# Patient Record
Sex: Male | Born: 1945
Health system: Southern US, Community
[De-identification: ages and names within clinical notes are randomized; demographics above are authoritative.]

## PROBLEM LIST (undated history)

## (undated) DIAGNOSIS — E039 Hypothyroidism, unspecified: Secondary | ICD-10-CM

## (undated) DIAGNOSIS — R519 Headache, unspecified: Secondary | ICD-10-CM

## (undated) DIAGNOSIS — G8929 Other chronic pain: Secondary | ICD-10-CM

## (undated) DIAGNOSIS — I509 Heart failure, unspecified: Secondary | ICD-10-CM

## (undated) DIAGNOSIS — G4733 Obstructive sleep apnea (adult) (pediatric): Secondary | ICD-10-CM

## (undated) DIAGNOSIS — M1990 Unspecified osteoarthritis, unspecified site: Secondary | ICD-10-CM

## (undated) DIAGNOSIS — F32A Depression, unspecified: Secondary | ICD-10-CM

## (undated) DIAGNOSIS — F329 Major depressive disorder, single episode, unspecified: Secondary | ICD-10-CM

## (undated) DIAGNOSIS — I2510 Atherosclerotic heart disease of native coronary artery without angina pectoris: Secondary | ICD-10-CM

## (undated) DIAGNOSIS — I10 Essential (primary) hypertension: Secondary | ICD-10-CM

## (undated) DIAGNOSIS — R51 Headache: Secondary | ICD-10-CM

## (undated) DIAGNOSIS — J189 Pneumonia, unspecified organism: Secondary | ICD-10-CM

## (undated) DIAGNOSIS — M545 Low back pain, unspecified: Secondary | ICD-10-CM

## (undated) DIAGNOSIS — S32000A Wedge compression fracture of unspecified lumbar vertebra, initial encounter for closed fracture: Secondary | ICD-10-CM

## (undated) DIAGNOSIS — E119 Type 2 diabetes mellitus without complications: Secondary | ICD-10-CM

## (undated) DIAGNOSIS — E7800 Pure hypercholesterolemia, unspecified: Secondary | ICD-10-CM

## (undated) DIAGNOSIS — K219 Gastro-esophageal reflux disease without esophagitis: Secondary | ICD-10-CM

## (undated) HISTORY — PX: CATARACT EXTRACTION W/ INTRAOCULAR LENS IMPLANT: SHX1309

## (undated) HISTORY — PX: APPENDECTOMY: SHX54

## (undated) HISTORY — PX: SPHINCTEROTOMY: SHX5279

## (undated) HISTORY — PX: TONSILLECTOMY AND ADENOIDECTOMY: SUR1326

## (undated) HISTORY — PX: EYE MUSCLE SURGERY: SHX370

## (undated) HISTORY — PX: CARDIAC CATHETERIZATION: SHX172

---

## 1969-01-10 HISTORY — PX: MASTOIDECTOMY: SHX711

## 1979-01-11 HISTORY — PX: CHOLECYSTECTOMY: SHX55

## 1999-03-11 ENCOUNTER — Ambulatory Visit (HOSPITAL_COMMUNITY): Admission: RE | Admit: 1999-03-11 | Discharge: 1999-03-11 | Payer: Self-pay | Admitting: *Deleted

## 1999-12-25 ENCOUNTER — Observation Stay (HOSPITAL_COMMUNITY): Admission: EM | Admit: 1999-12-25 | Discharge: 1999-12-26 | Payer: Self-pay | Admitting: Internal Medicine

## 1999-12-26 ENCOUNTER — Encounter: Payer: Self-pay | Admitting: *Deleted

## 1999-12-26 ENCOUNTER — Encounter: Payer: Self-pay | Admitting: Internal Medicine

## 2001-05-30 ENCOUNTER — Ambulatory Visit (HOSPITAL_BASED_OUTPATIENT_CLINIC_OR_DEPARTMENT_OTHER): Admission: RE | Admit: 2001-05-30 | Discharge: 2001-05-30 | Payer: Self-pay | Admitting: Family Medicine

## 2005-10-11 ENCOUNTER — Inpatient Hospital Stay (HOSPITAL_COMMUNITY): Admission: EM | Admit: 2005-10-11 | Discharge: 2005-10-14 | Payer: Self-pay | Admitting: Emergency Medicine

## 2007-12-24 ENCOUNTER — Ambulatory Visit: Payer: Self-pay | Admitting: Family Medicine

## 2007-12-24 ENCOUNTER — Ambulatory Visit: Payer: Self-pay | Admitting: Cardiology

## 2007-12-24 ENCOUNTER — Inpatient Hospital Stay (HOSPITAL_COMMUNITY): Admission: EM | Admit: 2007-12-24 | Discharge: 2007-12-27 | Payer: Self-pay | Admitting: Emergency Medicine

## 2008-04-03 ENCOUNTER — Telehealth: Payer: Self-pay | Admitting: *Deleted

## 2008-09-11 ENCOUNTER — Encounter: Admission: RE | Admit: 2008-09-11 | Discharge: 2008-09-11 | Payer: Self-pay | Admitting: Family Medicine

## 2010-05-24 ENCOUNTER — Ambulatory Visit (HOSPITAL_COMMUNITY)
Admission: RE | Admit: 2010-05-24 | Discharge: 2010-05-24 | Payer: Self-pay | Source: Home / Self Care | Attending: Specialist | Admitting: Specialist

## 2010-05-27 LAB — CBC
HCT: 37.2 % — ABNORMAL LOW (ref 39.0–52.0)
Hemoglobin: 13.2 g/dL (ref 13.0–17.0)
MCH: 31 pg (ref 26.0–34.0)
MCHC: 35.5 g/dL (ref 30.0–36.0)
MCV: 87.3 fL (ref 78.0–100.0)
Platelets: 233 10*3/uL (ref 150–400)
RBC: 4.26 MIL/uL (ref 4.22–5.81)
RDW: 13.3 % (ref 11.5–15.5)
WBC: 6.6 10*3/uL (ref 4.0–10.5)

## 2010-05-27 LAB — BASIC METABOLIC PANEL
BUN: 12 mg/dL (ref 6–23)
CO2: 27 mEq/L (ref 19–32)
Calcium: 10 mg/dL (ref 8.4–10.5)
Chloride: 102 mEq/L (ref 96–112)
Creatinine, Ser: 1.27 mg/dL (ref 0.4–1.5)
GFR calc Af Amer: >60 mL/min (ref >60)
GFR calc non Af Amer: 57 mL/min — ABNORMAL LOW (ref >60)
Glucose, Bld: 121 mg/dL — ABNORMAL HIGH (ref 70–99)
Potassium: 4.1 mEq/L (ref 3.5–5.1)
Sodium: 138 mEq/L (ref 135–145)

## 2010-07-08 ENCOUNTER — Encounter (HOSPITAL_COMMUNITY): Payer: Worker's Compensation | Attending: Specialist

## 2010-07-08 ENCOUNTER — Other Ambulatory Visit: Payer: Self-pay | Admitting: Specialist

## 2010-07-08 DIAGNOSIS — Z01812 Encounter for preprocedural laboratory examination: Secondary | ICD-10-CM | POA: Insufficient documentation

## 2010-07-08 LAB — BASIC METABOLIC PANEL
BUN: 10 mg/dL (ref 6–23)
CO2: 28 mEq/L (ref 19–32)
Calcium: 9.7 mg/dL (ref 8.4–10.5)
Chloride: 102 mEq/L (ref 96–112)
Creatinine, Ser: 1.1 mg/dL (ref 0.4–1.5)
GFR calc Af Amer: >60 mL/min (ref >60)
GFR calc non Af Amer: >60 mL/min (ref >60)
Glucose, Bld: 233 mg/dL — ABNORMAL HIGH (ref 70–99)
Potassium: 3.8 mEq/L (ref 3.5–5.1)
Sodium: 139 mEq/L (ref 135–145)

## 2010-07-08 LAB — CBC
HCT: 44.4 % (ref 39.0–52.0)
Hemoglobin: 15.2 g/dL (ref 13.0–17.0)
MCH: 30 pg (ref 26.0–34.0)
MCHC: 34.2 g/dL (ref 30.0–36.0)
MCV: 87.7 fL (ref 78.0–100.0)
Platelets: 202 10*3/uL (ref 150–400)
RBC: 5.06 MIL/uL (ref 4.22–5.81)
RDW: 13.3 % (ref 11.5–15.5)
WBC: 5.3 10*3/uL (ref 4.0–10.5)

## 2010-07-11 ENCOUNTER — Observation Stay (HOSPITAL_COMMUNITY)
Admission: RE | Admit: 2010-07-11 | Discharge: 2010-07-12 | Disposition: A | Discharge status: Home / Self Care | Payer: Worker's Compensation | Source: Ambulatory Visit | Attending: Specialist | Admitting: Specialist

## 2010-07-11 DIAGNOSIS — M719 Bursopathy, unspecified: Principal | ICD-10-CM | POA: Insufficient documentation

## 2010-07-11 DIAGNOSIS — I2510 Atherosclerotic heart disease of native coronary artery without angina pectoris: Secondary | ICD-10-CM | POA: Insufficient documentation

## 2010-07-11 DIAGNOSIS — M25819 Other specified joint disorders, unspecified shoulder: Secondary | ICD-10-CM | POA: Insufficient documentation

## 2010-07-11 DIAGNOSIS — I10 Essential (primary) hypertension: Secondary | ICD-10-CM | POA: Insufficient documentation

## 2010-07-11 DIAGNOSIS — E669 Obesity, unspecified: Secondary | ICD-10-CM | POA: Insufficient documentation

## 2010-07-11 DIAGNOSIS — K219 Gastro-esophageal reflux disease without esophagitis: Secondary | ICD-10-CM | POA: Insufficient documentation

## 2010-07-11 DIAGNOSIS — M24119 Other articular cartilage disorders, unspecified shoulder: Secondary | ICD-10-CM | POA: Insufficient documentation

## 2010-07-11 DIAGNOSIS — M67919 Unspecified disorder of synovium and tendon, unspecified shoulder: Principal | ICD-10-CM | POA: Insufficient documentation

## 2010-07-11 DIAGNOSIS — M7500 Adhesive capsulitis of unspecified shoulder: Secondary | ICD-10-CM | POA: Insufficient documentation

## 2010-07-11 HISTORY — PX: SHOULDER OPEN ROTATOR CUFF REPAIR: SHX2407

## 2010-07-11 LAB — GLUCOSE, CAPILLARY
Glucose-Capillary: 268 mg/dL — ABNORMAL HIGH (ref 70–99)
Glucose-Capillary: 240 mg/dL — ABNORMAL HIGH (ref 70–99)
Glucose-Capillary: 209 mg/dL — ABNORMAL HIGH (ref 70–99)
Glucose-Capillary: 208 mg/dL — ABNORMAL HIGH (ref 70–99)
Glucose-Capillary: 150 mg/dL — ABNORMAL HIGH (ref 70–99)

## 2010-07-12 LAB — CBC
HCT: 41.7 % (ref 39.0–52.0)
Hemoglobin: 14.2 g/dL (ref 13.0–17.0)
MCH: 30.1 pg (ref 26.0–34.0)
MCHC: 34.1 g/dL (ref 30.0–36.0)
MCV: 88.5 fL (ref 78.0–100.0)
Platelets: 164 10*3/uL (ref 150–400)
RBC: 4.71 MIL/uL (ref 4.22–5.81)
RDW: 13.2 % (ref 11.5–15.5)
WBC: 9.8 10*3/uL (ref 4.0–10.5)

## 2010-07-12 LAB — BASIC METABOLIC PANEL
BUN: 12 mg/dL (ref 6–23)
CO2: 26 mEq/L (ref 19–32)
Calcium: 8.8 mg/dL (ref 8.4–10.5)
Chloride: 103 mEq/L (ref 96–112)
Creatinine, Ser: 1.14 mg/dL (ref 0.4–1.5)
GFR calc Af Amer: >60 mL/min (ref >60)
GFR calc non Af Amer: >60 mL/min (ref >60)
Glucose, Bld: 181 mg/dL — ABNORMAL HIGH (ref 70–99)
Potassium: 4 mEq/L (ref 3.5–5.1)
Sodium: 135 mEq/L (ref 135–145)

## 2010-07-12 LAB — GLUCOSE, CAPILLARY: Glucose-Capillary: 161 mg/dL — ABNORMAL HIGH (ref 70–99)

## 2010-07-19 NOTE — Op Note (Signed)
NAME:  Joshua Lara, Joshua Lara               ACCOUNT NO.:  [id number]  MEDICAL RECORD NO.:  [id number]           PATIENT TYPE:  O  LOCATION:  DAYL                         FACILITY:  [facility]  PHYSICIAN:  Jene Every, M.D.    DATE OF BIRTH:  May 19, 1945  DATE OF PROCEDURE:  07/11/2010 DATE OF DISCHARGE:                              OPERATIVE REPORT   PREOPERATIVE DIAGNOSIS:  Rotator cuff tear and impingement syndrome, left shoulder.  POSTOPERATIVE DIAGNOSES: 1. Rotator cuff tear and impingement syndrome, left shoulder. 2. Adhesive capsulitis.  PROCEDURES PERFORMED: 1. Exam under anesthesia followed by manipulation under anesthesia. 2. Left shoulder arthroscopy and debridement of anterior labral tear. 3. Subacromial decompression with mini open rotator cuff repair     utilizing Mitek suture anchors and push locks.  ANESTHESIA:  General.  ASSISTANT:  Roma Schanz, P.A.  BRIEF HISTORY:  A 65 year old with rotator cuff tear, acquired clearance, indicated for surgical intervention for repair.  Risk and benefits discussed including bleeding, infection, damage to vascular structures, no change in symptoms, worsening in symptoms, need for repeat debridement, DVT, PE, and anesthetic complications, etc.  TECHNIQUE:  The patient in supine position; after induction of adequate general esthesia and 2 g Kefzol and 600 clindamycin, the left shoulder and upper extremity was prepped and draped in usual sterile fashion. After exam under anesthesia revealed restricted forward flexion, we manipulated from 120 degrees to 140 degrees with appreciation of lysis of adhesions which was performed gently.  Abduction and internal- external rotation were unremarkable.  Surgical marker was utilized to delineate acromion and AC joint coracoid.  With the arm in 70/30 position with gentle traction, we advanced the arthroscopic cannula in the glenohumeral joint penetrating atraumatically.  The anterior  portal was then fashioned, localized with an 18 gauge needle anteriorly just beneath the biceps tendon in the glenohumeral joint.  Small incision was made there.  We advanced the cannula into the glenohumeral joint. Inspection of the glenohumeral joint revealed tearing of the anterior labrum; however, it was intact.  The biceps tendon was intact.  There appeared to be a full-thickness tear from the rotator cuff.  Mild degenerative changes of glenohumeral joint.  Subscap was unremarkable as well.  Labrum was probed.  It was attached.  Introduced a shaver and debrided the labrum to a stable base.  Following this, we redirected the camera in the subacromial space triangulating with an anterolateral cannula.  Exuberant hypertrophic bursa was noted.  We therefore introduced a shaver and performed a bursectomy.  CA ligament was divided utilizing the ArthroWand and debriding the anterolateral aspect of the acromion.  Full-thickness tear was identified.  We converted therefore to a mini open procedure.  Arthroscopic camera and equipment was removed.  Portals were closed with 4-0 nylon simple suture.  A small 2 cm incision was made over the anterolateral aspect of the acromion. Self-retaining retractors were placed.  Marcaine with epinephrine was infiltrated in the joint.  We developed a raphe between the anterolateral heads of the acromion.  Self-retaining retractor was then placed.  We preserved the attachment of the deltoid, subperiosteal elevation with the AO elevator.  Noted was a full-thickness tear and the rotator cuff retracted a centimeter.  We debrided the edges, mobilized the rotator cuff beneath it at the glenoid and between that and the acromion with mobilization of the arm at its side to the lateral aspect of the greater tuberosity.  A Baer rongeur was utilized to fashion a bone trough to good bleeding bone just lateral to the articular surface medial to the greater tuberosity.  Then  with the arm slightly abducted, we placed 2 Mitek suture anchors in this trough.  This was just at the lateral margin of the articular surface guided through the supraspinatus tendon which was torn and retracted.  Surgical knot utilized as we threaded it through the tendon advancing into the greater tuberosity. Following this, the leading edges of the suture were crossed and secured over the greater tuberosity with 2 push locks that were inserted after utilization of an awl threading the Ethibond suture and impacting it to the greater tuberosity.  Excellent purchase was noted and full coverage of the tendon and repair to the bony bed was achieved.  Watertight closure was noted.  We then attained the remainder of the cuff nor evidence of tear.  We digitally lysed subacromial adhesions.  Residual bursectomy was performed as well.  This was performed previously with the arthroscope, as it was released into the CA ligament.  Next, wound copiously irrigated with antibiotic irrigation.  The raphe with #1 Vicryl interrupted figure-of-8 sutures, subcutaneous with 2-0 Vicryl simple sutures.  Skin was reapproximated with 4-0 subcuticular Prolene.Wound reinforced with Steri-Strips.  Sterile dressing applied, placed an abduction pillow, extubated without difficulty, transported to recovery in satisfactory condition.  The patient tolerated procedure well.  No complications.  ESTIMATED BLOOD LOSS:  Minimal.     Jene Every, M.D.     Cordelia Pen  D:  07/11/2010  T:  07/11/2010  Job:  [id number]  Electronically Signed by Jene Every M.D. on 07/19/2010 05:50:04 AM

## 2010-09-09 ENCOUNTER — Other Ambulatory Visit: Payer: Self-pay | Admitting: Physician Assistant

## 2010-09-09 ENCOUNTER — Ambulatory Visit
Admission: RE | Admit: 2010-09-09 | Discharge: 2010-09-09 | Disposition: A | Discharge status: Home / Self Care | Payer: Worker's Compensation | Source: Ambulatory Visit | Attending: Physician Assistant | Admitting: Physician Assistant

## 2010-09-09 DIAGNOSIS — R609 Edema, unspecified: Secondary | ICD-10-CM

## 2010-09-09 DIAGNOSIS — R52 Pain, unspecified: Secondary | ICD-10-CM

## 2010-09-24 NOTE — Consult Note (Signed)
NAME:  ADEN, SEK               ACCOUNT NO.:  [id number]   MEDICAL RECORD NO.:  [id number]          PATIENT TYPE:  INP   LOCATION:  2001                         FACILITY:  [facility]   PHYSICIAN:  Jesse Sans. Wall, MD, FACCDATE OF BIRTH:  1945/12/22   DATE OF CONSULTATION:  DATE OF DISCHARGE:                                 CONSULTATION   We were asked by the [facility] Teaching Service, specifically Dr.  Burnadette Pop to consult on Joshua Lara with exertional chest discomfort.   Joshua Lara is 65 years of age, married white male with history of nonobstructive  disease by cardiac catheterization in 2001.   Over the last week, Joshua L[place] has been having exertional chest tightness going  up into Joshua [place]ara left shoulder down Joshua [place]ara left arm to [place] elbow.  It is  relieved with rest.  Josh[place] also has some sharp pain off and on that  radiates back-and-forth from the left side of Joshua Lara chest to the right  side of Joshua [place]ara chest.  Josh[place] has had no prolonged pain.  Josh[place] does get low short  of breath and sweaty.  Pain is relieved by rest.   Joshua Lara has no known drug allergies.   Joshua Lara is currently on  1. Cymbalta 120 mg a day.  2. Testosterone monthly.  3. Ramipril 10 mg a day.  4. Janumet 50/1000 p.o. b.i.d.  5. Vytorin 10/40 p.o. daily.  6. Lovaza 1000 mg daily.  7. Lyrica 200 mg daily.  8. Flomax 0.4 mg a day.  9. Hydrochlorothiazide 25 mg a day.  10.Aspirin 81 mg a day.   Joshua Lara past medical history includes  1. Type 2 diabetes.  2. Mixed hyperlipidemia.  3. Hypertension.  4. Tobacco use which Joshua Lara quit 18 years ago.  5. Glaucoma.  6. BPH.   PAST SURGICAL HISTORY:  Appendectomy, cholecystectomy, tonsillectomy,  glaucoma surgery.   SOCIAL HISTORY:  [place] lives in [place] with [place].  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  Joshua Lara has two children and several grandchildren.  Joshua [place] enjoys  riding [place] [employer].  Joshua [place]ara enjoys yard work.   FAMILY HISTORY:  Joshua [place]ara mother had heart disease.  Joshua [place] father died of an MI  at age 74.  There maybe a hidden  history in [place] brother.   [place] review of systems other than HPI, Joshua [place]ara has chronic dyspnea on  exertion.  Joshua Lara has some bright red blood per rectum which [place] thinks is  from hemorrhoids.   The rest of the review of systems were questioned and were negative.   PHYSICAL EXAMINATION:  VITAL SIGNS:  Joshua [place]ara blood pressure is currently  120/80, [place] pulse is 83 and regular, [place] temperature is 98.9, sats 90%  on 4 L.  GENERAL:  Joshua [place] is an overweight gentleman in no acute distress.  Josh[place] is  fairly jocular, has a pleasant affect.  [place] is alert and oriented x3.  [place] is in the room.  HEENT:  Extraocular movements intact.  PERRLA.  Sclerae clear.  Josh[place] has  some false teeth.  Facial symmetry is normal.  NECK:  Supple.  No JVD.  Good carotid upstrokes without bruits.  CARDIOVASCULAR:  Regular rate and rhythm without murmur, rub or gallop.  Joshua [place]ara has distant S1-S2.  LUNGS:  Clear to auscultation and percussion.  ABDOMEN:  Obese, soft, nontender, good bowel sounds and no  hepatosplenomegaly.  EXTREMITIES:  No cyanosis, clubbing or edema.  Pulses were 2+/ 4+ both  pedal and radial pulses.  NEURO:  Grossly intact.   LABORATORY DATA:  [place] hemoglobin is 15.3, D-dimer was less than 0.22,  point of care markers were negative x2.  Chest x-ray shows some mild  cardiomegaly.  A CT angio, no pulmonary embolus.  Joshua [place]ara potassium is 3.8  and creatinine 0.96.   EKG shows sinus rhythm and right axis deviation.  No ST-segment changes.  No change since 2001.   ASSESSMENT AND PLAN:  1. Exertional angina.  2. Hyperlipidemia.  3. Hypertension.  4. Type 2 diabetes.  5. Obesity.  6. Remote tobacco.   RECOMMENDATIONS:  1. Intravenous heparin.  2. Aspirin.  3. Continue statin.  4. Beta blockade.  5. Telemetry.  6. Cardiac markers.  7. Cath Monday.   Indications, risks, and potential benefits have been discussed.  [place]  agrees to proceed.      Thomas C. Daleen Squibb, MD, [facility]  Electronically Signed     TCW/MEDQ  D:   12/24/2007  T:  12/25/2007  Job:  [id number]   cc:   Lianne Bushy, M.D.

## 2010-09-24 NOTE — Discharge Summary (Signed)
NAME:  Joshua Lara, Joshua Lara               ACCOUNT NO.:  [id number]   MEDICAL RECORD NO.:  [id number]          PATIENT TYPE:  INP   LOCATION:  2001                         FACILITY:  [facility]   PHYSICIAN:  Wayne A. Sheffield Lara, M.D.    DATE OF BIRTH:  November 23, 1945   DATE OF ADMISSION:  12/24/2007  DATE OF DISCHARGE:  12/27/2007                               DISCHARGE SUMMARY   PRIMARY CARE Mirren Gest:  Lianne Bushy, MD   DISCHARGE DIAGNOSES:  1. Coronary artery disease.  2. Diabetes mellitus type 2.  3. Hyperlipidemia.  4. Hypertension.  5. Glaucoma.  6. Benign prostatic hypertrophy.  7. History of tobacco abuse.   DISCHARGE MEDICATIONS:  1. Omeprazole 20 mg 1 tab by mouth daily.  2. Aspirin 81 mg 1 tab by mouth daily.  3. Coreg 3.125 mg 1 tab by mouth twice a day with meals.  4. Cymbalta 60 mg 1 tab by mouth twice a day.  5. Vytorin 10/40 mg 1 tab by mouth daily.  6. Hydrochlorothiazide 25 mg 1 tab by mouth daily.  7. Lovaza 1g 1 tab by mouth daily.  8. Lyrica 50 mg 1 tab by mouth 4 times a day.  9. Ramipril 10 mg 1 tab by mouth once at night.  10.Janumet 50/1000 mg 1 tab by mouth twice a day (do not take for the      next 2 days).  11.Flomax 0.4 mg 1 tab by mouth once at night.   CONSULT:  Cardiology.   PROCEDURES:  1. EKG:  Right axis deviation, no ST- or T-wave changes.  2. Chest x-ray:  Mild cardiomegaly.  No edema.  3. CT angiogram:  No pulmonary emboli.  4. Cardiac cath:  40% stenosis of the LAD.   LABS:  1. D-dimer less than 0.22.  2. Hemoglobin A1c 6%.  3. Fasting lipid profile, cholesterol 105, triglycerides 167, HDL 36,      LDL 36.  4. Cardiac enzymes negative x2.   BRIEF HOSPITAL COURSE:  The patient is a 65 year old male with multiple  risk factors admitted for chest pain and a rule out MI.  1. Chest pain:  The patient has a good story for coronary artery      disease who complained of substernal chest pain radiating down to      the left arm, that is brought down with  exertion, relieved by rest,      and relieved with nitroglycerin.  EKG showed only right axis      deviation, but no ST or T-wave changes.  Cardiology was consulted      who felt that this patient was at high enough risk for coronary      artery disease and would benefit from a cardiac cath.  After being      admitted, the patient did well.  He complained of no further      episodes of chest pain.  Cardiac cath revealed 40% stenosis at the      mid LAD, but Cardiology did not feel it was amenable to      intervention.  Cardiology recommended  medical management and      primary risk reduction.  The patient was started on Coreg, and was      also given aspirin, ramipril, and Vytorin.  2. Hypertension:  This was well controlled in the hospital.  The      patient was continued on his home medications and started on Coreg.  3. Diabetes mellitus type 2:  The patient's blood sugar is well      controlled in the hospital.  The patient was given Januvia with      sliding-scale insulin.  On discharge, the patient was told to      restart Janumet, but it hold it for 48 hours after the cath.  4. Disposition:  The patient did well in the hospital and after      cardiac cath was determined that there would be no intervention and      only medical management.  The patient was ready for discharge.  On      the day of discharge, the patient was not complaining of any      further chest pain and was agreeable in a complete understanding      with discharge.   DISCHARGE INSTRUCTIONS:  The patient is to eat a low-sodium, heart-  healthy diet.  The patient is to increase activity slowly.   FOLLOWUP APPOINTMENTS:  The patient is to return to Dr. Purnell Shoemaker, phone  #[phone] on January 10, 2008, at 10:15 a.m.  The patient is also to  follow up with [facility] Cardiology and is to call and schedule an  appointment.   DISCHARGE CONDITION:  The patient was discharged to home in stable  medical condition.       Angelena Sole, MD  Electronically Signed      [place], M.D.  Electronically Signed    WS/MEDQ  D:  12/27/2007  T:  12/28/2007  Job:  [id number]

## 2010-09-24 NOTE — H&P (Signed)
NAME:  Joshua Lara, Joshua Lara               ACCOUNT NO.:  [id number]   MEDICAL RECORD NO.:  [id number]          PATIENT TYPE:  EMS   LOCATION:  MAJO                         FACILITY:  [facility]   PHYSICIAN:  Wayne A. Sheffield Slider, M.D.    DATE OF BIRTH:  09-10-1945   DATE OF ADMISSION:  12/24/2007  DATE OF DISCHARGE:                              HISTORY & PHYSICAL   PRIMARY CARE PHYSICIAN:  Lianne Bushy, M.D.   CHIEF COMPLAINT:  Chest pain, radiating arm pain.   HISTORY OF PRESENT ILLNESS:  A 65 year old male with significant risk  factors for coronary artery disease who presents to [facility] ED with  complaint of radiating left arm pain and chest discomfort that has been  worsening over the past week.  He is brought on with exertion, began as  sharp pain in the left shoulder and radiates to the elbow, and then  crossed the chest to the right arm.  It lasts for few minutes less than  30 minutes, relieved by rest.  Associated with shortness of breath and  diaphoresis.  No nausea or vomiting.  He was given nitro in the ED that  brought his pain down from a 8/10 to 1/10.   PAST MEDICAL HISTORY:  1. Diabetes, type 2.  2. Hyperlipidemia.  3. Hypertension.  4. Tobacco abuse.  He did quit 18 years ago.  5. Glaucoma.  6. BPH.   Please note that he had a cardiac cath in 2001 and had normal coronaries  and ejection fraction of 50%.   PAST SURGICAL HISTORY:  1. Appendicitis.  2. Cholecystectomy.  3. Tonsillectomy.  4. Glaucoma surgery.   ALLERGIES:  No known drug allergies.   MEDICATIONS:  1. Cymbalta 60 mg 2 tablets p.o. daily.  2. Testosterone monthly.  3. Ramipril 10 mg p.o. daily.  4. Janumet 50/1000 one tablet p.o. b.i.d.  5. Vytorin 10/40 one tablet p.o. daily.  6. Lovaza 1000 mg daily.  7. Lyrica 200 mg daily.  He takes less than 4 tablets of 50 mg.  8. Flomax 0.4 mg daily.  9. Hydrochlorothiazide 25 mg p.o. daily.  10.Aspirin 81 mg p.o. daily.   SOCIAL HISTORY:  Lives with his wife  in [place].  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  He does have a  smoking exposure, his wife is a smoker.  He rarely drinks alcohol and  has no drug abuse.   FAMILY HISTORY:  Mother has heart disease.  Father died at the age of 50  from MI and he thinks that he has a brother who also had MI in the past,  but is uncertain.   REVIEW OF SYSTEMS:  Denies any fevers, chills, edema, sputum production,  hemoptysis, nausea, vomiting, diarrhea, dysuria, hematuria, or vision  changes.  He endorses diaphoresis, chest pain, cough, dyspnea, bright  red blood per rectum at times and that is felt is occurred by  hemorrhoids and he also endorses some weakness.   PHYSICAL EXAMINATION:  VITAL SIGNS:  Temperature is 98.9, pulse 83,  blood pressure  98/48, he sats 98% on 4 liters, breathing 23 times a  minute.  GENERAL:  Obese white male in no apparent distress.  HEENT:  Extraocular movements intact.  Pupils equal, round, and reactive  to light and accommodation.  False dentures in place.  NECK:  No JVD.  No bruits.  CARDIOVASCULAR:  Regular rate and rhythm.  No murmurs, rubs, or gallops.  Distinct S1 and S2.  No abdominal bruits.  LUNGS:  Clear to auscultation bilaterally.  ABDOMEN:  Obese, soft, nontender, and nondistended.  Positive bowel  sounds.  No hepatosplenomegaly.  EXTREMITIES:  No edema, 2+ pedal/radial pulses.  NEURO:  Cranial nerves II-XII grossly intact, 5/5 strength in all  extremities and normal sensation.   LABS AND STUDIES:  CBC were within normal limits with a hemoglobin of  15.2, white blood cell count 8.5, and platelets 217.  Sodium 136,  potassium 3.8, creatinine 0.96, and glucose of 91.  He had a D-dimer  that was less than 0.22 with a CT angiogram that was negative for Pes.  Point of care enzymes were negative x2.  EKG showed normal sinus rhythm,  right axis deviation, no ST or T wave changes compared to EKG done to in  2001.  Chest x-ray  did show some mild cardiomegaly but no edema.   ASSESSMENT/PLAN:  This is a 65 year old white male with atypical chest  pain admitted for rule out acute coronary syndrome.  1. Chest pain.  Atypical in description but could very well be acute      coronary syndrome given his risk factors and worsened with exertion      improved with less and nitro.  His EKG and point of care enzymes      were negative.   PLAN:  Our plan is to cycle his cardiac enzymes, obtain EKG in the  morning, place on telemetry, obtain fasting lipid panels, continue on  home aspirin at a higher dose, continue his home hydrochlorothiazide and  ramipril, we will add a beta blocker provide O2 and nitroglycerin and  morphine as needed.  We will rule out a PE with a negative CT angio.  Chest x-ray was negative for dissection or infectious process.  We will  place him on PPI and cardiology has been consulted per [place].  1. Hypertension.  We will continue on his home medicine, add a beta      blocker for mortality benefit.  We will hold for systolic blood      pressure less than 100.  2. Hyperlipidemia.  Currently, he is on Vytorin, we will continue with      that medication and check his fasting lipid panel.  3. Diabetes, type 2.  We will continue him on Janumet, place him on a      sliding scale insulin moderate scale, and check A1c.  4. Benign prostatic hypertrophy, will continue on the Flomax, but we      will hold if his systolic blood pressures are less than 100 or if      he is symptomatic.  5. Prophylaxis.  We encouraged him to ambulate, place him on proton      pump inhibitor.  We will give him pain control with home meds and      also Tylenol.  6. Fluids, electrolytes, nutrition.  We will place him on IV fluids      and normal saline at 100 per hour.  We will place him on n.p.o. for  now until he is seen by Cardiology.  7. Disposition.  He is a full code.  Place him on op status and await      cardiology  recommendations.      Marisue Ivan, MD  Electronically Signed      Arnette Norris. Sheffield Slider, M.D.  Electronically Signed    KL/MEDQ  D:  12/24/2007  T:  12/25/2007  Job:  [id number]

## 2010-09-24 NOTE — Cardiovascular Report (Signed)
NAME:  Joshua Lara, Joshua Lara NO.:  [id number]   MEDICAL RECORD NO.:  [id number]          PATIENT TYPE:  INP   LOCATION:  2001                         FACILITY:  [facility]   PHYSICIAN:  Rollene Rotunda, MD, FACCDATE OF BIRTH:  [birth year]   DATE OF PROCEDURE:  12/27/2007  DATE OF DISCHARGE:                            CARDIAC CATHETERIZATION   PRIMARY CARE PHYSICIAN:  Lianne Bushy, MD.   PROCEDURE:  Left heart catheterization/coronary arteriography.   INDICATIONS:  The patient with chest pain and multiple cardiovascular  risk factors (411.1).   PROCEDURE NOTE:  Left heart catheterization performed via the right  femoral artery.  The aorta was cannulated using the anterior wall  puncture and a #6-French arterial sheath was inserted via the modified  Seldinger technique.  Preformed Judkins and pigtail catheter were  utilized.  The patient tolerated procedure well and left the lab in  stable condition.   RESULTS:  Hemodynamics:  LV 117/12, AO 118/75.  Coronaries, left main  was normal.  The LAD had mild diffuse luminal irregularities.  There is  mid long 40% stenosis.  There was a large vessel wrapping the apex.  Circumflex in the AV groove was normal.  There was a ramus intermediate,  which was large and normal.  First obtuse marginal was large branching  and normal.  The right coronary artery is dominant vessel.  There is  diffuse luminal irregularities.  PDA was large and normal.  Posterolateral was small and normal.  Left ventriculogram:  The left ventriculogram was obtained in the RAO  projection.  The EF was 55% with normal wall motion.   CONCLUSION:  Coronary artery:  Mild coronary artery plaque.  Low normal  left ventricular ejection fraction.   PLAN:  Based on the above, no further cardiovascular testing is  suggested.  He needs aggressive risk reduction given the 40% LAD plaque.      Rollene Rotunda, MD, [facility]  Electronically Signed     JH/MEDQ  D:   12/27/2007  T:  12/28/2007  Job:  [id number]   cc:   Lianne Bushy, M.D.
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
## 2010-09-27 NOTE — Discharge Summary (Signed)
[place]. [facility]  Patient:    RANDLE, SHATZER                        MRN: [id number] Adm. Date:  12/25/99 Disc. Date: 12/26/99 Attending:  Nathen May, M.D., [facility] Dictator:   Rozell Searing, P.A. CC:         Maricela Bo, M.D.                           Discharge Summary  PROCEDURE:  Cardiac catheterization, December 26, 1999.  Ventilation/perfusion scan December 26, 1999.  REASON FOR ADMISSION:  Mr. Amescua is a 65 year old male with no previous history of heart disease, and with cardiac risk factors notable for family history and obesity, who was referred to our office for cardiac evaluation by Dr. Placido Sou.  The patient was seen by Dr. Odessa Fleming who diagnosed the patient with typical/atypical chest discomfort and recommended proceeding with direct admission for rule out myocardial infarction and further diagnostic evaluation by way of coronary angiography.  Please refer to dictated admission note for full details.  LABORATORY DATA:  Room air ABG; pH 7.42, pCO2 38, pO2 83, HCO3 24.  Metabolic profile normal.  Elevated SGOT at 57.  Cardiac enzymes; CPK 287/1.7, 269/1.6. Troponin I; 0.05, 0.03.  CBC within normal limits.  D-dimer 0.37.  Admission chest x-ray; NAD.  HOSPITAL COURSE:  Following admission, the patient ruled out for MI with negative serial cardiac enzymes.  The patient was maintained on aspirin, long-acting nitrates, beta blocker, and low molecular weight heparin in preparation for diagnostic coronary angiogram.  Cardiac catheterization performed December 26, 1999, by Dr. Ephraim Hamburger, revealed normal coronary arteries and normal left ventricle.  There was, however, indication of slow flow down the LAD.  Dr. Juanda Chance recommended with proceeding with follow-up ventilation/perfusion scan to rule out pulmonary embolus.  This was interpreted as normal.  As previously noted, D-dimer was also within normal range.  MEDICATIONS ADJUSTMENTS:   Initiation of low dose aspirin on a daily basis. The patient is otherwise to resume previous home medication regimen.  DISCHARGE MEDICATIONS: 1. Aspirin 81 mg q.d. 2. Eye drops as previously directed. 3. BuSpar 10 mg b.i.d. 4. Prevacid 30 mg q.d. as needed.  DISCHARGE INSTRUCTIONS:  The patient is to refrain from any strenuous activity/driving x 2 days.  He is to call the office if there is any swelling/bleeding of the groin.  He is to maintain a low fat/cholesterol diet.  FOLLOW-UP:  The patient is instructed to schedule a follow-up appointment with his primary care physician, Dr. Kathrynn Humble, in the ensuing one-two weeks.  DISCHARGE DIAGNOSES: 1. Persistent chest pain - undetermined etiology.    a. Cardiac catheterization, December 26, 1999; normal coronary arteries with       decreased left anterior descending flow; normal left ventricle.    b. Normal ventilation/perfusion scan. 2. Obesity. 3. Remote tobacco. 4. Family history of coronary artery disease. DD:  12/26/99 TD:  12/27/99 Job: [id number]

## 2010-09-27 NOTE — Cardiovascular Report (Signed)
[place]. [facility]  Patient:    Joshua Lara, Joshua Lara                      MRN: [id number] Proc. Date: 12/26/99 Adm. Date:  [id number] Disc. Date: [id number] Attending:  Nathen May CC:         Maricela Bo, M.D.             Nathen May, M.D., [facility]             [facility]                        Cardiac Catheterization  CLINICAL HISTORY:  Mr. Hellstrom is 65 years old and recently developed the onset of substernal chest pain which was sometimes persistent for a few minutes and sometimes sharp.  He also had some symptoms of dyspnea on exertion. Dr. Maricela Bo referred him to Dr. Nathen May, who saw him in the office, and admitted him for further evaluation.  DESCRIPTION OF PROCEDURE:  The procedure was performed via the right femoral artery using arterial sheath and 6-French preformed coronary catheters.  A femoral arterial puncture was performed and Omnipaque contrast was used.  At the end of the procedure, the right femoral artery was closed with Perclose. The patient tolerated the procedure well and left the laboratory in satisfactory condition.  RESULTS Left main coronary artery:  The left main coronary artery was free of significant disease.  Left anterior descending artery:  The left anterior descending artery gave rise to two septal perforators and a diagonal branch.  There was somewhat slower flow down the LAD than the circumflex, but there was no significant obstruction.  Circumflex artery:  The circumflex artery gave rise to an intermediate branch, an atrial branch, a marginal branch and a posterolateral branch.  These vessels were free of significant disease.  Right coronary artery:  The right coronary artery was a moderately large vessel that gave rise to a conus branch, right ventricular branch, a posterior descending and a posterolateral branch.  These vessels were free of significant  disease.  Left ventriculogram:  The left ventriculogram performed in the RAO projection showed good wall motion with no evidence of hypokinesis.  The estimated ejection fraction was 50%.  The aortic pressure was 103/74 with a mean of 84. The left ventricular pressure was 103/21.  CONCLUSION:  Normal coronary angiography with normal left ventricular wall motion.  RECOMMENDATIONS:  Patients chest pain is somewhat atypical for ischemia.  He does have somewhat slightly decreased rate of flow down the LAD but the clinical significance of this is uncertain; I suspect it is probably not related to his symptoms.  He does have symptoms of dyspnea on exertion and I discussed his situation with Dr. Graciela Husbands, who feels we should get a V/Q scan. His O2 saturation is 98%, so the probability of pulmonary embolism is not real high, but we will plan to do this today.  If this is negative, then we will plan discharge today with followup with Dr. Purnell Shoemaker; I spoke with Dr. Purnell Shoemaker today.DD:  12/26/99 TD:  12/27/99 Job: [id number]

## 2011-02-07 LAB — CBC
MCHC: 34.6
MCV: 89
Platelets: 217
RDW: 13.1

## 2011-02-07 LAB — PROTIME-INR: INR: 1

## 2011-02-07 LAB — CK TOTAL AND CKMB (NOT AT ARMC): Relative Index: 1.2

## 2011-02-07 LAB — DIFFERENTIAL
Eosinophils Relative: 6 — ABNORMAL HIGH
Lymphocytes Relative: 22
Lymphs Abs: 1.9

## 2011-02-07 LAB — POCT I-STAT, CHEM 8
BUN: 10
Calcium, Ion: 1.29
Chloride: 102

## 2011-02-07 LAB — COMPREHENSIVE METABOLIC PANEL
AST: 45 — ABNORMAL HIGH
Albumin: 4.2
CO2: 26
Calcium: 10.2
Creatinine, Ser: 0.96
GFR calc Af Amer: >60
GFR calc non Af Amer: >60
Total Protein: 6.9

## 2011-02-07 LAB — HEMOGLOBIN A1C
Hgb A1c MFr Bld: 6
Mean Plasma Glucose: 136

## 2011-02-07 LAB — POCT CARDIAC MARKERS
CKMB, poc: 2.4
CKMB, poc: 3.3
Myoglobin, poc: 186
Myoglobin, poc: 188

## 2011-02-07 LAB — TROPONIN I: Troponin I: <0.01

## 2011-02-07 LAB — GLUCOSE, CAPILLARY: Glucose-Capillary: 113 — ABNORMAL HIGH

## 2011-02-07 LAB — D-DIMER, QUANTITATIVE: D-Dimer, Quant: <0.22

## 2011-04-05 ENCOUNTER — Encounter: Payer: Self-pay | Admitting: *Deleted

## 2011-04-05 ENCOUNTER — Inpatient Hospital Stay (HOSPITAL_COMMUNITY)
Admission: EM | Admit: 2011-04-05 | Discharge: 2011-04-08 | DRG: 194 | Disposition: A | Discharge status: Home / Self Care | Payer: Managed Care, Other (non HMO) | Attending: Internal Medicine | Admitting: Internal Medicine

## 2011-04-05 ENCOUNTER — Other Ambulatory Visit: Payer: Self-pay

## 2011-04-05 ENCOUNTER — Emergency Department (HOSPITAL_COMMUNITY): Payer: Managed Care, Other (non HMO)

## 2011-04-05 DIAGNOSIS — E119 Type 2 diabetes mellitus without complications: Secondary | ICD-10-CM | POA: Diagnosis present

## 2011-04-05 DIAGNOSIS — J189 Pneumonia, unspecified organism: Principal | ICD-10-CM | POA: Diagnosis present

## 2011-04-05 DIAGNOSIS — R0609 Other forms of dyspnea: Secondary | ICD-10-CM | POA: Diagnosis present

## 2011-04-05 DIAGNOSIS — E876 Hypokalemia: Secondary | ICD-10-CM | POA: Diagnosis present

## 2011-04-05 DIAGNOSIS — M6282 Rhabdomyolysis: Secondary | ICD-10-CM | POA: Diagnosis present

## 2011-04-05 DIAGNOSIS — E669 Obesity, unspecified: Secondary | ICD-10-CM | POA: Diagnosis present

## 2011-04-05 DIAGNOSIS — J159 Unspecified bacterial pneumonia: Secondary | ICD-10-CM

## 2011-04-05 DIAGNOSIS — D72829 Elevated white blood cell count, unspecified: Secondary | ICD-10-CM | POA: Diagnosis present

## 2011-04-05 DIAGNOSIS — E785 Hyperlipidemia, unspecified: Secondary | ICD-10-CM | POA: Diagnosis present

## 2011-04-05 DIAGNOSIS — K219 Gastro-esophageal reflux disease without esophagitis: Secondary | ICD-10-CM | POA: Diagnosis present

## 2011-04-05 DIAGNOSIS — M069 Rheumatoid arthritis, unspecified: Secondary | ICD-10-CM | POA: Diagnosis present

## 2011-04-05 DIAGNOSIS — R000 Tachycardia, unspecified: Secondary | ICD-10-CM | POA: Diagnosis present

## 2011-04-05 DIAGNOSIS — K5900 Constipation, unspecified: Secondary | ICD-10-CM | POA: Diagnosis present

## 2011-04-05 DIAGNOSIS — I2510 Atherosclerotic heart disease of native coronary artery without angina pectoris: Secondary | ICD-10-CM | POA: Diagnosis present

## 2011-04-05 DIAGNOSIS — R079 Chest pain, unspecified: Secondary | ICD-10-CM | POA: Diagnosis present

## 2011-04-05 DIAGNOSIS — I10 Essential (primary) hypertension: Secondary | ICD-10-CM | POA: Diagnosis present

## 2011-04-05 DIAGNOSIS — E871 Hypo-osmolality and hyponatremia: Secondary | ICD-10-CM | POA: Diagnosis present

## 2011-04-05 DIAGNOSIS — R509 Fever, unspecified: Secondary | ICD-10-CM | POA: Diagnosis present

## 2011-04-05 DIAGNOSIS — R0600 Dyspnea, unspecified: Secondary | ICD-10-CM | POA: Diagnosis present

## 2011-04-05 DIAGNOSIS — E039 Hypothyroidism, unspecified: Secondary | ICD-10-CM | POA: Diagnosis present

## 2011-04-05 HISTORY — DX: Unspecified osteoarthritis, unspecified site: M19.90

## 2011-04-05 HISTORY — DX: Hypothyroidism, unspecified: E03.9

## 2011-04-05 HISTORY — DX: Essential (primary) hypertension: I10

## 2011-04-05 HISTORY — DX: Atherosclerotic heart disease of native coronary artery without angina pectoris: I25.10

## 2011-04-05 HISTORY — DX: Gastro-esophageal reflux disease without esophagitis: K21.9

## 2011-04-05 LAB — URINALYSIS, ROUTINE W REFLEX MICROSCOPIC
Ketones, ur: NEGATIVE mg/dL
Leukocytes, UA: NEGATIVE
Nitrite: NEGATIVE
Specific Gravity, Urine: 1.01 (ref 1.005–1.030)
Urobilinogen, UA: 1 mg/dL (ref 0.0–1.0)
pH: 7 (ref 5.0–8.0)

## 2011-04-05 LAB — BASIC METABOLIC PANEL
Chloride: 92 mEq/L — ABNORMAL LOW (ref 96–112)
Creatinine, Ser: 0.75 mg/dL (ref 0.50–1.35)
GFR calc Af Amer: >90 mL/min (ref >90)
GFR calc non Af Amer: >90 mL/min (ref >90)
Potassium: 3.2 mEq/L — ABNORMAL LOW (ref 3.5–5.1)

## 2011-04-05 LAB — CBC
Hemoglobin: 15.5 g/dL (ref 13.0–17.0)
MCHC: 36.3 g/dL — ABNORMAL HIGH (ref 30.0–36.0)
MCHC: 36.2 g/dL — ABNORMAL HIGH (ref 30.0–36.0)
RBC: 5.07 MIL/uL (ref 4.22–5.81)
RDW: 14.1 % (ref 11.5–15.5)
WBC: 13.3 10*3/uL — ABNORMAL HIGH (ref 4.0–10.5)

## 2011-04-05 LAB — GLUCOSE, CAPILLARY
Glucose-Capillary: 205 mg/dL — ABNORMAL HIGH (ref 70–99)
Glucose-Capillary: 180 mg/dL — ABNORMAL HIGH (ref 70–99)

## 2011-04-05 LAB — MAGNESIUM: Magnesium: 1.9 mg/dL (ref 1.5–2.5)

## 2011-04-05 LAB — PHOSPHORUS: Phosphorus: 2.6 mg/dL (ref 2.3–4.6)

## 2011-04-05 LAB — DIFFERENTIAL
Basophils Absolute: 0.1 10*3/uL (ref 0.0–0.1)
Basophils Relative: 1 % (ref 0–1)
Neutro Abs: 10.8 10*3/uL — ABNORMAL HIGH (ref 1.7–7.7)
Neutrophils Relative %: 77 % (ref 43–77)

## 2011-04-05 LAB — CARDIAC PANEL(CRET KIN+CKTOT+MB+TROPI): CK, MB: 2.8 ng/mL (ref 0.3–4.0)

## 2011-04-05 MED ORDER — PREGABALIN 50 MG PO CAPS
50.0000 mg | ORAL_CAPSULE | Freq: Four times a day (QID) | ORAL | Status: DC
  Administered 2011-04-05 – 2011-04-08 (×11): 50 mg via ORAL
  Filled 2011-04-05 (×11): qty 1

## 2011-04-05 MED ORDER — ACETAMINOPHEN 325 MG PO TABS
650.0000 mg | ORAL_TABLET | Freq: Once | ORAL | Status: AC
  Administered 2011-04-05: 650 mg via ORAL
  Filled 2011-04-05: qty 2

## 2011-04-05 MED ORDER — ASPIRIN EC 81 MG PO TBEC
81.0000 mg | DELAYED_RELEASE_TABLET | Freq: Every day | ORAL | Status: DC
  Administered 2011-04-05 – 2011-04-08 (×4): 81 mg via ORAL
  Filled 2011-04-05 (×4): qty 1

## 2011-04-05 MED ORDER — HYDROCODONE-ACETAMINOPHEN 5-325 MG PO TABS
1.0000 | ORAL_TABLET | ORAL | Status: DC | PRN
  Administered 2011-04-08 (×2): 2 via ORAL
  Filled 2011-04-05 (×2): qty 2

## 2011-04-05 MED ORDER — MORPHINE SULFATE 4 MG/ML IJ SOLN
4.0000 mg | Freq: Once | INTRAMUSCULAR | Status: AC
  Administered 2011-04-05: 4 mg via INTRAVENOUS
  Filled 2011-04-05: qty 1

## 2011-04-05 MED ORDER — INSULIN ASPART 100 UNIT/ML ~~LOC~~ SOLN
0.0000 [IU] | Freq: Three times a day (TID) | SUBCUTANEOUS | Status: DC
  Administered 2011-04-06 – 2011-04-07 (×4): 1 [IU] via SUBCUTANEOUS
  Administered 2011-04-07 (×2): 2 [IU] via SUBCUTANEOUS
  Administered 2011-04-08 (×2): 1 [IU] via SUBCUTANEOUS
  Filled 2011-04-05: qty 3

## 2011-04-05 MED ORDER — DEXTROSE 5 % IV SOLN
1.0000 g | Freq: Once | INTRAVENOUS | Status: AC
  Administered 2011-04-05: 15:00:00 via INTRAVENOUS
  Filled 2011-04-05: qty 10

## 2011-04-05 MED ORDER — MAGNESIUM OXIDE 400 MG PO TABS
400.0000 mg | ORAL_TABLET | Freq: Every day | ORAL | Status: DC
  Administered 2011-04-05 – 2011-04-08 (×4): 400 mg via ORAL
  Filled 2011-04-05 (×4): qty 1

## 2011-04-05 MED ORDER — ONDANSETRON HCL 4 MG PO TABS: 4.0000 mg | ORAL_TABLET | Freq: Four times a day (QID) | ORAL | Status: DC | PRN

## 2011-04-05 MED ORDER — INSULIN GLARGINE 100 UNIT/ML ~~LOC~~ SOLN
5.0000 [IU] | Freq: Every day | SUBCUTANEOUS | Status: DC
  Administered 2011-04-05 – 2011-04-07 (×3): 5 [IU] via SUBCUTANEOUS
  Filled 2011-04-05: qty 3

## 2011-04-05 MED ORDER — DEXTROSE 5 % IV SOLN
500.0000 mg | Freq: Once | INTRAVENOUS | Status: AC
  Administered 2011-04-05: 500 mg via INTRAVENOUS
  Filled 2011-04-05: qty 500

## 2011-04-05 MED ORDER — ONDANSETRON HCL 4 MG/2ML IJ SOLN
4.0000 mg | Freq: Four times a day (QID) | INTRAMUSCULAR | Status: DC | PRN
  Administered 2011-04-05 – 2011-04-06 (×2): 4 mg via INTRAVENOUS
  Filled 2011-04-05 (×2): qty 2

## 2011-04-05 MED ORDER — THERA M PLUS PO TABS
1.0000 | ORAL_TABLET | Freq: Every day | ORAL | Status: DC
  Administered 2011-04-06 – 2011-04-08 (×3): 1 via ORAL
  Filled 2011-04-05 (×4): qty 1

## 2011-04-05 MED ORDER — ALBUTEROL SULFATE HFA 108 (90 BASE) MCG/ACT IN AERS
2.0000 | INHALATION_SPRAY | Freq: Four times a day (QID) | RESPIRATORY_TRACT | Status: DC | PRN
  Administered 2011-04-08: 2 via RESPIRATORY_TRACT
  Filled 2011-04-05: qty 6.7

## 2011-04-05 MED ORDER — FOLIC ACID 1 MG PO TABS
1.0000 mg | ORAL_TABLET | Freq: Every day | ORAL | Status: DC
  Administered 2011-04-06 – 2011-04-08 (×3): 1 mg via ORAL
  Filled 2011-04-05 (×4): qty 1

## 2011-04-05 MED ORDER — CARVEDILOL 6.25 MG PO TABS
6.2500 mg | ORAL_TABLET | Freq: Two times a day (BID) | ORAL | Status: DC
  Administered 2011-04-05 – 2011-04-08 (×6): 6.25 mg via ORAL
  Filled 2011-04-05 (×8): qty 1

## 2011-04-05 MED ORDER — ACETAMINOPHEN 325 MG PO TABS
650.0000 mg | ORAL_TABLET | Freq: Four times a day (QID) | ORAL | Status: DC | PRN
  Administered 2011-04-06 (×2): 650 mg via ORAL
  Filled 2011-04-05 (×2): qty 2

## 2011-04-05 MED ORDER — PANTOPRAZOLE SODIUM 40 MG PO TBEC
40.0000 mg | DELAYED_RELEASE_TABLET | Freq: Every day | ORAL | Status: DC
  Administered 2011-04-05 – 2011-04-08 (×4): 40 mg via ORAL
  Filled 2011-04-05 (×5): qty 1

## 2011-04-05 MED ORDER — GLIMEPIRIDE 4 MG PO TABS
4.0000 mg | ORAL_TABLET | Freq: Every day | ORAL | Status: DC
  Administered 2011-04-06 – 2011-04-08 (×3): 4 mg via ORAL
  Filled 2011-04-05 (×4): qty 1

## 2011-04-05 MED ORDER — POTASSIUM CHLORIDE CRYS ER 20 MEQ PO TBCR
40.0000 meq | EXTENDED_RELEASE_TABLET | ORAL | Status: AC
  Administered 2011-04-05 – 2011-04-06 (×2): 40 meq via ORAL
  Filled 2011-04-05 (×2): qty 2

## 2011-04-05 MED ORDER — SODIUM CHLORIDE 0.9 % IV SOLN
INTRAVENOUS | Status: DC
  Administered 2011-04-06 – 2011-04-07 (×3): via INTRAVENOUS

## 2011-04-05 MED ORDER — OMEGA-3-ACID ETHYL ESTERS 1 G PO CAPS
1.0000 g | ORAL_CAPSULE | Freq: Two times a day (BID) | ORAL | Status: DC
  Administered 2011-04-05 – 2011-04-08 (×6): 1 g via ORAL
  Filled 2011-04-05 (×7): qty 1

## 2011-04-05 MED ORDER — MORPHINE SULFATE 2 MG/ML IJ SOLN: 1.0000 mg | INTRAMUSCULAR | Status: DC | PRN

## 2011-04-05 MED ORDER — HYDRALAZINE HCL 20 MG/ML IJ SOLN
10.0000 mg | Freq: Three times a day (TID) | INTRAMUSCULAR | Status: DC | PRN
  Filled 2011-04-05: qty 0.5

## 2011-04-05 MED ORDER — LEVOTHYROXINE SODIUM 100 MCG PO TABS
100.0000 ug | ORAL_TABLET | Freq: Every day | ORAL | Status: DC
  Administered 2011-04-06 – 2011-04-08 (×3): 100 ug via ORAL
  Filled 2011-04-05 (×4): qty 1

## 2011-04-05 MED ORDER — ACETAMINOPHEN 650 MG RE SUPP: 650.0000 mg | Freq: Four times a day (QID) | RECTAL | Status: DC | PRN

## 2011-04-05 MED ORDER — ENOXAPARIN SODIUM 40 MG/0.4ML ~~LOC~~ SOLN
40.0000 mg | SUBCUTANEOUS | Status: DC
  Administered 2011-04-05 – 2011-04-07 (×2): 40 mg via SUBCUTANEOUS
  Filled 2011-04-05 (×4): qty 0.4

## 2011-04-05 MED ORDER — INSULIN ASPART 100 UNIT/ML ~~LOC~~ SOLN: 0.0000 [IU] | Freq: Every day | SUBCUTANEOUS | Status: DC

## 2011-04-05 NOTE — ED Provider Notes (Signed)
History     CSN: [id number] Arrival date & time: 04/05/2011  1:16 PM   First MD Initiated Contact with Patient 04/05/11 1335      Chief Complaint  Patient presents with  . Chest Pain  . Shortness of Breath  . URI  . Nausea    (Consider location/radiation/quality/duration/timing/severity/associated sxs/prior treatment) Patient is a 65 y.o. male presenting with chest pain, shortness of breath, and URI. The history is provided by the patient.  Chest Pain Primary symptoms include shortness of breath.    Shortness of Breath  Associated symptoms include chest pain and shortness of breath.  URI  He has been on Demerol for rheumatoid arthritis, but it was stopped because of a nonproductive cough. Cough has been present for about a week. Yesterday, he started having dyspnea and chills and pleuritic anterior chest pain. Symptoms have been getting worse. He denies fever, nausea, vomiting, and diaphoresis. Dyspnea is present at rest. Nothing makes his symptoms better, nothing makes it worse. Symptoms are described as severe.  Past Medical History  Diagnosis Date  . Hypertension   . Diabetes mellitus   . Hypothyroid     Past Surgical History  Procedure Date  . Rotator cuff repair   . Appendectomy   . Cholecystectomy     No family history on file.  History  Substance Use Topics  . Smoking status: Never Smoker   . Smokeless tobacco: Not on file  . Alcohol Use: No      Review of Systems  Respiratory: Positive for shortness of breath.   Cardiovascular: Positive for chest pain.  All other systems reviewed and are negative.    Allergies  Enbrel  Home Medications  No current outpatient prescriptions on file.  BP 144/81  Pulse 121  Temp(Src) 103.1 F (39.5 C) (Oral)  Resp 32  Ht 5\' 10"  (1.778 m)  Wt 275 lb (124.739 kg)  BMI 39.46 kg/m2  SpO2 93%  Physical Exam  Nursing note and vitals reviewed.  65 year old male who appears dyspneic and uncomfortable.  Temperature is 103.1, pulse is 121, respirations 32, oxygen saturation is 94% on room air. Head is normocephalic and atraumatic. PERRLA, EOMI. Oropharynx is clear. Neck is supple without adenopathy or JVD. Back is nontender. There's no CVA tenderness. Lungs have faint rales present at the left pain is, no rhonchi or wheezes. There is symmetric air flow. Heart has regular rate and rhythm which is tachycardic. No murmurs heard. There is no chest wall tenderness. Abdomen is soft and nontender without masses or hepatosplenomegaly. Extremities have trace edema, no cyanosis. Neurologic: Mental status is normal, cranial nerves are intact, there are no focal motor or sensory deficits.  ED Course  Procedures (including critical care time)   Labs Reviewed  CBC  DIFFERENTIAL  BASIC METABOLIC PANEL  URINALYSIS, ROUTINE W REFLEX MICROSCOPIC  CULTURE, BLOOD (ROUTINE X 2)  CULTURE, BLOOD (ROUTINE X 2)   No results found.  Results for orders placed during the hospital encounter of 04/05/11  CBC      Component Value Range   WBC 14.1 (*) 4.0 - 10.5 (K/uL)   RBC 5.19  4.22 - 5.81 (MIL/uL)   Hemoglobin 15.9  13.0 - 17.0 (g/dL)   HCT 91.4  78.2 - 95.6 (%)   MCV 84.4  78.0 - 100.0 (fL)   MCH 30.6  26.0 - 34.0 (pg)   MCHC 36.3 (*) 30.0 - 36.0 (g/dL)   RDW 21.3  08.6 - 57.8 (%)   Platelets 286  150 - 400 (K/uL)  DIFFERENTIAL      Component Value Range   Neutrophils Relative 77  43 - 77 (%)   Neutro Abs 10.8 (*) 1.7 - 7.7 (K/uL)   Lymphocytes Relative 10 (*) 12 - 46 (%)   Lymphs Abs 1.5  0.7 - 4.0 (K/uL)   Monocytes Relative 11  3 - 12 (%)   Monocytes Absolute 1.5 (*) 0.1 - 1.0 (K/uL)   Eosinophils Relative 2  0 - 5 (%)   Eosinophils Absolute 0.3  0.0 - 0.7 (K/uL)   Basophils Relative 1  0 - 1 (%)   Basophils Absolute 0.1  0.0 - 0.1 (K/uL)  BASIC METABOLIC PANEL      Component Value Range   Sodium 129 (*) 135 - 145 (mEq/L)   Potassium 3.2 (*) 3.5 - 5.1 (mEq/L)   Chloride 92 (*) 96 - 112 (mEq/L)    CO2 25  19 - 32 (mEq/L)   Glucose, Bld 143 (*) 70 - 99 (mg/dL)   BUN 9  6 - 23 (mg/dL)   Creatinine, Ser 1.61  0.50 - 1.35 (mg/dL)   Calcium 9.8  8.4 - 09.6 (mg/dL)   GFR calc non Af Amer >90  >90 (mL/min)   GFR calc Af Amer >90  >90 (mL/min)  URINALYSIS, ROUTINE W REFLEX MICROSCOPIC      Component Value Range   Color, Urine YELLOW  YELLOW    Appearance CLEAR  CLEAR    Specific Gravity, Urine 1.010  1.005 - 1.030    pH 7.0  5.0 - 8.0    Glucose, UA NEGATIVE  NEGATIVE (mg/dL)   Hgb urine dipstick NEGATIVE  NEGATIVE    Bilirubin Urine NEGATIVE  NEGATIVE    Ketones, ur NEGATIVE  NEGATIVE (mg/dL)   Protein, ur 30 (*) NEGATIVE (mg/dL)   Urobilinogen, UA 1.0  0.0 - 1.0 (mg/dL)   Nitrite NEGATIVE  NEGATIVE    Leukocytes, UA NEGATIVE  NEGATIVE   URINE MICROSCOPIC-ADD ON      Component Value Range   Urine-Other       Value: NO FORMED ELEMENTS SEEN ON URINE MICROSCOPIC EXAMINATION   Dg Chest Portable 1 View  04/05/2011  *RADIOLOGY REPORT*  Clinical Data: Chest pain.  Cough and fever.  Short of breath and weakness.  PORTABLE CHEST - 1 VIEW  Comparison: 05/24/2010.  Findings: Cardiopericardial silhouette remains enlarged, similar to prior.  The exam is under penetrated.  There may be airspace disease and / or atelectasis in the retrocardiac region.  No effusion is identified.  Monitoring leads are projected over the chest.  Mediastinal contours are unchanged allowing for rotation and apical lordotic projection.  IMPRESSION:  1.  Under penetrated exam.  Possible retrocardiac/left lower lobe airspace disease.  Consider follow-up PA and lateral radiograph. 2.  Unchanged cardiomegaly.  Original Report Authenticated By: Andreas Newport, M.D.   He has been given, for fever and morphine for pain. X-ray and physical exam are consistent with left lower lobe pneumonia. He is started on antibiotics for community acquired pneumonia-Rocephin and Zithromax. At this point he is resting more comfortably, although  he is still tachycardic. Consult has been obtained with [facility] hospitalists who agreed to admit the patient.  No diagnosis found.   Date: 04/05/2011  Rate: 121  Rhythm: sinus tachycardia  QRS Axis: right  Intervals: normal  ST/T Wave abnormalities: normal  Conduction Disutrbances:none  Narrative Interpretation: Right axis deviation, poor R wave progression-possible left posterior fascicular block. ECG is unchanged from  ECG of 07/11/2010  Old EKG Reviewed: unchanged  CRITICAL CARE Performed by: ZOXWR,UEAVW   Total critical care time: 55 minutes  Critical care time was exclusive of separately billable procedures and treating other patients.  Critical care was necessary to treat or prevent imminent or life-threatening deterioration.  Critical care was time spent personally by me on the following activities: development of treatment plan with patient and/or surrogate as well as nursing, discussions with consultants, evaluation of patient's response to treatment, examination of patient, obtaining history from patient or surrogate, ordering and performing treatments and interventions, ordering and review of laboratory studies, ordering and review of radiographic studies, pulse oximetry and re-evaluation of patient's condition.   MDM  Fever, chills, chest pain-most likely community-acquired pneumonia. He is somewhat immunocompromised do to her recent use of Enbrel.        Dione Booze, MD 04/05/11 [phone]

## 2011-04-05 NOTE — H&P (Signed)
PCP:   Dr. Lonie Peak   Chief Complaint:  Chest pain, shortness of breath, cough, general malaise.  HPI: 65 year old, caucasian and obese male with past medical history significant for hypertension, dyslipidemia, diabetes, rheumatoid arthritis, hypothyroidism, GERD, and non-obstructive coronary artery disease (status post catheterization in 2009 demonstrating 40% occlusion of LAD); who came into the hospital complaining of shortness of breath, pleuritic chest pain and cough for the last 2-3 days. Patient reports that about week ago he started noticing some persistent non-productive cough,  Which at that moment was attributed to the use of Enbrel; reumatologist discontinue Enbrel thinking that his cough was a side effect of the medication; but despite discontinuation of the medication patient continued to experienced cough and 2-3 days ago started noticing associated chills, pleuritic chest discomfort and shortness of breath. At that moment patient saw primary care physician who prescribed Nasonex and also albuterol without significant improvement of his symptoms. Patient came to emergency department for further evaluation. In ED and x-ray demonstrated acute infiltrates of his left long [place] cardiac aspect, he was febrile with a temperature up to 103.1, elevated [place] at 14,000 and he was tachypneic and tachycardic. [facility] hospitalist has been called to admit the patient for further evaluation and treatment.  Allergies:   Allergies  Allergen Reactions  . Enbrel Cough      Past Medical History  Diagnosis Date  . Hypertension   . Diabetes mellitus   . Hypothyroid   . Coronary artery disease   . GERD (gastroesophageal reflux disease)   . Arthritis     Past Surgical History  Procedure Date  . Rotator cuff repair   . Appendectomy   . Cholecystectomy     Prior to Admission medications   Not on File    Social History:  reports that he has never smoked. He does not have any  smokeless tobacco history on file. He reports that he does not drink alcohol or use illicit drugs.  History reviewed. No pertinent family history.  Review of Systems:  Constitutional: Denies fever, chills, diaphoresis, appetite change and fatigue.  HEENT: Denies photophobia, eye pain, redness, hearing loss, ear pain, congestion, sore throat, rhinorrhea, sneezing, mouth sores, trouble swallowing, neck pain, neck stiffness and tinnitus.   Respiratory: Denies SOB, DOE, cough, chest tightness,  and wheezing.   Cardiovascular: Denies chest pain, palpitations and leg swelling.  Gastrointestinal: Denies nausea, vomiting, abdominal pain, diarrhea, constipation, blood in stool and abdominal distention.  Genitourinary: Denies dysuria, urgency, frequency, hematuria, flank pain and difficulty urinating.  Musculoskeletal: Denies myalgias, back pain, joint swelling, arthralgias and gait problem.  Skin: Denies pallor, rash and wound.  Neurological: Denies dizziness, seizures, syncope, weakness, light-headedness, numbness and headaches.  Hematological: Denies adenopathy. Easy bruising, personal or family bleeding history  Psychiatric/Behavioral: Denies suicidal ideation, mood changes, confusion, nervousness, sleep disturbance and agitation   Physical Exam: Blood pressure 134/86, pulse 117, temperature 100.3 F (37.9 C), temperature source Oral, resp. rate 24, height 5\' 10"  (1.778 m), weight 124.739 kg (275 lb), SpO2 93.00%. GEN: Dyspneic, uncomfortable, warm to touch; cooperative to examination. HEENT: Normocephalic, atraumatic; eyes PERRLA, EOMI, no ictericia, no nystagmus; mild dry mucous membranes, no exudates or erythema inside his mouth. Neck: Supple, no bruits, no thyromegaly. Resp: No wheezing, no crackles, scattered rhonchi. Heart: Tachycardic, no murmurs, no gallops or rubs. Abdomen: Obese, soft, nontender, positive bowel sounds. Extremities: Trace of edema bilaterally up to his calves, no  cyanosis and good pedal pulses. Skin: No rash, no petechiae, no  open wounds. Neuro: No focal deficit, cranial nerve 2-12 grossly intact, normal finger to nose, muscle strength 4 out of 5 bilaterally/symmetrically.  Labs on Admission:  Results for orders placed during the hospital encounter of 04/05/11 (from the past 48 hour(s))  URINALYSIS, ROUTINE W REFLEX MICROSCOPIC     Status: Abnormal   Collection Time   04/05/11  2:00 PM      Component Value Range Comment   Color, Urine YELLOW  YELLOW     Appearance CLEAR  CLEAR     Specific Gravity, Urine 1.010  1.005 - 1.030     pH 7.0  5.0 - 8.0     Glucose, UA NEGATIVE  NEGATIVE (mg/dL)    Hgb urine dipstick NEGATIVE  NEGATIVE     Bilirubin Urine NEGATIVE  NEGATIVE     Ketones, ur NEGATIVE  NEGATIVE (mg/dL)    Protein, ur 30 (*) NEGATIVE (mg/dL)    Urobilinogen, UA 1.0  0.0 - 1.0 (mg/dL)    Nitrite NEGATIVE  NEGATIVE     Leukocytes, UA NEGATIVE  NEGATIVE    URINE MICROSCOPIC-ADD ON     Status: Normal   Collection Time   04/05/11  2:00 PM      Component Value Range Comment   Urine-Other        Value: NO FORMED ELEMENTS SEEN ON URINE MICROSCOPIC EXAMINATION  CBC     Status: Abnormal   Collection Time   04/05/11  2:20 PM      Component Value Range Comment   WBC 14.1 (*) 4.0 - 10.5 (K/uL)    RBC 5.19  4.22 - 5.81 (MIL/uL)    Hemoglobin 15.9  13.0 - 17.0 (g/dL)    HCT 29.5  62.1 - 30.8 (%)    MCV 84.4  78.0 - 100.0 (fL)    MCH 30.6  26.0 - 34.0 (pg)    MCHC 36.3 (*) 30.0 - 36.0 (g/dL)    RDW 65.7  84.6 - 96.2 (%)    Platelets 286  150 - 400 (K/uL)   DIFFERENTIAL     Status: Abnormal   Collection Time   04/05/11  2:20 PM      Component Value Range Comment   Neutrophils Relative 77  43 - 77 (%)    Neutro Abs 10.8 (*) 1.7 - 7.7 (K/uL)    Lymphocytes Relative 10 (*) 12 - 46 (%)    Lymphs Abs 1.5  0.7 - 4.0 (K/uL)    Monocytes Relative 11  3 - 12 (%)    Monocytes Absolute 1.5 (*) 0.1 - 1.0 (K/uL)    Eosinophils Relative 2  0 - 5 (%)     Eosinophils Absolute 0.3  0.0 - 0.7 (K/uL)    Basophils Relative 1  0 - 1 (%)    Basophils Absolute 0.1  0.0 - 0.1 (K/uL)   BASIC METABOLIC PANEL     Status: Abnormal   Collection Time   04/05/11  2:20 PM      Component Value Range Comment   Sodium 129 (*) 135 - 145 (mEq/L)    Potassium 3.2 (*) 3.5 - 5.1 (mEq/L)    Chloride 92 (*) 96 - 112 (mEq/L)    CO2 25  19 - 32 (mEq/L)    Glucose, Bld 143 (*) 70 - 99 (mg/dL)    BUN 9  6 - 23 (mg/dL)    Creatinine, Ser 9.52  0.50 - 1.35 (mg/dL)    Calcium 9.8  8.4 - 10.5 (mg/dL)  GFR calc non Af Amer >90  >90 (mL/min)    GFR calc Af Amer >90  >90 (mL/min)     Radiological Exams on Admission: Dg Chest Portable 1 View  04/05/2011  *RADIOLOGY REPORT*  Clinical Data: Chest pain.  Cough and fever.  Short of breath and weakness.  PORTABLE CHEST - 1 VIEW  Comparison: 05/24/2010.  Findings: Cardiopericardial silhouette remains enlarged, similar to prior.  The exam is under penetrated.  There may be airspace disease and / or atelectasis in the retrocardiac region.  No effusion is identified.  Monitoring leads are projected over the chest.  Mediastinal contours are unchanged allowing for rotation and apical lordotic projection.  IMPRESSION:  1.  Under penetrated exam.  Possible retrocardiac/left lower lobe airspace disease.  Consider follow-up PA and lateral radiograph. 2.  Unchanged cardiomegaly.  Original Report Authenticated By: Andreas Newport, M.D.    Assessment/Plan 1-SOB (shortness of breath): 2/2 PNA especially in a patient complaining of fever, and cough, and with elevated white blood cells. Patient will be admitted to telemetry bed, will provide fluid resuscitation. Control his temperature with antipyretics(acetaminophen) and will start antibiotics (Zithromax and Rocephin) for community-acquired pneumonia. Will repeat x-ray 2 views in the morning.  Due to significant dyspnea will check an ABG to get better assessment of patient  oxygenation.  2-DM (diabetes mellitus): Will check A1c, start the patient on sliding scale insulin. Will hold metformin. 3-HTN (hypertension): Apply to continue patient's home medications once his medications are reconcile, at this point we'll continue just Coreg twice a day 6.25 mg.  4-Tachycardia: Secondary to mild dehydration, fever and acute infection. We'll provide gentle fluid resuscitation and will control his temperature.  5-Chest pain: Pleuritic in nature most likely secondary to ongoing pneumonia. With the history of non-obstructive coronary artery disease and diabetes will cycle his cardiac enzymes and continue his B-blockers and aspirin on daily basis. Patient has been admitted to telemetry and there is no new changes suggesting ischemia on his achy EKG.  6-Fever: Secondary to infection, patient has been started on antibiotics and will continue his acetaminophen to control his fever. Blood cultures taken before starting antibiotics, will follow results.  7-Leukocytosis: Secondary to ongoing infection, patient has been started on antibiotics; will follow WBC's trend.  8-Hypothyroidism: We'll check TSH, meanwhile continue Synthroid same dose that he was using at home.  9-GERD (gastroesophageal reflux disease): Continue PPI.  10-RA (rheumatoid arthritis): No significant joint pain at this moment, will use pain medication as needed. Patient will continue following by his rheumatologist as an outpatient and continue methotrexate weekly as an outpatient.  11-CAD in native artery: Continue beta blockers, continue aspirin, patient has been admitted to telemetry and we are going to cycle his cardiac enzymes. No changes of ischemia are seen on his EKG.  12-Hypokalemia: Most likely due to poor intake and concomitant use of diuretics. will replete his potassium and check a magnesium level. Follow BMET in am.  13-DVT: Will use Lovenox.   Time Spent on Admission: 55  minutes.  Joshua Lara 04/05/2011, 5:13 PM

## 2011-04-05 NOTE — ED Notes (Signed)
O2 2L [place] applied

## 2011-04-05 NOTE — ED Notes (Signed)
Patient with hx of emberal rx,  He stopped med due to onset of cough.  He continues to have cough,  He has increased shortness of breath and chest pain.  Patient is short of breath at rest.

## 2011-04-05 NOTE — ED Notes (Signed)
Portable chest X-ray done.

## 2011-04-05 NOTE — ED Notes (Signed)
Meal tray ordered 

## 2011-04-05 NOTE — ED Notes (Addendum)
Received pt. From triage via w/c with c/o SOB, pt. Alert and oriented, NAD noted,pt. Transferred to stretcher without diff.,Dr./ Preston Fleeting to the bedside,

## 2011-04-06 ENCOUNTER — Other Ambulatory Visit: Payer: Self-pay

## 2011-04-06 ENCOUNTER — Inpatient Hospital Stay (HOSPITAL_COMMUNITY): Payer: Managed Care, Other (non HMO)

## 2011-04-06 LAB — BASIC METABOLIC PANEL
CO2: 26 mEq/L (ref 19–32)
Calcium: 9.5 mg/dL (ref 8.4–10.5)
Creatinine, Ser: 0.81 mg/dL (ref 0.50–1.35)

## 2011-04-06 LAB — CARDIAC PANEL(CRET KIN+CKTOT+MB+TROPI)
CK, MB: 3 ng/mL (ref 0.3–4.0)
Relative Index: 1 (ref 0.0–2.5)
Total CK: 252 U/L — ABNORMAL HIGH (ref 7–232)
Troponin I: <0.3 ng/mL (ref <0.30)

## 2011-04-06 LAB — HEMOGLOBIN A1C: Mean Plasma Glucose: 163 mg/dL — ABNORMAL HIGH (ref <117)

## 2011-04-06 LAB — CBC
MCH: 30.3 pg (ref 26.0–34.0)
MCV: 86.4 fL (ref 78.0–100.0)
Platelets: 272 10*3/uL (ref 150–400)
RDW: 14.3 % (ref 11.5–15.5)
WBC: 14.2 10*3/uL — ABNORMAL HIGH (ref 4.0–10.5)

## 2011-04-06 LAB — GLUCOSE, CAPILLARY
Glucose-Capillary: 157 mg/dL — ABNORMAL HIGH (ref 70–99)
Glucose-Capillary: 141 mg/dL — ABNORMAL HIGH (ref 70–99)
Glucose-Capillary: 135 mg/dL — ABNORMAL HIGH (ref 70–99)
Glucose-Capillary: 142 mg/dL — ABNORMAL HIGH (ref 70–99)

## 2011-04-06 LAB — EXPECTORATED SPUTUM ASSESSMENT W GRAM STAIN, RFLX TO RESP C

## 2011-04-06 LAB — LIPID PANEL
Cholesterol: 132 mg/dL (ref 0–200)
Total CHOL/HDL Ratio: 3.8 RATIO

## 2011-04-06 MED ORDER — GUAIFENESIN ER 600 MG PO TB12
600.0000 mg | ORAL_TABLET | Freq: Two times a day (BID) | ORAL | Status: DC
  Administered 2011-04-06 – 2011-04-08 (×5): 600 mg via ORAL
  Filled 2011-04-06 (×7): qty 1

## 2011-04-06 MED ORDER — POLYETHYLENE GLYCOL 3350 17 G PO PACK
17.0000 g | PACK | Freq: Every day | ORAL | Status: DC
  Administered 2011-04-06 – 2011-04-08 (×3): 17 g via ORAL
  Filled 2011-04-06 (×3): qty 1

## 2011-04-06 MED ORDER — LEVOFLOXACIN IN D5W 750 MG/150ML IV SOLN
750.0000 mg | INTRAVENOUS | Status: DC
  Administered 2011-04-06 – 2011-04-07 (×2): 750 mg via INTRAVENOUS
  Filled 2011-04-06 (×3): qty 150

## 2011-04-06 NOTE — Progress Notes (Signed)
Subjective: Patient is still complaining of significant cough (now productive), currently not febrile, denies chest pain and is having good oxygen saturation on room air. No nausea/vomiting, no abdominal pain. Patient reports some constipation.  Objective: Vital signs in last 24 hours: Temp:  [97.7 F (36.5 C)-103.1 F (39.5 C)] 97.7 F (36.5 C) (11/25 0510) Pulse Rate:  [108-128] 108  (11/25 0757) Resp:  [20-32] 20  (11/25 0510) BP: (105-154)/(68-131) 129/89 mmHg (11/25 0757) SpO2:  [92 %-96 %] 94 % (11/25 0510) Weight:  [119.8 kg (264 lb 1.8 oz)-124.739 kg (275 lb)] 264 lb 1.8 oz (119.8 kg) (11/24 1820) Weight change:  Last BM Date: 04/05/11  Intake/Output from previous day: 11/24 0701 - 11/25 0700 In: 1526.3 [P.O.:700; I.V.:826.3] Out: 950 [Urine:950] Total I/O In: 600 [P.O.:600] Out: -    Physical Exam: General: Alert, awake, oriented x3, in mild distress due to constant cough. HEENT: No bruits, no goiter. Heart: sligthly tachycardic, without murmurs, rubs, gallops. Lungs: Clear to auscultation bilaterally. Abdomen: Soft, nontender, nondistended, positive bowel sounds. Extremities: No clubbing cyanosis or edema with positive pedal pulses. Neuro: Grossly intact, nonfocal.    Lab Results: Basic Metabolic Panel:  Basename 04/06/11 0630 04/05/11 1826 04/05/11 1420  NA 137 -- 129*  K 3.9 -- 3.2*  CL 101 -- 92*  CO2 26 -- 25  GLUCOSE 146* -- 143*  BUN 10 -- 9  CREATININE 0.81 -- 0.75  CALCIUM 9.5 -- 9.8  MG -- 1.9 --  PHOS -- 2.6 --   CBC:  Basename 04/06/11 0630 04/05/11 1826 04/05/11 1420  WBC 14.2* 13.3* --  NEUTROABS -- -- 10.8*  HGB 15.4 15.5 --  HCT 43.9 42.8 --  MCV 86.4 84.4 --  PLT 272 268 --   Cardiac Enzymes:  Basename 04/06/11 0630 04/05/11 2343 04/05/11 1826  CKTOTAL 252* 303* 295*  CKMB 3.1 3.0 2.8  CKMBINDEX -- -- --  TROPONINI <0.30 <0.30 <0.30   BNP: No results found for this basename: POCBNP:3 in the last 72 hours D-Dimer: No  results found for this basename: DDIMER:2 in the last 72 hours CBG:  Basename 04/06/11 0808 04/06/11 0537 04/05/11 2120 04/05/11 1845  GLUCAP 141* 157* 180* 205*   Hemoglobin A1C: No results found for this basename: HGBA1C in the last 72 hours Fasting Lipid Panel:  Basename 04/06/11 0630  CHOL 132  HDL 35*  LDLCALC 68  TRIG 161  CHOLHDL 3.8  LDLDIRECT --    Studies/Results: Dg Chest 2 View  04/06/2011  *RADIOLOGY REPORT*  Clinical Data: Shortness of breath and cough; follow up infiltrates suggested on prior portable exam.  PA and lateral films recommended for further evaluation.  CHEST - 2 VIEW  Comparison: Chest radiograph performed 04/05/2011  Findings: The lungs are well-aerated.  Patchy airspace opacification is noted at the left lung base, and lesser extent at the right lung base.  Mild opacity is also seen at the left midlung zone.  Findings are compatible with multifocal pneumonia.  There is no evidence of pleural effusion or pneumothorax.  The heart is borderline enlarged; mild vascular congestion is noted.  No acute osseous abnormalities are seen.  IMPRESSION:  1.  Patchy bilateral airspace opacification, most prominent at the left lung base, concerning for multifocal pneumonia. 2.  Borderline cardiomegaly and mild vascular congestion noted.  Original Report Authenticated By: Tonia Ghent, M.D.   Dg Chest Portable 1 View  04/05/2011  *RADIOLOGY REPORT*  Clinical Data: Chest pain.  Cough and fever.  Short of breath  and weakness.  PORTABLE CHEST - 1 VIEW  Comparison: 05/24/2010.  Findings: Cardiopericardial silhouette remains enlarged, similar to prior.  The exam is under penetrated.  There may be airspace disease and / or atelectasis in the retrocardiac region.  No effusion is identified.  Monitoring leads are projected over the chest.  Mediastinal contours are unchanged allowing for rotation and apical lordotic projection.  IMPRESSION:  1.  Under penetrated exam.  Possible  retrocardiac/left lower lobe airspace disease.  Consider follow-up PA and lateral radiograph. 2.  Unchanged cardiomegaly.  Original Report Authenticated By: Andreas Newport, M.D.    Medications: Scheduled Meds:   . acetaminophen  650 mg Oral Once  . aspirin EC  81 mg Oral Daily  . azithromycin  500 mg Intravenous Once  . carvedilol  6.25 mg Oral BID WC  . cefTRIAXone (ROCEPHIN) IV  1 g Intravenous Once  . enoxaparin  40 mg Subcutaneous Q24H  . folic acid  1 mg Oral Daily  . glimepiride  4 mg Oral QAC breakfast  . guaiFENesin  600 mg Oral BID  . insulin aspart  0-5 Units Subcutaneous QHS  . insulin aspart  0-9 Units Subcutaneous TID WC  . insulin glargine  5 Units Subcutaneous QHS  . levothyroxine  100 mcg Oral QAC breakfast  . magnesium oxide  400 mg Oral Daily  .  morphine injection  4 mg Intravenous Once  . multivitamins ther. w/minerals  1 tablet Oral Daily  . omega-3 acid ethyl esters  1 g Oral BID  . pantoprazole  40 mg Oral Q1200  . potassium chloride  40 mEq Oral Q4H  . pregabalin  50 mg Oral QID   Continuous Infusions:   . sodium chloride 75 mL/hr at 04/06/11 0023   PRN Meds:.acetaminophen, acetaminophen, albuterol, hydrALAZINE, HYDROcodone-acetaminophen, morphine, ondansetron (ZOFRAN) IV, ondansetron  Assessment/Plan: 1-PNA (pneumonia): 2 views chest x-ray done this morning confirmed diagnosis of pneumonia. Patient is currently not febrile and responded to antibiotic therapy. We'll continue Zithromax and Rocephin for his community-acquired pneumonia. Blood cultures, sputum culture, a strep antigen immunity and are pending at this point.  2-SOB (shortness of breath): Secondary to problem #1. Slightly improved. Continue treatment as described above. 3-DM (diabetes mellitus): A1c pending, continue sliding scale insulin and Lantus.  4-HTN (hypertension): Stable. Continue current medications.  5-Tachycardia: Sinus tachycardia per EKG performed early this morning and also  by EKG done in the emergency department on admission. Most likely secondary to Fever and dehydration. Significantly improve after fluid resuscitation and temperature control with Tylenol. Will continue monitoring.  6-Chest pain: Resolved. Most likely associated with pneumonia and constant cough.  7-Fever: Due to infection, continue antibiotic therapy. Currently day 2/10.  8-Hypothyroidism: TSH results pending, continue Synthroid.  9-GERD (gastroesophageal reflux disease): Continue PPI.  10-RA (rheumatoid arthritis): Stable. Will hold methotrexate while in the hospital and will use pain medication as needed.  11-CAD in native artery: Cardiac enzymes negative x3, no acute ischemic changes on EKG. Telemetry Just showing mild sinus tachycardia. We'll continue treatment for his pneumonia at this point and will continue his beta blocker and aspirin.  12-Hypokalemia: Resolved.  13-Hyponatremia: Resolved. Most likely 2/2 dehydration.  14-constipation: Will start him on MiraLAX 17 g by mouth daily.  15-DVT prophylaxis: Continue Lovenox.  16-mild rhabdomyolysis: Significantly improve with fluid resuscitation. At this point patient has been advised to keep himself hydrated.  17-dyslipidemia: Continue lovaza.    LOS: 1 day   Joshua Lara 04/06/2011, 10:03 AM

## 2011-04-07 LAB — GLUCOSE, CAPILLARY
Glucose-Capillary: 151 mg/dL — ABNORMAL HIGH (ref 70–99)
Glucose-Capillary: 136 mg/dL — ABNORMAL HIGH (ref 70–99)
Glucose-Capillary: 161 mg/dL — ABNORMAL HIGH (ref 70–99)
Glucose-Capillary: 164 mg/dL — ABNORMAL HIGH (ref 70–99)

## 2011-04-07 LAB — BASIC METABOLIC PANEL
BUN: 11 mg/dL (ref 6–23)
CO2: 27 mEq/L (ref 19–32)
Chloride: 100 mEq/L (ref 96–112)
GFR calc Af Amer: >90 mL/min (ref >90)
Potassium: 4.3 mEq/L (ref 3.5–5.1)

## 2011-04-07 LAB — CBC
HCT: 43.2 % (ref 39.0–52.0)
MCV: 86.7 fL (ref 78.0–100.0)
Platelets: 284 10*3/uL (ref 150–400)
RBC: 4.98 MIL/uL (ref 4.22–5.81)
RDW: 14.4 % (ref 11.5–15.5)
WBC: 12.5 10*3/uL — ABNORMAL HIGH (ref 4.0–10.5)

## 2011-04-07 MED ORDER — BENZONATATE 100 MG PO CAPS
100.0000 mg | ORAL_CAPSULE | Freq: Three times a day (TID) | ORAL | Status: DC | PRN
  Administered 2011-04-07 – 2011-04-08 (×3): 100 mg via ORAL
  Filled 2011-04-07 (×3): qty 1

## 2011-04-07 NOTE — Progress Notes (Signed)
Subjective: Is still complaining of cough, and also some streaks of blood with sputum. No fever, no chest pain, no nausea/vomiting; no other acute complaints and no overnight events.  Objective: Vital signs in last 24 hours: Temp:  [97.6 F (36.4 C)-99.2 F (37.3 C)] 99.2 F (37.3 C) (11/26 0456) Pulse Rate:  [96-111] 96  (11/26 0814) Resp:  [20] 20  (11/26 0456) BP: (115-124)/(62-79) 120/76 mmHg (11/26 0814) SpO2:  [90 %-94 %] 90 % (11/26 0456) Weight change:  Last BM Date: 04/05/11  Intake/Output from previous day: 11/25 0701 - 11/26 0700 In: 2991.1 [P.O.:1940; I.V.:897.1; IV Piggyback:154] Out: 900 [Urine:900] Total I/O In: 240 [P.O.:240] Out: -    Physical Exam: General: Alert, awake, oriented x3, NAD HEENT: No bruits, no goiter. Heart: RRR, without murmurs, rubs, gallops. Lungs: Clear to auscultation bilaterally. Abdomen: Soft, nontender, nondistended, positive bowel sounds. Extremities: No clubbing cyanosis or edema with positive pedal pulses. Neuro: Grossly intact, nonfocal.  Lab Results: Basic Metabolic Panel:  Basename 04/07/11 0835 04/06/11 0630 04/05/11 1826  NA 136 137 --  K 4.3 3.9 --  CL 100 101 --  CO2 27 26 --  GLUCOSE 183* 146* --  BUN 11 10 --  CREATININE 0.77 0.81 --  CALCIUM 9.4 9.5 --  MG -- -- 1.9  PHOS -- -- 2.6   CBC:  Basename 04/07/11 0835 04/06/11 0630 04/05/11 1420  WBC 12.5* 14.2* --  NEUTROABS -- -- 10.8*  HGB 15.1 15.4 --  HCT 43.2 43.9 --  MCV 86.7 86.4 --  PLT 284 272 --   Cardiac Enzymes:  Basename 04/06/11 0630 04/05/11 2343 04/05/11 1826  CKTOTAL 252* 303* 295*  CKMB 3.1 3.0 2.8  CKMBINDEX -- -- --  TROPONINI <0.30 <0.30 <0.30   BNP: No results found for this basename: POCBNP:3 in the last 72 hours D-Dimer: No results found for this basename: DDIMER:2 in the last 72 hours CBG:  Basename 04/07/11 1129 04/07/11 0722 04/06/11 2124 04/06/11 1716 04/06/11 1212 04/06/11 0808  GLUCAP 136* 151* 172* 142* 135* 141*     Hemoglobin A1C:  Basename 04/05/11 1826  HGBA1C 7.3*   Fasting Lipid Panel:  Basename 04/06/11 0630  CHOL 132  HDL 35*  LDLCALC 68  TRIG 960  CHOLHDL 3.8  LDLDIRECT --    Studies/Results: Dg Chest 2 View  04/06/2011  *RADIOLOGY REPORT*  Clinical Data: Shortness of breath and cough; follow up infiltrates suggested on prior portable exam.  PA and lateral films recommended for further evaluation.  CHEST - 2 VIEW  Comparison: Chest radiograph performed 04/05/2011  Findings: The lungs are well-aerated.  Patchy airspace opacification is noted at the left lung base, and lesser extent at the right lung base.  Mild opacity is also seen at the left midlung zone.  Findings are compatible with multifocal pneumonia.  There is no evidence of pleural effusion or pneumothorax.  The heart is borderline enlarged; mild vascular congestion is noted.  No acute osseous abnormalities are seen.  IMPRESSION:  1.  Patchy bilateral airspace opacification, most prominent at the left lung base, concerning for multifocal pneumonia. 2.  Borderline cardiomegaly and mild vascular congestion noted.  Original Report Authenticated By: Tonia Ghent, M.D.   Dg Chest Portable 1 View  04/05/2011  *RADIOLOGY REPORT*  Clinical Data: Chest pain.  Cough and fever.  Short of breath and weakness.  PORTABLE CHEST - 1 VIEW  Comparison: 05/24/2010.  Findings: Cardiopericardial silhouette remains enlarged, similar to prior.  The exam is under penetrated.  There may be airspace disease and / or atelectasis in the retrocardiac region.  No effusion is identified.  Monitoring leads are projected over the chest.  Mediastinal contours are unchanged allowing for rotation and apical lordotic projection.  IMPRESSION:  1.  Under penetrated exam.  Possible retrocardiac/left lower lobe airspace disease.  Consider follow-up PA and lateral radiograph. 2.  Unchanged cardiomegaly.  Original Report Authenticated By: Andreas Newport, M.D.     Medications: Scheduled Meds:    . aspirin EC  81 mg Oral Daily  . carvedilol  6.25 mg Oral BID WC  . enoxaparin  40 mg Subcutaneous Q24H  . folic acid  1 mg Oral Daily  . glimepiride  4 mg Oral QAC breakfast  . guaiFENesin  600 mg Oral BID  . insulin aspart  0-5 Units Subcutaneous QHS  . insulin aspart  0-9 Units Subcutaneous TID WC  . insulin glargine  5 Units Subcutaneous QHS  . levofloxacin (LEVAQUIN) IV  750 mg Intravenous Q24H  . levothyroxine  100 mcg Oral QAC breakfast  . magnesium oxide  400 mg Oral Daily  . multivitamins ther. w/minerals  1 tablet Oral Daily  . omega-3 acid ethyl esters  1 g Oral BID  . pantoprazole  40 mg Oral Q1200  . polyethylene glycol  17 g Oral Daily  . pregabalin  50 mg Oral QID   Continuous Infusions:    . sodium chloride 20 mL/hr at 04/06/11 1335   PRN Meds:.acetaminophen, acetaminophen, albuterol, benzonatate, hydrALAZINE, HYDROcodone-acetaminophen, morphine, ondansetron (ZOFRAN) IV, ondansetron  Assessment/Plan: 1-PNA (pneumonia): Improving, patient without further fevers, wobbles cells almost back to normal. We'll continue IV antibiotics for an extra day, start patient on Tessalon pearls and will repeat chest grade tomorrow morning. If his symptoms continued to improve and went on to DC home to finish antibiotic therapy and by mouth.  2-SOB (shortness of breath): Secondary to problem #1. Improved. Continue treatment as described above. 3-DM (diabetes mellitus): A1c 7.3, continue sliding scale insulin and Lantus.  4-HTN (hypertension): Stable. Continue current medications.  5-Tachycardia: Resolved.  6-Fever: Currently day 3/10 of antibiotics and today 36 hours without fevers.   7-Hypothyroidism: TSH within normal limits, continue Synthroid at current dose, no further adjustments needed.  8-GERD (gastroesophageal reflux disease): Continue PPI.  9-RA (rheumatoid arthritis): Stable. Will hold methotrexate while in the hospital and  will use pain medication as needed.  10-CAD in native artery: Continue beta blockers, continue aspirin; she will also follow a heart healthy diet. No signs of acute coronary syndrome during this admission.  11-constipation: Continue when necessary Miralax.  12--dyslipidemia: Continue lovaza.  13-DVT prophylaxis: Continue Lovenox.    LOS: 2 days   Joshua Lara 04/07/2011, 12:39 PM

## 2011-04-07 NOTE — Progress Notes (Signed)
Utilization Review Completed.  Joshua Lara  04/07/2011 

## 2011-04-08 ENCOUNTER — Inpatient Hospital Stay (HOSPITAL_COMMUNITY): Payer: Managed Care, Other (non HMO)

## 2011-04-08 LAB — BASIC METABOLIC PANEL
Chloride: 102 mEq/L (ref 96–112)
GFR calc Af Amer: >90 mL/min (ref >90)
GFR calc non Af Amer: >90 mL/min (ref >90)
Potassium: 4 mEq/L (ref 3.5–5.1)
Sodium: 136 mEq/L (ref 135–145)

## 2011-04-08 LAB — CBC
MCHC: 34.6 g/dL (ref 30.0–36.0)
RDW: 14.3 % (ref 11.5–15.5)
WBC: 9.2 10*3/uL (ref 4.0–10.5)

## 2011-04-08 LAB — CULTURE, RESPIRATORY W GRAM STAIN

## 2011-04-08 MED ORDER — HYDROCODONE-ACETAMINOPHEN 10-325 MG PO TABS: 1.0000 | ORAL_TABLET | Freq: Four times a day (QID) | ORAL | Status: DC | PRN

## 2011-04-08 MED ORDER — LEVOFLOXACIN 750 MG PO TABS: 750.0000 mg | ORAL_TABLET | Freq: Every day | ORAL | Status: AC

## 2011-04-08 MED ORDER — ACETAMINOPHEN 325 MG PO TABS: 650.0000 mg | ORAL_TABLET | Freq: Four times a day (QID) | ORAL | Status: AC | PRN

## 2011-04-08 MED ORDER — BENZONATATE 100 MG PO CAPS: 100.0000 mg | ORAL_CAPSULE | Freq: Three times a day (TID) | ORAL | Status: AC | PRN

## 2011-04-08 NOTE — Discharge Summary (Signed)
Physician Discharge Summary  Patient ID: Joshua Lara MRN: [id number] DOB/AGE: 1945-06-27 65 y.o.  Admit date: 04/05/2011 Discharge date: 04/08/2011  Primary Care Physician:  Ailene Ravel, MD [facility].   Discharge Diagnoses:   1-shortness of breath secondary to community acquired pneumonia. 2-diabetes mellitus (A1c 7.3) 3-hypertension 4-tachycardia and fever secondary to problem #1. 5-Hypothyroidism. 6-GERD. 7-rheumatoid arthritis. 8-history of coronary artery disease. 9-chronic constipation. 10-dyslipidemia.   Present on Admission:  .SOB (shortness of breath) .PNA (pneumonia) .DM (diabetes mellitus) .HTN (hypertension) .Tachycardia .Chest pain .Fever .Leukocytosis .Hypothyroidism .GERD (gastroesophageal reflux disease) .RA (rheumatoid arthritis) .Obesity .CAD in native artery .Hypokalemia  Current Discharge Medication List    START taking these medications   Details  acetaminophen (TYLENOL) 325 MG tablet Take 2 tablets (650 mg total) by mouth every 6 (six) hours as needed (or Fever >/= 101). Qty: 30 tablet    benzonatate (TESSALON) 100 MG capsule Take 1 capsule (100 mg total) by mouth 3 (three) times daily as needed for cough. Qty: 30 capsule, Refills: 0    levofloxacin (LEVAQUIN) 750 MG tablet Take 1 tablet (750 mg total) by mouth daily. Qty: 6 tablet, Refills: 0      CONTINUE these medications which have CHANGED   Details  HYDROcodone-acetaminophen (NORCO) 10-325 MG per tablet Take 1 tablet by mouth every 6 (six) hours as needed for pain. For pain Qty: 45 tablet, Refills: 0      CONTINUE these medications which have NOT CHANGED   Details  albuterol (PROVENTIL HFA;VENTOLIN HFA) 108 (90 BASE) MCG/ACT inhaler Inhale 2 puffs into the lungs every 6 (six) hours as needed. For shortness of breath     aspirin EC 81 MG tablet Take 81 mg by mouth daily.      carvedilol (COREG) 6.25 MG tablet Take 6.25 mg by mouth 2 (two) times daily with a meal.       folic acid (FOLVITE) 1 MG tablet Take 1 mg by mouth daily.      glimepiride (AMARYL) 4 MG tablet Take 4 mg by mouth daily before breakfast.      hydrochlorothiazide (HYDRODIURIL) 25 MG tablet Take 25 mg by mouth daily.      levothyroxine (SYNTHROID, LEVOTHROID) 100 MCG tablet Take 100 mcg by mouth daily.      magnesium oxide (MAG-OX) 400 MG tablet Take 400 mg by mouth daily.      meclizine (ANTIVERT) 25 MG tablet Take 25 mg by mouth 3 (three) times daily as needed. For dizziness     methotrexate (RHEUMATREX) 2.5 MG tablet Take 15 mg by mouth once a week. Caution:Chemotherapy. Protect from light.     mometasone (NASONEX) 50 MCG/ACT nasal spray Place 2 sprays into the nose daily.      Multiple Vitamins-Minerals (MULTIVITAMINS THER. W/MINERALS) TABS Take 1 tablet by mouth daily.      NITROGLYCERIN RE Place 1 application rectally 3 (three) times daily.      omega-3 acid ethyl esters (LOVAZA) 1 G capsule Take by mouth 2 (two) times daily.      pregabalin (LYRICA) 50 MG capsule Take 50 mg by mouth 4 (four) times daily.      ranitidine (ZANTAC) 150 MG capsule Take 150 mg by mouth every evening.      sitaGLIPtan-metformin (JANUMET) 50-1000 MG per tablet Take 1 tablet by mouth 2 (two) times daily with a meal.        STOP taking these medications     oxyCODONE-acetaminophen (PERCOCET) 5-325 MG per tablet  Disposition and Follow-up:  Patient has been discharged in stable and improved condition, currently not complaining of any shortness of breath, fever, chest pain or significant cough. Patient vital signs stable and the patient is in no acute distress. He has been instructed to follow with primary care physician over the next 10 days in order to evaluate complete resolution of his symptoms and to continue treatment of his chronic problems. He will see his rheumatologist on 04/10/11 for further treatment of his rheumatoid arthritis. Patient instructed to follow a low  carbohydrate/low sodium/low fat diet. He will continue taking his antibiotics as prescribed for 6 more days. Patient also instructed to continue using incentive spirometry in order to avoid atelectasis and to continue exercising his lungs.   Consults:   None.  Significant Diagnostic Studies:  Dg Chest 2 View  04/06/2011  *RADIOLOGY REPORT*  Clinical Data: Shortness of breath and cough; follow up infiltrates suggested on prior portable exam.  PA and lateral films recommended for further evaluation.  CHEST - 2 VIEW  Comparison: Chest radiograph performed 04/05/2011  Findings: The lungs are well-aerated.  Patchy airspace opacification is noted at the left lung base, and lesser extent at the right lung base.  Mild opacity is also seen at the left midlung zone.  Findings are compatible with multifocal pneumonia.  There is no evidence of pleural effusion or pneumothorax.  The heart is borderline enlarged; mild vascular congestion is noted.  No acute osseous abnormalities are seen.  IMPRESSION:  1.  Patchy bilateral airspace opacification, most prominent at the left lung base, concerning for multifocal pneumonia. 2.  Borderline cardiomegaly and mild vascular congestion noted.  Original Report Authenticated By: Tonia Ghent, M.D.   Dg Chest Portable 1 View  04/05/2011  *RADIOLOGY REPORT*  Clinical Data: Chest pain.  Cough and fever.  Short of breath and weakness.  PORTABLE CHEST - 1 VIEW  Comparison: 05/24/2010.  Findings: Cardiopericardial silhouette remains enlarged, similar to prior.  The exam is under penetrated.  There may be airspace disease and / or atelectasis in the retrocardiac region.  No effusion is identified.  Monitoring leads are projected over the chest.  Mediastinal contours are unchanged allowing for rotation and apical lordotic projection.  IMPRESSION:  1.  Under penetrated exam.  Possible retrocardiac/left lower lobe airspace disease.  Consider follow-up PA and lateral radiograph. 2.   Unchanged cardiomegaly.  Original Report Authenticated By: Andreas Newport, M.D.    Brief H and P: 65 year old, caucasian and obese male with past medical history significant for hypertension, dyslipidemia, diabetes, rheumatoid arthritis, hypothyroidism, GERD, and non-obstructive coronary artery disease (status post catheterization in 2009 demonstrating 40% occlusion of LAD); who came into the hospital complaining of shortness of breath, pleuritic chest pain and cough for the last 2-3 days. Patient reports that about week ago he started noticing some persistent non-productive cough, Which at that moment was attributed to the use of Enbrel; reumatologist discontinue Enbrel thinking that his cough was a side effect of the medication; but despite discontinuation of the medication patient continued to experienced cough and 2-3 days ago started noticing associated chills, pleuritic chest discomfort and shortness of breath. At that moment patient saw primary care physician who prescribed Nasonex and also albuterol without significant improvement of his symptoms. Patient came to emergency department for further evaluation. In ED and x-ray demonstrated acute infiltrates of his left long [place] cardiac aspect, he was febrile with a temperature up to 103.1, elevated WBC's at 14,000 and he was tachypneic and  tachycardic. [facility] hospitalist has been called to admit the patient for further evaluation and treatment.   Hospital Course:  1-shortness of breath secondary to community acquired pneumonia: Good oxygen saturation on room air, no significant cough, no chest pain. Patient afebrile for the last 48 hours and with WBCs within normal limits. He will take his antibiotic as prescribed for 6 more days to complete therapeutic regimen and will follow with PCP over the next 10 days.  2-diabetes mellitus (A1c 7.3): Stable continue current hypoglycemic agents and follow with PCP for further adjustment of his  medications.  3-hypertension: Well control will continue current antihypertensive drugs. Patient advised to follow a low-sodium heart healthy diet.  4-tachycardia and fever secondary to problem #1.: Secondary to dehydration and infection. After fluid resuscitation his antibiotics given patient no longer tachycardic or having fevers.  5-Hypothyroidism: TSH within normal limits; will continue lvothyroxine at the same dose.  6-GERD: Continue Zantac.  7-rheumatoid arthritis: Patient will follow with his rheumatologist for further treatment of his rheumatoid arthritis. At this point he is going to resume methotrexate as prescribed prior to admission.  8-history of coronary artery disease: Cardiac enzymes negative x3, no abnormalities on and off sinus tachycardia on his EKG, no telemetry abnormalities. Patient will continue using aspirin and coreg.  9-chronic constipation: Secondary to his hypothyroidism, continue when necessary laxatives.   10-dyslipidemia: Patient instructed to follow a low-fat diet and to continue using lovaza.   Time spent on Discharge: 50 minutes.  Signed: Keiasia Christianson 04/08/2011, 2:23 PM

## 2011-04-08 NOTE — Progress Notes (Signed)
Joshua Lara to be D/C'd Home per MD order.  Discussed D/C instructions with the patient and all questions fully answered.   [name], [name]  Home Medication Instructions ZOX:[id number]   Printed on:04/08/11 1556  Medication Information                    meclizine (ANTIVERT) 25 MG tablet Take 25 mg by mouth 3 (three) times daily as needed. For dizziness            NITROGLYCERIN RE Place 1 application rectally 3 (three) times daily.             albuterol (PROVENTIL HFA;VENTOLIN HFA) 108 (90 BASE) MCG/ACT inhaler Inhale 2 puffs into the lungs every 6 (six) hours as needed. For shortness of breath            folic acid (FOLVITE) 1 MG tablet Take 1 mg by mouth daily.             Multiple Vitamins-Minerals (MULTIVITAMINS THER. W/MINERALS) TABS Take 1 tablet by mouth daily.             carvedilol (COREG) 6.25 MG tablet Take 6.25 mg by mouth 2 (two) times daily with a meal.             aspirin EC 81 MG tablet Take 81 mg by mouth daily.             levothyroxine (SYNTHROID, LEVOTHROID) 100 MCG tablet Take 100 mcg by mouth daily.             hydrochlorothiazide (HYDRODIURIL) 25 MG tablet Take 25 mg by mouth daily.             mometasone (NASONEX) 50 MCG/ACT nasal spray Place 2 sprays into the nose daily.             omega-3 acid ethyl esters (LOVAZA) 1 G capsule Take by mouth 2 (two) times daily.             sitaGLIPtan-metformin (JANUMET) 50-1000 MG per tablet Take 1 tablet by mouth 2 (two) times daily with a meal.             glimepiride (AMARYL) 4 MG tablet Take 4 mg by mouth daily before breakfast.             ranitidine (ZANTAC) 150 MG capsule Take 150 mg by mouth every evening.             magnesium oxide (MAG-OX) 400 MG tablet Take 400 mg by mouth daily.             pregabalin (LYRICA) 50 MG capsule Take 50 mg by mouth 4 (four) times daily.             methotrexate (RHEUMATREX) 2.5 MG tablet Take 15 mg by mouth once a week. Caution:Chemotherapy. Protect from  light.            acetaminophen (TYLENOL) 325 MG tablet Take 2 tablets (650 mg total) by mouth every 6 (six) hours as needed (or Fever >/= 101).           benzonatate (TESSALON) 100 MG capsule Take 1 capsule (100 mg total) by mouth 3 (three) times daily as needed for cough.           HYDROcodone-acetaminophen (NORCO) 10-325 MG per tablet Take 1 tablet by mouth every 6 (six) hours as needed for pain. For pain           levofloxacin (LEVAQUIN)  750 MG tablet Take 1 tablet (750 mg total) by mouth daily.             VVS, Skin clean, dry and intact without evidence of skin break down, no evidence of skin tears noted. IV catheter discontinued intact. Site without signs and symptoms of complications. Dressing and pressure applied.  An After Visit Summary was printed and given to the patient.  Patient given medication prescriptions, and was given an incentive spirometer to take home.   Patient escorted via WC, and D/C home via private auto.  [name] 04/08/2011 3:56 PM

## 2011-04-11 LAB — CULTURE, BLOOD (ROUTINE X 2)
Culture  Setup Time: 201211242009
Culture: NO GROWTH

## 2011-09-19 DIAGNOSIS — M069 Rheumatoid arthritis, unspecified: Secondary | ICD-10-CM | POA: Diagnosis not present

## 2011-10-08 DIAGNOSIS — E291 Testicular hypofunction: Secondary | ICD-10-CM | POA: Diagnosis not present

## 2011-10-10 DIAGNOSIS — H4011X0 Primary open-angle glaucoma, stage unspecified: Secondary | ICD-10-CM | POA: Diagnosis not present

## 2011-10-10 DIAGNOSIS — M069 Rheumatoid arthritis, unspecified: Secondary | ICD-10-CM | POA: Diagnosis not present

## 2011-10-10 DIAGNOSIS — H2510 Age-related nuclear cataract, unspecified eye: Secondary | ICD-10-CM | POA: Diagnosis not present

## 2011-10-10 DIAGNOSIS — R0609 Other forms of dyspnea: Secondary | ICD-10-CM | POA: Diagnosis not present

## 2011-10-10 DIAGNOSIS — R0989 Other specified symptoms and signs involving the circulatory and respiratory systems: Secondary | ICD-10-CM | POA: Diagnosis not present

## 2011-10-17 DIAGNOSIS — M069 Rheumatoid arthritis, unspecified: Secondary | ICD-10-CM | POA: Diagnosis not present

## 2011-10-20 DIAGNOSIS — E11359 Type 2 diabetes mellitus with proliferative diabetic retinopathy without macular edema: Secondary | ICD-10-CM | POA: Diagnosis not present

## 2011-10-20 DIAGNOSIS — H4011X0 Primary open-angle glaucoma, stage unspecified: Secondary | ICD-10-CM | POA: Diagnosis not present

## 2011-10-20 DIAGNOSIS — H2510 Age-related nuclear cataract, unspecified eye: Secondary | ICD-10-CM | POA: Diagnosis not present

## 2011-10-24 DIAGNOSIS — E291 Testicular hypofunction: Secondary | ICD-10-CM | POA: Diagnosis not present

## 2011-11-03 DIAGNOSIS — M545 Low back pain, unspecified: Secondary | ICD-10-CM | POA: Diagnosis not present

## 2011-11-06 DIAGNOSIS — E291 Testicular hypofunction: Secondary | ICD-10-CM | POA: Diagnosis not present

## 2011-11-10 DIAGNOSIS — M5430 Sciatica, unspecified side: Secondary | ICD-10-CM | POA: Diagnosis not present

## 2011-11-10 DIAGNOSIS — M999 Biomechanical lesion, unspecified: Secondary | ICD-10-CM | POA: Diagnosis not present

## 2011-11-18 DIAGNOSIS — M069 Rheumatoid arthritis, unspecified: Secondary | ICD-10-CM | POA: Diagnosis not present

## 2011-11-25 DIAGNOSIS — E291 Testicular hypofunction: Secondary | ICD-10-CM | POA: Diagnosis not present

## 2011-11-27 DIAGNOSIS — E119 Type 2 diabetes mellitus without complications: Secondary | ICD-10-CM | POA: Diagnosis not present

## 2011-11-27 DIAGNOSIS — Z0000 Encounter for general adult medical examination without abnormal findings: Secondary | ICD-10-CM | POA: Diagnosis not present

## 2011-11-27 DIAGNOSIS — Z125 Encounter for screening for malignant neoplasm of prostate: Secondary | ICD-10-CM | POA: Diagnosis not present

## 2011-11-27 DIAGNOSIS — Z6837 Body mass index (BMI) 37.0-37.9, adult: Secondary | ICD-10-CM | POA: Diagnosis not present

## 2011-11-27 DIAGNOSIS — E291 Testicular hypofunction: Secondary | ICD-10-CM | POA: Diagnosis not present

## 2011-12-03 DIAGNOSIS — M5430 Sciatica, unspecified side: Secondary | ICD-10-CM | POA: Diagnosis not present

## 2011-12-03 DIAGNOSIS — L02419 Cutaneous abscess of limb, unspecified: Secondary | ICD-10-CM | POA: Diagnosis not present

## 2011-12-03 DIAGNOSIS — L03119 Cellulitis of unspecified part of limb: Secondary | ICD-10-CM | POA: Diagnosis not present

## 2011-12-03 DIAGNOSIS — M999 Biomechanical lesion, unspecified: Secondary | ICD-10-CM | POA: Diagnosis not present

## 2011-12-04 DIAGNOSIS — I2510 Atherosclerotic heart disease of native coronary artery without angina pectoris: Secondary | ICD-10-CM | POA: Diagnosis not present

## 2011-12-04 DIAGNOSIS — I43 Cardiomyopathy in diseases classified elsewhere: Secondary | ICD-10-CM | POA: Diagnosis not present

## 2011-12-04 DIAGNOSIS — E7800 Pure hypercholesterolemia, unspecified: Secondary | ICD-10-CM | POA: Diagnosis not present

## 2011-12-04 DIAGNOSIS — I10 Essential (primary) hypertension: Secondary | ICD-10-CM | POA: Diagnosis not present

## 2011-12-04 DIAGNOSIS — G4733 Obstructive sleep apnea (adult) (pediatric): Secondary | ICD-10-CM | POA: Diagnosis not present

## 2011-12-04 DIAGNOSIS — R0609 Other forms of dyspnea: Secondary | ICD-10-CM | POA: Diagnosis not present

## 2011-12-04 DIAGNOSIS — R0989 Other specified symptoms and signs involving the circulatory and respiratory systems: Secondary | ICD-10-CM | POA: Diagnosis not present

## 2011-12-05 DIAGNOSIS — L02419 Cutaneous abscess of limb, unspecified: Secondary | ICD-10-CM | POA: Diagnosis not present

## 2011-12-05 DIAGNOSIS — L03119 Cellulitis of unspecified part of limb: Secondary | ICD-10-CM | POA: Diagnosis not present

## 2011-12-15 DIAGNOSIS — E291 Testicular hypofunction: Secondary | ICD-10-CM | POA: Diagnosis not present

## 2011-12-16 DIAGNOSIS — L4050 Arthropathic psoriasis, unspecified: Secondary | ICD-10-CM | POA: Diagnosis not present

## 2011-12-16 DIAGNOSIS — M999 Biomechanical lesion, unspecified: Secondary | ICD-10-CM | POA: Diagnosis not present

## 2011-12-16 DIAGNOSIS — M5430 Sciatica, unspecified side: Secondary | ICD-10-CM | POA: Diagnosis not present

## 2011-12-16 DIAGNOSIS — M069 Rheumatoid arthritis, unspecified: Secondary | ICD-10-CM | POA: Diagnosis not present

## 2011-12-22 DIAGNOSIS — L03119 Cellulitis of unspecified part of limb: Secondary | ICD-10-CM | POA: Diagnosis not present

## 2011-12-22 DIAGNOSIS — L02419 Cutaneous abscess of limb, unspecified: Secondary | ICD-10-CM | POA: Diagnosis not present

## 2011-12-24 DIAGNOSIS — M999 Biomechanical lesion, unspecified: Secondary | ICD-10-CM | POA: Diagnosis not present

## 2011-12-24 DIAGNOSIS — IMO0002 Reserved for concepts with insufficient information to code with codable children: Secondary | ICD-10-CM | POA: Diagnosis not present

## 2011-12-24 DIAGNOSIS — M79609 Pain in unspecified limb: Secondary | ICD-10-CM | POA: Diagnosis not present

## 2011-12-24 DIAGNOSIS — R0989 Other specified symptoms and signs involving the circulatory and respiratory systems: Secondary | ICD-10-CM | POA: Diagnosis not present

## 2011-12-24 DIAGNOSIS — M069 Rheumatoid arthritis, unspecified: Secondary | ICD-10-CM | POA: Diagnosis not present

## 2011-12-24 DIAGNOSIS — R0609 Other forms of dyspnea: Secondary | ICD-10-CM | POA: Diagnosis not present

## 2011-12-24 DIAGNOSIS — M5430 Sciatica, unspecified side: Secondary | ICD-10-CM | POA: Diagnosis not present

## 2011-12-29 DIAGNOSIS — M999 Biomechanical lesion, unspecified: Secondary | ICD-10-CM | POA: Diagnosis not present

## 2011-12-29 DIAGNOSIS — E291 Testicular hypofunction: Secondary | ICD-10-CM | POA: Diagnosis not present

## 2011-12-29 DIAGNOSIS — M5430 Sciatica, unspecified side: Secondary | ICD-10-CM | POA: Diagnosis not present

## 2012-01-06 DIAGNOSIS — G4710 Hypersomnia, unspecified: Secondary | ICD-10-CM | POA: Diagnosis not present

## 2012-01-06 DIAGNOSIS — G4730 Sleep apnea, unspecified: Secondary | ICD-10-CM | POA: Diagnosis not present

## 2012-01-07 DIAGNOSIS — M999 Biomechanical lesion, unspecified: Secondary | ICD-10-CM | POA: Diagnosis not present

## 2012-01-07 DIAGNOSIS — M5430 Sciatica, unspecified side: Secondary | ICD-10-CM | POA: Diagnosis not present

## 2012-01-13 DIAGNOSIS — M069 Rheumatoid arthritis, unspecified: Secondary | ICD-10-CM | POA: Diagnosis not present

## 2012-01-14 DIAGNOSIS — E291 Testicular hypofunction: Secondary | ICD-10-CM | POA: Diagnosis not present

## 2012-01-21 DIAGNOSIS — M5430 Sciatica, unspecified side: Secondary | ICD-10-CM | POA: Diagnosis not present

## 2012-01-21 DIAGNOSIS — M999 Biomechanical lesion, unspecified: Secondary | ICD-10-CM | POA: Diagnosis not present

## 2012-01-28 DIAGNOSIS — L02419 Cutaneous abscess of limb, unspecified: Secondary | ICD-10-CM | POA: Diagnosis not present

## 2012-01-28 DIAGNOSIS — Z23 Encounter for immunization: Secondary | ICD-10-CM | POA: Diagnosis not present

## 2012-02-04 DIAGNOSIS — M5430 Sciatica, unspecified side: Secondary | ICD-10-CM | POA: Diagnosis not present

## 2012-02-04 DIAGNOSIS — M999 Biomechanical lesion, unspecified: Secondary | ICD-10-CM | POA: Diagnosis not present

## 2012-02-10 DIAGNOSIS — R7989 Other specified abnormal findings of blood chemistry: Secondary | ICD-10-CM | POA: Diagnosis not present

## 2012-02-10 DIAGNOSIS — M069 Rheumatoid arthritis, unspecified: Secondary | ICD-10-CM | POA: Diagnosis not present

## 2012-02-12 DIAGNOSIS — E291 Testicular hypofunction: Secondary | ICD-10-CM | POA: Diagnosis not present

## 2012-02-18 DIAGNOSIS — M5430 Sciatica, unspecified side: Secondary | ICD-10-CM | POA: Diagnosis not present

## 2012-02-18 DIAGNOSIS — M999 Biomechanical lesion, unspecified: Secondary | ICD-10-CM | POA: Diagnosis not present

## 2012-02-19 DIAGNOSIS — IMO0002 Reserved for concepts with insufficient information to code with codable children: Secondary | ICD-10-CM | POA: Diagnosis not present

## 2012-02-19 DIAGNOSIS — M069 Rheumatoid arthritis, unspecified: Secondary | ICD-10-CM | POA: Diagnosis not present

## 2012-02-19 DIAGNOSIS — R7989 Other specified abnormal findings of blood chemistry: Secondary | ICD-10-CM | POA: Diagnosis not present

## 2012-02-19 DIAGNOSIS — R0609 Other forms of dyspnea: Secondary | ICD-10-CM | POA: Diagnosis not present

## 2012-02-19 DIAGNOSIS — R0989 Other specified symptoms and signs involving the circulatory and respiratory systems: Secondary | ICD-10-CM | POA: Diagnosis not present

## 2012-03-02 DIAGNOSIS — Z79899 Other long term (current) drug therapy: Secondary | ICD-10-CM | POA: Diagnosis not present

## 2012-03-02 DIAGNOSIS — E782 Mixed hyperlipidemia: Secondary | ICD-10-CM | POA: Diagnosis not present

## 2012-03-02 DIAGNOSIS — I2510 Atherosclerotic heart disease of native coronary artery without angina pectoris: Secondary | ICD-10-CM | POA: Diagnosis not present

## 2012-03-02 DIAGNOSIS — E291 Testicular hypofunction: Secondary | ICD-10-CM | POA: Diagnosis not present

## 2012-03-02 DIAGNOSIS — H6120 Impacted cerumen, unspecified ear: Secondary | ICD-10-CM | POA: Diagnosis not present

## 2012-03-02 DIAGNOSIS — IMO0001 Reserved for inherently not codable concepts without codable children: Secondary | ICD-10-CM | POA: Diagnosis not present

## 2012-03-03 DIAGNOSIS — M5430 Sciatica, unspecified side: Secondary | ICD-10-CM | POA: Diagnosis not present

## 2012-03-03 DIAGNOSIS — M999 Biomechanical lesion, unspecified: Secondary | ICD-10-CM | POA: Diagnosis not present

## 2012-03-09 DIAGNOSIS — M069 Rheumatoid arthritis, unspecified: Secondary | ICD-10-CM | POA: Diagnosis not present

## 2012-03-16 DIAGNOSIS — E291 Testicular hypofunction: Secondary | ICD-10-CM | POA: Diagnosis not present

## 2012-03-18 DIAGNOSIS — E11359 Type 2 diabetes mellitus with proliferative diabetic retinopathy without macular edema: Secondary | ICD-10-CM | POA: Diagnosis not present

## 2012-03-18 DIAGNOSIS — H2510 Age-related nuclear cataract, unspecified eye: Secondary | ICD-10-CM | POA: Diagnosis not present

## 2012-03-18 DIAGNOSIS — H4011X0 Primary open-angle glaucoma, stage unspecified: Secondary | ICD-10-CM | POA: Diagnosis not present

## 2012-04-01 DIAGNOSIS — E291 Testicular hypofunction: Secondary | ICD-10-CM | POA: Diagnosis not present

## 2012-04-06 DIAGNOSIS — M069 Rheumatoid arthritis, unspecified: Secondary | ICD-10-CM | POA: Diagnosis not present

## 2012-04-15 DIAGNOSIS — E291 Testicular hypofunction: Secondary | ICD-10-CM | POA: Diagnosis not present

## 2012-04-20 DIAGNOSIS — R0609 Other forms of dyspnea: Secondary | ICD-10-CM | POA: Diagnosis not present

## 2012-04-20 DIAGNOSIS — R0989 Other specified symptoms and signs involving the circulatory and respiratory systems: Secondary | ICD-10-CM | POA: Diagnosis not present

## 2012-04-20 DIAGNOSIS — M069 Rheumatoid arthritis, unspecified: Secondary | ICD-10-CM | POA: Diagnosis not present

## 2012-04-20 DIAGNOSIS — R7989 Other specified abnormal findings of blood chemistry: Secondary | ICD-10-CM | POA: Diagnosis not present

## 2012-04-20 DIAGNOSIS — IMO0002 Reserved for concepts with insufficient information to code with codable children: Secondary | ICD-10-CM | POA: Diagnosis not present

## 2012-04-22 DIAGNOSIS — H4011X0 Primary open-angle glaucoma, stage unspecified: Secondary | ICD-10-CM | POA: Diagnosis not present

## 2012-04-23 DIAGNOSIS — IMO0001 Reserved for inherently not codable concepts without codable children: Secondary | ICD-10-CM | POA: Diagnosis not present

## 2012-04-26 DIAGNOSIS — M5430 Sciatica, unspecified side: Secondary | ICD-10-CM | POA: Diagnosis not present

## 2012-04-26 DIAGNOSIS — M999 Biomechanical lesion, unspecified: Secondary | ICD-10-CM | POA: Diagnosis not present

## 2012-04-29 DIAGNOSIS — E291 Testicular hypofunction: Secondary | ICD-10-CM | POA: Diagnosis not present

## 2012-05-06 DIAGNOSIS — M069 Rheumatoid arthritis, unspecified: Secondary | ICD-10-CM | POA: Diagnosis not present

## 2012-05-13 DIAGNOSIS — E291 Testicular hypofunction: Secondary | ICD-10-CM | POA: Diagnosis not present

## 2012-05-27 DIAGNOSIS — E291 Testicular hypofunction: Secondary | ICD-10-CM | POA: Diagnosis not present

## 2012-06-03 DIAGNOSIS — I2510 Atherosclerotic heart disease of native coronary artery without angina pectoris: Secondary | ICD-10-CM | POA: Diagnosis not present

## 2012-06-03 DIAGNOSIS — Z23 Encounter for immunization: Secondary | ICD-10-CM | POA: Diagnosis not present

## 2012-06-03 DIAGNOSIS — M545 Low back pain, unspecified: Secondary | ICD-10-CM | POA: Diagnosis not present

## 2012-06-03 DIAGNOSIS — E782 Mixed hyperlipidemia: Secondary | ICD-10-CM | POA: Diagnosis not present

## 2012-06-03 DIAGNOSIS — E119 Type 2 diabetes mellitus without complications: Secondary | ICD-10-CM | POA: Diagnosis not present

## 2012-06-03 DIAGNOSIS — E291 Testicular hypofunction: Secondary | ICD-10-CM | POA: Diagnosis not present

## 2012-06-08 DIAGNOSIS — M069 Rheumatoid arthritis, unspecified: Secondary | ICD-10-CM | POA: Diagnosis not present

## 2012-06-10 DIAGNOSIS — H4011X0 Primary open-angle glaucoma, stage unspecified: Secondary | ICD-10-CM | POA: Diagnosis not present

## 2012-06-10 DIAGNOSIS — E119 Type 2 diabetes mellitus without complications: Secondary | ICD-10-CM | POA: Diagnosis not present

## 2012-06-10 DIAGNOSIS — E291 Testicular hypofunction: Secondary | ICD-10-CM | POA: Diagnosis not present

## 2012-06-10 DIAGNOSIS — H2510 Age-related nuclear cataract, unspecified eye: Secondary | ICD-10-CM | POA: Diagnosis not present

## 2012-06-16 DIAGNOSIS — H2510 Age-related nuclear cataract, unspecified eye: Secondary | ICD-10-CM | POA: Diagnosis not present

## 2012-06-16 DIAGNOSIS — H4011X0 Primary open-angle glaucoma, stage unspecified: Secondary | ICD-10-CM | POA: Diagnosis not present

## 2012-06-22 DIAGNOSIS — M999 Biomechanical lesion, unspecified: Secondary | ICD-10-CM | POA: Diagnosis not present

## 2012-06-22 DIAGNOSIS — M5430 Sciatica, unspecified side: Secondary | ICD-10-CM | POA: Diagnosis not present

## 2012-06-29 DIAGNOSIS — E291 Testicular hypofunction: Secondary | ICD-10-CM | POA: Diagnosis not present

## 2012-07-08 DIAGNOSIS — E291 Testicular hypofunction: Secondary | ICD-10-CM | POA: Diagnosis not present

## 2012-07-08 DIAGNOSIS — M069 Rheumatoid arthritis, unspecified: Secondary | ICD-10-CM | POA: Diagnosis not present

## 2012-07-20 DIAGNOSIS — R0609 Other forms of dyspnea: Secondary | ICD-10-CM | POA: Diagnosis not present

## 2012-07-20 DIAGNOSIS — IMO0002 Reserved for concepts with insufficient information to code with codable children: Secondary | ICD-10-CM | POA: Diagnosis not present

## 2012-07-20 DIAGNOSIS — R7989 Other specified abnormal findings of blood chemistry: Secondary | ICD-10-CM | POA: Diagnosis not present

## 2012-07-20 DIAGNOSIS — M069 Rheumatoid arthritis, unspecified: Secondary | ICD-10-CM | POA: Diagnosis not present

## 2012-07-22 DIAGNOSIS — E291 Testicular hypofunction: Secondary | ICD-10-CM | POA: Diagnosis not present

## 2012-08-02 DIAGNOSIS — J189 Pneumonia, unspecified organism: Secondary | ICD-10-CM | POA: Diagnosis not present

## 2012-08-05 DIAGNOSIS — E291 Testicular hypofunction: Secondary | ICD-10-CM | POA: Diagnosis not present

## 2012-08-19 DIAGNOSIS — M069 Rheumatoid arthritis, unspecified: Secondary | ICD-10-CM | POA: Diagnosis not present

## 2012-09-02 DIAGNOSIS — E291 Testicular hypofunction: Secondary | ICD-10-CM | POA: Diagnosis not present

## 2012-09-07 DIAGNOSIS — I2510 Atherosclerotic heart disease of native coronary artery without angina pectoris: Secondary | ICD-10-CM | POA: Diagnosis not present

## 2012-09-07 DIAGNOSIS — Z1331 Encounter for screening for depression: Secondary | ICD-10-CM | POA: Diagnosis not present

## 2012-09-07 DIAGNOSIS — Z79899 Other long term (current) drug therapy: Secondary | ICD-10-CM | POA: Diagnosis not present

## 2012-09-07 DIAGNOSIS — E782 Mixed hyperlipidemia: Secondary | ICD-10-CM | POA: Diagnosis not present

## 2012-09-07 DIAGNOSIS — IMO0001 Reserved for inherently not codable concepts without codable children: Secondary | ICD-10-CM | POA: Diagnosis not present

## 2012-09-16 DIAGNOSIS — E291 Testicular hypofunction: Secondary | ICD-10-CM | POA: Diagnosis not present

## 2012-09-16 DIAGNOSIS — H4011X0 Primary open-angle glaucoma, stage unspecified: Secondary | ICD-10-CM | POA: Diagnosis not present

## 2012-09-16 DIAGNOSIS — H18419 Arcus senilis, unspecified eye: Secondary | ICD-10-CM | POA: Diagnosis not present

## 2012-09-16 DIAGNOSIS — M069 Rheumatoid arthritis, unspecified: Secondary | ICD-10-CM | POA: Diagnosis not present

## 2012-09-16 DIAGNOSIS — E119 Type 2 diabetes mellitus without complications: Secondary | ICD-10-CM | POA: Diagnosis not present

## 2012-09-21 DIAGNOSIS — E119 Type 2 diabetes mellitus without complications: Secondary | ICD-10-CM | POA: Diagnosis not present

## 2012-09-21 DIAGNOSIS — H4011X0 Primary open-angle glaucoma, stage unspecified: Secondary | ICD-10-CM | POA: Diagnosis not present

## 2012-09-22 DIAGNOSIS — M5430 Sciatica, unspecified side: Secondary | ICD-10-CM | POA: Diagnosis not present

## 2012-09-22 DIAGNOSIS — M999 Biomechanical lesion, unspecified: Secondary | ICD-10-CM | POA: Diagnosis not present

## 2012-09-30 DIAGNOSIS — E291 Testicular hypofunction: Secondary | ICD-10-CM | POA: Diagnosis not present

## 2012-10-14 DIAGNOSIS — E291 Testicular hypofunction: Secondary | ICD-10-CM | POA: Diagnosis not present

## 2012-10-14 DIAGNOSIS — M069 Rheumatoid arthritis, unspecified: Secondary | ICD-10-CM | POA: Diagnosis not present

## 2012-10-21 DIAGNOSIS — M5430 Sciatica, unspecified side: Secondary | ICD-10-CM | POA: Diagnosis not present

## 2012-10-21 DIAGNOSIS — M999 Biomechanical lesion, unspecified: Secondary | ICD-10-CM | POA: Diagnosis not present

## 2012-10-26 DIAGNOSIS — G4710 Hypersomnia, unspecified: Secondary | ICD-10-CM | POA: Diagnosis not present

## 2012-10-26 DIAGNOSIS — G4730 Sleep apnea, unspecified: Secondary | ICD-10-CM | POA: Diagnosis not present

## 2012-11-11 DIAGNOSIS — M069 Rheumatoid arthritis, unspecified: Secondary | ICD-10-CM | POA: Diagnosis not present

## 2012-11-22 DIAGNOSIS — M5430 Sciatica, unspecified side: Secondary | ICD-10-CM | POA: Diagnosis not present

## 2012-11-22 DIAGNOSIS — M999 Biomechanical lesion, unspecified: Secondary | ICD-10-CM | POA: Diagnosis not present

## 2012-11-23 DIAGNOSIS — IMO0002 Reserved for concepts with insufficient information to code with codable children: Secondary | ICD-10-CM | POA: Diagnosis not present

## 2012-11-23 DIAGNOSIS — M79609 Pain in unspecified limb: Secondary | ICD-10-CM | POA: Diagnosis not present

## 2012-11-23 DIAGNOSIS — H4011X0 Primary open-angle glaucoma, stage unspecified: Secondary | ICD-10-CM | POA: Diagnosis not present

## 2012-11-23 DIAGNOSIS — M069 Rheumatoid arthritis, unspecified: Secondary | ICD-10-CM | POA: Diagnosis not present

## 2012-11-23 DIAGNOSIS — R7989 Other specified abnormal findings of blood chemistry: Secondary | ICD-10-CM | POA: Diagnosis not present

## 2012-11-23 DIAGNOSIS — H11159 Pinguecula, unspecified eye: Secondary | ICD-10-CM | POA: Diagnosis not present

## 2012-12-02 DIAGNOSIS — R0602 Shortness of breath: Secondary | ICD-10-CM | POA: Diagnosis not present

## 2012-12-02 DIAGNOSIS — I43 Cardiomyopathy in diseases classified elsewhere: Secondary | ICD-10-CM | POA: Diagnosis not present

## 2012-12-02 DIAGNOSIS — I509 Heart failure, unspecified: Secondary | ICD-10-CM | POA: Diagnosis not present

## 2012-12-07 DIAGNOSIS — I2510 Atherosclerotic heart disease of native coronary artery without angina pectoris: Secondary | ICD-10-CM | POA: Diagnosis not present

## 2012-12-07 DIAGNOSIS — IMO0001 Reserved for inherently not codable concepts without codable children: Secondary | ICD-10-CM | POA: Diagnosis not present

## 2012-12-07 DIAGNOSIS — Z125 Encounter for screening for malignant neoplasm of prostate: Secondary | ICD-10-CM | POA: Diagnosis not present

## 2012-12-07 DIAGNOSIS — Z79899 Other long term (current) drug therapy: Secondary | ICD-10-CM | POA: Diagnosis not present

## 2012-12-07 DIAGNOSIS — E039 Hypothyroidism, unspecified: Secondary | ICD-10-CM | POA: Diagnosis not present

## 2012-12-07 DIAGNOSIS — E782 Mixed hyperlipidemia: Secondary | ICD-10-CM | POA: Diagnosis not present

## 2012-12-09 DIAGNOSIS — M069 Rheumatoid arthritis, unspecified: Secondary | ICD-10-CM | POA: Diagnosis not present

## 2012-12-09 DIAGNOSIS — E662 Morbid (severe) obesity with alveolar hypoventilation: Secondary | ICD-10-CM | POA: Diagnosis not present

## 2012-12-09 DIAGNOSIS — G4733 Obstructive sleep apnea (adult) (pediatric): Secondary | ICD-10-CM | POA: Diagnosis not present

## 2012-12-09 DIAGNOSIS — I43 Cardiomyopathy in diseases classified elsewhere: Secondary | ICD-10-CM | POA: Diagnosis not present

## 2012-12-23 DIAGNOSIS — M999 Biomechanical lesion, unspecified: Secondary | ICD-10-CM | POA: Diagnosis not present

## 2012-12-23 DIAGNOSIS — M5430 Sciatica, unspecified side: Secondary | ICD-10-CM | POA: Diagnosis not present

## 2013-01-11 DIAGNOSIS — M069 Rheumatoid arthritis, unspecified: Secondary | ICD-10-CM | POA: Diagnosis not present

## 2013-01-19 DIAGNOSIS — M999 Biomechanical lesion, unspecified: Secondary | ICD-10-CM | POA: Diagnosis not present

## 2013-01-19 DIAGNOSIS — M5430 Sciatica, unspecified side: Secondary | ICD-10-CM | POA: Diagnosis not present

## 2013-01-20 DIAGNOSIS — K602 Anal fissure, unspecified: Secondary | ICD-10-CM | POA: Diagnosis not present

## 2013-01-20 DIAGNOSIS — K645 Perianal venous thrombosis: Secondary | ICD-10-CM | POA: Diagnosis not present

## 2013-01-20 DIAGNOSIS — K6289 Other specified diseases of anus and rectum: Secondary | ICD-10-CM | POA: Diagnosis not present

## 2013-01-20 DIAGNOSIS — K648 Other hemorrhoids: Secondary | ICD-10-CM | POA: Diagnosis not present

## 2013-01-21 DIAGNOSIS — I10 Essential (primary) hypertension: Secondary | ICD-10-CM | POA: Diagnosis not present

## 2013-01-21 DIAGNOSIS — I2510 Atherosclerotic heart disease of native coronary artery without angina pectoris: Secondary | ICD-10-CM | POA: Diagnosis not present

## 2013-01-21 DIAGNOSIS — E119 Type 2 diabetes mellitus without complications: Secondary | ICD-10-CM | POA: Diagnosis not present

## 2013-01-21 DIAGNOSIS — E7800 Pure hypercholesterolemia, unspecified: Secondary | ICD-10-CM | POA: Diagnosis not present

## 2013-01-21 DIAGNOSIS — Z87891 Personal history of nicotine dependence: Secondary | ICD-10-CM | POA: Diagnosis not present

## 2013-01-21 DIAGNOSIS — K648 Other hemorrhoids: Secondary | ICD-10-CM | POA: Diagnosis not present

## 2013-01-21 DIAGNOSIS — Z7982 Long term (current) use of aspirin: Secondary | ICD-10-CM | POA: Diagnosis not present

## 2013-01-21 DIAGNOSIS — K645 Perianal venous thrombosis: Secondary | ICD-10-CM | POA: Diagnosis not present

## 2013-01-21 DIAGNOSIS — K602 Anal fissure, unspecified: Secondary | ICD-10-CM | POA: Diagnosis not present

## 2013-01-21 DIAGNOSIS — K6289 Other specified diseases of anus and rectum: Secondary | ICD-10-CM | POA: Diagnosis not present

## 2013-01-25 DIAGNOSIS — E1149 Type 2 diabetes mellitus with other diabetic neurological complication: Secondary | ICD-10-CM | POA: Diagnosis not present

## 2013-01-25 DIAGNOSIS — J449 Chronic obstructive pulmonary disease, unspecified: Secondary | ICD-10-CM | POA: Diagnosis not present

## 2013-01-25 DIAGNOSIS — M5137 Other intervertebral disc degeneration, lumbosacral region: Secondary | ICD-10-CM | POA: Diagnosis not present

## 2013-01-25 DIAGNOSIS — M545 Low back pain, unspecified: Secondary | ICD-10-CM | POA: Diagnosis not present

## 2013-01-25 DIAGNOSIS — M47817 Spondylosis without myelopathy or radiculopathy, lumbosacral region: Secondary | ICD-10-CM | POA: Diagnosis not present

## 2013-01-25 DIAGNOSIS — R071 Chest pain on breathing: Secondary | ICD-10-CM | POA: Diagnosis not present

## 2013-01-25 DIAGNOSIS — R0601 Orthopnea: Secondary | ICD-10-CM | POA: Diagnosis not present

## 2013-01-26 DIAGNOSIS — M999 Biomechanical lesion, unspecified: Secondary | ICD-10-CM | POA: Diagnosis not present

## 2013-01-26 DIAGNOSIS — M5430 Sciatica, unspecified side: Secondary | ICD-10-CM | POA: Diagnosis not present

## 2013-02-02 DIAGNOSIS — M999 Biomechanical lesion, unspecified: Secondary | ICD-10-CM | POA: Diagnosis not present

## 2013-02-02 DIAGNOSIS — M5430 Sciatica, unspecified side: Secondary | ICD-10-CM | POA: Diagnosis not present

## 2013-02-03 DIAGNOSIS — K602 Anal fissure, unspecified: Secondary | ICD-10-CM | POA: Diagnosis not present

## 2013-02-07 DIAGNOSIS — M545 Low back pain, unspecified: Secondary | ICD-10-CM | POA: Diagnosis not present

## 2013-02-07 DIAGNOSIS — Z23 Encounter for immunization: Secondary | ICD-10-CM | POA: Diagnosis not present

## 2013-02-07 DIAGNOSIS — I10 Essential (primary) hypertension: Secondary | ICD-10-CM | POA: Diagnosis not present

## 2013-02-07 DIAGNOSIS — IMO0001 Reserved for inherently not codable concepts without codable children: Secondary | ICD-10-CM | POA: Diagnosis not present

## 2013-02-10 DIAGNOSIS — M069 Rheumatoid arthritis, unspecified: Secondary | ICD-10-CM | POA: Diagnosis not present

## 2013-02-16 DIAGNOSIS — M5430 Sciatica, unspecified side: Secondary | ICD-10-CM | POA: Diagnosis not present

## 2013-02-16 DIAGNOSIS — M999 Biomechanical lesion, unspecified: Secondary | ICD-10-CM | POA: Diagnosis not present

## 2013-03-02 DIAGNOSIS — M999 Biomechanical lesion, unspecified: Secondary | ICD-10-CM | POA: Diagnosis not present

## 2013-03-02 DIAGNOSIS — M5430 Sciatica, unspecified side: Secondary | ICD-10-CM | POA: Diagnosis not present

## 2013-03-08 DIAGNOSIS — K602 Anal fissure, unspecified: Secondary | ICD-10-CM | POA: Diagnosis not present

## 2013-03-09 DIAGNOSIS — E119 Type 2 diabetes mellitus without complications: Secondary | ICD-10-CM | POA: Diagnosis not present

## 2013-03-10 DIAGNOSIS — M069 Rheumatoid arthritis, unspecified: Secondary | ICD-10-CM | POA: Diagnosis not present

## 2013-03-14 DIAGNOSIS — M999 Biomechanical lesion, unspecified: Secondary | ICD-10-CM | POA: Diagnosis not present

## 2013-03-14 DIAGNOSIS — M5430 Sciatica, unspecified side: Secondary | ICD-10-CM | POA: Diagnosis not present

## 2013-03-22 DIAGNOSIS — Z86010 Personal history of colonic polyps: Secondary | ICD-10-CM | POA: Diagnosis not present

## 2013-03-22 DIAGNOSIS — Z09 Encounter for follow-up examination after completed treatment for conditions other than malignant neoplasm: Secondary | ICD-10-CM | POA: Diagnosis not present

## 2013-03-22 DIAGNOSIS — K5730 Diverticulosis of large intestine without perforation or abscess without bleeding: Secondary | ICD-10-CM | POA: Diagnosis not present

## 2013-03-28 DIAGNOSIS — H409 Unspecified glaucoma: Secondary | ICD-10-CM | POA: Diagnosis not present

## 2013-03-28 DIAGNOSIS — K602 Anal fissure, unspecified: Secondary | ICD-10-CM | POA: Diagnosis not present

## 2013-03-28 DIAGNOSIS — E039 Hypothyroidism, unspecified: Secondary | ICD-10-CM | POA: Diagnosis not present

## 2013-03-28 DIAGNOSIS — M129 Arthropathy, unspecified: Secondary | ICD-10-CM | POA: Diagnosis not present

## 2013-03-28 DIAGNOSIS — E119 Type 2 diabetes mellitus without complications: Secondary | ICD-10-CM | POA: Diagnosis not present

## 2013-03-28 DIAGNOSIS — K625 Hemorrhage of anus and rectum: Secondary | ICD-10-CM | POA: Diagnosis not present

## 2013-03-28 DIAGNOSIS — K219 Gastro-esophageal reflux disease without esophagitis: Secondary | ICD-10-CM | POA: Diagnosis not present

## 2013-03-28 DIAGNOSIS — K6289 Other specified diseases of anus and rectum: Secondary | ICD-10-CM | POA: Diagnosis not present

## 2013-03-28 DIAGNOSIS — I10 Essential (primary) hypertension: Secondary | ICD-10-CM | POA: Diagnosis not present

## 2013-03-28 DIAGNOSIS — I2510 Atherosclerotic heart disease of native coronary artery without angina pectoris: Secondary | ICD-10-CM | POA: Diagnosis not present

## 2013-03-29 DIAGNOSIS — R7989 Other specified abnormal findings of blood chemistry: Secondary | ICD-10-CM | POA: Diagnosis not present

## 2013-03-29 DIAGNOSIS — M25569 Pain in unspecified knee: Secondary | ICD-10-CM | POA: Diagnosis not present

## 2013-03-29 DIAGNOSIS — M069 Rheumatoid arthritis, unspecified: Secondary | ICD-10-CM | POA: Diagnosis not present

## 2013-03-29 DIAGNOSIS — IMO0002 Reserved for concepts with insufficient information to code with codable children: Secondary | ICD-10-CM | POA: Diagnosis not present

## 2013-04-05 DIAGNOSIS — M069 Rheumatoid arthritis, unspecified: Secondary | ICD-10-CM | POA: Diagnosis not present

## 2013-04-06 DIAGNOSIS — H2510 Age-related nuclear cataract, unspecified eye: Secondary | ICD-10-CM | POA: Diagnosis not present

## 2013-04-06 DIAGNOSIS — H4011X0 Primary open-angle glaucoma, stage unspecified: Secondary | ICD-10-CM | POA: Diagnosis not present

## 2013-04-06 DIAGNOSIS — H409 Unspecified glaucoma: Secondary | ICD-10-CM | POA: Diagnosis not present

## 2013-04-11 DIAGNOSIS — M999 Biomechanical lesion, unspecified: Secondary | ICD-10-CM | POA: Diagnosis not present

## 2013-04-11 DIAGNOSIS — M5430 Sciatica, unspecified side: Secondary | ICD-10-CM | POA: Diagnosis not present

## 2013-04-15 DIAGNOSIS — H6120 Impacted cerumen, unspecified ear: Secondary | ICD-10-CM | POA: Diagnosis not present

## 2013-04-15 DIAGNOSIS — J342 Deviated nasal septum: Secondary | ICD-10-CM | POA: Diagnosis not present

## 2013-04-15 DIAGNOSIS — G4733 Obstructive sleep apnea (adult) (pediatric): Secondary | ICD-10-CM | POA: Diagnosis not present

## 2013-05-03 DIAGNOSIS — M069 Rheumatoid arthritis, unspecified: Secondary | ICD-10-CM | POA: Diagnosis not present

## 2013-05-18 DIAGNOSIS — Z9889 Other specified postprocedural states: Secondary | ICD-10-CM | POA: Diagnosis not present

## 2013-05-18 DIAGNOSIS — H905 Unspecified sensorineural hearing loss: Secondary | ICD-10-CM | POA: Diagnosis not present

## 2013-05-18 DIAGNOSIS — H902 Conductive hearing loss, unspecified: Secondary | ICD-10-CM | POA: Diagnosis not present

## 2013-05-18 DIAGNOSIS — H903 Sensorineural hearing loss, bilateral: Secondary | ICD-10-CM | POA: Diagnosis not present

## 2013-05-30 DIAGNOSIS — M999 Biomechanical lesion, unspecified: Secondary | ICD-10-CM | POA: Diagnosis not present

## 2013-05-30 DIAGNOSIS — M5430 Sciatica, unspecified side: Secondary | ICD-10-CM | POA: Diagnosis not present

## 2013-06-02 DIAGNOSIS — M069 Rheumatoid arthritis, unspecified: Secondary | ICD-10-CM | POA: Diagnosis not present

## 2013-06-06 DIAGNOSIS — H4011X0 Primary open-angle glaucoma, stage unspecified: Secondary | ICD-10-CM | POA: Diagnosis not present

## 2013-06-06 DIAGNOSIS — H409 Unspecified glaucoma: Secondary | ICD-10-CM | POA: Diagnosis not present

## 2013-06-06 DIAGNOSIS — H2510 Age-related nuclear cataract, unspecified eye: Secondary | ICD-10-CM | POA: Diagnosis not present

## 2013-06-06 DIAGNOSIS — H5010 Unspecified exotropia: Secondary | ICD-10-CM | POA: Diagnosis not present

## 2013-06-08 DIAGNOSIS — H409 Unspecified glaucoma: Secondary | ICD-10-CM | POA: Diagnosis not present

## 2013-06-08 DIAGNOSIS — H4011X0 Primary open-angle glaucoma, stage unspecified: Secondary | ICD-10-CM | POA: Diagnosis not present

## 2013-06-08 DIAGNOSIS — H2510 Age-related nuclear cataract, unspecified eye: Secondary | ICD-10-CM | POA: Diagnosis not present

## 2013-06-11 ENCOUNTER — Emergency Department: Payer: Self-pay | Admitting: Emergency Medicine

## 2013-06-11 DIAGNOSIS — Z79899 Other long term (current) drug therapy: Secondary | ICD-10-CM | POA: Diagnosis not present

## 2013-06-11 DIAGNOSIS — I10 Essential (primary) hypertension: Secondary | ICD-10-CM | POA: Diagnosis not present

## 2013-06-11 DIAGNOSIS — I2510 Atherosclerotic heart disease of native coronary artery without angina pectoris: Secondary | ICD-10-CM | POA: Diagnosis not present

## 2013-06-11 DIAGNOSIS — R0602 Shortness of breath: Secondary | ICD-10-CM | POA: Diagnosis not present

## 2013-06-11 DIAGNOSIS — R079 Chest pain, unspecified: Secondary | ICD-10-CM | POA: Diagnosis not present

## 2013-06-11 DIAGNOSIS — E119 Type 2 diabetes mellitus without complications: Secondary | ICD-10-CM | POA: Diagnosis not present

## 2013-06-11 LAB — BASIC METABOLIC PANEL
ANION GAP: 7 (ref 7–16)
BUN: 19 mg/dL — AB (ref 7–18)
CREATININE: 0.97 mg/dL (ref 0.60–1.30)
Calcium, Total: 9.8 mg/dL (ref 8.5–10.1)
Chloride: 104 mmol/L (ref 98–107)
Co2: 26 mmol/L (ref 21–32)
EGFR (African American): >60
EGFR (Non-African Amer.): >60
Glucose: 172 mg/dL — ABNORMAL HIGH (ref 65–99)
Osmolality: 280 (ref 275–301)
Potassium: 3.6 mmol/L (ref 3.5–5.1)
Sodium: 137 mmol/L (ref 136–145)

## 2013-06-11 LAB — CBC
HCT: 37 % — AB (ref 40.0–52.0)
HGB: 12.9 g/dL — ABNORMAL LOW (ref 13.0–18.0)
MCH: 31.9 pg (ref 26.0–34.0)
MCHC: 34.8 g/dL (ref 32.0–36.0)
MCV: 92 fL (ref 80–100)
Platelet: 217 10*3/uL (ref 150–440)
RBC: 4.03 10*6/uL — ABNORMAL LOW (ref 4.40–5.90)
RDW: 14.9 % — ABNORMAL HIGH (ref 11.5–14.5)
WBC: 5.4 10*3/uL (ref 3.8–10.6)

## 2013-06-11 LAB — LIPASE, BLOOD: LIPASE: 414 U/L — AB (ref 73–393)

## 2013-06-11 LAB — TROPONIN I
Troponin-I: <0.02 ng/mL
Troponin-I: <0.02 ng/mL

## 2013-06-14 DIAGNOSIS — Z23 Encounter for immunization: Secondary | ICD-10-CM | POA: Diagnosis not present

## 2013-06-14 DIAGNOSIS — E039 Hypothyroidism, unspecified: Secondary | ICD-10-CM | POA: Diagnosis not present

## 2013-06-14 DIAGNOSIS — E119 Type 2 diabetes mellitus without complications: Secondary | ICD-10-CM | POA: Diagnosis not present

## 2013-06-14 DIAGNOSIS — I2510 Atherosclerotic heart disease of native coronary artery without angina pectoris: Secondary | ICD-10-CM | POA: Diagnosis not present

## 2013-06-14 DIAGNOSIS — G4733 Obstructive sleep apnea (adult) (pediatric): Secondary | ICD-10-CM | POA: Diagnosis not present

## 2013-06-14 DIAGNOSIS — E782 Mixed hyperlipidemia: Secondary | ICD-10-CM | POA: Diagnosis not present

## 2013-06-14 DIAGNOSIS — Z79899 Other long term (current) drug therapy: Secondary | ICD-10-CM | POA: Diagnosis not present

## 2013-06-14 DIAGNOSIS — Z9181 History of falling: Secondary | ICD-10-CM | POA: Diagnosis not present

## 2013-06-23 DIAGNOSIS — M5430 Sciatica, unspecified side: Secondary | ICD-10-CM | POA: Diagnosis not present

## 2013-06-23 DIAGNOSIS — M999 Biomechanical lesion, unspecified: Secondary | ICD-10-CM | POA: Diagnosis not present

## 2013-06-30 DIAGNOSIS — M069 Rheumatoid arthritis, unspecified: Secondary | ICD-10-CM | POA: Diagnosis not present

## 2013-07-12 DIAGNOSIS — H2510 Age-related nuclear cataract, unspecified eye: Secondary | ICD-10-CM | POA: Diagnosis not present

## 2013-07-21 DIAGNOSIS — E119 Type 2 diabetes mellitus without complications: Secondary | ICD-10-CM | POA: Diagnosis not present

## 2013-07-21 DIAGNOSIS — G4733 Obstructive sleep apnea (adult) (pediatric): Secondary | ICD-10-CM | POA: Diagnosis not present

## 2013-07-21 DIAGNOSIS — I10 Essential (primary) hypertension: Secondary | ICD-10-CM | POA: Diagnosis not present

## 2013-07-21 DIAGNOSIS — Z87891 Personal history of nicotine dependence: Secondary | ICD-10-CM | POA: Diagnosis not present

## 2013-07-21 DIAGNOSIS — E039 Hypothyroidism, unspecified: Secondary | ICD-10-CM | POA: Diagnosis not present

## 2013-07-21 DIAGNOSIS — I2510 Atherosclerotic heart disease of native coronary artery without angina pectoris: Secondary | ICD-10-CM | POA: Diagnosis not present

## 2013-07-21 DIAGNOSIS — E785 Hyperlipidemia, unspecified: Secondary | ICD-10-CM | POA: Diagnosis not present

## 2013-07-21 DIAGNOSIS — H2510 Age-related nuclear cataract, unspecified eye: Secondary | ICD-10-CM | POA: Diagnosis not present

## 2013-07-21 DIAGNOSIS — Z7982 Long term (current) use of aspirin: Secondary | ICD-10-CM | POA: Diagnosis not present

## 2013-07-28 DIAGNOSIS — R7989 Other specified abnormal findings of blood chemistry: Secondary | ICD-10-CM | POA: Diagnosis not present

## 2013-07-28 DIAGNOSIS — IMO0002 Reserved for concepts with insufficient information to code with codable children: Secondary | ICD-10-CM | POA: Diagnosis not present

## 2013-07-28 DIAGNOSIS — M069 Rheumatoid arthritis, unspecified: Secondary | ICD-10-CM | POA: Diagnosis not present

## 2013-07-28 DIAGNOSIS — M79609 Pain in unspecified limb: Secondary | ICD-10-CM | POA: Diagnosis not present

## 2013-08-25 DIAGNOSIS — M069 Rheumatoid arthritis, unspecified: Secondary | ICD-10-CM | POA: Diagnosis not present

## 2013-08-29 DIAGNOSIS — H409 Unspecified glaucoma: Secondary | ICD-10-CM | POA: Diagnosis not present

## 2013-08-29 DIAGNOSIS — H4011X0 Primary open-angle glaucoma, stage unspecified: Secondary | ICD-10-CM | POA: Diagnosis not present

## 2013-09-15 DIAGNOSIS — E119 Type 2 diabetes mellitus without complications: Secondary | ICD-10-CM | POA: Diagnosis not present

## 2013-09-15 DIAGNOSIS — L03319 Cellulitis of trunk, unspecified: Secondary | ICD-10-CM | POA: Diagnosis not present

## 2013-09-15 DIAGNOSIS — L02219 Cutaneous abscess of trunk, unspecified: Secondary | ICD-10-CM | POA: Diagnosis not present

## 2013-09-15 DIAGNOSIS — R609 Edema, unspecified: Secondary | ICD-10-CM | POA: Diagnosis not present

## 2013-09-15 DIAGNOSIS — Z1331 Encounter for screening for depression: Secondary | ICD-10-CM | POA: Diagnosis not present

## 2013-10-20 DIAGNOSIS — E119 Type 2 diabetes mellitus without complications: Secondary | ICD-10-CM | POA: Diagnosis not present

## 2013-10-20 DIAGNOSIS — I2510 Atherosclerotic heart disease of native coronary artery without angina pectoris: Secondary | ICD-10-CM | POA: Diagnosis not present

## 2013-10-20 DIAGNOSIS — E782 Mixed hyperlipidemia: Secondary | ICD-10-CM | POA: Diagnosis not present

## 2013-10-20 DIAGNOSIS — E039 Hypothyroidism, unspecified: Secondary | ICD-10-CM | POA: Diagnosis not present

## 2013-10-25 DIAGNOSIS — H409 Unspecified glaucoma: Secondary | ICD-10-CM | POA: Diagnosis not present

## 2013-10-25 DIAGNOSIS — H4011X0 Primary open-angle glaucoma, stage unspecified: Secondary | ICD-10-CM | POA: Diagnosis not present

## 2013-10-25 DIAGNOSIS — H5010 Unspecified exotropia: Secondary | ICD-10-CM | POA: Diagnosis not present

## 2013-10-25 DIAGNOSIS — Z961 Presence of intraocular lens: Secondary | ICD-10-CM | POA: Diagnosis not present

## 2013-10-25 DIAGNOSIS — H2510 Age-related nuclear cataract, unspecified eye: Secondary | ICD-10-CM | POA: Diagnosis not present

## 2013-10-31 DIAGNOSIS — Z23 Encounter for immunization: Secondary | ICD-10-CM | POA: Diagnosis not present

## 2013-10-31 DIAGNOSIS — S61209A Unspecified open wound of unspecified finger without damage to nail, initial encounter: Secondary | ICD-10-CM | POA: Diagnosis not present

## 2013-11-24 DIAGNOSIS — H60399 Other infective otitis externa, unspecified ear: Secondary | ICD-10-CM | POA: Diagnosis not present

## 2013-11-24 DIAGNOSIS — K1370 Unspecified lesions of oral mucosa: Secondary | ICD-10-CM | POA: Diagnosis not present

## 2013-11-30 DIAGNOSIS — H4011X0 Primary open-angle glaucoma, stage unspecified: Secondary | ICD-10-CM | POA: Diagnosis not present

## 2013-11-30 DIAGNOSIS — H2510 Age-related nuclear cataract, unspecified eye: Secondary | ICD-10-CM | POA: Diagnosis not present

## 2013-11-30 DIAGNOSIS — H409 Unspecified glaucoma: Secondary | ICD-10-CM | POA: Diagnosis not present

## 2013-12-08 DIAGNOSIS — J342 Deviated nasal septum: Secondary | ICD-10-CM | POA: Diagnosis not present

## 2013-12-08 DIAGNOSIS — H9190 Unspecified hearing loss, unspecified ear: Secondary | ICD-10-CM | POA: Diagnosis not present

## 2013-12-08 DIAGNOSIS — K1370 Unspecified lesions of oral mucosa: Secondary | ICD-10-CM | POA: Diagnosis not present

## 2013-12-08 DIAGNOSIS — H6690 Otitis media, unspecified, unspecified ear: Secondary | ICD-10-CM | POA: Diagnosis not present

## 2013-12-19 DIAGNOSIS — M069 Rheumatoid arthritis, unspecified: Secondary | ICD-10-CM | POA: Diagnosis not present

## 2013-12-19 DIAGNOSIS — IMO0002 Reserved for concepts with insufficient information to code with codable children: Secondary | ICD-10-CM | POA: Diagnosis not present

## 2013-12-19 DIAGNOSIS — R7989 Other specified abnormal findings of blood chemistry: Secondary | ICD-10-CM | POA: Diagnosis not present

## 2013-12-19 DIAGNOSIS — K1370 Unspecified lesions of oral mucosa: Secondary | ICD-10-CM | POA: Diagnosis not present

## 2013-12-22 DIAGNOSIS — K1370 Unspecified lesions of oral mucosa: Secondary | ICD-10-CM | POA: Diagnosis not present

## 2013-12-22 DIAGNOSIS — H6690 Otitis media, unspecified, unspecified ear: Secondary | ICD-10-CM | POA: Diagnosis not present

## 2014-01-02 DIAGNOSIS — M129 Arthropathy, unspecified: Secondary | ICD-10-CM | POA: Diagnosis not present

## 2014-01-02 DIAGNOSIS — E039 Hypothyroidism, unspecified: Secondary | ICD-10-CM | POA: Diagnosis not present

## 2014-01-02 DIAGNOSIS — H5010 Unspecified exotropia: Secondary | ICD-10-CM | POA: Diagnosis not present

## 2014-01-02 DIAGNOSIS — I2510 Atherosclerotic heart disease of native coronary artery without angina pectoris: Secondary | ICD-10-CM | POA: Diagnosis not present

## 2014-01-02 DIAGNOSIS — I10 Essential (primary) hypertension: Secondary | ICD-10-CM | POA: Diagnosis not present

## 2014-01-02 DIAGNOSIS — E119 Type 2 diabetes mellitus without complications: Secondary | ICD-10-CM | POA: Diagnosis not present

## 2014-01-02 DIAGNOSIS — M069 Rheumatoid arthritis, unspecified: Secondary | ICD-10-CM | POA: Diagnosis not present

## 2014-01-02 DIAGNOSIS — G4733 Obstructive sleep apnea (adult) (pediatric): Secondary | ICD-10-CM | POA: Diagnosis not present

## 2014-01-02 DIAGNOSIS — E785 Hyperlipidemia, unspecified: Secondary | ICD-10-CM | POA: Diagnosis not present

## 2014-01-02 DIAGNOSIS — M109 Gout, unspecified: Secondary | ICD-10-CM | POA: Diagnosis not present

## 2014-01-20 DIAGNOSIS — I2510 Atherosclerotic heart disease of native coronary artery without angina pectoris: Secondary | ICD-10-CM | POA: Diagnosis not present

## 2014-01-20 DIAGNOSIS — M109 Gout, unspecified: Secondary | ICD-10-CM | POA: Diagnosis not present

## 2014-01-20 DIAGNOSIS — Z125 Encounter for screening for malignant neoplasm of prostate: Secondary | ICD-10-CM | POA: Diagnosis not present

## 2014-01-20 DIAGNOSIS — K219 Gastro-esophageal reflux disease without esophagitis: Secondary | ICD-10-CM | POA: Diagnosis not present

## 2014-01-20 DIAGNOSIS — G4733 Obstructive sleep apnea (adult) (pediatric): Secondary | ICD-10-CM | POA: Diagnosis not present

## 2014-01-20 DIAGNOSIS — Z79899 Other long term (current) drug therapy: Secondary | ICD-10-CM | POA: Diagnosis not present

## 2014-01-20 DIAGNOSIS — E119 Type 2 diabetes mellitus without complications: Secondary | ICD-10-CM | POA: Diagnosis not present

## 2014-01-20 DIAGNOSIS — E039 Hypothyroidism, unspecified: Secondary | ICD-10-CM | POA: Diagnosis not present

## 2014-02-02 DIAGNOSIS — S81809A Unspecified open wound, unspecified lower leg, initial encounter: Secondary | ICD-10-CM | POA: Diagnosis not present

## 2014-02-02 DIAGNOSIS — E538 Deficiency of other specified B group vitamins: Secondary | ICD-10-CM | POA: Diagnosis not present

## 2014-02-02 DIAGNOSIS — Z23 Encounter for immunization: Secondary | ICD-10-CM | POA: Diagnosis not present

## 2014-02-02 DIAGNOSIS — M546 Pain in thoracic spine: Secondary | ICD-10-CM | POA: Diagnosis not present

## 2014-02-02 DIAGNOSIS — S81009A Unspecified open wound, unspecified knee, initial encounter: Secondary | ICD-10-CM | POA: Diagnosis not present

## 2014-03-01 DIAGNOSIS — I8310 Varicose veins of unspecified lower extremity with inflammation: Secondary | ICD-10-CM | POA: Diagnosis not present

## 2014-03-15 DIAGNOSIS — R32 Unspecified urinary incontinence: Secondary | ICD-10-CM | POA: Diagnosis not present

## 2014-03-15 DIAGNOSIS — I8310 Varicose veins of unspecified lower extremity with inflammation: Secondary | ICD-10-CM | POA: Diagnosis not present

## 2014-03-21 DIAGNOSIS — R682 Dry mouth, unspecified: Secondary | ICD-10-CM | POA: Diagnosis not present

## 2014-03-21 DIAGNOSIS — I8311 Varicose veins of right lower extremity with inflammation: Secondary | ICD-10-CM | POA: Diagnosis not present

## 2014-03-21 DIAGNOSIS — M0609 Rheumatoid arthritis without rheumatoid factor, multiple sites: Secondary | ICD-10-CM | POA: Diagnosis not present

## 2014-03-24 DIAGNOSIS — D225 Melanocytic nevi of trunk: Secondary | ICD-10-CM | POA: Diagnosis not present

## 2014-03-24 DIAGNOSIS — I8310 Varicose veins of unspecified lower extremity with inflammation: Secondary | ICD-10-CM | POA: Diagnosis not present

## 2014-04-24 DIAGNOSIS — H6690 Otitis media, unspecified, unspecified ear: Secondary | ICD-10-CM | POA: Diagnosis not present

## 2014-04-24 DIAGNOSIS — H6090 Unspecified otitis externa, unspecified ear: Secondary | ICD-10-CM | POA: Diagnosis not present

## 2014-04-27 DIAGNOSIS — E039 Hypothyroidism, unspecified: Secondary | ICD-10-CM | POA: Diagnosis not present

## 2014-04-27 DIAGNOSIS — E782 Mixed hyperlipidemia: Secondary | ICD-10-CM | POA: Diagnosis not present

## 2014-04-27 DIAGNOSIS — I10 Essential (primary) hypertension: Secondary | ICD-10-CM | POA: Diagnosis not present

## 2014-04-27 DIAGNOSIS — Z1389 Encounter for screening for other disorder: Secondary | ICD-10-CM | POA: Diagnosis not present

## 2014-04-27 DIAGNOSIS — I2510 Atherosclerotic heart disease of native coronary artery without angina pectoris: Secondary | ICD-10-CM | POA: Diagnosis not present

## 2014-04-27 DIAGNOSIS — K219 Gastro-esophageal reflux disease without esophagitis: Secondary | ICD-10-CM | POA: Diagnosis not present

## 2014-04-27 DIAGNOSIS — E119 Type 2 diabetes mellitus without complications: Secondary | ICD-10-CM | POA: Diagnosis not present

## 2014-04-27 DIAGNOSIS — M109 Gout, unspecified: Secondary | ICD-10-CM | POA: Diagnosis not present

## 2014-05-02 DIAGNOSIS — I8310 Varicose veins of unspecified lower extremity with inflammation: Secondary | ICD-10-CM | POA: Diagnosis not present

## 2014-05-10 ENCOUNTER — Encounter (HOSPITAL_COMMUNITY): Payer: Self-pay

## 2014-05-10 ENCOUNTER — Emergency Department (HOSPITAL_COMMUNITY): Payer: Medicare Other

## 2014-05-10 ENCOUNTER — Inpatient Hospital Stay (HOSPITAL_COMMUNITY)
Admission: EM | Admit: 2014-05-10 | Discharge: 2014-05-15 | DRG: 291 | Disposition: A | Discharge status: Home / Self Care | Payer: Medicare Other | Attending: Internal Medicine | Admitting: Internal Medicine

## 2014-05-10 DIAGNOSIS — Z9049 Acquired absence of other specified parts of digestive tract: Secondary | ICD-10-CM | POA: Diagnosis present

## 2014-05-10 DIAGNOSIS — J9601 Acute respiratory failure with hypoxia: Secondary | ICD-10-CM | POA: Diagnosis present

## 2014-05-10 DIAGNOSIS — E669 Obesity, unspecified: Secondary | ICD-10-CM | POA: Diagnosis present

## 2014-05-10 DIAGNOSIS — M1990 Unspecified osteoarthritis, unspecified site: Secondary | ICD-10-CM | POA: Diagnosis present

## 2014-05-10 DIAGNOSIS — E118 Type 2 diabetes mellitus with unspecified complications: Secondary | ICD-10-CM | POA: Diagnosis not present

## 2014-05-10 DIAGNOSIS — R079 Chest pain, unspecified: Secondary | ICD-10-CM

## 2014-05-10 DIAGNOSIS — K219 Gastro-esophageal reflux disease without esophagitis: Secondary | ICD-10-CM | POA: Diagnosis present

## 2014-05-10 DIAGNOSIS — I517 Cardiomegaly: Secondary | ICD-10-CM | POA: Diagnosis not present

## 2014-05-10 DIAGNOSIS — I5033 Acute on chronic diastolic (congestive) heart failure: Secondary | ICD-10-CM | POA: Diagnosis not present

## 2014-05-10 DIAGNOSIS — I2510 Atherosclerotic heart disease of native coronary artery without angina pectoris: Secondary | ICD-10-CM | POA: Diagnosis present

## 2014-05-10 DIAGNOSIS — E119 Type 2 diabetes mellitus without complications: Secondary | ICD-10-CM

## 2014-05-10 DIAGNOSIS — Z6841 Body Mass Index (BMI) 40.0 and over, adult: Secondary | ICD-10-CM | POA: Diagnosis not present

## 2014-05-10 DIAGNOSIS — R0600 Dyspnea, unspecified: Secondary | ICD-10-CM | POA: Diagnosis present

## 2014-05-10 DIAGNOSIS — Z7982 Long term (current) use of aspirin: Secondary | ICD-10-CM

## 2014-05-10 DIAGNOSIS — R069 Unspecified abnormalities of breathing: Secondary | ICD-10-CM | POA: Diagnosis not present

## 2014-05-10 DIAGNOSIS — Z888 Allergy status to other drugs, medicaments and biological substances status: Secondary | ICD-10-CM

## 2014-05-10 DIAGNOSIS — E1140 Type 2 diabetes mellitus with diabetic neuropathy, unspecified: Secondary | ICD-10-CM | POA: Diagnosis present

## 2014-05-10 DIAGNOSIS — R9389 Abnormal findings on diagnostic imaging of other specified body structures: Secondary | ICD-10-CM | POA: Diagnosis present

## 2014-05-10 DIAGNOSIS — Z794 Long term (current) use of insulin: Secondary | ICD-10-CM | POA: Diagnosis not present

## 2014-05-10 DIAGNOSIS — R0602 Shortness of breath: Secondary | ICD-10-CM | POA: Diagnosis not present

## 2014-05-10 DIAGNOSIS — R938 Abnormal findings on diagnostic imaging of other specified body structures: Secondary | ICD-10-CM

## 2014-05-10 DIAGNOSIS — I10 Essential (primary) hypertension: Secondary | ICD-10-CM | POA: Diagnosis not present

## 2014-05-10 DIAGNOSIS — Z9989 Dependence on other enabling machines and devices: Secondary | ICD-10-CM

## 2014-05-10 DIAGNOSIS — F22 Delusional disorders: Secondary | ICD-10-CM

## 2014-05-10 DIAGNOSIS — M069 Rheumatoid arthritis, unspecified: Secondary | ICD-10-CM | POA: Diagnosis present

## 2014-05-10 DIAGNOSIS — J9600 Acute respiratory failure, unspecified whether with hypoxia or hypercapnia: Secondary | ICD-10-CM | POA: Diagnosis present

## 2014-05-10 DIAGNOSIS — G4733 Obstructive sleep apnea (adult) (pediatric): Secondary | ICD-10-CM | POA: Diagnosis present

## 2014-05-10 DIAGNOSIS — Z87891 Personal history of nicotine dependence: Secondary | ICD-10-CM | POA: Diagnosis not present

## 2014-05-10 DIAGNOSIS — R599 Enlarged lymph nodes, unspecified: Secondary | ICD-10-CM | POA: Diagnosis present

## 2014-05-10 DIAGNOSIS — E039 Hypothyroidism, unspecified: Secondary | ICD-10-CM | POA: Diagnosis present

## 2014-05-10 DIAGNOSIS — I519 Heart disease, unspecified: Secondary | ICD-10-CM | POA: Diagnosis not present

## 2014-05-10 DIAGNOSIS — R0789 Other chest pain: Secondary | ICD-10-CM | POA: Diagnosis not present

## 2014-05-10 HISTORY — DX: Headache, unspecified: R51.9

## 2014-05-10 HISTORY — DX: Headache: R51

## 2014-05-10 HISTORY — DX: Other chronic pain: G89.29

## 2014-05-10 HISTORY — DX: Low back pain: M54.5

## 2014-05-10 HISTORY — DX: Obstructive sleep apnea (adult) (pediatric): G47.33

## 2014-05-10 HISTORY — DX: Depression, unspecified: F32.A

## 2014-05-10 HISTORY — DX: Low back pain, unspecified: M54.50

## 2014-05-10 HISTORY — DX: Type 2 diabetes mellitus without complications: E11.9

## 2014-05-10 HISTORY — DX: Pure hypercholesterolemia, unspecified: E78.00

## 2014-05-10 HISTORY — DX: Major depressive disorder, single episode, unspecified: F32.9

## 2014-05-10 HISTORY — DX: Pneumonia, unspecified organism: J18.9

## 2014-05-10 HISTORY — DX: Wedge compression fracture of unspecified lumbar vertebra, initial encounter for closed fracture: S32.000A

## 2014-05-10 HISTORY — DX: Heart failure, unspecified: I50.9

## 2014-05-10 LAB — CBC
HCT: 33.4 % — ABNORMAL LOW (ref 39.0–52.0)
Hemoglobin: 11.6 g/dL — ABNORMAL LOW (ref 13.0–17.0)
MCH: 30.6 pg (ref 26.0–34.0)
MCHC: 34.7 g/dL (ref 30.0–36.0)
MCV: 88.1 fL (ref 78.0–100.0)
PLATELETS: 220 10*3/uL (ref 150–400)
RBC: 3.79 MIL/uL — AB (ref 4.22–5.81)
RDW: 13.6 % (ref 11.5–15.5)
WBC: 6.5 10*3/uL (ref 4.0–10.5)

## 2014-05-10 LAB — I-STAT ARTERIAL BLOOD GAS, ED
Acid-base deficit: 2 mmol/L (ref 0.0–2.0)
BICARBONATE: 21.9 meq/L (ref 20.0–24.0)
O2 Saturation: 91 %
PCO2 ART: 33.5 mmHg — AB (ref 35.0–45.0)
PH ART: 7.424 (ref 7.350–7.450)
TCO2: 23 mmol/L (ref 0–100)
pO2, Arterial: 60 mmHg — ABNORMAL LOW (ref 80.0–100.0)

## 2014-05-10 LAB — I-STAT TROPONIN, ED: Troponin i, poc: 0 ng/mL (ref 0.00–0.08)

## 2014-05-10 LAB — BASIC METABOLIC PANEL
ANION GAP: 11 (ref 5–15)
BUN: <5 mg/dL — ABNORMAL LOW (ref 6–23)
CALCIUM: 9.5 mg/dL (ref 8.4–10.5)
CO2: 22 mmol/L (ref 19–32)
CREATININE: 0.71 mg/dL (ref 0.50–1.35)
Chloride: 108 mEq/L (ref 96–112)
GFR calc Af Amer: >90 mL/min (ref >90)
Glucose, Bld: 118 mg/dL — ABNORMAL HIGH (ref 70–99)
Potassium: 3.2 mmol/L — ABNORMAL LOW (ref 3.5–5.1)
SODIUM: 141 mmol/L (ref 135–145)

## 2014-05-10 LAB — D-DIMER, QUANTITATIVE (NOT AT ARMC): D DIMER QUANT: 0.71 ug{FEU}/mL — AB (ref 0.00–0.48)

## 2014-05-10 LAB — BRAIN NATRIURETIC PEPTIDE: B Natriuretic Peptide: 15.2 pg/mL (ref 0.0–100.0)

## 2014-05-10 MED ORDER — IPRATROPIUM-ALBUTEROL 0.5-2.5 (3) MG/3ML IN SOLN
3.0000 mL | Freq: Four times a day (QID) | RESPIRATORY_TRACT | Status: DC
  Administered 2014-05-11 (×2): 3 mL via RESPIRATORY_TRACT
  Filled 2014-05-10 (×2): qty 3

## 2014-05-10 MED ORDER — ASPIRIN EC 81 MG PO TBEC: 81.0000 mg | DELAYED_RELEASE_TABLET | Freq: Every day | ORAL | Status: DC

## 2014-05-10 MED ORDER — INSULIN ASPART 100 UNIT/ML ~~LOC~~ SOLN
0.0000 [IU] | Freq: Every day | SUBCUTANEOUS | Status: DC
  Administered 2014-05-11: 2 [IU] via SUBCUTANEOUS

## 2014-05-10 MED ORDER — NITROGLYCERIN 0.4 MG SL SUBL
0.4000 mg | SUBLINGUAL_TABLET | SUBLINGUAL | Status: DC | PRN
  Administered 2014-05-10: 0.4 mg via SUBLINGUAL
  Filled 2014-05-10: qty 1

## 2014-05-10 MED ORDER — NITROGLYCERIN 0.4 MG SL SUBL: 0.4000 mg | SUBLINGUAL_TABLET | SUBLINGUAL | Status: DC | PRN

## 2014-05-10 MED ORDER — IPRATROPIUM-ALBUTEROL 0.5-2.5 (3) MG/3ML IN SOLN
3.0000 mL | Freq: Once | RESPIRATORY_TRACT | Status: AC
  Administered 2014-05-10: 3 mL via RESPIRATORY_TRACT
  Filled 2014-05-10: qty 3

## 2014-05-10 MED ORDER — ENOXAPARIN SODIUM 40 MG/0.4ML ~~LOC~~ SOLN
40.0000 mg | Freq: Every day | SUBCUTANEOUS | Status: DC
  Filled 2014-05-10: qty 0.4

## 2014-05-10 MED ORDER — ONDANSETRON HCL 4 MG PO TABS: 4.0000 mg | ORAL_TABLET | Freq: Four times a day (QID) | ORAL | Status: DC | PRN

## 2014-05-10 MED ORDER — ACETAMINOPHEN 650 MG RE SUPP: 650.0000 mg | Freq: Four times a day (QID) | RECTAL | Status: DC | PRN

## 2014-05-10 MED ORDER — IPRATROPIUM-ALBUTEROL 20-100 MCG/ACT IN AERS: 1.0000 | INHALATION_SPRAY | Freq: Four times a day (QID) | RESPIRATORY_TRACT | Status: DC

## 2014-05-10 MED ORDER — LOSARTAN POTASSIUM 25 MG PO TABS
25.0000 mg | ORAL_TABLET | Freq: Every day | ORAL | Status: DC
  Administered 2014-05-11 – 2014-05-15 (×5): 25 mg via ORAL
  Filled 2014-05-10 (×5): qty 1

## 2014-05-10 MED ORDER — SODIUM CHLORIDE 0.9 % IJ SOLN
3.0000 mL | Freq: Two times a day (BID) | INTRAMUSCULAR | Status: DC
  Administered 2014-05-11 – 2014-05-14 (×9): 3 mL via INTRAVENOUS

## 2014-05-10 MED ORDER — PANTOPRAZOLE SODIUM 40 MG PO TBEC
40.0000 mg | DELAYED_RELEASE_TABLET | Freq: Two times a day (BID) | ORAL | Status: DC
  Administered 2014-05-11 – 2014-05-14 (×9): 40 mg via ORAL
  Filled 2014-05-10 (×11): qty 1

## 2014-05-10 MED ORDER — FOLIC ACID 1 MG PO TABS
1.0000 mg | ORAL_TABLET | Freq: Three times a day (TID) | ORAL | Status: DC
  Administered 2014-05-11 – 2014-05-15 (×14): 1 mg via ORAL
  Filled 2014-05-10 (×16): qty 1

## 2014-05-10 MED ORDER — INSULIN ASPART 100 UNIT/ML ~~LOC~~ SOLN
0.0000 [IU] | Freq: Three times a day (TID) | SUBCUTANEOUS | Status: DC
  Administered 2014-05-11: 3 [IU] via SUBCUTANEOUS
  Administered 2014-05-11: 5 [IU] via SUBCUTANEOUS
  Administered 2014-05-12 (×2): 3 [IU] via SUBCUTANEOUS
  Administered 2014-05-13 – 2014-05-14 (×5): 2 [IU] via SUBCUTANEOUS
  Administered 2014-05-14 – 2014-05-15 (×2): 3 [IU] via SUBCUTANEOUS

## 2014-05-10 MED ORDER — ACETAMINOPHEN 325 MG PO TABS
650.0000 mg | ORAL_TABLET | Freq: Four times a day (QID) | ORAL | Status: DC | PRN
  Administered 2014-05-11 – 2014-05-14 (×7): 650 mg via ORAL
  Filled 2014-05-10 (×7): qty 2

## 2014-05-10 MED ORDER — ASPIRIN EC 81 MG PO TBEC
81.0000 mg | DELAYED_RELEASE_TABLET | Freq: Every day | ORAL | Status: DC
  Administered 2014-05-11 – 2014-05-15 (×5): 81 mg via ORAL
  Filled 2014-05-10 (×5): qty 1

## 2014-05-10 MED ORDER — HYDROCODONE-ACETAMINOPHEN 10-325 MG PO TABS: 1.0000 | ORAL_TABLET | Freq: Four times a day (QID) | ORAL | Status: DC | PRN

## 2014-05-10 MED ORDER — IOHEXOL 350 MG/ML SOLN
80.0000 mL | Freq: Once | INTRAVENOUS | Status: AC | PRN
  Administered 2014-05-10: 100 mL via INTRAVENOUS

## 2014-05-10 MED ORDER — FLUTICASONE PROPIONATE 50 MCG/ACT NA SUSP
2.0000 | Freq: Every day | NASAL | Status: DC
  Administered 2014-05-11 – 2014-05-15 (×6): 2 via NASAL
  Filled 2014-05-10: qty 16

## 2014-05-10 MED ORDER — ONDANSETRON HCL 4 MG/2ML IJ SOLN: 4.0000 mg | Freq: Four times a day (QID) | INTRAMUSCULAR | Status: DC | PRN

## 2014-05-10 MED ORDER — LEVOTHYROXINE SODIUM 100 MCG PO TABS
100.0000 ug | ORAL_TABLET | Freq: Every day | ORAL | Status: DC
  Administered 2014-05-11 – 2014-05-15 (×5): 100 ug via ORAL
  Filled 2014-05-10 (×6): qty 1

## 2014-05-10 NOTE — ED Provider Notes (Signed)
CSN: [id number]     Arrival date & time 05/10/14  1648 History   First MD Initiated Contact with Patient 05/10/14 1706     Chief Complaint  Patient presents with  . Chest Pain  . Shortness of Breath     Patient is a 68 y.o. male presenting with chest pain and shortness of breath. The history is provided by the patient. No language interpreter was used.  Chest Pain Associated symptoms: shortness of breath   Shortness of Breath Associated symptoms: chest pain    Joshua Lara presents for shortness of breath and chest pain. He reports about a week and a half of progressive shortness of breath and dyspnea on exertion. He has not productive cough. He denies any fevers. He is able to do minimal activities before he becomes significantly short of breath. On the way over here in the ambulance he developed central chest pressure that resolved with aspirin and nitroglycerin by EMS. He has a history of coronary artery disease but no stent placement, his last heart cath a few years ago showed a 70% blockage per patient. Symptoms are moderate, constant, worsening. He also endorses some lower extremities edema.   Past Medical History  Diagnosis Date  . Hypertension   . Diabetes mellitus   . Hypothyroid   . Coronary artery disease   . GERD (gastroesophageal reflux disease)   . Arthritis    Past Surgical History  Procedure Laterality Date  . Rotator cuff repair    . Appendectomy    . Cholecystectomy     History reviewed. No pertinent family history. History  Substance Use Topics  . Smoking status: Former Research scientist (life sciences)  . Smokeless tobacco: Not on file  . Alcohol Use: Yes     Comment: Occasional glass of whine    Review of Systems  Respiratory: Positive for shortness of breath.   Cardiovascular: Positive for chest pain.  All other systems reviewed and are negative.     Allergies  Etanercept  Home Medications   Prior to Admission medications   Medication Sig Start Date End Date Taking?  Authorizing Provider  albuterol (PROVENTIL HFA;VENTOLIN HFA) 108 (90 BASE) MCG/ACT inhaler Inhale 2 puffs into the lungs every 6 (six) hours as needed. For shortness of breath     Historical Provider, MD  aspirin EC 81 MG tablet Take 81 mg by mouth daily.      Historical Provider, MD  carvedilol (COREG) 6.25 MG tablet Take 6.25 mg by mouth 2 (two) times daily with a meal.      Historical Provider, MD  folic acid (FOLVITE) 1 MG tablet Take 1 mg by mouth daily.      Historical Provider, MD  glimepiride (AMARYL) 4 MG tablet Take 4 mg by mouth daily before breakfast.      Historical Provider, MD  hydrochlorothiazide (HYDRODIURIL) 25 MG tablet Take 25 mg by mouth daily.      Historical Provider, MD  HYDROcodone-acetaminophen (NORCO) 10-325 MG per tablet Take 1 tablet by mouth every 6 (six) hours as needed for pain. For pain 04/08/11   Barton Dubois, MD  levothyroxine (SYNTHROID, LEVOTHROID) 100 MCG tablet Take 100 mcg by mouth daily.      Historical Provider, MD  magnesium oxide (MAG-OX) 400 MG tablet Take 400 mg by mouth daily.      Historical Provider, MD  meclizine (ANTIVERT) 25 MG tablet Take 25 mg by mouth 3 (three) times daily as needed. For dizziness     Historical Provider, MD  methotrexate (RHEUMATREX) 2.5 MG tablet Take 15 mg by mouth once a week. Caution:Chemotherapy. Protect from light.     Historical Provider, MD  mometasone (NASONEX) 50 MCG/ACT nasal spray Place 2 sprays into the nose daily.      Historical Provider, MD  Multiple Vitamins-Minerals (MULTIVITAMINS THER. W/MINERALS) TABS Take 1 tablet by mouth daily.      Historical Provider, MD  NITROGLYCERIN RE Place 1 application rectally 3 (three) times daily.      Historical Provider, MD  omega-3 acid ethyl esters (LOVAZA) 1 G capsule Take by mouth 2 (two) times daily.      Historical Provider, MD  pregabalin (LYRICA) 50 MG capsule Take 50 mg by mouth 4 (four) times daily.      Historical Provider, MD  ranitidine (ZANTAC) 150 MG capsule  Take 150 mg by mouth every evening.      Historical Provider, MD  sitaGLIPtan-metformin (JANUMET) 50-1000 MG per tablet Take 1 tablet by mouth 2 (two) times daily with a meal.      Historical Provider, MD   BP 138/83 mmHg  Pulse 98  Temp(Src) 100 F (37.8 C) (Oral)  Resp 22  Ht 5\' 10"  (1.778 m)  Wt 282 lb (127.914 kg)  BMI 40.46 kg/m2  SpO2 99% Physical Exam  Constitutional: He is oriented to person, place, and time. He appears well-developed and well-nourished.  HENT:  Head: Normocephalic and atraumatic.  Cardiovascular: Normal rate and regular rhythm.   No murmur heard. Pulmonary/Chest: Effort normal. No respiratory distress.  Occasional rhonchi in bilateral bases, tachypneic  Abdominal: Soft. There is no tenderness. There is no rebound and no guarding.  Musculoskeletal: He exhibits no tenderness.  Trace pitting edema bilateral lower extremities  Neurological: He is alert and oriented to person, place, and time.  Skin: Skin is warm and dry.  Psychiatric: He has a normal mood and affect. His behavior is normal.  Nursing note and vitals reviewed.   ED Course  Procedures (including critical care time) Labs Review Labs Reviewed  CBC - Abnormal; Notable for the following:    RBC 3.79 (*)    Hemoglobin 11.6 (*)    HCT 33.4 (*)    All other components within normal limits  BASIC METABOLIC PANEL - Abnormal; Notable for the following:    Potassium 3.2 (*)    Glucose, Bld 118 (*)    BUN <5 (*)    All other components within normal limits  BRAIN NATRIURETIC PEPTIDE  I-STAT TROPOININ, ED    Imaging Review Dg Chest 2 View  05/10/2014   CLINICAL DATA:  Shortness of breath and mid chest pain. History of hypertension, CHF and diabetes.  EXAM: CHEST - 2 VIEW  COMPARISON:  06/11/2013  FINDINGS: The heart is mildly enlarged. Mild interstitial edema is suspected superimposed on chronic lung disease. There is no evidence of airspace consolidation, pneumothorax, nodule or pleural fluid.  The visualized skeletal structures are unremarkable.  IMPRESSION: Suspect mild interstitial edema.  The heart is mildly enlarged.   Electronically Signed   By: Aletta Edouard M.D.   On: 05/10/2014 17:29     EKG Interpretation   Date/Time:  Wednesday May 10 2014 16:55:35 EST Ventricular Rate:  96 PR Interval:  200 QRS Duration: 92 QT Interval:  417 QTC Calculation: 527 R Axis:   103 Text Interpretation:  Sinus rhythm Right axis deviation Low voltage,  precordial leads Borderline T abnormalities, anterior leads Prolonged QT  interval Confirmed by Hazle Coca [phone]) on 05/10/2014 5:16:22  PM      MDM   Final diagnoses:  Chest pain, unspecified chest pain type  Shortness of breath    Patient here for evaluation of progressive shortness of breath, developed chest pain today. In terms of shortness of breath, attempted albuterol nebulizer to assess for relief despite patient's lack of wheezes since patient does have a history of prolonged smoking, there is no significant relief with albuterol. Patient does develop hypoxia with ambulation. D-dimer was elevated, a CT scan without evidence of acute PE. Discussed with medicine regarding admission for chest pain rule out as well as shortness of breath with hypoxia. Patient caution level dropped to 89% with ambulation.    Quintella Reichert, MD 05/11/14 0005

## 2014-05-10 NOTE — ED Notes (Signed)
CT notified that the pt is ready for his scan.  

## 2014-05-10 NOTE — ED Notes (Signed)
Transporting patient to new room assignment. 

## 2014-05-10 NOTE — ED Notes (Addendum)
Ambulated pt from room to nurses station checking his SPO2. While in room, pt O2 was at 97% while walking the pt O2 dropped down to 93% Once we reached his room it had dropped down to 89%. Pt had sat on the side of the bed, his O2 picked back up to 93%. Put pt back on to nasal cannula at 4L at 97% Nurse was notified.

## 2014-05-10 NOTE — ED Notes (Addendum)
Per Posey Pronto, MD pt can eat now, but must be NPO after midnight. Pt given a [place] sandwich and a cup of water.

## 2014-05-10 NOTE — ED Notes (Signed)
Pt transported to xray 

## 2014-05-10 NOTE — ED Notes (Signed)
Pt transported from [facility] by [facility]. Pt was seen at [facility] prior to arrival for SOB. Pt was complaining of left arm pain and ems was called to transport pt from [facility] here. Pt began having chest pressure in route with ems and was given 324mg  ASA and 1 nitro and chest pressure resolved. EKG showed sinus tachycardian with an occasional pvc. Pt states he has been having sob on exertion for 2 weeks. Pt states that he has a 7% occlusion. Pt was struggling to breathe on scene and was was placed on 4L [place]. Pt on 96% RA after arrival.

## 2014-05-10 NOTE — H&P (Signed)
[facility] History and Physical  Patient: Joshua Lara  OIZ:[id number]  DOB: [birth year]  DOS: the patient was seen and examined on 05/10/2014 PCP: Leonides Sake, MD  Chief Complaint: Shortness of breath  HPI: TYCEN DOCKTER is a 68 y.o. male with Past medical history of hypertension, diabetes mellitus, hypothyroidism, coronary artery disease, GERD, rheumatoid arthritis on immunosuppression, sleep apnea on BiPAP. The patient presented with complaints of shortness of breath that has been ongoing for last 3-4 weeks and progressively worsening. Initially he was short of breath at more than usual exertion but now he is short of breath even at rest. Since last 3-4 days it has progressively worsened. He also has a cough without any expectoration. He denies any fever or chills denies any recent upper respiratory infection. He has grandchildren who were sick. He denies any nausea vomiting or choking episode. He has acid reflux and occasionally takes medications for that. He denies any diarrhea or constipation denies any burning urination denies any decrease in urination. He has on and off leg swelling. He has lost 10 pounds as he is trying to lose weight. He mentions he is compliant with BiPAP.  The patient is coming from home. And at his baseline independent for most of his ADL.  Review of Systems: as mentioned in the history of present illness.  A Comprehensive review of the other systems is negative.  Past Medical History  Diagnosis Date  . Hypertension   . Diabetes mellitus   . Hypothyroid   . Coronary artery disease   . GERD (gastroesophageal reflux disease)   . Arthritis    Past Surgical History  Procedure Laterality Date  . Rotator cuff repair    . Appendectomy    . Cholecystectomy     Social History:  reports that he has quit smoking. He does not have any smokeless tobacco history on file. He reports that he drinks alcohol. He reports that he does not use illicit  drugs.  Allergies  Allergen Reactions  . Etanercept Cough    History reviewed. No pertinent family history.  Prior to Admission medications   Medication Sig Start Date End Date Taking? Authorizing Provider  albuterol (PROVENTIL HFA;VENTOLIN HFA) 108 (90 BASE) MCG/ACT inhaler Inhale 2 puffs into the lungs every 6 (six) hours as needed. For shortness of breath    Yes Historical Provider, MD  aspirin EC 81 MG tablet Take 81 mg by mouth daily.     Yes Historical Provider, MD  folic acid (FOLVITE) 1 MG tablet Take 1 mg by mouth 3 (three) times daily. Take 3 every day per patient   Yes Historical Provider, MD  HYDROcodone-acetaminophen (NORCO) 10-325 MG per tablet Take 1 tablet by mouth every 6 (six) hours as needed for pain. For pain 04/08/11  Yes Barton Dubois, MD  levothyroxine (SYNTHROID, LEVOTHROID) 100 MCG tablet Take 100 mcg by mouth daily.     Yes Historical Provider, MD  losartan (COZAAR) 25 MG tablet Take 25 mg by mouth daily.   Yes Historical Provider, MD  magnesium oxide (MAG-OX) 400 MG tablet Take 400 mg by mouth daily.     Yes Historical Provider, MD  meclizine (ANTIVERT) 25 MG tablet Take 25 mg by mouth 3 (three) times daily as needed. For dizziness    Yes Historical Provider, MD  methotrexate (RHEUMATREX) 2.5 MG tablet Take 15 mg by mouth once a week. Caution:Chemotherapy. Protect from light. Take on mondays   Yes Historical Provider, MD  mometasone (NASONEX) 50 MCG/ACT  nasal spray Place 2 sprays into the nose daily.     Yes Historical Provider, MD  Multiple Vitamins-Minerals (MULTIVITAMINS THER. W/MINERALS) TABS Take 1 tablet by mouth daily.     Yes Historical Provider, MD  nitroGLYCERIN (NITROSTAT) 0.4 MG SL tablet Place 0.4 mg under the tongue every 5 (five) minutes as needed for chest pain.   Yes Historical Provider, MD  omega-3 acid ethyl esters (LOVAZA) 1 G capsule Take by mouth 2 (two) times daily.     Yes Historical Provider, MD  Probiotic Product (PROBIOTIC PO) Take 1  tablet by mouth daily.   Yes Historical Provider, MD  ranitidine (ZANTAC) 150 MG capsule Take 150 mg by mouth daily as needed for heartburn.    Yes Historical Provider, MD  sitaGLIPtan-metformin (JANUMET) 50-1000 MG per tablet Take 1 tablet by mouth 2 (two) times daily with a meal.     Yes Historical Provider, MD  carvedilol (COREG) 6.25 MG tablet Take 6.25 mg by mouth 2 (two) times daily with a meal.      Historical Provider, MD  glimepiride (AMARYL) 4 MG tablet Take 4 mg by mouth daily before breakfast.      Historical Provider, MD  hydrochlorothiazide (HYDRODIURIL) 25 MG tablet Take 25 mg by mouth daily.      Historical Provider, MD  NITROGLYCERIN RE Place 1 application rectally 3 (three) times daily.      Historical Provider, MD  pregabalin (LYRICA) 50 MG capsule Take 50 mg by mouth 4 (four) times daily.      Historical Provider, MD    Physical Exam: Filed Vitals:   05/10/14 2200 05/10/14 2215 05/10/14 2218 05/10/14 2225  BP: 105/64 116/66 113/64 115/56  Pulse: 103 101 101 106  Temp:      TempSrc:      Resp: 19 19 19 19   Height:      Weight:      SpO2: 95% 96% 96% 93%    General: Alert, Awake and Oriented to Time, Place and Person. Appear in mild distress Eyes: PERRL ENT: Oral Mucosa clear moist. Neck: Difficult to assess JVD Cardiovascular: S1 and S2 Present, no Murmur, Peripheral Pulses Present Respiratory: Bilateral Air entry equal and Decreased, bilateral basal Crackles, no wheezes Abdomen: Bowel Sound present, Soft and non tender Skin: no Rash Extremities: Bilateral Pedal edema, no calf tenderness Neurologic: Grossly no focal neuro deficit.  Labs on Admission:  CBC:  Recent Labs Lab 05/10/14 1705  WBC 6.5  HGB 11.6*  HCT 33.4*  MCV 88.1  PLT 220    CMP     Component Value Date/Time   NA 141 05/10/2014 1705   K 3.2* 05/10/2014 1705   CL 108 05/10/2014 1705   CO2 22 05/10/2014 1705   GLUCOSE 118* 05/10/2014 1705   BUN <5* 05/10/2014 1705   CREATININE  0.71 05/10/2014 1705   CALCIUM 9.5 05/10/2014 1705   PROT 6.9 12/24/2007 1410   ALBUMIN 4.2 12/24/2007 1410   AST 45* 12/24/2007 1410   ALT 61* 12/24/2007 1410   ALKPHOS 67 12/24/2007 1410   BILITOT 1.2 12/24/2007 1410   GFRNONAA >90 05/10/2014 1705   GFRAA >90 05/10/2014 1705    No results for input(s): LIPASE, AMYLASE in the last 168 hours. No results for input(s): AMMONIA in the last 168 hours.  No results for input(s): CKTOTAL, CKMB, CKMBINDEX, TROPONINI in the last 168 hours. BNP (last 3 results) No results for input(s): PROBNP in the last 8760 hours.  Radiological Exams on Admission:  Dg Chest 2 View  05/10/2014   CLINICAL DATA:  Shortness of breath and mid chest pain. History of hypertension, CHF and diabetes.  EXAM: CHEST - 2 VIEW  COMPARISON:  06/11/2013  FINDINGS: The heart is mildly enlarged. Mild interstitial edema is suspected superimposed on chronic lung disease. There is no evidence of airspace consolidation, pneumothorax, nodule or pleural fluid. The visualized skeletal structures are unremarkable.  IMPRESSION: Suspect mild interstitial edema.  The heart is mildly enlarged.   Electronically Signed   By: Aletta Edouard M.D.   On: 05/10/2014 17:29   Ct Angio Chest Pe W/cm &/or Wo Cm  05/10/2014   CLINICAL DATA:  Chest pain, shortness of breath  EXAM: CT ANGIOGRAPHY CHEST WITH CONTRAST  TECHNIQUE: Multidetector CT imaging of the chest was performed using the standard protocol during bolus administration of intravenous contrast. Multiplanar CT image reconstructions and MIPs were obtained to evaluate the vascular anatomy.  CONTRAST:  151mL OMNIPAQUE IOHEXOL 350 MG/ML SOLN  COMPARISON:  None.  FINDINGS: Knee peripheral subsegmental pulmonary arterial branches are suboptimally visualized secondary to respiratory motion. There is adequate opacification of the remainder of the pulmonary arteries. There is no pulmonary embolus. The main pulmonary artery, right main pulmonary artery  and left main pulmonary arteries are normal in size. The heart size is normal. There is no pericardial effusion. There is coronary artery atherosclerosis involving the LAD.  There is bibasilar atelectasis. There is no focal consolidation, pleural effusion or pneumothorax.  Mildly enlarged subcarinal lymph node measuring 16 mm in short axis. Mildly enlarged right lower paratracheal lymph node measuring 13 mm in short axis.  There is no lytic or blastic osseous lesion. There are old left rib fractures again noted. There are anterior bridging osteophytes of the thoracic spine as can be seen with diffuse idiopathic skeletal hyperostosis.  The visualized portions of the upper abdomen are unremarkable.  Review of the MIP images confirms the above findings.  IMPRESSION: 1. No evidence of pulmonary embolus. Peripheral subsegmental pulmonary arterial branches are suboptimally evaluated secondary to respiratory motion.   Electronically Signed   By: Kathreen Devoid   On: 05/10/2014 20:35    EKG: Independently reviewed. normal sinus rhythm, nonspecific ST and T waves changes.  Assessment/Plan Principal Problem:   Acute respiratory failure with hypoxemia Active Problems:   DM (diabetes mellitus)   HTN (hypertension)   Hypothyroidism   GERD (gastroesophageal reflux disease)   RA (rheumatoid arthritis)   Obesity   CAD in native artery   Dyspnea   OSA on CPAP   Abnormal CT scan, chest   1. Acute respiratory failure with hypoxemia  Possible acute on chronic diastolic heart failure Possible interstitial lung disease/interstitial pneumonitis  The patient is presenting with complaints of shortness of breath. He was found to have mildly hypoxia. His d-dimer was mildly elevated. His BNP and troponins are negative EKG does not show any evidence of ischemia. ABG shows a significant hypoxia. Currently the patient will be admitted in the hospital. I will give him 20 units of IV Lasix with possible suspicion for  acute on chronic diastolic heart failure. Possibility of pulmonary hypertension cannot be ruled out as well. He is a CT of the chest shows significant groundglass opacities with peripheral reticular pattern suggesting possibly interstitial lung disease/ interstitial pneumonitis Nebulizers as needed. Echocardiogram in the morning. Serial troponin. Check influenza PCR, sputum culture, urine antigens.  2. GERD. Placing the patient on scheduled PPI.  3. Diabetes mellitus. Placing the patient sliding scale.  4. Essential hypertension. Continue home medications. Negative BNP cannot be reliable in patient with obesity  5.  sleep apnea on BiPAP. Continuing BiPAP.  6. Rheumatoid arthritis. Possibility of interstitial pneumonitis/interstitial lung disease cannot be ruled out. Currently holding methotrexate/   Advance goals of care discussion: Full code   DVT Prophylaxis: subcutaneous Heparin Nutrition: Nothing by mouth after midnight  Family Communication: Family was present at bedside, opportunity was given to ask question and all questions were answered satisfactorily at the time of interview. Disposition: Admitted to inpatient in telemetry unit.  Author: Berle Mull, MD [facility] Pager: [phone] 05/10/2014, 11:03 PM    If 7PM-7AM, please contact night-coverage [email or web address] Password [email or web address]

## 2014-05-10 NOTE — Progress Notes (Signed)
Patient arrived to [place] at 2330 by stretcher from the emergency room on [facility].  Patient stated he had no pain but did appear to be short of breath during weighing as well as moving from stretcher to hospital bed.  Patient states he wants to sleep.  Flu pannel was swabbed and urine was obtained and sent to lab.  Will administer due medications and continue to monitor.

## 2014-05-10 NOTE — ED Notes (Signed)
Respiratory, Patel MD at bedside.

## 2014-05-11 ENCOUNTER — Encounter (HOSPITAL_COMMUNITY): Payer: Self-pay | Admitting: General Practice

## 2014-05-11 LAB — CBC WITH DIFFERENTIAL/PLATELET
BASOS PCT: 1 % (ref 0–1)
Basophils Absolute: 0.1 10*3/uL (ref 0.0–0.1)
Eosinophils Absolute: 0.7 10*3/uL (ref 0.0–0.7)
Eosinophils Relative: 10 % — ABNORMAL HIGH (ref 0–5)
HEMATOCRIT: 37.8 % — AB (ref 39.0–52.0)
Hemoglobin: 13.4 g/dL (ref 13.0–17.0)
LYMPHS ABS: 1.1 10*3/uL (ref 0.7–4.0)
LYMPHS PCT: 17 % (ref 12–46)
MCH: 31.6 pg (ref 26.0–34.0)
MCHC: 35.4 g/dL (ref 30.0–36.0)
MCV: 89.2 fL (ref 78.0–100.0)
MONO ABS: 0.7 10*3/uL (ref 0.1–1.0)
MONOS PCT: 11 % (ref 3–12)
NEUTROS ABS: 4.1 10*3/uL (ref 1.7–7.7)
Neutrophils Relative %: 61 % (ref 43–77)
Platelets: 273 10*3/uL (ref 150–400)
RBC: 4.24 MIL/uL (ref 4.22–5.81)
RDW: 13.9 % (ref 11.5–15.5)
WBC: 6.6 10*3/uL (ref 4.0–10.5)

## 2014-05-11 LAB — GLUCOSE, CAPILLARY
GLUCOSE-CAPILLARY: 154 mg/dL — AB (ref 70–99)
GLUCOSE-CAPILLARY: 225 mg/dL — AB (ref 70–99)
GLUCOSE-CAPILLARY: 229 mg/dL — AB (ref 70–99)
Glucose-Capillary: 170 mg/dL — ABNORMAL HIGH (ref 70–99)
Glucose-Capillary: 163 mg/dL — ABNORMAL HIGH (ref 70–99)

## 2014-05-11 LAB — COMPREHENSIVE METABOLIC PANEL
ALBUMIN: 3.8 g/dL (ref 3.5–5.2)
ALT: 36 U/L (ref 0–53)
AST: 37 U/L (ref 0–37)
Alkaline Phosphatase: 99 U/L (ref 39–117)
Anion gap: 10 (ref 5–15)
BILIRUBIN TOTAL: 1 mg/dL (ref 0.3–1.2)
BUN: <5 mg/dL — ABNORMAL LOW (ref 6–23)
CO2: 24 mmol/L (ref 19–32)
Calcium: 9.3 mg/dL (ref 8.4–10.5)
Chloride: 105 mEq/L (ref 96–112)
Creatinine, Ser: 0.79 mg/dL (ref 0.50–1.35)
Glucose, Bld: 153 mg/dL — ABNORMAL HIGH (ref 70–99)
POTASSIUM: 3.1 mmol/L — AB (ref 3.5–5.1)
Sodium: 139 mmol/L (ref 135–145)
Total Protein: 7.1 g/dL (ref 6.0–8.3)

## 2014-05-11 LAB — INFLUENZA PANEL BY PCR (TYPE A & B)
H1N1 flu by pcr: NOT DETECTED
INFLAPCR: NEGATIVE
Influenza B By PCR: NEGATIVE

## 2014-05-11 LAB — TSH: TSH: 1.309 u[IU]/mL (ref 0.350–4.500)

## 2014-05-11 LAB — PROTIME-INR
INR: 1.1 (ref 0.00–1.49)
INR: 1.02 (ref 0.00–1.49)
Prothrombin Time: 14.3 seconds (ref 11.6–15.2)
Prothrombin Time: 13.5 seconds (ref 11.6–15.2)

## 2014-05-11 LAB — TROPONIN I
Troponin I: <0.03 ng/mL (ref <0.031)
Troponin I: <0.03 ng/mL (ref <0.031)

## 2014-05-11 LAB — LEGIONELLA ANTIGEN, URINE

## 2014-05-11 LAB — BRAIN NATRIURETIC PEPTIDE: B Natriuretic Peptide: 11 pg/mL (ref 0.0–100.0)

## 2014-05-11 LAB — STREP PNEUMONIAE URINARY ANTIGEN: Strep Pneumo Urinary Antigen: NEGATIVE

## 2014-05-11 MED ORDER — IPRATROPIUM-ALBUTEROL 0.5-2.5 (3) MG/3ML IN SOLN
3.0000 mL | Freq: Four times a day (QID) | RESPIRATORY_TRACT | Status: DC | PRN
  Administered 2014-05-13: 3 mL via RESPIRATORY_TRACT
  Filled 2014-05-11: qty 3

## 2014-05-11 MED ORDER — LINAGLIPTIN 5 MG PO TABS
5.0000 mg | ORAL_TABLET | Freq: Two times a day (BID) | ORAL | Status: DC
  Administered 2014-05-11 – 2014-05-15 (×8): 5 mg via ORAL
  Filled 2014-05-11 (×11): qty 1

## 2014-05-11 MED ORDER — METFORMIN HCL 500 MG PO TABS
1000.0000 mg | ORAL_TABLET | Freq: Two times a day (BID) | ORAL | Status: DC
  Administered 2014-05-11 – 2014-05-15 (×8): 1000 mg via ORAL
  Filled 2014-05-11 (×10): qty 2

## 2014-05-11 MED ORDER — FUROSEMIDE 10 MG/ML IJ SOLN
40.0000 mg | Freq: Every day | INTRAMUSCULAR | Status: DC
  Administered 2014-05-11 – 2014-05-12 (×2): 40 mg via INTRAVENOUS
  Filled 2014-05-11 (×3): qty 4

## 2014-05-11 MED ORDER — ENOXAPARIN SODIUM 80 MG/0.8ML ~~LOC~~ SOLN
0.5000 mg/kg | Freq: Every day | SUBCUTANEOUS | Status: DC
  Administered 2014-05-11 – 2014-05-12 (×2): 65 mg via SUBCUTANEOUS
  Filled 2014-05-11 (×3): qty 0.8

## 2014-05-11 MED ORDER — SITAGLIPTIN PHOS-METFORMIN HCL 50-1000 MG PO TABS: 1.0000 | ORAL_TABLET | Freq: Two times a day (BID) | ORAL | Status: DC

## 2014-05-11 MED ORDER — PREGABALIN 25 MG PO CAPS: 50.0000 mg | ORAL_CAPSULE | Freq: Four times a day (QID) | ORAL | Status: DC

## 2014-05-11 MED ORDER — POTASSIUM CHLORIDE CRYS ER 20 MEQ PO TBCR: 40.0000 meq | EXTENDED_RELEASE_TABLET | ORAL | Status: DC | PRN

## 2014-05-11 MED ORDER — GLIMEPIRIDE 2 MG PO TABS
2.0000 mg | ORAL_TABLET | Freq: Every day | ORAL | Status: DC
  Filled 2014-05-11 (×2): qty 1

## 2014-05-11 MED ORDER — FUROSEMIDE 10 MG/ML IJ SOLN
20.0000 mg | Freq: Once | INTRAMUSCULAR | Status: AC
  Administered 2014-05-11: 20 mg via INTRAVENOUS
  Filled 2014-05-11: qty 2

## 2014-05-11 NOTE — Progress Notes (Signed)
SATURATION QUALIFICATIONS: (This note is used to comply with regulatory documentation for home oxygen)  Patient Saturations on Room Air at Rest = 94%  Patient Saturations on Room Air while Ambulating = 95%  Patient Saturations on 2 Liters of oxygen while Ambulating = 94%  Patient Saturations on 2 Liters of oxygen while at rest = 98%  Please briefly explain why patient needs home oxygen:  Pt does not qualify for Home oxygen.

## 2014-05-11 NOTE — Progress Notes (Addendum)
UR completed  Khalen Styer K. Venezia Sargeant, RN, BSN, [place], CCM  05/11/2014 12:54 PM

## 2014-05-11 NOTE — Care Management Note (Addendum)
  Page 1 of 1   05/15/2014     3:13:17 PM CARE MANAGEMENT NOTE 05/15/2014  Patient:  Joshua Lara, Joshua Lara   Account Number:  [id number]  Date Initiated:  05/11/2014  Documentation initiated by:  Mariann Laster  Subjective/Objective Assessment:   dyspnea     Action/Plan:   CM to follow for disposition needs   Anticipated DC Date:  05/14/2014   Anticipated DC Plan:  HOME/SELF CARE         Choice offered to / List presented to:             Status of service:  Completed, signed off Medicare Important Message given?  YES (If response is "NO", the following Medicare IM given date fields will be blank) Date Medicare IM given:  05/15/2014 Medicare IM given by:  Brandilee Pies Date Additional Medicare IM given:   Additional Medicare IM given by:    Discharge Disposition:  HOME/SELF CARE  Per UR Regulation:  Reviewed for med. necessity/level of care/duration of stay  If discussed at [place] of Stay Meetings, dates discussed:    Comments:  Pierson Vantol RN, BSN, MSHL, CCM  Nurse - Case Manager,  (Unit [place]) [phone]  05/15/2014 PT RECS:  None HOme / Self care.  Lora Glomski RN, BSN, MSHL, CCM  Nurse - Case Manager,  (Unit [phone]   05/11/2014 Social:  From home with wife Home DME:  CPap at hs PT RECS:  Pending

## 2014-05-11 NOTE — Progress Notes (Signed)
Pt states that he no longer takes glimiperide nor Lyrica, therefore, attending physician has discontinued both.

## 2014-05-11 NOTE — Progress Notes (Signed)
Influenza test results are negative, so pt has been removed from droplet precautions.

## 2014-05-11 NOTE — Progress Notes (Addendum)
Patient Demographics  Joshua Lara, is a 68 y.o. male, DOB - Apr 25, 1946, BLT:[id number]  Admit date - 05/10/2014   Admitting Physician Berle Mull, MD  Outpatient Primary MD for the patient is Leonides Sake, MD  LOS - 1   Chief Complaint  Patient presents with  . Chest Pain  . Shortness of Breath        Subjective:   Joshua Lara today has, No headache, No chest pain, No abdominal pain - No Nausea, No new weakness tingling or numbness, No Cough -  Improved SOB.   Assessment & Plan    1.  Acute respiratory failure with hypoxemia - due to acute on chronic nonspecific CHF. No previous echo in chart. Continue Lasix for diuresis, salt and fluid restriction, check echogram, monitor intake and output and daily weights. Supportive care as needed.   Filed Weights   05/10/14 1657 05/10/14 2339 05/11/14 0612  Weight: 127.914 kg (282 lb) 127.688 kg (281 lb 8 oz) 127.6 kg (281 lb 4.9 oz)       2. DM type II. Check A1c, he has stopped his Amaryl, continue Janumet along with sliding scale and monitor CBGs.  Lab Results  Component Value Date   HGBA1C 7.3* 04/05/2011    CBG (last 3)   Recent Labs  05/11/14 0029 05/11/14 0616  GLUCAP 225* 154*      3. Diabetic neuropathy. He is now off pregabalin.    4.HTN - On ARB stable.    5. Hypothyroidism. Check TSH. Continue Synthroid.    6. GERD. On PPI    7. OSA. On CPAP daily at bedtime.     8. Lymphadenopathy on chest CT. Follow with PCP and pulmonary outpatient.      Code Status Full  Family Communication: None  Disposition Plan: Home   Procedures   CTA Lungs   TTE   Consults     Medications  Scheduled Meds: . aspirin EC  81 mg Oral Daily  . enoxaparin (LOVENOX) injection  0.5 mg/kg Subcutaneous  Daily  . fluticasone  2 spray Each Nare Daily  . folic acid  1 mg Oral TID  . glimepiride  2 mg Oral QAC breakfast  . insulin aspart  0-15 Units Subcutaneous TID WC  . insulin aspart  0-5 Units Subcutaneous QHS  . ipratropium-albuterol  3 mL Nebulization Q6H  . levothyroxine  100 mcg Oral QAC breakfast  . losartan  25 mg Oral Daily  . pantoprazole  40 mg Oral BID AC  . pregabalin  50 mg Oral QID  . sitaGLIPtin-metformin  1 tablet Oral BID WC  . sodium chloride  3 mL Intravenous Q12H   Continuous Infusions:  PRN Meds:.acetaminophen **OR** [DISCONTINUED] acetaminophen, HYDROcodone-acetaminophen, nitroGLYCERIN, [DISCONTINUED] ondansetron **OR** ondansetron (ZOFRAN) IV  DVT Prophylaxis  Lovenox    Lab Results  Component Value Date   PLT 273 05/11/2014    Antibiotics   Anti-infectives    None          Objective:   Filed Vitals:   05/10/14 2339 05/11/14 0150 05/11/14 0202 05/11/14 0612  BP: 141/87   118/55  Pulse: 102  107 88  Temp: 98.5 F (36.9 C)   97.8 F (36.6 C)  TempSrc: Oral  Oral  Resp: 22  20 21   Height: 5\' 10"  (1.778 m)     Weight: 127.688 kg (281 lb 8 oz)   127.6 kg (281 lb 4.9 oz)  SpO2: 96% 99% 99% 100%    Wt Readings from Last 3 Encounters:  05/11/14 127.6 kg (281 lb 4.9 oz)  04/05/11 119.8 kg (264 lb 1.8 oz)     Intake/Output Summary (Last 24 hours) at 05/11/14 0855 Last data filed at 05/11/14 0528  Gross per 24 hour  Intake    140 ml  Output    625 ml  Net   -485 ml     Physical Exam  Awake Alert, Oriented X 3, No new F.N deficits, Normal affect Dean.AT,PERRAL Supple Neck,No JVD, No cervical lymphadenopathy appriciated.  Symmetrical Chest wall movement, Good air movement bilaterally, +ve rales RRR,No Gallops,Rubs or new Murmurs, No Parasternal Heave +ve B.Sounds, Abd Soft, No tenderness, No organomegaly appriciated, No rebound - guarding or rigidity. No Cyanosis, Clubbing , trace  edema, No new Rash or bruise      Data Review    Micro Results No results found for this or any previous visit (from the past 240 hour(s)).  Radiology Reports Dg Chest 2 View  05/10/2014   CLINICAL DATA:  Shortness of breath and mid chest pain. History of hypertension, CHF and diabetes.  EXAM: CHEST - 2 VIEW  COMPARISON:  06/11/2013  FINDINGS: The heart is mildly enlarged. Mild interstitial edema is suspected superimposed on chronic lung disease. There is no evidence of airspace consolidation, pneumothorax, nodule or pleural fluid. The visualized skeletal structures are unremarkable.  IMPRESSION: Suspect mild interstitial edema.  The heart is mildly enlarged.   Electronically Signed   By: Aletta Edouard M.D.   On: 05/10/2014 17:29   Ct Angio Chest Pe W/cm &/or Wo Cm  05/10/2014   CLINICAL DATA:  Chest pain, shortness of breath  EXAM: CT ANGIOGRAPHY CHEST WITH CONTRAST  TECHNIQUE: Multidetector CT imaging of the chest was performed using the standard protocol during bolus administration of intravenous contrast. Multiplanar CT image reconstructions and MIPs were obtained to evaluate the vascular anatomy.  CONTRAST:  139mL OMNIPAQUE IOHEXOL 350 MG/ML SOLN  COMPARISON:  None.  FINDINGS: Knee peripheral subsegmental pulmonary arterial branches are suboptimally visualized secondary to respiratory motion. There is adequate opacification of the remainder of the pulmonary arteries. There is no pulmonary embolus. The main pulmonary artery, right main pulmonary artery and left main pulmonary arteries are normal in size. The heart size is normal. There is no pericardial effusion. There is coronary artery atherosclerosis involving the LAD.  There is bibasilar atelectasis. There is no focal consolidation, pleural effusion or pneumothorax.  Mildly enlarged subcarinal lymph node measuring 16 mm in short axis. Mildly enlarged right lower paratracheal lymph node measuring 13 mm in short axis.  There is no lytic or blastic osseous lesion. There are old left rib  fractures again noted. There are anterior bridging osteophytes of the thoracic spine as can be seen with diffuse idiopathic skeletal hyperostosis.  The visualized portions of the upper abdomen are unremarkable.  Review of the MIP images confirms the above findings.  IMPRESSION: 1. No evidence of pulmonary embolus. Peripheral subsegmental pulmonary arterial branches are suboptimally evaluated secondary to respiratory motion.   Electronically Signed   By: Kathreen Devoid   On: 05/10/2014 20:35     CBC  Recent Labs Lab 05/10/14 1705 05/11/14 0634  WBC 6.5 6.6  HGB 11.6* 13.4  HCT 33.4* 37.8*  PLT 220 273  MCV 88.1 89.2  MCH 30.6 31.6  MCHC 34.7 35.4  RDW 13.6 13.9  LYMPHSABS  --  1.1  MONOABS  --  0.7  EOSABS  --  0.7  BASOSABS  --  0.1    Chemistries   Recent Labs Lab 05/10/14 1705 05/11/14 0634  NA 141 139  K 3.2* 3.1*  CL 108 105  CO2 22 24  GLUCOSE 118* 153*  BUN <5* PENDING  CREATININE 0.71 0.79  CALCIUM 9.5 9.3  AST  --  37  ALT  --  36  ALKPHOS  --  99  BILITOT  --  1.0   ------------------------------------------------------------------------------------------------------------------ estimated creatinine clearance is 120.1 mL/min (by C-G formula based on Cr of 0.79). ------------------------------------------------------------------------------------------------------------------ No results for input(s): HGBA1C in the last 72 hours. ------------------------------------------------------------------------------------------------------------------ No results for input(s): CHOL, HDL, LDLCALC, TRIG, CHOLHDL, LDLDIRECT in the last 72 hours. ------------------------------------------------------------------------------------------------------------------ No results for input(s): TSH, T4TOTAL, T3FREE, THYROIDAB in the last 72 hours.  Invalid input(s):  FREET3 ------------------------------------------------------------------------------------------------------------------ No results for input(s): VITAMINB12, FOLATE, FERRITIN, TIBC, IRON, RETICCTPCT in the last 72 hours.  Coagulation profile  Recent Labs Lab 05/11/14 0634  INR 1.10     Recent Labs  05/10/14 1800  DDIMER 0.71*    Cardiac Enzymes  Recent Labs Lab 05/10/14 2359 05/11/14 0634  TROPONINI <0.03 <0.03   ------------------------------------------------------------------------------------------------------------------ Invalid input(s): POCBNP     Time Spent in minutes   35   SINGH,PRASHANT K M.D on 05/11/2014 at 8:55 AM  Between 7am to 7pm - Pager - [phone]  After 7pm go to [email or web address] - [facility] Office  [phone]

## 2014-05-12 LAB — BASIC METABOLIC PANEL
Anion gap: 14 (ref 5–15)
BUN: 8 mg/dL (ref 6–23)
CALCIUM: 9.5 mg/dL (ref 8.4–10.5)
CHLORIDE: 102 meq/L (ref 96–112)
CO2: 22 mmol/L (ref 19–32)
Creatinine, Ser: 0.91 mg/dL (ref 0.50–1.35)
GFR calc non Af Amer: 86 mL/min — ABNORMAL LOW (ref >90)
Glucose, Bld: 187 mg/dL — ABNORMAL HIGH (ref 70–99)
Potassium: 3.4 mmol/L — ABNORMAL LOW (ref 3.5–5.1)
SODIUM: 138 mmol/L (ref 135–145)

## 2014-05-12 LAB — GLUCOSE, CAPILLARY
GLUCOSE-CAPILLARY: 167 mg/dL — AB (ref 70–99)
GLUCOSE-CAPILLARY: 125 mg/dL — AB (ref 70–99)
GLUCOSE-CAPILLARY: 125 mg/dL — AB (ref 70–99)
Glucose-Capillary: 184 mg/dL — ABNORMAL HIGH (ref 70–99)

## 2014-05-12 LAB — MAGNESIUM: Magnesium: 1.9 mg/dL (ref 1.5–2.5)

## 2014-05-12 MED ORDER — METOPROLOL TARTRATE 50 MG PO TABS
50.0000 mg | ORAL_TABLET | Freq: Two times a day (BID) | ORAL | Status: DC
  Administered 2014-05-12 – 2014-05-15 (×5): 50 mg via ORAL
  Filled 2014-05-12 (×8): qty 1

## 2014-05-12 MED ORDER — ENOXAPARIN SODIUM 60 MG/0.6ML ~~LOC~~ SOLN
60.0000 mg | Freq: Every day | SUBCUTANEOUS | Status: DC
  Administered 2014-05-13 – 2014-05-15 (×3): 60 mg via SUBCUTANEOUS
  Filled 2014-05-12 (×3): qty 0.6

## 2014-05-12 MED ORDER — POTASSIUM CHLORIDE CRYS ER 20 MEQ PO TBCR
40.0000 meq | EXTENDED_RELEASE_TABLET | Freq: Once | ORAL | Status: AC
  Administered 2014-05-12: 40 meq via ORAL
  Filled 2014-05-12: qty 2

## 2014-05-12 NOTE — Evaluation (Signed)
Physical Therapy Evaluation Patient Details Name: Joshua Lara MRN: [id number] DOB: March 11, 1946 Today's Date: 05/12/2014   History of Present Illness  pt presents with Respiratory Failure with CHF.    Clinical Impression  Pt mobility limited by DOE, though O2 sats remained in 90's throughout session.  Encouraged pt to continue ambulating on unit with Nsg S.  Feel pt will not need therapy at D/C, but will check on pt is remains on acute.      Follow Up Recommendations No PT follow up;Supervision - Intermittent    Equipment Recommendations  None recommended by PT    Recommendations for Other Services       Precautions / Restrictions Precautions Precautions: Fall Restrictions Weight Bearing Restrictions: No      Mobility  Bed Mobility               General bed mobility comments: pt sitting in recliner.    Transfers Overall transfer level: Modified independent Equipment used: None             General transfer comment: pt needs increased time, but able to complete without A.    Ambulation/Gait Ambulation/Gait assistance: Supervision Ambulation Distance (Feet): 160 Feet Assistive device: None Gait Pattern/deviations: Step-through pattern;Decreased stride length;Wide base of support     General Gait Details: pt maintains somewhat wide BOS and has a lateral sway with ambulation.  pt becomes SOB easily, however O2 sats remained in 90's throughout mobility.    Stairs            Wheelchair Mobility    Modified Rankin (Stroke Patients Only)       Balance Overall balance assessment: No apparent balance deficits (not formally assessed)                                           Pertinent Vitals/Pain Pain Assessment: No/denies pain    Home Living Family/patient expects to be discharged to:: Private residence Living Arrangements: Spouse/significant other;Children Available Help at Discharge: Family;Available 24 hours/day Type of  Home: House Home Access: Stairs to enter;Ramped entrance (Can use ramp if needed) Entrance Stairs-Rails: Left Entrance Stairs-Number of Steps: 6 Home Layout: One level Home Equipment: None      Prior Function Level of Independence: Needs assistance      ADL's / Homemaking Assistance Needed: Son and daughter-in-law A with homemaking and cooking.          Hand Dominance        Extremity/Trunk Assessment   Upper Extremity Assessment: Overall WFL for tasks assessed           Lower Extremity Assessment: Overall WFL for tasks assessed      Cervical / Trunk Assessment: Normal  Communication   Communication: No difficulties  Cognition Arousal/Alertness: Awake/alert Behavior During Therapy: WFL for tasks assessed/performed Overall Cognitive Status: Within Functional Limits for tasks assessed                      General Comments      Exercises        Assessment/Plan    PT Assessment Patient needs continued PT services  PT Diagnosis Difficulty walking   PT Problem List Decreased activity tolerance;Decreased mobility;Cardiopulmonary status limiting activity  PT Treatment Interventions Gait training;Stair training;Functional mobility training;Therapeutic activities;Therapeutic exercise;Balance training;Patient/family education   PT Goals (Current goals can be found in the Care Plan  section) Acute Rehab PT Goals Patient Stated Goal: Breathe normally.   PT Goal Formulation: With patient Time For Goal Achievement: 05/19/14 Potential to Achieve Goals: Good    Frequency Min 3X/week   Barriers to discharge        Co-evaluation               End of Session   Activity Tolerance: Patient limited by fatigue Patient left: in chair;with call bell/phone within reach Nurse Communication: Mobility status         Time: 1052-1110 PT Time Calculation (min) (ACUTE ONLY): 18 min   Charges:   PT Evaluation $Initial PT Evaluation Tier I: 1  Procedure PT Treatments $Gait Training: 8-22 mins   PT G CodesCatarina Hartshorn, [place] 05/12/2014, 1:28 PM

## 2014-05-12 NOTE — Progress Notes (Signed)
Pt c/o a lump on neck, the size of a quarter.  Pt says it is tender.  Lump is hard and not movable.  MD notified.  Will continue to monitor.

## 2014-05-12 NOTE — Progress Notes (Signed)
Patient Demographics  Joshua Lara, is a 69 y.o. male, DOB - September 04, 1945, LFY:[id number]  Admit date - 05/10/2014   Admitting Physician Berle Mull, MD  Outpatient Primary MD for the patient is Leonides Sake, MD  LOS - 2   Chief Complaint  Patient presents with  . Chest Pain  . Shortness of Breath        Subjective:   Joshua Lara today has, No headache, No chest pain, No abdominal pain - No Nausea, No new weakness tingling or numbness, No Cough -  Improved SOB.   Assessment & Plan    1.  Acute respiratory failure with hypoxemia - due to acute on chronic nonspecific CHF. No previous echo in chart. Continue Lasix for diuresis, salt and fluid restriction, pending echogram, monitor intake and output and daily weights. Supportive care as needed.   Filed Weights   05/10/14 2339 05/11/14 0612 05/12/14 0631  Weight: 127.688 kg (281 lb 8 oz) 127.6 kg (281 lb 4.9 oz) 126.9 kg (279 lb 12.2 oz)       2. DM type II. Check A1c, he has stopped his Amaryl, continue Janumet along with sliding scale and monitor CBGs.  Lab Results  Component Value Date   HGBA1C 7.3* 04/05/2011    CBG (last 3)   Recent Labs  05/11/14 1638 05/11/14 2119 05/12/14 0612  GLUCAP 170* 163* 184*      3. Diabetic neuropathy. He is now off pregabalin.    4.HTN - On ARB added Lopressor for better control.    5. Hypothyroidism. Stable TSH. Continue Synthroid.    6. GERD. On PPI    7. OSA. On CPAP daily at bedtime.     8. Lymphadenopathy on chest CT. Follow with PCP and pulmonary outpatient.      Code Status Full  Family Communication: None  Disposition Plan: Home   Procedures   CTA Lungs   TTE   Consults     Medications  Scheduled Meds: . aspirin EC  81 mg Oral Daily  .  enoxaparin (LOVENOX) injection  0.5 mg/kg Subcutaneous Daily  . fluticasone  2 spray Each Nare Daily  . folic acid  1 mg Oral TID  . furosemide  40 mg Intravenous Daily  . insulin aspart  0-15 Units Subcutaneous TID WC  . insulin aspart  0-5 Units Subcutaneous QHS  . levothyroxine  100 mcg Oral QAC breakfast  . linagliptin  5 mg Oral BID WC   And  . metFORMIN  1,000 mg Oral BID WC  . losartan  25 mg Oral Daily  . metoprolol tartrate  50 mg Oral BID  . pantoprazole  40 mg Oral BID AC  . potassium chloride  40 mEq Oral Once  . sodium chloride  3 mL Intravenous Q12H   Continuous Infusions:  PRN Meds:.acetaminophen **OR** [DISCONTINUED] acetaminophen, HYDROcodone-acetaminophen, ipratropium-albuterol, nitroGLYCERIN, [DISCONTINUED] ondansetron **OR** ondansetron (ZOFRAN) IV  DVT Prophylaxis  Lovenox    Lab Results  Component Value Date   PLT 273 05/11/2014    Antibiotics   Anti-infectives    None          Objective:   Filed Vitals:   05/11/14 1935 05/12/14 0207 05/12/14 0631 05/12/14 0900  BP: 121/71 100/56 108/69 113/78  Pulse: 107 99 104 112  Temp: 98.2 F (36.8 C) 97.9 F (36.6 C) 98 F (36.7 C) 97.3 F (36.3 C)  TempSrc: Oral Oral Oral Oral  Resp: 20 20 20 20   Height:      Weight:   126.9 kg (279 lb 12.2 oz)   SpO2: 97% 94% 97% 99%    Wt Readings from Last 3 Encounters:  05/12/14 126.9 kg (279 lb 12.2 oz)  04/05/11 119.8 kg (264 lb 1.8 oz)     Intake/Output Summary (Last 24 hours) at 05/12/14 1022 Last data filed at 05/12/14 0900  Gross per 24 hour  Intake    942 ml  Output   1826 ml  Net   -884 ml     Physical Exam  Awake Alert, Oriented X 3, No new F.N deficits, Normal affect [place].AT,PERRAL Supple Neck,No JVD, No cervical lymphadenopathy appriciated.  Symmetrical Chest wall movement, Good air movement bilaterally, +ve rales RRR,No Gallops,Rubs or new Murmurs, No Parasternal Heave +ve B.Sounds, Abd Soft, No tenderness, No organomegaly  appriciated, No rebound - guarding or rigidity. No Cyanosis, Clubbing , trace  edema, No new Rash or bruise      Data Review   Micro Results No results found for this or any previous visit (from the past 240 hour(s)).  Radiology Reports Dg Chest 2 View  05/10/2014   CLINICAL DATA:  Shortness of breath and mid chest pain. History of hypertension, CHF and diabetes.  EXAM: CHEST - 2 VIEW  COMPARISON:  06/11/2013  FINDINGS: The heart is mildly enlarged. Mild interstitial edema is suspected superimposed on chronic lung disease. There is no evidence of airspace consolidation, pneumothorax, nodule or pleural fluid. The visualized skeletal structures are unremarkable.  IMPRESSION: Suspect mild interstitial edema.  The heart is mildly enlarged.   Electronically Signed   By: Aletta Edouard M.D.   On: 05/10/2014 17:29   Ct Angio Chest Pe W/cm &/or Wo Cm  05/10/2014   CLINICAL DATA:  Chest pain, shortness of breath  EXAM: CT ANGIOGRAPHY CHEST WITH CONTRAST  TECHNIQUE: Multidetector CT imaging of the chest was performed using the standard protocol during bolus administration of intravenous contrast. Multiplanar CT image reconstructions and MIPs were obtained to evaluate the vascular anatomy.  CONTRAST:  124mL OMNIPAQUE IOHEXOL 350 MG/ML SOLN  COMPARISON:  None.  FINDINGS: Knee peripheral subsegmental pulmonary arterial branches are suboptimally visualized secondary to respiratory motion. There is adequate opacification of the remainder of the pulmonary arteries. There is no pulmonary embolus. The main pulmonary artery, right main pulmonary artery and left main pulmonary arteries are normal in size. The heart size is normal. There is no pericardial effusion. There is coronary artery atherosclerosis involving the LAD.  There is bibasilar atelectasis. There is no focal consolidation, pleural effusion or pneumothorax.  Mildly enlarged subcarinal lymph node measuring 16 mm in short axis. Mildly enlarged right lower  paratracheal lymph node measuring 13 mm in short axis.  There is no lytic or blastic osseous lesion. There are old left rib fractures again noted. There are anterior bridging osteophytes of the thoracic spine as can be seen with diffuse idiopathic skeletal hyperostosis.  The visualized portions of the upper abdomen are unremarkable.  Review of the MIP images confirms the above findings.  IMPRESSION: 1. No evidence of pulmonary embolus. Peripheral subsegmental pulmonary arterial branches are suboptimally evaluated secondary to respiratory motion.   Electronically Signed   By: Kathreen Devoid   On: 05/10/2014 20:35     CBC  Recent Labs Lab 05/10/14 1705 05/11/14 0634  WBC 6.5 6.6  HGB 11.6* 13.4  HCT 33.4* 37.8*  PLT 220 273  MCV 88.1 89.2  MCH 30.6 31.6  MCHC 34.7 35.4  RDW 13.6 13.9  LYMPHSABS  --  1.1  MONOABS  --  0.7  EOSABS  --  0.7  BASOSABS  --  0.1    Chemistries   Recent Labs Lab 05/10/14 1705 05/11/14 0634 05/12/14 0608  NA 141 139 138  K 3.2* 3.1* 3.4*  CL 108 105 102  CO2 22 24 22   GLUCOSE 118* 153* 187*  BUN <5* <5* 8  CREATININE 0.71 0.79 0.91  CALCIUM 9.5 9.3 9.5  MG  --   --  1.9  AST  --  37  --   ALT  --  36  --   ALKPHOS  --  99  --   BILITOT  --  1.0  --    ------------------------------------------------------------------------------------------------------------------ estimated creatinine clearance is 105.4 mL/min (by C-G formula based on Cr of 0.91). ------------------------------------------------------------------------------------------------------------------ No results for input(s): HGBA1C in the last 72 hours. ------------------------------------------------------------------------------------------------------------------ No results for input(s): CHOL, HDL, LDLCALC, TRIG, CHOLHDL, LDLDIRECT in the last 72 hours. ------------------------------------------------------------------------------------------------------------------  Recent Labs   05/11/14 1140  TSH 1.309   ------------------------------------------------------------------------------------------------------------------ No results for input(s): VITAMINB12, FOLATE, FERRITIN, TIBC, IRON, RETICCTPCT in the last 72 hours.  Coagulation profile  Recent Labs Lab 05/11/14 0634 05/11/14 1140  INR 1.10 1.02     Recent Labs  05/10/14 1800  DDIMER 0.71*    Cardiac Enzymes  Recent Labs Lab 05/10/14 2359 05/11/14 0634 05/11/14 1140  TROPONINI <0.03 <0.03 <0.03   ------------------------------------------------------------------------------------------------------------------ Invalid input(s): POCBNP     Time Spent in minutes   35   Joshua Lara K M.D on 05/12/2014 at 10:22 AM  Between 7am to 7pm - Pager - [phone]  After 7pm go to [email or web address] - [facility] Office  [phone]

## 2014-05-12 NOTE — Progress Notes (Signed)
Pt is able to place on CPAP when ready. Encouraged pt to call RT if needing any assistance.

## 2014-05-13 LAB — BASIC METABOLIC PANEL
Anion gap: 12 (ref 5–15)
BUN: 11 mg/dL (ref 6–23)
CALCIUM: 9.8 mg/dL (ref 8.4–10.5)
CHLORIDE: 102 meq/L (ref 96–112)
CO2: 24 mmol/L (ref 19–32)
CREATININE: 0.9 mg/dL (ref 0.50–1.35)
GFR calc Af Amer: >90 mL/min (ref >90)
GFR calc non Af Amer: 86 mL/min — ABNORMAL LOW (ref >90)
Glucose, Bld: 149 mg/dL — ABNORMAL HIGH (ref 70–99)
Potassium: 3.4 mmol/L — ABNORMAL LOW (ref 3.5–5.1)
Sodium: 138 mmol/L (ref 135–145)

## 2014-05-13 LAB — GLUCOSE, CAPILLARY
GLUCOSE-CAPILLARY: 145 mg/dL — AB (ref 70–99)
GLUCOSE-CAPILLARY: 132 mg/dL — AB (ref 70–99)
GLUCOSE-CAPILLARY: 139 mg/dL — AB (ref 70–99)
Glucose-Capillary: 140 mg/dL — ABNORMAL HIGH (ref 70–99)

## 2014-05-13 LAB — POTASSIUM: Potassium: 3.8 mmol/L (ref 3.5–5.1)

## 2014-05-13 LAB — MAGNESIUM: Magnesium: 1.7 mg/dL (ref 1.5–2.5)

## 2014-05-13 MED ORDER — POLYETHYLENE GLYCOL 3350 17 G PO PACK
17.0000 g | PACK | Freq: Two times a day (BID) | ORAL | Status: DC
  Administered 2014-05-13 – 2014-05-15 (×3): 17 g via ORAL
  Filled 2014-05-13 (×6): qty 1

## 2014-05-13 MED ORDER — POTASSIUM CHLORIDE CRYS ER 20 MEQ PO TBCR
40.0000 meq | EXTENDED_RELEASE_TABLET | Freq: Once | ORAL | Status: DC
  Filled 2014-05-13: qty 2

## 2014-05-13 MED ORDER — DOCUSATE SODIUM 100 MG PO CAPS
200.0000 mg | ORAL_CAPSULE | Freq: Two times a day (BID) | ORAL | Status: DC
  Administered 2014-05-13 – 2014-05-15 (×3): 200 mg via ORAL
  Filled 2014-05-13 (×6): qty 2

## 2014-05-13 MED ORDER — POTASSIUM CHLORIDE CRYS ER 20 MEQ PO TBCR
40.0000 meq | EXTENDED_RELEASE_TABLET | ORAL | Status: AC
  Administered 2014-05-13 (×2): 40 meq via ORAL
  Filled 2014-05-13 (×2): qty 2

## 2014-05-13 MED ORDER — FUROSEMIDE 10 MG/ML IJ SOLN
40.0000 mg | Freq: Two times a day (BID) | INTRAMUSCULAR | Status: DC
  Administered 2014-05-13 – 2014-05-14 (×3): 40 mg via INTRAVENOUS
  Filled 2014-05-13 (×5): qty 4

## 2014-05-13 NOTE — Progress Notes (Signed)
Patient places himself on CPAP at night. NO issues and is able to turn machine on and off. Water added to humidifier and patient will call if any help needed

## 2014-05-13 NOTE — Progress Notes (Signed)
Dr. Candiss Norse notified that patient requesting PRN for constipation.  Will await further orders and continue to monitor.

## 2014-05-13 NOTE — Progress Notes (Signed)
Patient Demographics  Joshua Lara, is a 69 y.o. male, DOB - 03-Jul-1945, WJX:[id number]  Admit date - 05/10/2014   Admitting Physician Berle Mull, MD  Outpatient Primary MD for the patient is Leonides Sake, MD  LOS - 3   Chief Complaint  Patient presents with  . Chest Pain  . Shortness of Breath        Subjective:   Joshua Lara today has, No headache, No chest pain, No abdominal pain - No Nausea, No new weakness tingling or numbness, No Cough -  Improved SOB.   Assessment & Plan    1.  Acute respiratory failure with hypoxemia - due to acute on chronic nonspecific CHF. No previous echo in chart. Continue Lasix for diuresis, salt and fluid restriction, pending echogram, monitor intake and output and daily weights. Supportive care as needed. Has improved. Weight likely not reliable. His net 2 L negative.   Filed Weights   05/11/14 0612 05/12/14 0631 05/13/14 0632  Weight: 127.6 kg (281 lb 4.9 oz) 126.9 kg (279 lb 12.2 oz) 127.506 kg (281 lb 1.6 oz)       2. DM type II. Check A1c, he has stopped his Amaryl, continue Janumet along with sliding scale and monitor CBGs.  Lab Results  Component Value Date   HGBA1C 7.3* 04/05/2011    CBG (last 3)   Recent Labs  05/12/14 1633 05/12/14 2146 05/13/14 0550  GLUCAP 125* 125* 145*      3. Diabetic neuropathy. He is now off pregabalin.    4.HTN - On ARB added Lopressor for better control.    5. Hypothyroidism. Stable TSH. Continue Synthroid.    6. GERD. On PPI    7. OSA. On CPAP daily at bedtime.     8. Lymphadenopathy on chest CT. Follow with PCP and pulmonary outpatient.      Code Status Full  Family Communication: None  Disposition Plan: Home   Procedures   CTA Lungs no PE.   TTE   Consults      Medications  Scheduled Meds: . aspirin EC  81 mg Oral Daily  . docusate sodium  200 mg Oral BID  . enoxaparin (LOVENOX) injection  60 mg Subcutaneous Daily  . fluticasone  2 spray Each Nare Daily  . folic acid  1 mg Oral TID  . furosemide  40 mg Intravenous BID  . insulin aspart  0-15 Units Subcutaneous TID WC  . insulin aspart  0-5 Units Subcutaneous QHS  . levothyroxine  100 mcg Oral QAC breakfast  . linagliptin  5 mg Oral BID WC   And  . metFORMIN  1,000 mg Oral BID WC  . losartan  25 mg Oral Daily  . metoprolol tartrate  50 mg Oral BID  . pantoprazole  40 mg Oral BID AC  . polyethylene glycol  17 g Oral BID  . potassium chloride  40 mEq Oral Q4H  . [START ON 05/14/2014] potassium chloride  40 mEq Oral Once  . sodium chloride  3 mL Intravenous Q12H   Continuous Infusions:  PRN Meds:.acetaminophen **OR** [DISCONTINUED] acetaminophen, HYDROcodone-acetaminophen, ipratropium-albuterol, nitroGLYCERIN, [DISCONTINUED] ondansetron **OR** ondansetron (ZOFRAN) IV  DVT Prophylaxis  Lovenox    Lab Results  Component Value Date   PLT  273 05/11/2014    Antibiotics   Anti-infectives    None          Objective:   Filed Vitals:   05/12/14 2200 05/12/14 2302 05/13/14 0632 05/13/14 0930  BP: 122/72 119/68 106/47 108/64  Pulse: 86 85 87 79  Temp: 98.8 F (37.1 C)  98.1 F (36.7 C) 98.2 F (36.8 C)  TempSrc: Oral  Oral Oral  Resp: 20  20 20   Height:      Weight:   127.506 kg (281 lb 1.6 oz)   SpO2: 96%  94% 93%    Wt Readings from Last 3 Encounters:  05/13/14 127.506 kg (281 lb 1.6 oz)  04/05/11 119.8 kg (264 lb 1.8 oz)     Intake/Output Summary (Last 24 hours) at 05/13/14 1008 Last data filed at 05/13/14 0810  Gross per 24 hour  Intake   1440 ml  Output   2450 ml  Net  -1010 ml     Physical Exam  Awake Alert, Oriented X 3, No new F.N deficits, Normal affect [place].AT,PERRAL Supple Neck,No JVD, No cervical lymphadenopathy appriciated.  Symmetrical Chest  wall movement, Good air movement bilaterally, +ve rales RRR,No Gallops,Rubs or new Murmurs, No Parasternal Heave +ve B.Sounds, Abd Soft, No tenderness, No organomegaly appriciated, No rebound - guarding or rigidity. No Cyanosis, Clubbing , trace  edema, No new Rash or bruise      Data Review   Micro Results No results found for this or any previous visit (from the past 240 hour(s)).  Radiology Reports Dg Chest 2 View  05/10/2014   CLINICAL DATA:  Shortness of breath and mid chest pain. History of hypertension, CHF and diabetes.  EXAM: CHEST - 2 VIEW  COMPARISON:  06/11/2013  FINDINGS: The heart is mildly enlarged. Mild interstitial edema is suspected superimposed on chronic lung disease. There is no evidence of airspace consolidation, pneumothorax, nodule or pleural fluid. The visualized skeletal structures are unremarkable.  IMPRESSION: Suspect mild interstitial edema.  The heart is mildly enlarged.   Electronically Signed   By: Aletta Edouard M.D.   On: 05/10/2014 17:29   Ct Angio Chest Pe W/cm &/or Wo Cm  05/10/2014   CLINICAL DATA:  Chest pain, shortness of breath  EXAM: CT ANGIOGRAPHY CHEST WITH CONTRAST  TECHNIQUE: Multidetector CT imaging of the chest was performed using the standard protocol during bolus administration of intravenous contrast. Multiplanar CT image reconstructions and MIPs were obtained to evaluate the vascular anatomy.  CONTRAST:  161mL OMNIPAQUE IOHEXOL 350 MG/ML SOLN  COMPARISON:  None.  FINDINGS: Knee peripheral subsegmental pulmonary arterial branches are suboptimally visualized secondary to respiratory motion. There is adequate opacification of the remainder of the pulmonary arteries. There is no pulmonary embolus. The main pulmonary artery, right main pulmonary artery and left main pulmonary arteries are normal in size. The heart size is normal. There is no pericardial effusion. There is coronary artery atherosclerosis involving the LAD.  There is bibasilar  atelectasis. There is no focal consolidation, pleural effusion or pneumothorax.  Mildly enlarged subcarinal lymph node measuring 16 mm in short axis. Mildly enlarged right lower paratracheal lymph node measuring 13 mm in short axis.  There is no lytic or blastic osseous lesion. There are old left rib fractures again noted. There are anterior bridging osteophytes of the thoracic spine as can be seen with diffuse idiopathic skeletal hyperostosis.  The visualized portions of the upper abdomen are unremarkable.  Review of the MIP images confirms the above findings.  IMPRESSION:  1. No evidence of pulmonary embolus. Peripheral subsegmental pulmonary arterial branches are suboptimally evaluated secondary to respiratory motion.   Electronically Signed   By: Kathreen Devoid   On: 05/10/2014 20:35     CBC  Recent Labs Lab 05/10/14 1705 05/11/14 0634  WBC 6.5 6.6  HGB 11.6* 13.4  HCT 33.4* 37.8*  PLT 220 273  MCV 88.1 89.2  MCH 30.6 31.6  MCHC 34.7 35.4  RDW 13.6 13.9  LYMPHSABS  --  1.1  MONOABS  --  0.7  EOSABS  --  0.7  BASOSABS  --  0.1    Chemistries   Recent Labs Lab 05/10/14 1705 05/11/14 0634 05/12/14 0608 05/13/14 0253  NA 141 139 138 138  K 3.2* 3.1* 3.4* 3.4*  CL 108 105 102 102  CO2 22 24 22 24   GLUCOSE 118* 153* 187* 149*  BUN <5* <5* 8 11  CREATININE 0.71 0.79 0.91 0.90  CALCIUM 9.5 9.3 9.5 9.8  MG  --   --  1.9  --   AST  --  37  --   --   ALT  --  36  --   --   ALKPHOS  --  99  --   --   BILITOT  --  1.0  --   --    ------------------------------------------------------------------------------------------------------------------ estimated creatinine clearance is 106.8 mL/min (by C-G formula based on Cr of 0.9). ------------------------------------------------------------------------------------------------------------------ No results for input(s): HGBA1C in the last 72  hours. ------------------------------------------------------------------------------------------------------------------ No results for input(s): CHOL, HDL, LDLCALC, TRIG, CHOLHDL, LDLDIRECT in the last 72 hours. ------------------------------------------------------------------------------------------------------------------  Recent Labs  05/11/14 1140  TSH 1.309   ------------------------------------------------------------------------------------------------------------------ No results for input(s): VITAMINB12, FOLATE, FERRITIN, TIBC, IRON, RETICCTPCT in the last 72 hours.  Coagulation profile  Recent Labs Lab 05/11/14 0634 05/11/14 1140  INR 1.10 1.02     Recent Labs  05/10/14 1800  DDIMER 0.71*    Cardiac Enzymes  Recent Labs Lab 05/10/14 2359 05/11/14 0634 05/11/14 1140  TROPONINI <0.03 <0.03 <0.03   ------------------------------------------------------------------------------------------------------------------ Invalid input(s): POCBNP     Time Spent in minutes   35   Ramiya Delahunty K M.D on 05/13/2014 at 10:08 AM  Between 7am to 7pm - Pager - [phone]  After 7pm go to [email or web address] - [facility] Office  [phone]

## 2014-05-13 NOTE — Progress Notes (Signed)
Pt. Places himself on & off BIPAP & doesn't need assistance. Pt. Stated that he would place himself on BIPAP tonight before going to bed. Water was added to humidifier per pt. Request.

## 2014-05-14 DIAGNOSIS — I10 Essential (primary) hypertension: Secondary | ICD-10-CM

## 2014-05-14 LAB — GLUCOSE, CAPILLARY
GLUCOSE-CAPILLARY: 116 mg/dL — AB (ref 70–99)
Glucose-Capillary: 139 mg/dL — ABNORMAL HIGH (ref 70–99)
Glucose-Capillary: 173 mg/dL — ABNORMAL HIGH (ref 70–99)
Glucose-Capillary: 125 mg/dL — ABNORMAL HIGH (ref 70–99)

## 2014-05-14 LAB — TROPONIN I: Troponin I: <0.03 ng/mL (ref <0.031)

## 2014-05-14 LAB — HEMOGLOBIN A1C
Hgb A1c MFr Bld: 6.2 % — ABNORMAL HIGH (ref <5.7)
Mean Plasma Glucose: 131 mg/dL — ABNORMAL HIGH (ref <117)

## 2014-05-14 MED ORDER — MAGNESIUM SULFATE IN D5W 10-5 MG/ML-% IV SOLN
1.0000 g | Freq: Once | INTRAVENOUS | Status: AC
  Administered 2014-05-14: 1 g via INTRAVENOUS
  Filled 2014-05-14: qty 100

## 2014-05-14 MED ORDER — POTASSIUM CHLORIDE CRYS ER 20 MEQ PO TBCR
40.0000 meq | EXTENDED_RELEASE_TABLET | Freq: Once | ORAL | Status: AC
  Administered 2014-05-14: 40 meq via ORAL

## 2014-05-14 MED ORDER — POTASSIUM CHLORIDE CRYS ER 20 MEQ PO TBCR
40.0000 meq | EXTENDED_RELEASE_TABLET | Freq: Every day | ORAL | Status: DC
  Administered 2014-05-15: 40 meq via ORAL
  Filled 2014-05-14: qty 2

## 2014-05-14 NOTE — Progress Notes (Signed)
Pt states that he is able to place self on BIPAP. He does not need assistance. He will call RT/RN if he needs help.

## 2014-05-14 NOTE — Progress Notes (Signed)
Pt had a run of nonsustained V-tach of 5 beats, no s/s, MD notified, MD ordered K level and Mg level, will continue to monitor, thanks Arvella Nigh RN

## 2014-05-14 NOTE — Progress Notes (Addendum)
Patient Demographics  Joshua Lara, is a 69 y.o. male, DOB - 1945/11/28, RWE:[id number]  Admit date - 05/10/2014   Admitting Physician Berle Mull, MD  Outpatient Primary MD for the patient is Leonides Sake, MD  LOS - 4   Chief Complaint  Patient presents with  . Chest Pain  . Shortness of Breath        Subjective:   Rodrigus Kilker today has, No headache, No chest pain, No abdominal pain - No Nausea, No new weakness tingling or numbness, No Cough -  Improved SOB.   Assessment & Plan    1.  Acute respiratory failure with hypoxemia - due to acute on chronic nonspecific CHF. No previous echo in chart. Continue IV Lasix for diuresis, salt and fluid restriction, pending echogram, monitor intake and output and daily weights. Supportive care as needed. Has improved. Weight likely not reliable. His net 2.5 L negative. And has certainly improved in terms of shortness of breath since admission.   Filed Weights   05/12/14 0631 05/13/14 0632 05/14/14 0534  Weight: 126.9 kg (279 lb 12.2 oz) 127.506 kg (281 lb 1.6 oz) 126.2 kg (278 lb 3.5 oz)       2. DM type II. Check A1c, he has stopped his Amaryl, continue Janumet along with sliding scale and monitor CBGs.  Lab Results  Component Value Date   HGBA1C 7.3* 04/05/2011    CBG (last 3)   Recent Labs  05/13/14 1622 05/13/14 2053 05/14/14 0604  GLUCAP 140* 139* 139*      3. Diabetic neuropathy. He is now off pregabalin.    4.HTN - On ARB added Lopressor for better control.    5. Hypothyroidism. Stable TSH. Continue Synthroid.    6. GERD. On PPI    7. OSA. On CPAP daily at bedtime.     8. Lymphadenopathy on chest CT. Follow with PCP and pulmonary outpatient.    9. 5 beat run of asymptomatic V. tach early in the  morning on 05/14/2014. Magnesium borderline replaced, potassium stable, monitor BMP, on beta blocker continue, echo pending to evaluate EF. Cycle troponins.      Code Status Full  Family Communication: None  Disposition Plan: Home   Procedures    CTA Lungs no PE.   TTE pending   Consults     Medications  Scheduled Meds: . aspirin EC  81 mg Oral Daily  . docusate sodium  200 mg Oral BID  . enoxaparin (LOVENOX) injection  60 mg Subcutaneous Daily  . fluticasone  2 spray Each Nare Daily  . folic acid  1 mg Oral TID  . furosemide  40 mg Intravenous BID  . insulin aspart  0-15 Units Subcutaneous TID WC  . insulin aspart  0-5 Units Subcutaneous QHS  . levothyroxine  100 mcg Oral QAC breakfast  . linagliptin  5 mg Oral BID WC   And  . metFORMIN  1,000 mg Oral BID WC  . losartan  25 mg Oral Daily  . metoprolol tartrate  50 mg Oral BID  . pantoprazole  40 mg Oral BID AC  . polyethylene glycol  17 g Oral BID  . sodium chloride  3 mL Intravenous Q12H   Continuous Infusions:  PRN Meds:.acetaminophen **  OR** [DISCONTINUED] acetaminophen, HYDROcodone-acetaminophen, ipratropium-albuterol, nitroGLYCERIN, [DISCONTINUED] ondansetron **OR** ondansetron (ZOFRAN) IV  DVT Prophylaxis  Lovenox    Lab Results  Component Value Date   PLT 273 05/11/2014    Antibiotics   Anti-infectives    None          Objective:   Filed Vitals:   05/13/14 1959 05/13/14 2251 05/14/14 0534 05/14/14 0900  BP: 100/70 100/60 120/64 113/64  Pulse: 111 112 101 101  Temp: 97.4 F (36.3 C)  97.4 F (36.3 C) 98.2 F (36.8 C)  TempSrc: Oral  Oral Oral  Resp: 20  20 20   Height:      Weight:   126.2 kg (278 lb 3.5 oz)   SpO2: 96%  93% 98%    Wt Readings from Last 3 Encounters:  05/14/14 126.2 kg (278 lb 3.5 oz)  04/05/11 119.8 kg (264 lb 1.8 oz)     Intake/Output Summary (Last 24 hours) at 05/14/14 0954 Last data filed at 05/14/14 0900  Gross per 24 hour  Intake   1043 ml  Output    2125 ml  Net  -1082 ml     Physical Exam  Awake Alert, Oriented X 3, No new F.N deficits, Normal affect [place].AT,PERRAL Supple Neck,No JVD, No cervical lymphadenopathy appriciated.  Symmetrical Chest wall movement, Good air movement bilaterally, +ve rales RRR,No Gallops,Rubs or new Murmurs, No Parasternal Heave +ve B.Sounds, Abd Soft, No tenderness, No organomegaly appriciated, No rebound - guarding or rigidity. No Cyanosis, Clubbing , trace  edema, No new Rash or bruise      Data Review   Micro Results No results found for this or any previous visit (from the past 240 hour(s)).  Radiology Reports Dg Chest 2 View  05/10/2014   CLINICAL DATA:  Shortness of breath and mid chest pain. History of hypertension, CHF and diabetes.  EXAM: CHEST - 2 VIEW  COMPARISON:  06/11/2013  FINDINGS: The heart is mildly enlarged. Mild interstitial edema is suspected superimposed on chronic lung disease. There is no evidence of airspace consolidation, pneumothorax, nodule or pleural fluid. The visualized skeletal structures are unremarkable.  IMPRESSION: Suspect mild interstitial edema.  The heart is mildly enlarged.   Electronically Signed   By: Aletta Edouard M.D.   On: 05/10/2014 17:29   Ct Angio Chest Pe W/cm &/or Wo Cm  05/10/2014   CLINICAL DATA:  Chest pain, shortness of breath  EXAM: CT ANGIOGRAPHY CHEST WITH CONTRAST  TECHNIQUE: Multidetector CT imaging of the chest was performed using the standard protocol during bolus administration of intravenous contrast. Multiplanar CT image reconstructions and MIPs were obtained to evaluate the vascular anatomy.  CONTRAST:  142mL OMNIPAQUE IOHEXOL 350 MG/ML SOLN  COMPARISON:  None.  FINDINGS: Knee peripheral subsegmental pulmonary arterial branches are suboptimally visualized secondary to respiratory motion. There is adequate opacification of the remainder of the pulmonary arteries. There is no pulmonary embolus. The main pulmonary artery, right main pulmonary  artery and left main pulmonary arteries are normal in size. The heart size is normal. There is no pericardial effusion. There is coronary artery atherosclerosis involving the LAD.  There is bibasilar atelectasis. There is no focal consolidation, pleural effusion or pneumothorax.  Mildly enlarged subcarinal lymph node measuring 16 mm in short axis. Mildly enlarged right lower paratracheal lymph node measuring 13 mm in short axis.  There is no lytic or blastic osseous lesion. There are old left rib fractures again noted. There are anterior bridging osteophytes of the thoracic spine as can  be seen with diffuse idiopathic skeletal hyperostosis.  The visualized portions of the upper abdomen are unremarkable.  Review of the MIP images confirms the above findings.  IMPRESSION: 1. No evidence of pulmonary embolus. Peripheral subsegmental pulmonary arterial branches are suboptimally evaluated secondary to respiratory motion.   Electronically Signed   By: Kathreen Devoid   On: 05/10/2014 20:35     CBC  Recent Labs Lab 05/10/14 1705 05/11/14 0634  WBC 6.5 6.6  HGB 11.6* 13.4  HCT 33.4* 37.8*  PLT 220 273  MCV 88.1 89.2  MCH 30.6 31.6  MCHC 34.7 35.4  RDW 13.6 13.9  LYMPHSABS  --  1.1  MONOABS  --  0.7  EOSABS  --  0.7  BASOSABS  --  0.1    Chemistries   Recent Labs Lab 05/10/14 1705 05/11/14 0634 05/12/14 0608 05/13/14 0253 05/13/14 2052  NA 141 139 138 138  --   K 3.2* 3.1* 3.4* 3.4* 3.8  CL 108 105 102 102  --   CO2 22 24 22 24   --   GLUCOSE 118* 153* 187* 149*  --   BUN <5* <5* 8 11  --   CREATININE 0.71 0.79 0.91 0.90  --   CALCIUM 9.5 9.3 9.5 9.8  --   MG  --   --  1.9  --  1.7  AST  --  37  --   --   --   ALT  --  36  --   --   --   ALKPHOS  --  99  --   --   --   BILITOT  --  1.0  --   --   --    ------------------------------------------------------------------------------------------------------------------ estimated creatinine clearance is 106.2 mL/min (by C-G formula based  on Cr of 0.9). ------------------------------------------------------------------------------------------------------------------ No results for input(s): HGBA1C in the last 72 hours. ------------------------------------------------------------------------------------------------------------------ No results for input(s): CHOL, HDL, LDLCALC, TRIG, CHOLHDL, LDLDIRECT in the last 72 hours. ------------------------------------------------------------------------------------------------------------------  Recent Labs  05/11/14 1140  TSH 1.309   ------------------------------------------------------------------------------------------------------------------ No results for input(s): VITAMINB12, FOLATE, FERRITIN, TIBC, IRON, RETICCTPCT in the last 72 hours.  Coagulation profile  Recent Labs Lab 05/11/14 0634 05/11/14 1140  INR 1.10 1.02    No results for input(s): DDIMER in the last 72 hours.  Cardiac Enzymes  Recent Labs Lab 05/11/14 0634 05/11/14 1140 05/14/14 0719  TROPONINI <0.03 <0.03 <0.03   ------------------------------------------------------------------------------------------------------------------ Invalid input(s): POCBNP     Time Spent in minutes   35   Benay Pomeroy K M.D on 05/14/2014 at 9:54 AM  Between 7am to 7pm - Pager - [phone]  After 7pm go to [email or web address] - [facility] Office  [phone]

## 2014-05-14 NOTE — Progress Notes (Signed)
  Echocardiogram 2D Echocardiogram has been performed.  Joshua Lara 05/14/2014, 8:54 AM

## 2014-05-15 DIAGNOSIS — I5033 Acute on chronic diastolic (congestive) heart failure: Principal | ICD-10-CM

## 2014-05-15 LAB — BASIC METABOLIC PANEL
ANION GAP: 7 (ref 5–15)
BUN: 22 mg/dL (ref 6–23)
CHLORIDE: 104 meq/L (ref 96–112)
CO2: 27 mmol/L (ref 19–32)
Calcium: 9.9 mg/dL (ref 8.4–10.5)
Creatinine, Ser: 1.11 mg/dL (ref 0.50–1.35)
GFR calc Af Amer: 77 mL/min — ABNORMAL LOW (ref >90)
GFR calc non Af Amer: 67 mL/min — ABNORMAL LOW (ref >90)
Glucose, Bld: 142 mg/dL — ABNORMAL HIGH (ref 70–99)
Potassium: 3.9 mmol/L (ref 3.5–5.1)
Sodium: 138 mmol/L (ref 135–145)

## 2014-05-15 LAB — GLUCOSE, CAPILLARY
GLUCOSE-CAPILLARY: 198 mg/dL — AB (ref 70–99)
Glucose-Capillary: 171 mg/dL — ABNORMAL HIGH (ref 70–99)

## 2014-05-15 MED ORDER — METOPROLOL TARTRATE 25 MG PO TABS: 25.0000 mg | ORAL_TABLET | Freq: Two times a day (BID) | ORAL | Status: DC

## 2014-05-15 MED ORDER — POTASSIUM CHLORIDE ER 10 MEQ PO TBCR: 20.0000 meq | EXTENDED_RELEASE_TABLET | Freq: Every day | ORAL | Status: DC

## 2014-05-15 MED ORDER — FUROSEMIDE 40 MG PO TABS: 40.0000 mg | ORAL_TABLET | Freq: Every day | ORAL | Status: DC

## 2014-05-15 MED ORDER — FUROSEMIDE 40 MG PO TABS
40.0000 mg | ORAL_TABLET | Freq: Every day | ORAL | Status: DC
  Filled 2014-05-15: qty 1

## 2014-05-15 NOTE — Progress Notes (Signed)
Physical Therapy Treatment Patient Details Name: Joshua Lara MRN: [id number] DOB: [birth year] Today's Date: 05/15/2014    History of Present Illness pt presents with Respiratory Failure with CHF.      PT Comments    Patient very sweet and agreeable to ambulation and stair practice. Patient was educated on increasing activity slowly. He is very involved in his local [employer] classes but stated lately he wasn't going because he was feeling very tired with lack of energy. Educated on importance of these activities and incorporating them back in slowly as tolerated. 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 Patient educated on making sure he uses his cane if necessary and keeping his phone on him during his rounds for safety.   Follow Up Recommendations  No PT follow up;Supervision - Intermittent     Equipment Recommendations  None recommended by PT    Recommendations for Other Services       Precautions / Restrictions Precautions Precautions: Fall    Mobility  Bed Mobility               General bed mobility comments: pt sitting in recliner.    Transfers Overall transfer level: Modified independent                  Ambulation/Gait Ambulation/Gait assistance: Modified independent (Device/Increase time) Ambulation Distance (Feet): 300 Feet Assistive device: None       General Gait Details: pt maintains somewhat wide BOS and has a lateral sway with ambulation.  pt becomes SOB easily but able to self monitor to know when to rest and take breaks   Stairs Stairs: Yes Stairs assistance: Supervision Stair Management: Step to pattern;Forwards;One rail Right Number of Stairs: 6 General stair comments: Supervision for safety  Wheelchair Mobility    Modified Rankin (Stroke Patients Only)       Balance                                     Cognition Arousal/Alertness: Awake/alert Behavior During Therapy: WFL for tasks assessed/performed Overall Cognitive Status: Within Functional Limits for tasks assessed                      Exercises      General Comments        Pertinent Vitals/Pain Pain Assessment: No/denies pain    Home Living                      Prior Function            PT Goals (current goals can now be found in the care plan section) Progress towards PT goals: Progressing toward goals    Frequency  Min 3X/week    PT Plan Current plan remains appropriate    Co-evaluation             End of Session   Activity Tolerance: Patient tolerated treatment well Patient left: in chair;with call bell/phone within reach     Time: 1401-1421 PT Time Calculation (min) (ACUTE ONLY): 20 min  Charges:  $Gait Training: 8-22 mins                    G Codes:      Jacqualyn Posey 05/15/2014,  2:33 PM  05/15/2014 Jacqualyn Posey PTA [phone] pager [phone] office

## 2014-05-15 NOTE — Discharge Summary (Signed)
Physician Discharge Summary  Joshua Lara MVE:[id number] DOB: 09-07-1945 DOA: 05/10/2014  PCP: Leonides Sake, MD  Admit date: 05/10/2014 Discharge date: 05/15/2014  Time spent: 35 minutes  Recommendations for Outpatient Follow-up:  1. Please follow up on patient's volume status, he was treated for acute CHF, discharged on Lasix 40 mg PO q daily 2. Obtain BMP on hospital follow up  Discharge Diagnoses:  Principal Problem:   Acute respiratory failure with hypoxemia Active Problems:   Acute on chronic diastolic heart failure   DM (diabetes mellitus)   HTN (hypertension)   Hypothyroidism   GERD (gastroesophageal reflux disease)   RA (rheumatoid arthritis)   Obesity   CAD in native artery   Dyspnea   OSA on CPAP   Abnormal CT scan, chest   Discharge Condition: Stable  Diet recommendation: Heart Healthy  Filed Weights   05/13/14 2836 05/14/14 0534 05/15/14 0536  Weight: 127.506 kg (281 lb 1.6 oz) 126.2 kg (278 lb 3.5 oz) 125.3 kg (276 lb 3.8 oz)    History of present illness:  Joshua Lara is a 69 y.o. male with Past medical history of hypertension, diabetes mellitus, hypothyroidism, coronary artery disease, GERD, rheumatoid arthritis on immunosuppression, sleep apnea on BiPAP. The patient presented with complaints of shortness of breath that has been ongoing for last 3-4 weeks and progressively worsening. Initially he was short of breath at more than usual exertion but now he is short of breath even at rest. Since last 3-4 days it has progressively worsened. He also has a cough without any expectoration. He denies any fever or chills denies any recent upper respiratory infection. He has grandchildren who were sick. He denies any nausea vomiting or choking episode. He has acid reflux and occasionally takes medications for that. He denies any diarrhea or constipation denies any burning urination denies any decrease in urination. He has on and off leg swelling. He has  lost 10 pounds as he is trying to lose weight. He mentions he is compliant with BiPAP.  Hospital Course:  Patient is a pleasant 69 year old gentleman with past medical history of hypertension, type 2 diabetes mellitus, hypothyroidism,  Who was admitted to the medicine service on 05/10/2014. He  Presented with complaints of increasing shortness of breath that had been progressively worsening over the past 3-4 weeks.  Initial workup included a chest x-ray which revealed interstitial edema with evidence of cardiomegaly. There is no evidence of airspace consolidation pneumothorax or pleural fluid.  Symptoms felt to be secondary to acute on chronic diastolic congestive heart failure for which he was started on IV Lasix.  A transthoracic echocardiogram was performed on 05/14/2014 which revealed an ejection fraction of 60-65% with grade 1 diastolic dysfunction.  Patient showing market clinical improvement, having a net negative fluid balance of 6.2 L by 05/15/2014.  He diuresed a total of 4.2 L.   Given significant clinical improvement he was discharged in stable condition on 05/15/2014  on Lasix 40 mg by mouth daily. Please follow-up on a BMP on hospital follow-up visit.  Procedures:   Transthoracic echocardiogram performed on 05/14/2014 - Left ventricle: The cavity size was normal. Wall thickness was normal. Systolic function was normal. The estimated ejection fraction was in the range of 60% to 65%. Images were inadequate for LV wall motion assessment. Doppler parameters are consistent with abnormal left ventricular relaxation (grade 1 diastolic dysfunction). Indeterminate filling pressures.  Discharge Exam: Filed Vitals:   05/15/14 1122  BP: 117/61  Pulse: 96  Temp:  Resp:     General:  Nontoxic appearing, awake, alert, ambulating around his room, states feeling better, states feeling ready to go home today. Cardiovascular:  Regular rate and rhythm normal S1-S2 Respiratory:  Normal  respiratory effort, lungs are clear to auscultation bilaterally Extremities:  Trace edema to lower extremities bilaterally  Discharge Instructions   Discharge Instructions    (HEART FAILURE PATIENTS) Call MD:  Anytime you have any of the following symptoms: 1) 3 pound weight gain in 24 hours or 5 pounds in 1 week 2) shortness of breath, with or without a dry hacking cough 3) swelling in the hands, feet or stomach 4) if you have to sleep on extra pillows at night in order to breathe.    Complete by:  As directed      Call MD for:  difficulty breathing, headache or visual disturbances    Complete by:  As directed      Call MD for:  extreme fatigue    Complete by:  As directed      Call MD for:  hives    Complete by:  As directed      Call MD for:  persistant dizziness or light-headedness    Complete by:  As directed      Call MD for:  persistant nausea and vomiting    Complete by:  As directed      Call MD for:  redness, tenderness, or signs of infection (pain, swelling, redness, odor or green/yellow discharge around incision site)    Complete by:  As directed      Call MD for:  severe uncontrolled pain    Complete by:  As directed      Call MD for:  temperature >100.4    Complete by:  As directed      Diet - low sodium heart healthy    Complete by:  As directed      Increase activity slowly    Complete by:  As directed           Current Discharge Medication List    START taking these medications   Details  furosemide (LASIX) 40 MG tablet Take 1 tablet (40 mg total) by mouth daily. Qty: 30 tablet, Refills: 1    potassium chloride (K-DUR) 10 MEQ tablet Take 2 tablets (20 mEq total) by mouth daily. Qty: 60 tablet, Refills: 0      CONTINUE these medications which have NOT CHANGED   Details  albuterol (PROVENTIL HFA;VENTOLIN HFA) 108 (90 BASE) MCG/ACT inhaler Inhale 2 puffs into the lungs every 6 (six) hours as needed. For shortness of breath     aspirin EC 81 MG tablet Take  81 mg by mouth daily.      folic acid (FOLVITE) 1 MG tablet Take 1 mg by mouth 3 (three) times daily. Take 3 every day per patient    HYDROcodone-acetaminophen (NORCO) 10-325 MG per tablet Take 1 tablet by mouth every 6 (six) hours as needed for pain. For pain Qty: 45 tablet, Refills: 0    losartan (COZAAR) 25 MG tablet Take 25 mg by mouth daily.    magnesium oxide (MAG-OX) 400 MG tablet Take 400 mg by mouth daily.      meclizine (ANTIVERT) 25 MG tablet Take 25 mg by mouth 3 (three) times daily as needed. For dizziness     methotrexate (RHEUMATREX) 2.5 MG tablet Take 15 mg by mouth once a week. Caution:Chemotherapy. Protect from light. Take on mondays  mometasone (NASONEX) 50 MCG/ACT nasal spray Place 2 sprays into the nose daily.      Multiple Vitamins-Minerals (MULTIVITAMINS THER. W/MINERALS) TABS Take 1 tablet by mouth daily.      nitroGLYCERIN (NITROSTAT) 0.4 MG SL tablet Place 0.4 mg under the tongue every 5 (five) minutes as needed for chest pain.    omega-3 acid ethyl esters (LOVAZA) 1 G capsule Take by mouth 2 (two) times daily.      Probiotic Product (PROBIOTIC PO) Take 1 tablet by mouth daily.    ranitidine (ZANTAC) 150 MG capsule Take 150 mg by mouth daily as needed for heartburn.     sitaGLIPtan-metformin (JANUMET) 50-1000 MG per tablet Take 1 tablet by mouth 2 (two) times daily with a meal.      carvedilol (COREG) 6.25 MG tablet Take 6.25 mg by mouth 2 (two) times daily with a meal.      glimepiride (AMARYL) 4 MG tablet Take 4 mg by mouth daily before breakfast.      levothyroxine (SYNTHROID, LEVOTHROID) 100 MCG tablet Take 100 mcg by mouth daily.      pregabalin (LYRICA) 50 MG capsule Take 50 mg by mouth 4 (four) times daily.        STOP taking these medications     hydrochlorothiazide (HYDRODIURIL) 25 MG tablet      NITROGLYCERIN RE        Allergies  Allergen Reactions  . Etanercept Cough   Follow-up Information    Follow up with [facility], MD  In 1 week.   Specialty:  Family Medicine   Contact information:   Dr. Daiva Eves [address] [place] [address] [phone]       Follow up with [facility] In 2 weeks.   Specialty:  Cardiology   Contact information:   [address] [place] [address] [phone]       The results of significant diagnostics from this hospitalization (including imaging, microbiology, ancillary and laboratory) are listed below for reference.    Significant Diagnostic Studies: Dg Chest 2 View  05/10/2014   CLINICAL DATA:  Shortness of breath and mid chest pain. History of hypertension, CHF and diabetes.  EXAM: CHEST - 2 VIEW  COMPARISON:  06/11/2013  FINDINGS: The heart is mildly enlarged. Mild interstitial edema is suspected superimposed on chronic lung disease. There is no evidence of airspace consolidation, pneumothorax, nodule or pleural fluid. The visualized skeletal structures are unremarkable.  IMPRESSION: Suspect mild interstitial edema.  The heart is mildly enlarged.   Electronically Signed   By: Aletta Edouard M.D.   On: 05/10/2014 17:29   Ct Angio Chest Pe W/cm &/or Wo Cm  05/10/2014   CLINICAL DATA:  Chest pain, shortness of breath  EXAM: CT ANGIOGRAPHY CHEST WITH CONTRAST  TECHNIQUE: Multidetector CT imaging of the chest was performed using the standard protocol during bolus administration of intravenous contrast. Multiplanar CT image reconstructions and MIPs were obtained to evaluate the vascular anatomy.  CONTRAST:  111mL OMNIPAQUE IOHEXOL 350 MG/ML SOLN  COMPARISON:  None.  FINDINGS: Knee peripheral subsegmental pulmonary arterial branches are suboptimally visualized secondary to respiratory motion. There is adequate opacification of the remainder of the pulmonary arteries. There is no pulmonary embolus. The main pulmonary artery, right main pulmonary artery and left main pulmonary arteries are normal in size. The heart  size is normal. There is no pericardial effusion. There is coronary artery atherosclerosis involving the LAD.  There is bibasilar  atelectasis. There is no focal consolidation, pleural effusion or pneumothorax.  Mildly enlarged subcarinal lymph node measuring 16 mm in short axis. Mildly enlarged right lower paratracheal lymph node measuring 13 mm in short axis.  There is no lytic or blastic osseous lesion. There are old left rib fractures again noted. There are anterior bridging osteophytes of the thoracic spine as can be seen with diffuse idiopathic skeletal hyperostosis.  The visualized portions of the upper abdomen are unremarkable.  Review of the MIP images confirms the above findings.  IMPRESSION: 1. No evidence of pulmonary embolus. Peripheral subsegmental pulmonary arterial branches are suboptimally evaluated secondary to respiratory motion.   Electronically Signed   By: Kathreen Devoid   On: 05/10/2014 20:35    Microbiology: No results found for this or any previous visit (from the past 240 hour(s)).   Labs: Basic Metabolic Panel:  Recent Labs Lab 05/10/14 1705 05/11/14 0634 05/12/14 0608 05/13/14 0253 05/13/14 2052 05/15/14 0430  NA 141 139 138 138  --  138  K 3.2* 3.1* 3.4* 3.4* 3.8 3.9  CL 108 105 102 102  --  104  CO2 22 24 22 24   --  27  GLUCOSE 118* 153* 187* 149*  --  142*  BUN <5* <5* 8 11  --  22  CREATININE 0.71 0.79 0.91 0.90  --  1.11  CALCIUM 9.5 9.3 9.5 9.8  --  9.9  MG  --   --  1.9  --  1.7  --    Liver Function Tests:  Recent Labs Lab 05/11/14 0634  AST 37  ALT 36  ALKPHOS 99  BILITOT 1.0  PROT 7.1  ALBUMIN 3.8   No results for input(s): LIPASE, AMYLASE in the last 168 hours. No results for input(s): AMMONIA in the last 168 hours. CBC:  Recent Labs Lab 05/10/14 1705 05/11/14 0634  WBC 6.5 6.6  NEUTROABS  --  4.1  HGB 11.6* 13.4  HCT 33.4* 37.8*  MCV 88.1 89.2  PLT 220 273   Cardiac Enzymes:  Recent Labs Lab 05/10/14 2359 05/11/14 0634  05/11/14 1140 05/14/14 0719 05/14/14 1425  TROPONINI <0.03 <0.03 <0.03 <0.03 <0.03   BNP: BNP (last 3 results) No results for input(s): PROBNP in the last 8760 hours. CBG:  Recent Labs Lab 05/14/14 1215 05/14/14 1657 05/14/14 2157 05/15/14 0656 05/15/14 1153  GLUCAP 173* 125* 116* 171* 198*       Signed:  Nicci Vaughan  [facility] 05/15/2014, 1:07 PM

## 2014-05-15 NOTE — Clinical Documentation Improvement (Addendum)
MD's, NP's, PA's  Possible Clinical Conditions?  Being documented "acute on chronic nonspecific CHF". Please clarify the type of heart failure  "systolic or diastolic" along with the acuity.  Thanks   Acute Systolic & Diastolic Congestive Heart Failure  Acute on Chronic Systolic Congestive Heart Failure  Acute on Chronic Diastolic Congestive Heart Failure  Acute on Chronic Systolic & Diastolic Congestive Heart Failure  Other Condition  Cannot Clinically Determine  Thank You, Ree Kida ,RN Clinical Documentation Specialist:  [facility]

## 2014-05-15 NOTE — Plan of Care (Signed)
Problem: Phase I Progression Outcomes Goal: EF % per last Echo/documented,Core Reminder form on chart Outcome: Completed/Met Date Met:  05/15/14 60-65%(05-14-14)

## 2014-05-15 NOTE — Progress Notes (Signed)
UR completed Joshua Fleagle K. Abagayle Klutts, RN, BSN, Joshua Lara, CCM  05/15/2014 3:10 PM

## 2014-05-17 DIAGNOSIS — I959 Hypotension, unspecified: Secondary | ICD-10-CM | POA: Diagnosis not present

## 2014-05-17 DIAGNOSIS — I5030 Unspecified diastolic (congestive) heart failure: Secondary | ICD-10-CM | POA: Diagnosis not present

## 2014-05-17 DIAGNOSIS — Z79899 Other long term (current) drug therapy: Secondary | ICD-10-CM | POA: Diagnosis not present

## 2014-05-17 DIAGNOSIS — E119 Type 2 diabetes mellitus without complications: Secondary | ICD-10-CM | POA: Diagnosis not present

## 2014-05-22 ENCOUNTER — Encounter: Payer: Self-pay | Admitting: *Deleted

## 2014-05-22 DIAGNOSIS — H4011X3 Primary open-angle glaucoma, severe stage: Secondary | ICD-10-CM | POA: Diagnosis not present

## 2014-05-22 DIAGNOSIS — H2511 Age-related nuclear cataract, right eye: Secondary | ICD-10-CM | POA: Diagnosis not present

## 2014-05-22 DIAGNOSIS — H4011X4 Primary open-angle glaucoma, indeterminate stage: Secondary | ICD-10-CM | POA: Diagnosis not present
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
## 2014-05-23 ENCOUNTER — Ambulatory Visit (INDEPENDENT_AMBULATORY_CARE_PROVIDER_SITE_OTHER): Payer: Medicare Other | Admitting: Cardiovascular Disease

## 2014-05-23 ENCOUNTER — Encounter: Payer: Self-pay | Admitting: *Deleted

## 2014-05-23 ENCOUNTER — Encounter: Payer: Self-pay | Admitting: Cardiovascular Disease

## 2014-05-23 VITALS — BP 98/66 | HR 117 | Ht 70.0 in | Wt 277.8 lb

## 2014-05-23 DIAGNOSIS — R0600 Dyspnea, unspecified: Secondary | ICD-10-CM | POA: Diagnosis not present

## 2014-05-23 DIAGNOSIS — Z01818 Encounter for other preprocedural examination: Secondary | ICD-10-CM | POA: Diagnosis not present

## 2014-05-23 DIAGNOSIS — G4733 Obstructive sleep apnea (adult) (pediatric): Secondary | ICD-10-CM

## 2014-05-23 DIAGNOSIS — R0602 Shortness of breath: Secondary | ICD-10-CM | POA: Diagnosis not present

## 2014-05-23 DIAGNOSIS — I10 Essential (primary) hypertension: Secondary | ICD-10-CM

## 2014-05-23 DIAGNOSIS — I2510 Atherosclerotic heart disease of native coronary artery without angina pectoris: Secondary | ICD-10-CM

## 2014-05-23 DIAGNOSIS — Z9989 Dependence on other enabling machines and devices: Secondary | ICD-10-CM

## 2014-05-23 NOTE — Assessment & Plan Note (Signed)
Body habitus makes noninvasive imaging difficult  History of moderate LAD disease and diabetic  Favor cath to r/o progressive disease

## 2014-05-23 NOTE — Assessment & Plan Note (Signed)
Well controlled.  Continue current medications and low sodium Dash type diet.    

## 2014-05-23 NOTE — Patient Instructions (Signed)
Your physician recommends that you schedule a follow-up appointment in: AFTER  CATH WITH DR E[place] have been referred to  [place]  Your physician recommends that you return for lab work in:05-29-14    CBC  BMET  BNP      A chest x-ray takes a picture of the organs and structures inside the chest, including the heart, lungs, and blood vessels. This test can show several things, including, whether the heart is enlarges; whether fluid is building up in the lungs; and whether pacemaker / defibrillator leads are still in place.  05-29-14   Your physician has requested that you have a cardiac catheterization. Cardiac catheterization is used to diagnose and/or treat various heart conditions. Doctors may recommend this procedure for a number of different reasons. The most common reason is to evaluate chest pain. Chest pain can be a symptom of coronary artery disease (CAD), and cardiac catheterization can show whether plaque is narrowing or blocking your heart's arteries. This procedure is also used to evaluate the valves, as well as measure the blood flow and oxygen levels in different parts of your heart. For further information please visit [email or web address]. Please follow instruction sheet, as given. 05-30-14   RIGHT  AND  LEFT

## 2014-05-23 NOTE — Assessment & Plan Note (Signed)
Why disproportionate to "diastonlic dysfunction"  BNP was normal on admission.  Has responded to diuretic but still dyspnic  Spoke with Dr Jeffie Pollock arrange right and left cath on 1/19 with him  Right heart to assess filling pressures and r/o pulmonary hypertension.  Lab called scheduled and he will have CXR and labs on Monday 05/29/13  Hold dibetic meds and diuretic morning of cath  DB indicates likely right radial and right IJ approach  Risks including bleeding and CVA discussed and patient willing to proceed.  Has had contrast before with no issues

## 2014-05-23 NOTE — Assessment & Plan Note (Signed)
Wearing CPAP regularly Suspect he has significant restrictive lung disease  Will refer to pulmonary Will likely need PFT;s and DLCO post cath as I don't think his dyspnea and poor functional capacity will be explained by heart disease

## 2014-05-24 ENCOUNTER — Encounter (HOSPITAL_COMMUNITY): Payer: Self-pay | Admitting: Pharmacy Technician

## 2014-05-29 ENCOUNTER — Other Ambulatory Visit: Payer: Self-pay | Admitting: Cardiovascular Disease

## 2014-05-29 ENCOUNTER — Ambulatory Visit (INDEPENDENT_AMBULATORY_CARE_PROVIDER_SITE_OTHER)
Admission: RE | Admit: 2014-05-29 | Discharge: 2014-05-29 | Disposition: A | Discharge status: Home / Self Care | Payer: Medicare Other | Source: Ambulatory Visit | Attending: Cardiovascular Disease | Admitting: Cardiovascular Disease

## 2014-05-29 ENCOUNTER — Other Ambulatory Visit (INDEPENDENT_AMBULATORY_CARE_PROVIDER_SITE_OTHER): Payer: Medicare Other

## 2014-05-29 DIAGNOSIS — Z01818 Encounter for other preprocedural examination: Secondary | ICD-10-CM

## 2014-05-29 DIAGNOSIS — R0600 Dyspnea, unspecified: Secondary | ICD-10-CM | POA: Diagnosis not present

## 2014-05-29 LAB — BASIC METABOLIC PANEL
BUN: 9 mg/dL (ref 6–23)
CHLORIDE: 100 meq/L (ref 96–112)
CO2: 24 meq/L (ref 19–32)
CREATININE: 0.84 mg/dL (ref 0.40–1.50)
Calcium: 10.3 mg/dL (ref 8.4–10.5)
GFR: 96.68 mL/min (ref >60.00)
Glucose, Bld: 245 mg/dL — ABNORMAL HIGH (ref 70–99)
Potassium: 3.9 mEq/L (ref 3.5–5.1)
Sodium: 136 mEq/L (ref 135–145)

## 2014-05-29 LAB — CBC WITH DIFFERENTIAL/PLATELET
BASOS PCT: 0.9 % (ref 0.0–3.0)
Basophils Absolute: 0.1 10*3/uL (ref 0.0–0.1)
EOS ABS: 0.7 10*3/uL (ref 0.0–0.7)
EOS PCT: 9.6 % — AB (ref 0.0–5.0)
HCT: 41.7 % (ref 39.0–52.0)
Hemoglobin: 14.1 g/dL (ref 13.0–17.0)
LYMPHS PCT: 17.2 % (ref 12.0–46.0)
Lymphs Abs: 1.2 10*3/uL (ref 0.7–4.0)
MCHC: 33.7 g/dL (ref 30.0–36.0)
MCV: 91.6 fl (ref 78.0–100.0)
Monocytes Absolute: 0.8 10*3/uL (ref 0.1–1.0)
Monocytes Relative: 11.3 % (ref 3.0–12.0)
Neutro Abs: 4.4 10*3/uL (ref 1.4–7.7)
Neutrophils Relative %: 61 % (ref 43.0–77.0)
Platelets: 294 10*3/uL (ref 150.0–400.0)
RBC: 4.55 Mil/uL (ref 4.22–5.81)
RDW: 14.1 % (ref 11.5–15.5)
WBC: 7.2 10*3/uL (ref 4.0–10.5)

## 2014-05-29 LAB — BRAIN NATRIURETIC PEPTIDE: Pro B Natriuretic peptide (BNP): 8 pg/mL (ref 0.0–100.0)

## 2014-05-30 ENCOUNTER — Encounter (HOSPITAL_COMMUNITY): Payer: Self-pay | Admitting: Internal Medicine

## 2014-05-30 ENCOUNTER — Ambulatory Visit (HOSPITAL_COMMUNITY)
Admission: RE | Admit: 2014-05-30 | Discharge: 2014-05-30 | Disposition: A | Discharge status: Home / Self Care | Payer: Medicare Other | Source: Ambulatory Visit | Attending: Internal Medicine | Admitting: Internal Medicine

## 2014-05-30 ENCOUNTER — Encounter (HOSPITAL_COMMUNITY)
Admission: RE | Disposition: A | Discharge status: Home / Self Care | Payer: Self-pay | Source: Ambulatory Visit | Attending: Internal Medicine

## 2014-05-30 DIAGNOSIS — G4733 Obstructive sleep apnea (adult) (pediatric): Secondary | ICD-10-CM | POA: Diagnosis not present

## 2014-05-30 DIAGNOSIS — I2510 Atherosclerotic heart disease of native coronary artery without angina pectoris: Secondary | ICD-10-CM | POA: Diagnosis not present

## 2014-05-30 DIAGNOSIS — M549 Dorsalgia, unspecified: Secondary | ICD-10-CM | POA: Insufficient documentation

## 2014-05-30 DIAGNOSIS — E119 Type 2 diabetes mellitus without complications: Secondary | ICD-10-CM | POA: Insufficient documentation

## 2014-05-30 DIAGNOSIS — Z8701 Personal history of pneumonia (recurrent): Secondary | ICD-10-CM | POA: Diagnosis not present

## 2014-05-30 DIAGNOSIS — R079 Chest pain, unspecified: Secondary | ICD-10-CM | POA: Diagnosis present

## 2014-05-30 DIAGNOSIS — I10 Essential (primary) hypertension: Secondary | ICD-10-CM | POA: Insufficient documentation

## 2014-05-30 DIAGNOSIS — F329 Major depressive disorder, single episode, unspecified: Secondary | ICD-10-CM | POA: Diagnosis not present

## 2014-05-30 DIAGNOSIS — G8929 Other chronic pain: Secondary | ICD-10-CM | POA: Diagnosis not present

## 2014-05-30 DIAGNOSIS — I509 Heart failure, unspecified: Secondary | ICD-10-CM | POA: Diagnosis not present

## 2014-05-30 DIAGNOSIS — Z79899 Other long term (current) drug therapy: Secondary | ICD-10-CM | POA: Diagnosis not present

## 2014-05-30 DIAGNOSIS — E039 Hypothyroidism, unspecified: Secondary | ICD-10-CM | POA: Diagnosis not present

## 2014-05-30 DIAGNOSIS — E780 Pure hypercholesterolemia: Secondary | ICD-10-CM | POA: Insufficient documentation

## 2014-05-30 DIAGNOSIS — R0600 Dyspnea, unspecified: Secondary | ICD-10-CM | POA: Diagnosis present

## 2014-05-30 DIAGNOSIS — Z87891 Personal history of nicotine dependence: Secondary | ICD-10-CM | POA: Insufficient documentation

## 2014-05-30 DIAGNOSIS — R9431 Abnormal electrocardiogram [ECG] [EKG]: Secondary | ICD-10-CM | POA: Insufficient documentation

## 2014-05-30 DIAGNOSIS — M1389 Other specified arthritis, multiple sites: Secondary | ICD-10-CM | POA: Diagnosis not present

## 2014-05-30 DIAGNOSIS — Z7982 Long term (current) use of aspirin: Secondary | ICD-10-CM | POA: Insufficient documentation

## 2014-05-30 DIAGNOSIS — R000 Tachycardia, unspecified: Secondary | ICD-10-CM | POA: Insufficient documentation

## 2014-05-30 DIAGNOSIS — K219 Gastro-esophageal reflux disease without esophagitis: Secondary | ICD-10-CM | POA: Insufficient documentation

## 2014-05-30 HISTORY — PX: LEFT AND RIGHT HEART CATHETERIZATION WITH CORONARY ANGIOGRAM: SHX5449

## 2014-05-30 LAB — POCT I-STAT 3, VENOUS BLOOD GAS (G3P V)
ACID-BASE DEFICIT: 3 mmol/L — AB (ref 0.0–2.0)
Acid-base deficit: 3 mmol/L — ABNORMAL HIGH (ref 0.0–2.0)
Acid-base deficit: 3 mmol/L — ABNORMAL HIGH (ref 0.0–2.0)
Bicarbonate: 21.4 mEq/L (ref 20.0–24.0)
Bicarbonate: 21.8 mEq/L (ref 20.0–24.0)
Bicarbonate: 21.8 mEq/L (ref 20.0–24.0)
O2 SAT: 63 %
O2 Saturation: 57 %
O2 Saturation: 64 %
PCO2 VEN: 36.1 mmHg — AB (ref 45.0–50.0)
PCO2 VEN: 36.7 mmHg — AB (ref 45.0–50.0)
PH VEN: 7.381 — AB (ref 7.250–7.300)
PO2 VEN: 33 mmHg (ref 30.0–45.0)
TCO2: 22 mmol/L (ref 0–100)
TCO2: 23 mmol/L (ref 0–100)
TCO2: 23 mmol/L (ref 0–100)
pCO2, Ven: 35.9 mmHg — ABNORMAL LOW (ref 45.0–50.0)
pH, Ven: 7.382 — ABNORMAL HIGH (ref 7.250–7.300)
pH, Ven: 7.39 — ABNORMAL HIGH (ref 7.250–7.300)
pO2, Ven: 30 mmHg (ref 30.0–45.0)
pO2, Ven: 34 mmHg (ref 30.0–45.0)

## 2014-05-30 LAB — POCT I-STAT 3, ART BLOOD GAS (G3+)
Acid-base deficit: 3 mmol/L — ABNORMAL HIGH (ref 0.0–2.0)
Bicarbonate: 20.4 mEq/L (ref 20.0–24.0)
O2 Saturation: 96 %
PO2 ART: 77 mmHg — AB (ref 80.0–100.0)
TCO2: 21 mmol/L (ref 0–100)
pCO2 arterial: 31.2 mmHg — ABNORMAL LOW (ref 35.0–45.0)
pH, Arterial: 7.423 (ref 7.350–7.450)

## 2014-05-30 LAB — PROTIME-INR
INR: 1.06 (ref 0.00–1.49)
PROTHROMBIN TIME: 13.9 s (ref 11.6–15.2)

## 2014-05-30 LAB — GLUCOSE, CAPILLARY
GLUCOSE-CAPILLARY: 207 mg/dL — AB (ref 70–99)
Glucose-Capillary: 104 mg/dL — ABNORMAL HIGH (ref 70–99)

## 2014-05-30 SURGERY — LEFT AND RIGHT HEART CATHETERIZATION WITH CORONARY ANGIOGRAM

## 2014-05-30 MED ORDER — FENTANYL CITRATE 0.05 MG/ML IJ SOLN
INTRAMUSCULAR | Status: AC
  Filled 2014-05-30: qty 2

## 2014-05-30 MED ORDER — LIDOCAINE HCL (PF) 1 % IJ SOLN
INTRAMUSCULAR | Status: AC
  Filled 2014-05-30: qty 30

## 2014-05-30 MED ORDER — NITROGLYCERIN 0.4 MG SL SUBL
SUBLINGUAL_TABLET | SUBLINGUAL | Status: AC
  Administered 2014-05-30: 0.4 mg via SUBLINGUAL
  Filled 2014-05-30: qty 1

## 2014-05-30 MED ORDER — VERAPAMIL HCL 2.5 MG/ML IV SOLN
INTRAVENOUS | Status: AC
  Filled 2014-05-30: qty 2

## 2014-05-30 MED ORDER — MIDAZOLAM HCL 2 MG/2ML IJ SOLN
INTRAMUSCULAR | Status: AC
  Filled 2014-05-30: qty 2

## 2014-05-30 MED ORDER — ONDANSETRON HCL 4 MG/2ML IJ SOLN: 4.0000 mg | Freq: Four times a day (QID) | INTRAMUSCULAR | Status: DC | PRN

## 2014-05-30 MED ORDER — HEPARIN SODIUM (PORCINE) 1000 UNIT/ML IJ SOLN
INTRAMUSCULAR | Status: AC
  Filled 2014-05-30: qty 1

## 2014-05-30 MED ORDER — ACETAMINOPHEN 325 MG PO TABS: 650.0000 mg | ORAL_TABLET | ORAL | Status: DC | PRN

## 2014-05-30 MED ORDER — ASPIRIN 81 MG PO CHEW
81.0000 mg | CHEWABLE_TABLET | ORAL | Status: AC
Start: 2014-05-31 — End: 2014-05-30
  Administered 2014-05-30: 81 mg via ORAL

## 2014-05-30 MED ORDER — HEPARIN (PORCINE) IN NACL 2-0.9 UNIT/ML-% IJ SOLN
INTRAMUSCULAR | Status: AC
  Filled 2014-05-30: qty 1000

## 2014-05-30 MED ORDER — SODIUM CHLORIDE 0.9 % IJ SOLN: 3.0000 mL | Freq: Two times a day (BID) | INTRAMUSCULAR | Status: DC

## 2014-05-30 MED ORDER — SODIUM CHLORIDE 0.9 % IJ SOLN: 3.0000 mL | INTRAMUSCULAR | Status: DC | PRN

## 2014-05-30 MED ORDER — NITROGLYCERIN 1 MG/10 ML FOR IR/CATH LAB
INTRA_ARTERIAL | Status: AC
  Filled 2014-05-30: qty 10

## 2014-05-30 MED ORDER — SODIUM CHLORIDE 0.9 % IV SOLN: 250.0000 mL | INTRAVENOUS | Status: DC | PRN

## 2014-05-30 MED ORDER — SODIUM CHLORIDE 0.9 % IV SOLN: INTRAVENOUS | Status: AC

## 2014-05-30 MED ORDER — NITROGLYCERIN 0.4 MG SL SUBL
0.4000 mg | SUBLINGUAL_TABLET | SUBLINGUAL | Status: DC | PRN
  Administered 2014-05-30: 0.4 mg via SUBLINGUAL
  Filled 2014-05-30: qty 25

## 2014-05-30 MED ORDER — ASPIRIN 81 MG PO CHEW
CHEWABLE_TABLET | ORAL | Status: AC
  Filled 2014-05-30: qty 1

## 2014-05-30 NOTE — CV Procedure (Signed)
Cardiac Cath Procedure Note:  Indication:  Dyspnea/CP  Procedures performed:  1) Selective coronary angiography 2) Left heart catheterization 3) Left ventriculogram 4) Right heart catheterization  Description of procedure:   The risks and indication of the procedure were explained. Consent was signed and placed on the chart. An appropriate timeout was taken prior to the procedure.  The pre-existing PIV in the right antecubital fossa was removed over a wire and a 5FR sheath was placed in the brachial vein. A swan-ganz catheter was used for the RHC.   After a normal Allen's test was confirmed, the right wrist was prepped and draped in the routine sterile fashion and anesthetized with 1% local lidocaine. A 5 FR arterial sheath was then placed in the right radial artery using a modified Seldinger technique. Systemic heparin was administered. 3mg  IV verapamil was given through the sheath. Standard catheters including a JL 3.5, JR4 and straight pigtail were used. All catheter exchanges were made over a wire.  Complications:  None apparent  Findings:  RA = 7 RV = 26/4/9 PA = 28/8 (19) PCWP = 9 Fick CO/CI = 5.0/2.1 PVR = 2.0 WU SVR = 1340 Ao Pressure:104/70 (85) LV Pressure: 106/11/15 There was no signficant gradient across the aortic valve on pullback.  Left main: Normal  LAD: Long vessel wraps apex. Gives off two small diagonals. Mild plaque in proximal LAD. Myocardial bridging segment in mid LAD. After this there was 30-40% lesion in mid LAD.   LCX: Large ramus. Large branching OM-1. Small to moderate-sized AV-groove LCX. Normal  RCA: Large dominant dominant vessel. 30% plaque in mid to distal segment. Mild plaque distally  LV-gram done in the RAO projection: Ejection fraction = 55% no RWMA  Assessment: 1. Mild non-obstructive CAD without change from previous 2. Normal right heart pressures  Plan/Discussion:  Suspect dyspnea related to body habitus and deconditioning.  Recommend PFTs and ongoing weight loss efforts with diet and exercise.   Glori Bickers MD 2:13 PM
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
## 2014-05-30 NOTE — Progress Notes (Addendum)
Site area:right brachial 5 fr venous sheath  Site Prior to Removal:  Level 0  Pressure Applied For 15 MINUTES    Minutes Beginning at 1430p  Manual:   Yes.    Patient Status During Pull:  stable  Post Pull Groin Site:  Level 0  Post Pull Instructions Given:  Yes.    Post Pull Pulses Present:  Yes.    Dressing Applied:  Yes.    Comments:  VS remain stable during sheath pull.  Pt denies any discomfort at this time

## 2014-05-30 NOTE — Discharge Instructions (Signed)
Radial Site Care °Refer to this sheet in the next few weeks. These instructions provide you with information on caring for yourself after your procedure. Your caregiver may also give you more specific instructions. Your treatment has been planned according to current medical practices, but problems sometimes occur. Call your caregiver if you have any problems or questions after your procedure. °HOME CARE INSTRUCTIONS °· You may shower the day after the procedure. Remove the bandage (dressing) and gently wash the site with plain soap and water. Gently pat the site dry. °· Do not apply powder or lotion to the site. °· Do not submerge the affected site in water for 3 to 5 days. °· Inspect the site at least twice daily. °· Do not flex or bend the affected arm for 24 hours. °· No lifting over 5 pounds (2.3 kg) for 5 days after your procedure. °· Do not drive home if you are discharged the same day of the procedure. Have someone else drive you. °· You may drive 24 hours after the procedure unless otherwise instructed by your caregiver. °· Do not operate machinery or power tools for 24 hours. °· A responsible adult should be with you for the first 24 hours after you arrive home. °What to expect: °· Any bruising will usually fade within 1 to 2 weeks. °· Blood that collects in the tissue (hematoma) may be painful to the touch. It should usually decrease in size and tenderness within 1 to 2 weeks. °SEEK IMMEDIATE MEDICAL CARE IF: °· You have unusual pain at the radial site. °· You have redness, warmth, swelling, or pain at the radial site. °· You have drainage (other than a small amount of blood on the dressing). °· You have chills. °· You have a fever or persistent symptoms for more than 72 hours. °· You have a fever and your symptoms suddenly get worse. °· Your arm becomes pale, cool, tingly, or numb. °· You have heavy bleeding from the site. Hold pressure on the site. CALL 911 °Document Released: 05/31/2010 Document  Revised: 07/21/2011 Document Reviewed: 05/31/2010 °Exit[employer]® Patient Information ©2015 [employer]. This information is not intended to replace advice given to you by your health care provider. Make sure you discuss any questions you have with your health care provider. ° °

## 2014-05-30 NOTE — Interval H&P Note (Signed)
History and Physical Interval Note:  05/30/2014 1:04 PM  [name]  has presented today for surgery, with the diagnosis of hf and chest pain The various methods of treatment have been discussed with the patient and family. After consideration of risks, benefits and other options for treatment, the patient has consented to  Procedure(s): LEFT AND RIGHT HEART CATHETERIZATION WITH CORONARY ANGIOGRAM (N/A) and possible angioplasty as a surgical intervention .  The patient's history has been reviewed, patient examined, no change in status, stable for surgery.  I have reviewed the patient's chart and labs.  Questions were answered to the patient's satisfaction.    Cath Lab Visit (complete for each Cath Lab visit)  Clinical Evaluation Leading to the Procedure:   ACS: No.  Non-ACS:    Anginal Classification: CCS III  Anti-ischemic medical therapy: Minimal Therapy (1 class of medications)  Non-Invasive Test Results: No non-invasive testing performed  Prior CABG: No previous CABG         Glori Bickers

## 2014-05-31 ENCOUNTER — Telehealth: Payer: Self-pay | Admitting: Cardiovascular Disease

## 2014-05-31 NOTE — Telephone Encounter (Signed)
New Msg         Pt returning call from yesterday.    Please call back.

## 2014-05-31 NOTE — Telephone Encounter (Signed)
Called patient with labs results. Per Dr. Johnsie Cancel, labs are normal and no CHF, except for BS is high and he needs to follow up with his primary doctor. Also informed patient that chest xray was normal with no CHF. Patient verbalized understanding.

## 2014-06-02 ENCOUNTER — Institutional Professional Consult (permissible substitution): Payer: Self-pay | Admitting: Pulmonary Disease

## 2014-06-13 ENCOUNTER — Encounter: Payer: Self-pay | Admitting: Pulmonary Disease

## 2014-06-13 ENCOUNTER — Ambulatory Visit (INDEPENDENT_AMBULATORY_CARE_PROVIDER_SITE_OTHER): Payer: Medicare Other | Admitting: Pulmonary Disease

## 2014-06-13 DIAGNOSIS — R0609 Other forms of dyspnea: Secondary | ICD-10-CM

## 2014-06-13 DIAGNOSIS — R0600 Dyspnea, unspecified: Secondary | ICD-10-CM

## 2014-06-13 NOTE — Assessment & Plan Note (Signed)
The patient has severe dyspnea on exertion that I suspect is multifactorial. He is morbidly obese, and has gained over 50 pounds in the last one year. He was very active, but the last few months has been varies sedentary with obvious deconditioning. He does have a long history of smoking, and will need pulmonary function studies to assess for underlying obstructive airways disease. He has a history of rheumatoid arthritis, but there is nothing by exam or CT scan to suggest pulmonary involvement. He has been on methotrexate for years, and does not have any significant side effects that are obvious. I have encouraged him to work aggressively on weight loss and conditioning, and will do PFTs and see him back on the same day.

## 2014-06-13 NOTE — Progress Notes (Signed)
   Subjective:    Patient ID: Joshua Lara, male    DOB: 18-May-1945, 69 y.o.   MRN: [id number]  HPI The patient is a 69 year old male who I've been asked to see for dyspnea on exertion. He has a history of getting short of breath with heavy exertion in the past, but starting in December of last year this began to become more severe. He is now to the point that he will get winded just walking through his house, and has severe shortness of breath bending over. It should be noted that he has gained over 50 pounds in the last one year, and has significant centripetal obesity. He has a history of smoking 2 packs per day for 30 years, but has not smoked since 1992. He has never had pulmonary function studies, but did have a recent CT chest that did not show any pulmonary embolus, but did show some bibasilar atelectasis. He is having an echocardiogram that showed a normal ejection fraction, and there was nothing to suggest pulmonary hypertension. He then underwent left and right heart catheterization last week, with only mild coronary disease and normal right heart pressures. The patient feels that he cannot get a deep breath most of the time. He has a mild dry cough but is not congested and does not bring up mucus. He has mild intermittent lower extremity edema.   Review of Systems  Constitutional: Negative for fever and unexpected weight change.  HENT: Positive for postnasal drip, sore throat and trouble swallowing. Negative for congestion, dental problem, ear pain, nosebleeds, rhinorrhea, sinus pressure and sneezing.   Eyes: Negative for redness and itching.  Respiratory: Positive for cough and shortness of breath. Negative for chest tightness and wheezing.   Cardiovascular: Positive for leg swelling. Negative for palpitations.  Gastrointestinal: Negative for nausea and vomiting.  Genitourinary: Negative for dysuria.  Musculoskeletal: Negative for joint swelling.  Skin: Negative for rash.    Neurological: Negative for headaches.  Hematological: Does not bruise/bleed easily.  Psychiatric/Behavioral: Negative for dysphoric mood. The patient is not nervous/anxious.        Objective:   Physical Exam Constitutional:  Obese male, no acute distress  HENT:  Nares patent without discharge  Oropharynx without exudate, palate and uvula are thick and elongated  Eyes:  Perrla, eomi, no scleral icterus  Neck:  No JVD, no TMG  Cardiovascular:  Normal rate, regular rhythm, no rubs or gallops.  No murmurs        Intact distal pulses  Pulmonary :  Normal breath sounds, no stridor or respiratory distress   No rales, rhonchi, or wheezing  Abdominal:  Soft, nondistended, bowel sounds present.  No tenderness noted.   Musculoskeletal:  No lower extremity edema noted.  Lymph Nodes:  No cervical lymphadenopathy noted  Skin:  No cyanosis noted  Neurologic:  Alert, appropriate, moves all 4 extremities without obvious deficit.         Assessment & Plan:

## 2014-06-13 NOTE — Patient Instructions (Signed)
Will schedule for breathing studies, and see you back the same day to review. Work on weight loss and conditioning.

## 2014-06-22 DIAGNOSIS — E790 Hyperuricemia without signs of inflammatory arthritis and tophaceous disease: Secondary | ICD-10-CM | POA: Diagnosis not present

## 2014-06-22 DIAGNOSIS — M0609 Rheumatoid arthritis without rheumatoid factor, multiple sites: Secondary | ICD-10-CM | POA: Diagnosis not present

## 2014-06-22 DIAGNOSIS — I8311 Varicose veins of right lower extremity with inflammation: Secondary | ICD-10-CM | POA: Diagnosis not present

## 2014-06-22 DIAGNOSIS — K121 Other forms of stomatitis: Secondary | ICD-10-CM | POA: Diagnosis not present

## 2014-07-03 ENCOUNTER — Ambulatory Visit (INDEPENDENT_AMBULATORY_CARE_PROVIDER_SITE_OTHER): Payer: Medicare Other | Admitting: Pulmonary Disease

## 2014-07-03 ENCOUNTER — Ambulatory Visit (HOSPITAL_COMMUNITY)
Admission: RE | Admit: 2014-07-03 | Discharge: 2014-07-03 | Disposition: A | Discharge status: Home / Self Care | Payer: Medicare Other | Source: Ambulatory Visit | Attending: Pulmonary Disease | Admitting: Pulmonary Disease

## 2014-07-03 ENCOUNTER — Encounter: Payer: Self-pay | Admitting: Pulmonary Disease

## 2014-07-03 VITALS — BP 132/68 | HR 114 | Temp 97.1°F | Ht 70.0 in | Wt 266.0 lb

## 2014-07-03 DIAGNOSIS — R0600 Dyspnea, unspecified: Secondary | ICD-10-CM

## 2014-07-03 DIAGNOSIS — R0609 Other forms of dyspnea: Secondary | ICD-10-CM

## 2014-07-03 DIAGNOSIS — I2510 Atherosclerotic heart disease of native coronary artery without angina pectoris: Secondary | ICD-10-CM | POA: Diagnosis not present

## 2014-07-03 LAB — PULMONARY FUNCTION TEST
DL/VA % PRED: 115 %
DL/VA: 4.48 ml/min/mmHg/L
DLCO UNC: 20.93 ml/min/mmHg
DLCO unc % pred: 103 %
FEF 25-75 Post: 2.4 L/sec
FEF 25-75 Pre: 2.1 L/sec
FEF2575-%CHANGE-POST: 13 %
FEF2575-%PRED-PRE: 116 %
FEF2575-%Pred-Post: 133 %
FEV1-%Change-Post: 3 %
FEV1-%PRED-PRE: 99 %
FEV1-%Pred-Post: 102 %
FEV1-POST: 2.33 L
FEV1-Pre: 2.26 L
FEV1FVC-%CHANGE-POST: -2 %
FEV1FVC-%Pred-Pre: 106 %
FEV6-%CHANGE-POST: 5 %
FEV6-%Pred-Post: 104 %
FEV6-%Pred-Pre: 99 %
FEV6-Post: 3.01 L
FEV6-Pre: 2.86 L
FEV6FVC-%Change-Post: 0 %
FEV6FVC-%Pred-Post: 107 %
FEV6FVC-%Pred-Pre: 107 %
FVC-%Change-Post: 5 %
FVC-%Pred-Post: 97 %
FVC-%Pred-Pre: 92 %
FVC-PRE: 2.86 L
FVC-Post: 3.03 L
POST FEV1/FVC RATIO: 77 %
PRE FEV1/FVC RATIO: 79 %
Post FEV6/FVC ratio: 100 %
Pre FEV6/FVC Ratio: 100 %
RV % pred: 113 %
RV: 2.17 L
TLC % PRED: 96 %
TLC: 5.01 L

## 2014-07-03 MED ORDER — ALBUTEROL SULFATE (2.5 MG/3ML) 0.083% IN NEBU
2.5000 mg | INHALATION_SOLUTION | Freq: Once | RESPIRATORY_TRACT | Status: AC
  Administered 2014-07-03: 2.5 mg via RESPIRATORY_TRACT

## 2014-07-03 NOTE — Patient Instructions (Signed)
Your breathing studies are essentially normal.  I cannot find a pulmonary issue for your shortness of breath Would work aggressively on weight loss and a conditioning program followup with me as needed.

## 2014-07-03 NOTE — Progress Notes (Signed)
   Subjective:    Patient ID: Joshua Lara, male    DOB: 1945-10-24, 69 y.o.   MRN: [id number]  HPI The patient comes in today for follow-up of his recent pulmonary function studies, done as part of a workup for dyspnea on exertion. He was found to have no airflow obstruction, no restriction, and a normal diffusion capacity. He did have minimal air-trapping noted. I have reviewed his studies with him in detail, and answered all of his questions.   Review of Systems  Constitutional: Negative for fever and unexpected weight change.  HENT: Positive for congestion and postnasal drip. Negative for dental problem, ear pain, nosebleeds, rhinorrhea, sinus pressure, sneezing, sore throat and trouble swallowing.   Eyes: Negative for redness and itching.  Respiratory: Positive for chest tightness and shortness of breath. Negative for cough and wheezing.   Cardiovascular: Positive for leg swelling. Negative for palpitations.  Gastrointestinal: Negative for nausea and vomiting.  Genitourinary: Negative for dysuria.  Musculoskeletal: Negative for joint swelling.  Skin: Negative for rash.  Neurological: Negative for headaches.  Hematological: Does not bruise/bleed easily.  Psychiatric/Behavioral: Negative for dysphoric mood. The patient is not nervous/anxious.        Objective:   Physical Exam Morbidly obese male in no acute distress Nose without purulence or discharge noted Neck without lymphadenopathy or thyromegaly Lower extremities with mild edema, no cyanosis Alert and oriented, moves all 4 extremities.       Assessment & Plan:

## 2014-07-03 NOTE — Assessment & Plan Note (Signed)
The patient has significant dyspnea on exertion that I think is related to his morbid obesity and also deconditioning/debility. His PFTs today showed no airflow obstruction, no restriction, and a normal diffusion capacity. He has minimal air-trapping, but this certainly is not the cause of his symptoms. He has had a CT and she has with no evidence for pulmonary emboli. At this point, I have recommended that he work aggressively on weight loss, and also some type of conditioning program. He used to do water aerobics with great success, and I've asked him to get back to this.

## 2014-07-12 ENCOUNTER — Encounter: Payer: Self-pay | Admitting: *Deleted

## 2014-07-12 ENCOUNTER — Telehealth: Payer: Self-pay | Admitting: Pulmonary Disease

## 2014-07-12 NOTE — Telephone Encounter (Signed)
I never took him out of work, so how can I release him to go to work. If he needs some kind of note, can just put on a piece of paper that I have seen him for breathing issues, and feel his "breathing is stable"

## 2014-07-12 NOTE — Telephone Encounter (Signed)
Spoke with pt. States that he needs letter faxed to his employer. Letter needs to state that it is okay for him to return to work. 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  Hardesty- please advise. Thanks.

## 2014-07-12 NOTE — Telephone Encounter (Signed)
Letter has been done and faxed to the number given by the pt.  i have called and lmom to make the pt aware.  Nothing further is needed.

## 2014-07-25 ENCOUNTER — Ambulatory Visit: Payer: Self-pay | Admitting: Pulmonary Disease

## 2014-07-26 ENCOUNTER — Ambulatory Visit: Payer: Self-pay | Admitting: Pulmonary Disease

## 2014-07-27 DIAGNOSIS — I10 Essential (primary) hypertension: Secondary | ICD-10-CM | POA: Diagnosis not present

## 2014-07-27 DIAGNOSIS — E119 Type 2 diabetes mellitus without complications: Secondary | ICD-10-CM | POA: Diagnosis not present

## 2014-07-27 DIAGNOSIS — E039 Hypothyroidism, unspecified: Secondary | ICD-10-CM | POA: Diagnosis not present

## 2014-07-27 DIAGNOSIS — I2510 Atherosclerotic heart disease of native coronary artery without angina pectoris: Secondary | ICD-10-CM | POA: Diagnosis not present

## 2014-07-27 DIAGNOSIS — Z79899 Other long term (current) drug therapy: Secondary | ICD-10-CM | POA: Diagnosis not present

## 2014-07-27 DIAGNOSIS — G4733 Obstructive sleep apnea (adult) (pediatric): Secondary | ICD-10-CM | POA: Diagnosis not present

## 2014-09-21 DIAGNOSIS — M0609 Rheumatoid arthritis without rheumatoid factor, multiple sites: Secondary | ICD-10-CM | POA: Diagnosis not present

## 2014-09-21 DIAGNOSIS — E790 Hyperuricemia without signs of inflammatory arthritis and tophaceous disease: Secondary | ICD-10-CM | POA: Diagnosis not present

## 2014-09-21 DIAGNOSIS — R682 Dry mouth, unspecified: Secondary | ICD-10-CM | POA: Diagnosis not present

## 2014-09-21 DIAGNOSIS — I8311 Varicose veins of right lower extremity with inflammation: Secondary | ICD-10-CM | POA: Diagnosis not present

## 2014-09-22 DIAGNOSIS — L03031 Cellulitis of right toe: Secondary | ICD-10-CM | POA: Diagnosis not present

## 2014-09-22 DIAGNOSIS — E119 Type 2 diabetes mellitus without complications: Secondary | ICD-10-CM | POA: Diagnosis not present

## 2014-09-26 DIAGNOSIS — E1165 Type 2 diabetes mellitus with hyperglycemia: Secondary | ICD-10-CM | POA: Diagnosis not present

## 2014-09-26 DIAGNOSIS — Z1389 Encounter for screening for other disorder: Secondary | ICD-10-CM | POA: Diagnosis not present

## 2014-09-26 DIAGNOSIS — J020 Streptococcal pharyngitis: Secondary | ICD-10-CM | POA: Diagnosis not present

## 2014-10-17 DIAGNOSIS — E1140 Type 2 diabetes mellitus with diabetic neuropathy, unspecified: Secondary | ICD-10-CM | POA: Diagnosis not present

## 2014-10-17 DIAGNOSIS — L03032 Cellulitis of left toe: Secondary | ICD-10-CM | POA: Diagnosis not present

## 2014-10-26 DIAGNOSIS — E1165 Type 2 diabetes mellitus with hyperglycemia: Secondary | ICD-10-CM | POA: Diagnosis not present

## 2014-10-26 DIAGNOSIS — Z6836 Body mass index (BMI) 36.0-36.9, adult: Secondary | ICD-10-CM | POA: Diagnosis not present

## 2014-10-26 DIAGNOSIS — E782 Mixed hyperlipidemia: Secondary | ICD-10-CM | POA: Diagnosis not present

## 2014-10-26 DIAGNOSIS — Z1389 Encounter for screening for other disorder: Secondary | ICD-10-CM | POA: Diagnosis not present

## 2014-10-26 DIAGNOSIS — R5383 Other fatigue: Secondary | ICD-10-CM | POA: Diagnosis not present

## 2014-10-26 DIAGNOSIS — I10 Essential (primary) hypertension: Secondary | ICD-10-CM | POA: Diagnosis not present

## 2014-10-26 DIAGNOSIS — E039 Hypothyroidism, unspecified: Secondary | ICD-10-CM | POA: Diagnosis not present

## 2014-10-26 DIAGNOSIS — M109 Gout, unspecified: Secondary | ICD-10-CM | POA: Diagnosis not present

## 2014-10-26 DIAGNOSIS — I2510 Atherosclerotic heart disease of native coronary artery without angina pectoris: Secondary | ICD-10-CM | POA: Diagnosis not present

## 2014-10-26 DIAGNOSIS — G4733 Obstructive sleep apnea (adult) (pediatric): Secondary | ICD-10-CM | POA: Diagnosis not present

## 2014-12-26 DIAGNOSIS — H53002 Unspecified amblyopia, left eye: Secondary | ICD-10-CM | POA: Diagnosis not present

## 2014-12-26 DIAGNOSIS — H4011X3 Primary open-angle glaucoma, severe stage: Secondary | ICD-10-CM | POA: Diagnosis not present

## 2014-12-26 DIAGNOSIS — H43811 Vitreous degeneration, right eye: Secondary | ICD-10-CM | POA: Diagnosis not present

## 2014-12-26 DIAGNOSIS — E119 Type 2 diabetes mellitus without complications: Secondary | ICD-10-CM | POA: Diagnosis not present

## 2014-12-26 DIAGNOSIS — H5010 Unspecified exotropia: Secondary | ICD-10-CM | POA: Diagnosis not present

## 2014-12-26 DIAGNOSIS — Z961 Presence of intraocular lens: Secondary | ICD-10-CM | POA: Diagnosis not present

## 2014-12-26 DIAGNOSIS — H2511 Age-related nuclear cataract, right eye: Secondary | ICD-10-CM | POA: Diagnosis not present

## 2014-12-26 DIAGNOSIS — H4011X4 Primary open-angle glaucoma, indeterminate stage: Secondary | ICD-10-CM | POA: Diagnosis not present

## 2015-01-25 DIAGNOSIS — I10 Essential (primary) hypertension: Secondary | ICD-10-CM | POA: Diagnosis not present

## 2015-01-25 DIAGNOSIS — M109 Gout, unspecified: Secondary | ICD-10-CM | POA: Diagnosis not present

## 2015-01-25 DIAGNOSIS — E782 Mixed hyperlipidemia: Secondary | ICD-10-CM | POA: Diagnosis not present

## 2015-01-25 DIAGNOSIS — E1140 Type 2 diabetes mellitus with diabetic neuropathy, unspecified: Secondary | ICD-10-CM | POA: Diagnosis not present

## 2015-01-25 DIAGNOSIS — G4733 Obstructive sleep apnea (adult) (pediatric): Secondary | ICD-10-CM | POA: Diagnosis not present

## 2015-01-25 DIAGNOSIS — Z9181 History of falling: Secondary | ICD-10-CM | POA: Diagnosis not present

## 2015-01-25 DIAGNOSIS — E119 Type 2 diabetes mellitus without complications: Secondary | ICD-10-CM | POA: Diagnosis not present

## 2015-01-25 DIAGNOSIS — Z79899 Other long term (current) drug therapy: Secondary | ICD-10-CM | POA: Diagnosis not present

## 2015-01-25 DIAGNOSIS — I2510 Atherosclerotic heart disease of native coronary artery without angina pectoris: Secondary | ICD-10-CM | POA: Diagnosis not present

## 2015-01-25 DIAGNOSIS — Z1389 Encounter for screening for other disorder: Secondary | ICD-10-CM | POA: Diagnosis not present

## 2015-01-25 DIAGNOSIS — E039 Hypothyroidism, unspecified: Secondary | ICD-10-CM | POA: Diagnosis not present

## 2015-02-20 DIAGNOSIS — M25562 Pain in left knee: Secondary | ICD-10-CM | POA: Diagnosis not present

## 2015-02-20 DIAGNOSIS — M25561 Pain in right knee: Secondary | ICD-10-CM | POA: Diagnosis not present

## 2015-02-20 DIAGNOSIS — M545 Low back pain: Secondary | ICD-10-CM | POA: Diagnosis not present

## 2015-02-22 DIAGNOSIS — M79604 Pain in right leg: Secondary | ICD-10-CM | POA: Diagnosis not present

## 2015-02-22 DIAGNOSIS — M546 Pain in thoracic spine: Secondary | ICD-10-CM | POA: Diagnosis not present

## 2015-02-22 DIAGNOSIS — M545 Low back pain: Secondary | ICD-10-CM | POA: Diagnosis not present

## 2015-02-22 DIAGNOSIS — M79605 Pain in left leg: Secondary | ICD-10-CM | POA: Diagnosis not present

## 2015-02-26 DIAGNOSIS — M79605 Pain in left leg: Secondary | ICD-10-CM | POA: Diagnosis not present

## 2015-02-26 DIAGNOSIS — M545 Low back pain: Secondary | ICD-10-CM | POA: Diagnosis not present

## 2015-02-26 DIAGNOSIS — M79604 Pain in right leg: Secondary | ICD-10-CM | POA: Diagnosis not present

## 2015-02-26 DIAGNOSIS — M546 Pain in thoracic spine: Secondary | ICD-10-CM | POA: Diagnosis not present

## 2015-02-28 DIAGNOSIS — M79604 Pain in right leg: Secondary | ICD-10-CM | POA: Diagnosis not present

## 2015-02-28 DIAGNOSIS — M546 Pain in thoracic spine: Secondary | ICD-10-CM | POA: Diagnosis not present

## 2015-02-28 DIAGNOSIS — M79605 Pain in left leg: Secondary | ICD-10-CM | POA: Diagnosis not present

## 2015-02-28 DIAGNOSIS — M545 Low back pain: Secondary | ICD-10-CM | POA: Diagnosis not present

## 2015-03-01 DIAGNOSIS — M545 Low back pain: Secondary | ICD-10-CM | POA: Diagnosis not present

## 2015-03-01 DIAGNOSIS — M79605 Pain in left leg: Secondary | ICD-10-CM | POA: Diagnosis not present

## 2015-03-01 DIAGNOSIS — M79604 Pain in right leg: Secondary | ICD-10-CM | POA: Diagnosis not present

## 2015-03-01 DIAGNOSIS — M546 Pain in thoracic spine: Secondary | ICD-10-CM | POA: Diagnosis not present

## 2015-03-05 DIAGNOSIS — M79605 Pain in left leg: Secondary | ICD-10-CM | POA: Diagnosis not present

## 2015-03-05 DIAGNOSIS — M545 Low back pain: Secondary | ICD-10-CM | POA: Diagnosis not present

## 2015-03-05 DIAGNOSIS — M546 Pain in thoracic spine: Secondary | ICD-10-CM | POA: Diagnosis not present

## 2015-03-05 DIAGNOSIS — M79604 Pain in right leg: Secondary | ICD-10-CM | POA: Diagnosis not present

## 2015-03-07 DIAGNOSIS — M79604 Pain in right leg: Secondary | ICD-10-CM | POA: Diagnosis not present

## 2015-03-07 DIAGNOSIS — M79605 Pain in left leg: Secondary | ICD-10-CM | POA: Diagnosis not present

## 2015-03-07 DIAGNOSIS — M546 Pain in thoracic spine: Secondary | ICD-10-CM | POA: Diagnosis not present

## 2015-03-07 DIAGNOSIS — M545 Low back pain: Secondary | ICD-10-CM | POA: Diagnosis not present

## 2015-03-09 DIAGNOSIS — M545 Low back pain: Secondary | ICD-10-CM | POA: Diagnosis not present

## 2015-03-09 DIAGNOSIS — M79604 Pain in right leg: Secondary | ICD-10-CM | POA: Diagnosis not present

## 2015-03-09 DIAGNOSIS — M79605 Pain in left leg: Secondary | ICD-10-CM | POA: Diagnosis not present

## 2015-03-09 DIAGNOSIS — M546 Pain in thoracic spine: Secondary | ICD-10-CM | POA: Diagnosis not present

## 2015-03-12 DIAGNOSIS — M546 Pain in thoracic spine: Secondary | ICD-10-CM | POA: Diagnosis not present

## 2015-03-12 DIAGNOSIS — M545 Low back pain: Secondary | ICD-10-CM | POA: Diagnosis not present

## 2015-03-12 DIAGNOSIS — M79604 Pain in right leg: Secondary | ICD-10-CM | POA: Diagnosis not present

## 2015-03-12 DIAGNOSIS — M79605 Pain in left leg: Secondary | ICD-10-CM | POA: Diagnosis not present

## 2015-03-14 DIAGNOSIS — M79605 Pain in left leg: Secondary | ICD-10-CM | POA: Diagnosis not present

## 2015-03-14 DIAGNOSIS — M546 Pain in thoracic spine: Secondary | ICD-10-CM | POA: Diagnosis not present

## 2015-03-14 DIAGNOSIS — M79604 Pain in right leg: Secondary | ICD-10-CM | POA: Diagnosis not present

## 2015-03-14 DIAGNOSIS — M545 Low back pain: Secondary | ICD-10-CM | POA: Diagnosis not present

## 2015-03-16 DIAGNOSIS — M546 Pain in thoracic spine: Secondary | ICD-10-CM | POA: Diagnosis not present

## 2015-03-16 DIAGNOSIS — M79604 Pain in right leg: Secondary | ICD-10-CM | POA: Diagnosis not present

## 2015-03-16 DIAGNOSIS — M79605 Pain in left leg: Secondary | ICD-10-CM | POA: Diagnosis not present

## 2015-03-16 DIAGNOSIS — M545 Low back pain: Secondary | ICD-10-CM | POA: Diagnosis not present

## 2015-03-19 DIAGNOSIS — M545 Low back pain: Secondary | ICD-10-CM | POA: Diagnosis not present

## 2015-03-19 DIAGNOSIS — M79604 Pain in right leg: Secondary | ICD-10-CM | POA: Diagnosis not present

## 2015-03-19 DIAGNOSIS — M546 Pain in thoracic spine: Secondary | ICD-10-CM | POA: Diagnosis not present

## 2015-03-19 DIAGNOSIS — M79605 Pain in left leg: Secondary | ICD-10-CM | POA: Diagnosis not present

## 2015-03-21 DIAGNOSIS — M545 Low back pain: Secondary | ICD-10-CM | POA: Diagnosis not present

## 2015-03-21 DIAGNOSIS — M79604 Pain in right leg: Secondary | ICD-10-CM | POA: Diagnosis not present

## 2015-03-21 DIAGNOSIS — M546 Pain in thoracic spine: Secondary | ICD-10-CM | POA: Diagnosis not present

## 2015-03-21 DIAGNOSIS — M79605 Pain in left leg: Secondary | ICD-10-CM | POA: Diagnosis not present

## 2015-03-23 DIAGNOSIS — M79604 Pain in right leg: Secondary | ICD-10-CM | POA: Diagnosis not present

## 2015-03-23 DIAGNOSIS — M79605 Pain in left leg: Secondary | ICD-10-CM | POA: Diagnosis not present

## 2015-03-23 DIAGNOSIS — M545 Low back pain: Secondary | ICD-10-CM | POA: Diagnosis not present

## 2015-03-23 DIAGNOSIS — M546 Pain in thoracic spine: Secondary | ICD-10-CM | POA: Diagnosis not present

## 2015-03-26 DIAGNOSIS — M545 Low back pain: Secondary | ICD-10-CM | POA: Diagnosis not present

## 2015-03-26 DIAGNOSIS — M546 Pain in thoracic spine: Secondary | ICD-10-CM | POA: Diagnosis not present

## 2015-03-26 DIAGNOSIS — M79604 Pain in right leg: Secondary | ICD-10-CM | POA: Diagnosis not present

## 2015-03-26 DIAGNOSIS — M79605 Pain in left leg: Secondary | ICD-10-CM | POA: Diagnosis not present

## 2015-03-28 DIAGNOSIS — Z961 Presence of intraocular lens: Secondary | ICD-10-CM | POA: Diagnosis not present

## 2015-03-28 DIAGNOSIS — H401134 Primary open-angle glaucoma, bilateral, indeterminate stage: Secondary | ICD-10-CM | POA: Diagnosis not present

## 2015-03-28 DIAGNOSIS — M546 Pain in thoracic spine: Secondary | ICD-10-CM | POA: Diagnosis not present

## 2015-03-28 DIAGNOSIS — M545 Low back pain: Secondary | ICD-10-CM | POA: Diagnosis not present

## 2015-03-28 DIAGNOSIS — M79605 Pain in left leg: Secondary | ICD-10-CM | POA: Diagnosis not present

## 2015-03-28 DIAGNOSIS — H26492 Other secondary cataract, left eye: Secondary | ICD-10-CM | POA: Diagnosis not present

## 2015-03-28 DIAGNOSIS — M79604 Pain in right leg: Secondary | ICD-10-CM | POA: Diagnosis not present

## 2015-03-30 DIAGNOSIS — M545 Low back pain: Secondary | ICD-10-CM | POA: Diagnosis not present

## 2015-03-30 DIAGNOSIS — M79605 Pain in left leg: Secondary | ICD-10-CM | POA: Diagnosis not present

## 2015-03-30 DIAGNOSIS — M546 Pain in thoracic spine: Secondary | ICD-10-CM | POA: Diagnosis not present

## 2015-03-30 DIAGNOSIS — M79604 Pain in right leg: Secondary | ICD-10-CM | POA: Diagnosis not present

## 2015-04-02 DIAGNOSIS — M545 Low back pain: Secondary | ICD-10-CM | POA: Diagnosis not present

## 2015-04-02 DIAGNOSIS — M546 Pain in thoracic spine: Secondary | ICD-10-CM | POA: Diagnosis not present

## 2015-04-02 DIAGNOSIS — M79605 Pain in left leg: Secondary | ICD-10-CM | POA: Diagnosis not present

## 2015-04-02 DIAGNOSIS — M79604 Pain in right leg: Secondary | ICD-10-CM | POA: Diagnosis not present

## 2015-04-03 DIAGNOSIS — M545 Low back pain: Secondary | ICD-10-CM | POA: Diagnosis not present

## 2015-04-03 DIAGNOSIS — M25561 Pain in right knee: Secondary | ICD-10-CM | POA: Diagnosis not present

## 2015-04-03 DIAGNOSIS — M25562 Pain in left knee: Secondary | ICD-10-CM | POA: Diagnosis not present

## 2015-04-04 DIAGNOSIS — Z23 Encounter for immunization: Secondary | ICD-10-CM | POA: Diagnosis not present

## 2015-04-04 DIAGNOSIS — M545 Low back pain: Secondary | ICD-10-CM | POA: Diagnosis not present

## 2015-04-04 DIAGNOSIS — M546 Pain in thoracic spine: Secondary | ICD-10-CM | POA: Diagnosis not present

## 2015-04-04 DIAGNOSIS — M79604 Pain in right leg: Secondary | ICD-10-CM | POA: Diagnosis not present

## 2015-04-04 DIAGNOSIS — M79605 Pain in left leg: Secondary | ICD-10-CM | POA: Diagnosis not present

## 2015-04-09 DIAGNOSIS — M545 Low back pain: Secondary | ICD-10-CM | POA: Diagnosis not present

## 2015-04-09 DIAGNOSIS — M79604 Pain in right leg: Secondary | ICD-10-CM | POA: Diagnosis not present

## 2015-04-09 DIAGNOSIS — M546 Pain in thoracic spine: Secondary | ICD-10-CM | POA: Diagnosis not present

## 2015-04-09 DIAGNOSIS — M79605 Pain in left leg: Secondary | ICD-10-CM | POA: Diagnosis not present

## 2015-04-11 DIAGNOSIS — M79605 Pain in left leg: Secondary | ICD-10-CM | POA: Diagnosis not present

## 2015-04-11 DIAGNOSIS — M546 Pain in thoracic spine: Secondary | ICD-10-CM | POA: Diagnosis not present

## 2015-04-11 DIAGNOSIS — M545 Low back pain: Secondary | ICD-10-CM | POA: Diagnosis not present

## 2015-04-11 DIAGNOSIS — M79604 Pain in right leg: Secondary | ICD-10-CM | POA: Diagnosis not present

## 2015-04-13 DIAGNOSIS — M79605 Pain in left leg: Secondary | ICD-10-CM | POA: Diagnosis not present

## 2015-04-13 DIAGNOSIS — M546 Pain in thoracic spine: Secondary | ICD-10-CM | POA: Diagnosis not present

## 2015-04-13 DIAGNOSIS — M79604 Pain in right leg: Secondary | ICD-10-CM | POA: Diagnosis not present

## 2015-04-13 DIAGNOSIS — M545 Low back pain: Secondary | ICD-10-CM | POA: Diagnosis not present

## 2015-04-16 DIAGNOSIS — M545 Low back pain: Secondary | ICD-10-CM | POA: Diagnosis not present

## 2015-04-16 DIAGNOSIS — M79604 Pain in right leg: Secondary | ICD-10-CM | POA: Diagnosis not present

## 2015-04-16 DIAGNOSIS — M546 Pain in thoracic spine: Secondary | ICD-10-CM | POA: Diagnosis not present

## 2015-04-16 DIAGNOSIS — M79605 Pain in left leg: Secondary | ICD-10-CM | POA: Diagnosis not present

## 2015-04-17 ENCOUNTER — Other Ambulatory Visit: Payer: Self-pay | Admitting: Orthopaedic Surgery

## 2015-04-17 DIAGNOSIS — M545 Low back pain: Secondary | ICD-10-CM

## 2015-04-18 DIAGNOSIS — E119 Type 2 diabetes mellitus without complications: Secondary | ICD-10-CM | POA: Diagnosis not present

## 2015-04-18 DIAGNOSIS — L600 Ingrowing nail: Secondary | ICD-10-CM | POA: Diagnosis not present

## 2015-04-20 DIAGNOSIS — M545 Low back pain: Secondary | ICD-10-CM | POA: Diagnosis not present

## 2015-04-20 DIAGNOSIS — M546 Pain in thoracic spine: Secondary | ICD-10-CM | POA: Diagnosis not present

## 2015-04-20 DIAGNOSIS — M79604 Pain in right leg: Secondary | ICD-10-CM | POA: Diagnosis not present

## 2015-04-20 DIAGNOSIS — M79605 Pain in left leg: Secondary | ICD-10-CM | POA: Diagnosis not present

## 2015-04-23 DIAGNOSIS — M545 Low back pain: Secondary | ICD-10-CM | POA: Diagnosis not present

## 2015-04-23 DIAGNOSIS — M79605 Pain in left leg: Secondary | ICD-10-CM | POA: Diagnosis not present

## 2015-04-23 DIAGNOSIS — M546 Pain in thoracic spine: Secondary | ICD-10-CM | POA: Diagnosis not present

## 2015-04-23 DIAGNOSIS — M79604 Pain in right leg: Secondary | ICD-10-CM | POA: Diagnosis not present

## 2015-04-25 DIAGNOSIS — M546 Pain in thoracic spine: Secondary | ICD-10-CM | POA: Diagnosis not present

## 2015-04-25 DIAGNOSIS — M79605 Pain in left leg: Secondary | ICD-10-CM | POA: Diagnosis not present

## 2015-04-25 DIAGNOSIS — M545 Low back pain: Secondary | ICD-10-CM | POA: Diagnosis not present

## 2015-04-25 DIAGNOSIS — M79604 Pain in right leg: Secondary | ICD-10-CM | POA: Diagnosis not present

## 2015-04-28 ENCOUNTER — Other Ambulatory Visit: Payer: Self-pay

## 2015-04-30 DIAGNOSIS — M545 Low back pain: Secondary | ICD-10-CM | POA: Diagnosis not present

## 2015-04-30 DIAGNOSIS — M546 Pain in thoracic spine: Secondary | ICD-10-CM | POA: Diagnosis not present

## 2015-04-30 DIAGNOSIS — M79604 Pain in right leg: Secondary | ICD-10-CM | POA: Diagnosis not present

## 2015-04-30 DIAGNOSIS — M79605 Pain in left leg: Secondary | ICD-10-CM | POA: Diagnosis not present

## 2015-05-02 DIAGNOSIS — M545 Low back pain: Secondary | ICD-10-CM | POA: Diagnosis not present

## 2015-05-02 DIAGNOSIS — M79605 Pain in left leg: Secondary | ICD-10-CM | POA: Diagnosis not present

## 2015-05-02 DIAGNOSIS — M79604 Pain in right leg: Secondary | ICD-10-CM | POA: Diagnosis not present

## 2015-05-02 DIAGNOSIS — M546 Pain in thoracic spine: Secondary | ICD-10-CM | POA: Diagnosis not present

## 2015-05-04 DIAGNOSIS — M545 Low back pain: Secondary | ICD-10-CM | POA: Diagnosis not present

## 2015-05-04 DIAGNOSIS — M79605 Pain in left leg: Secondary | ICD-10-CM | POA: Diagnosis not present

## 2015-05-04 DIAGNOSIS — M546 Pain in thoracic spine: Secondary | ICD-10-CM | POA: Diagnosis not present

## 2015-05-04 DIAGNOSIS — M79604 Pain in right leg: Secondary | ICD-10-CM | POA: Diagnosis not present

## 2015-05-08 ENCOUNTER — Ambulatory Visit
Admission: RE | Admit: 2015-05-08 | Discharge: 2015-05-08 | Disposition: A | Discharge status: Home / Self Care | Payer: Medicare Other | Source: Ambulatory Visit | Attending: Orthopaedic Surgery | Admitting: Orthopaedic Surgery

## 2015-05-08 DIAGNOSIS — M4806 Spinal stenosis, lumbar region: Secondary | ICD-10-CM | POA: Diagnosis not present

## 2015-05-08 DIAGNOSIS — M545 Low back pain: Secondary | ICD-10-CM

## 2015-05-09 DIAGNOSIS — M545 Low back pain: Secondary | ICD-10-CM | POA: Diagnosis not present

## 2015-05-09 DIAGNOSIS — L600 Ingrowing nail: Secondary | ICD-10-CM | POA: Diagnosis not present

## 2015-05-09 DIAGNOSIS — M546 Pain in thoracic spine: Secondary | ICD-10-CM | POA: Diagnosis not present

## 2015-05-09 DIAGNOSIS — M79605 Pain in left leg: Secondary | ICD-10-CM | POA: Diagnosis not present

## 2015-05-09 DIAGNOSIS — M79604 Pain in right leg: Secondary | ICD-10-CM | POA: Diagnosis not present

## 2015-05-10 DIAGNOSIS — M545 Low back pain: Secondary | ICD-10-CM | POA: Diagnosis not present

## 2015-05-10 DIAGNOSIS — M25562 Pain in left knee: Secondary | ICD-10-CM | POA: Diagnosis not present

## 2015-05-10 DIAGNOSIS — M25561 Pain in right knee: Secondary | ICD-10-CM | POA: Diagnosis not present

## 2015-05-11 DIAGNOSIS — M545 Low back pain: Secondary | ICD-10-CM | POA: Diagnosis not present

## 2015-05-11 DIAGNOSIS — M79605 Pain in left leg: Secondary | ICD-10-CM | POA: Diagnosis not present

## 2015-05-11 DIAGNOSIS — M79604 Pain in right leg: Secondary | ICD-10-CM | POA: Diagnosis not present

## 2015-05-11 DIAGNOSIS — M546 Pain in thoracic spine: Secondary | ICD-10-CM | POA: Diagnosis not present

## 2015-05-16 DIAGNOSIS — M545 Low back pain: Secondary | ICD-10-CM | POA: Diagnosis not present

## 2015-05-16 DIAGNOSIS — M79605 Pain in left leg: Secondary | ICD-10-CM | POA: Diagnosis not present

## 2015-05-16 DIAGNOSIS — M546 Pain in thoracic spine: Secondary | ICD-10-CM | POA: Diagnosis not present

## 2015-05-16 DIAGNOSIS — M79604 Pain in right leg: Secondary | ICD-10-CM | POA: Diagnosis not present

## 2015-05-18 DIAGNOSIS — M79605 Pain in left leg: Secondary | ICD-10-CM | POA: Diagnosis not present

## 2015-05-18 DIAGNOSIS — M79604 Pain in right leg: Secondary | ICD-10-CM | POA: Diagnosis not present

## 2015-05-18 DIAGNOSIS — M545 Low back pain: Secondary | ICD-10-CM | POA: Diagnosis not present

## 2015-05-18 DIAGNOSIS — M546 Pain in thoracic spine: Secondary | ICD-10-CM | POA: Diagnosis not present

## 2015-05-23 DIAGNOSIS — M545 Low back pain: Secondary | ICD-10-CM | POA: Diagnosis not present

## 2015-05-23 DIAGNOSIS — M79605 Pain in left leg: Secondary | ICD-10-CM | POA: Diagnosis not present

## 2015-05-23 DIAGNOSIS — M79604 Pain in right leg: Secondary | ICD-10-CM | POA: Diagnosis not present

## 2015-05-23 DIAGNOSIS — M546 Pain in thoracic spine: Secondary | ICD-10-CM | POA: Diagnosis not present

## 2015-05-25 DIAGNOSIS — M79605 Pain in left leg: Secondary | ICD-10-CM | POA: Diagnosis not present

## 2015-05-25 DIAGNOSIS — M546 Pain in thoracic spine: Secondary | ICD-10-CM | POA: Diagnosis not present

## 2015-05-25 DIAGNOSIS — M545 Low back pain: Secondary | ICD-10-CM | POA: Diagnosis not present

## 2015-05-25 DIAGNOSIS — M79604 Pain in right leg: Secondary | ICD-10-CM | POA: Diagnosis not present

## 2015-05-28 DIAGNOSIS — M79605 Pain in left leg: Secondary | ICD-10-CM | POA: Diagnosis not present

## 2015-05-28 DIAGNOSIS — M546 Pain in thoracic spine: Secondary | ICD-10-CM | POA: Diagnosis not present

## 2015-05-28 DIAGNOSIS — M545 Low back pain: Secondary | ICD-10-CM | POA: Diagnosis not present

## 2015-05-28 DIAGNOSIS — M79604 Pain in right leg: Secondary | ICD-10-CM | POA: Diagnosis not present

## 2015-05-29 DIAGNOSIS — N410 Acute prostatitis: Secondary | ICD-10-CM | POA: Diagnosis not present

## 2015-05-29 DIAGNOSIS — Z6839 Body mass index (BMI) 39.0-39.9, adult: Secondary | ICD-10-CM | POA: Diagnosis not present

## 2015-05-29 DIAGNOSIS — E039 Hypothyroidism, unspecified: Secondary | ICD-10-CM | POA: Diagnosis not present

## 2015-05-29 DIAGNOSIS — H6983 Other specified disorders of Eustachian tube, bilateral: Secondary | ICD-10-CM | POA: Diagnosis not present

## 2015-05-29 DIAGNOSIS — G4733 Obstructive sleep apnea (adult) (pediatric): Secondary | ICD-10-CM | POA: Diagnosis not present

## 2015-05-29 DIAGNOSIS — H73009 Acute myringitis, unspecified ear: Secondary | ICD-10-CM | POA: Diagnosis not present

## 2015-05-29 DIAGNOSIS — Z125 Encounter for screening for malignant neoplasm of prostate: Secondary | ICD-10-CM | POA: Diagnosis not present

## 2015-05-29 DIAGNOSIS — H9193 Unspecified hearing loss, bilateral: Secondary | ICD-10-CM | POA: Diagnosis not present

## 2015-05-29 DIAGNOSIS — H73003 Acute myringitis, bilateral: Secondary | ICD-10-CM | POA: Diagnosis not present

## 2015-05-29 DIAGNOSIS — I2510 Atherosclerotic heart disease of native coronary artery without angina pectoris: Secondary | ICD-10-CM | POA: Diagnosis not present

## 2015-05-29 DIAGNOSIS — L309 Dermatitis, unspecified: Secondary | ICD-10-CM | POA: Diagnosis not present

## 2015-05-29 DIAGNOSIS — E119 Type 2 diabetes mellitus without complications: Secondary | ICD-10-CM | POA: Diagnosis not present

## 2015-05-29 DIAGNOSIS — H409 Unspecified glaucoma: Secondary | ICD-10-CM | POA: Diagnosis not present

## 2015-05-30 DIAGNOSIS — M545 Low back pain: Secondary | ICD-10-CM | POA: Diagnosis not present

## 2015-05-30 DIAGNOSIS — M546 Pain in thoracic spine: Secondary | ICD-10-CM | POA: Diagnosis not present

## 2015-05-30 DIAGNOSIS — M79604 Pain in right leg: Secondary | ICD-10-CM | POA: Diagnosis not present

## 2015-05-30 DIAGNOSIS — M79605 Pain in left leg: Secondary | ICD-10-CM | POA: Diagnosis not present

## 2015-06-04 DIAGNOSIS — M545 Low back pain: Secondary | ICD-10-CM | POA: Diagnosis not present

## 2015-06-04 DIAGNOSIS — M79605 Pain in left leg: Secondary | ICD-10-CM | POA: Diagnosis not present

## 2015-06-04 DIAGNOSIS — M546 Pain in thoracic spine: Secondary | ICD-10-CM | POA: Diagnosis not present

## 2015-06-04 DIAGNOSIS — M79604 Pain in right leg: Secondary | ICD-10-CM | POA: Diagnosis not present

## 2015-06-06 DIAGNOSIS — M546 Pain in thoracic spine: Secondary | ICD-10-CM | POA: Diagnosis not present

## 2015-06-06 DIAGNOSIS — M545 Low back pain: Secondary | ICD-10-CM | POA: Diagnosis not present

## 2015-06-06 DIAGNOSIS — M79605 Pain in left leg: Secondary | ICD-10-CM | POA: Diagnosis not present

## 2015-06-06 DIAGNOSIS — M79604 Pain in right leg: Secondary | ICD-10-CM | POA: Diagnosis not present

## 2015-06-11 DIAGNOSIS — M546 Pain in thoracic spine: Secondary | ICD-10-CM | POA: Diagnosis not present

## 2015-06-11 DIAGNOSIS — M545 Low back pain: Secondary | ICD-10-CM | POA: Diagnosis not present

## 2015-06-11 DIAGNOSIS — M79605 Pain in left leg: Secondary | ICD-10-CM | POA: Diagnosis not present

## 2015-06-11 DIAGNOSIS — M79604 Pain in right leg: Secondary | ICD-10-CM | POA: Diagnosis not present

## 2015-06-14 DIAGNOSIS — H9209 Otalgia, unspecified ear: Secondary | ICD-10-CM | POA: Diagnosis not present

## 2015-06-14 DIAGNOSIS — H9190 Unspecified hearing loss, unspecified ear: Secondary | ICD-10-CM | POA: Diagnosis not present

## 2015-06-25 DIAGNOSIS — I509 Heart failure, unspecified: Secondary | ICD-10-CM | POA: Diagnosis not present

## 2015-06-25 DIAGNOSIS — H43811 Vitreous degeneration, right eye: Secondary | ICD-10-CM | POA: Diagnosis not present

## 2015-06-25 DIAGNOSIS — Z961 Presence of intraocular lens: Secondary | ICD-10-CM | POA: Diagnosis not present

## 2015-06-25 DIAGNOSIS — H2511 Age-related nuclear cataract, right eye: Secondary | ICD-10-CM | POA: Diagnosis not present

## 2015-06-25 DIAGNOSIS — Z9889 Other specified postprocedural states: Secondary | ICD-10-CM | POA: Diagnosis not present

## 2015-06-25 DIAGNOSIS — I2510 Atherosclerotic heart disease of native coronary artery without angina pectoris: Secondary | ICD-10-CM | POA: Diagnosis not present

## 2015-06-25 DIAGNOSIS — I110 Hypertensive heart disease with heart failure: Secondary | ICD-10-CM | POA: Diagnosis not present

## 2015-06-25 DIAGNOSIS — E119 Type 2 diabetes mellitus without complications: Secondary | ICD-10-CM | POA: Diagnosis not present

## 2015-06-25 DIAGNOSIS — Z9842 Cataract extraction status, left eye: Secondary | ICD-10-CM | POA: Diagnosis not present

## 2015-06-25 DIAGNOSIS — E785 Hyperlipidemia, unspecified: Secondary | ICD-10-CM | POA: Diagnosis not present

## 2015-06-25 DIAGNOSIS — Z87891 Personal history of nicotine dependence: Secondary | ICD-10-CM | POA: Diagnosis not present

## 2015-06-25 DIAGNOSIS — E039 Hypothyroidism, unspecified: Secondary | ICD-10-CM | POA: Diagnosis not present

## 2015-06-25 DIAGNOSIS — Z79899 Other long term (current) drug therapy: Secondary | ICD-10-CM | POA: Diagnosis not present

## 2015-06-25 DIAGNOSIS — H26492 Other secondary cataract, left eye: Secondary | ICD-10-CM | POA: Diagnosis not present

## 2015-06-25 DIAGNOSIS — Z7982 Long term (current) use of aspirin: Secondary | ICD-10-CM | POA: Diagnosis not present

## 2015-06-25 DIAGNOSIS — Z7984 Long term (current) use of oral hypoglycemic drugs: Secondary | ICD-10-CM | POA: Diagnosis not present

## 2015-07-02 DIAGNOSIS — L299 Pruritus, unspecified: Secondary | ICD-10-CM | POA: Diagnosis not present

## 2015-07-02 DIAGNOSIS — L853 Xerosis cutis: Secondary | ICD-10-CM | POA: Diagnosis not present

## 2015-07-02 DIAGNOSIS — L300 Nummular dermatitis: Secondary | ICD-10-CM | POA: Diagnosis not present

## 2015-07-12 ENCOUNTER — Encounter: Payer: Self-pay | Admitting: Physical Medicine & Rehabilitation

## 2015-07-12 ENCOUNTER — Encounter: Payer: Medicare Other | Attending: Physical Medicine & Rehabilitation | Admitting: Physical Medicine & Rehabilitation

## 2015-07-12 VITALS — BP 119/76 | HR 80 | Resp 14

## 2015-07-12 DIAGNOSIS — M5137 Other intervertebral disc degeneration, lumbosacral region: Secondary | ICD-10-CM | POA: Diagnosis not present

## 2015-07-12 DIAGNOSIS — M545 Low back pain: Secondary | ICD-10-CM | POA: Insufficient documentation

## 2015-07-12 DIAGNOSIS — I509 Heart failure, unspecified: Secondary | ICD-10-CM | POA: Insufficient documentation

## 2015-07-12 DIAGNOSIS — G894 Chronic pain syndrome: Secondary | ICD-10-CM

## 2015-07-12 DIAGNOSIS — I110 Hypertensive heart disease with heart failure: Secondary | ICD-10-CM | POA: Diagnosis not present

## 2015-07-12 DIAGNOSIS — Z7982 Long term (current) use of aspirin: Secondary | ICD-10-CM | POA: Insufficient documentation

## 2015-07-12 DIAGNOSIS — E119 Type 2 diabetes mellitus without complications: Secondary | ICD-10-CM | POA: Diagnosis not present

## 2015-07-12 DIAGNOSIS — K219 Gastro-esophageal reflux disease without esophagitis: Secondary | ICD-10-CM | POA: Diagnosis not present

## 2015-07-12 DIAGNOSIS — M1990 Unspecified osteoarthritis, unspecified site: Secondary | ICD-10-CM | POA: Diagnosis not present

## 2015-07-12 DIAGNOSIS — I2510 Atherosclerotic heart disease of native coronary artery without angina pectoris: Secondary | ICD-10-CM | POA: Insufficient documentation

## 2015-07-12 DIAGNOSIS — Z79899 Other long term (current) drug therapy: Secondary | ICD-10-CM | POA: Diagnosis not present

## 2015-07-12 DIAGNOSIS — E039 Hypothyroidism, unspecified: Secondary | ICD-10-CM | POA: Diagnosis not present

## 2015-07-12 DIAGNOSIS — Z5181 Encounter for therapeutic drug level monitoring: Secondary | ICD-10-CM | POA: Diagnosis not present

## 2015-07-12 DIAGNOSIS — G4733 Obstructive sleep apnea (adult) (pediatric): Secondary | ICD-10-CM | POA: Diagnosis not present

## 2015-07-12 DIAGNOSIS — F329 Major depressive disorder, single episode, unspecified: Secondary | ICD-10-CM | POA: Insufficient documentation

## 2015-07-12 DIAGNOSIS — I5032 Chronic diastolic (congestive) heart failure: Secondary | ICD-10-CM

## 2015-07-12 DIAGNOSIS — M47816 Spondylosis without myelopathy or radiculopathy, lumbar region: Secondary | ICD-10-CM | POA: Insufficient documentation

## 2015-07-12 DIAGNOSIS — Z7984 Long term (current) use of oral hypoglycemic drugs: Secondary | ICD-10-CM | POA: Diagnosis not present

## 2015-07-12 DIAGNOSIS — M549 Dorsalgia, unspecified: Secondary | ICD-10-CM | POA: Diagnosis not present

## 2015-07-12 DIAGNOSIS — Z87891 Personal history of nicotine dependence: Secondary | ICD-10-CM | POA: Diagnosis not present

## 2015-07-12 DIAGNOSIS — M4856XA Collapsed vertebra, not elsewhere classified, lumbar region, initial encounter for fracture: Secondary | ICD-10-CM | POA: Insufficient documentation

## 2015-07-12 MED ORDER — LIDOCAINE 5 % EX PTCH: 1.0000 | MEDICATED_PATCH | CUTANEOUS | Status: DC

## 2015-07-12 MED ORDER — DULOXETINE HCL 30 MG PO CPEP: 30.0000 mg | ORAL_CAPSULE | Freq: Every day | ORAL | Status: DC

## 2015-07-12 MED ORDER — TRAMADOL HCL 50 MG PO TABS: 50.0000 mg | ORAL_TABLET | Freq: Four times a day (QID) | ORAL | Status: DC | PRN
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
## 2015-07-16 DIAGNOSIS — E119 Type 2 diabetes mellitus without complications: Secondary | ICD-10-CM | POA: Diagnosis not present

## 2015-07-16 DIAGNOSIS — L300 Nummular dermatitis: Secondary | ICD-10-CM | POA: Diagnosis not present

## 2015-07-16 DIAGNOSIS — R0602 Shortness of breath: Secondary | ICD-10-CM | POA: Diagnosis not present

## 2015-07-16 DIAGNOSIS — R609 Edema, unspecified: Secondary | ICD-10-CM | POA: Diagnosis not present

## 2015-07-16 DIAGNOSIS — I5030 Unspecified diastolic (congestive) heart failure: Secondary | ICD-10-CM | POA: Diagnosis not present

## 2015-07-16 DIAGNOSIS — E039 Hypothyroidism, unspecified: Secondary | ICD-10-CM | POA: Diagnosis not present

## 2015-07-16 DIAGNOSIS — Z6841 Body Mass Index (BMI) 40.0 and over, adult: Secondary | ICD-10-CM | POA: Diagnosis not present

## 2015-07-18 LAB — TOXASSURE SELECT,+ANTIDEPR,UR: PDF: 0

## 2015-07-19 ENCOUNTER — Telehealth: Payer: Self-pay | Admitting: *Deleted

## 2015-07-19 NOTE — Telephone Encounter (Signed)
Contacted patient to let him know that the prior authorization for lidocaine 5% patches has been approved. Supporting documentation has been scanned into the media tab

## 2015-07-23 NOTE — Progress Notes (Signed)
Urine drug screen for this encounter is consistent for prescribed medication 

## 2015-07-24 DIAGNOSIS — H906 Mixed conductive and sensorineural hearing loss, bilateral: Secondary | ICD-10-CM | POA: Diagnosis not present

## 2015-07-24 DIAGNOSIS — H9193 Unspecified hearing loss, bilateral: Secondary | ICD-10-CM | POA: Diagnosis not present

## 2015-07-27 DIAGNOSIS — H2511 Age-related nuclear cataract, right eye: Secondary | ICD-10-CM | POA: Diagnosis not present

## 2015-07-27 DIAGNOSIS — Z48810 Encounter for surgical aftercare following surgery on the sense organs: Secondary | ICD-10-CM | POA: Diagnosis not present

## 2015-07-27 DIAGNOSIS — Z961 Presence of intraocular lens: Secondary | ICD-10-CM | POA: Diagnosis not present

## 2015-08-06 DIAGNOSIS — L299 Pruritus, unspecified: Secondary | ICD-10-CM | POA: Diagnosis not present

## 2015-08-06 DIAGNOSIS — L300 Nummular dermatitis: Secondary | ICD-10-CM | POA: Diagnosis not present

## 2015-08-10 DIAGNOSIS — R351 Nocturia: Secondary | ICD-10-CM | POA: Diagnosis not present

## 2015-08-10 DIAGNOSIS — Z6841 Body Mass Index (BMI) 40.0 and over, adult: Secondary | ICD-10-CM | POA: Diagnosis not present

## 2015-08-10 DIAGNOSIS — E119 Type 2 diabetes mellitus without complications: Secondary | ICD-10-CM | POA: Diagnosis not present

## 2015-08-10 DIAGNOSIS — I5030 Unspecified diastolic (congestive) heart failure: Secondary | ICD-10-CM | POA: Diagnosis not present

## 2015-08-10 DIAGNOSIS — I959 Hypotension, unspecified: Secondary | ICD-10-CM | POA: Diagnosis not present

## 2015-08-23 ENCOUNTER — Encounter: Payer: Medicare Other | Attending: Physical Medicine & Rehabilitation | Admitting: Physical Medicine & Rehabilitation

## 2015-08-23 DIAGNOSIS — M549 Dorsalgia, unspecified: Secondary | ICD-10-CM | POA: Insufficient documentation

## 2015-08-23 DIAGNOSIS — Z7982 Long term (current) use of aspirin: Secondary | ICD-10-CM | POA: Insufficient documentation

## 2015-08-23 DIAGNOSIS — F329 Major depressive disorder, single episode, unspecified: Secondary | ICD-10-CM | POA: Insufficient documentation

## 2015-08-23 DIAGNOSIS — M545 Low back pain: Secondary | ICD-10-CM | POA: Insufficient documentation

## 2015-08-23 DIAGNOSIS — M1990 Unspecified osteoarthritis, unspecified site: Secondary | ICD-10-CM | POA: Insufficient documentation

## 2015-08-23 DIAGNOSIS — I2510 Atherosclerotic heart disease of native coronary artery without angina pectoris: Secondary | ICD-10-CM | POA: Insufficient documentation

## 2015-08-23 DIAGNOSIS — M4856XA Collapsed vertebra, not elsewhere classified, lumbar region, initial encounter for fracture: Secondary | ICD-10-CM | POA: Insufficient documentation

## 2015-08-23 DIAGNOSIS — G4733 Obstructive sleep apnea (adult) (pediatric): Secondary | ICD-10-CM | POA: Insufficient documentation

## 2015-08-23 DIAGNOSIS — I509 Heart failure, unspecified: Secondary | ICD-10-CM | POA: Insufficient documentation

## 2015-08-23 DIAGNOSIS — Z87891 Personal history of nicotine dependence: Secondary | ICD-10-CM | POA: Insufficient documentation

## 2015-08-23 DIAGNOSIS — M5137 Other intervertebral disc degeneration, lumbosacral region: Secondary | ICD-10-CM | POA: Insufficient documentation

## 2015-08-23 DIAGNOSIS — E039 Hypothyroidism, unspecified: Secondary | ICD-10-CM | POA: Insufficient documentation

## 2015-08-23 DIAGNOSIS — K219 Gastro-esophageal reflux disease without esophagitis: Secondary | ICD-10-CM | POA: Insufficient documentation

## 2015-08-23 DIAGNOSIS — M47816 Spondylosis without myelopathy or radiculopathy, lumbar region: Secondary | ICD-10-CM | POA: Insufficient documentation

## 2015-08-23 DIAGNOSIS — I110 Hypertensive heart disease with heart failure: Secondary | ICD-10-CM | POA: Insufficient documentation

## 2015-08-23 DIAGNOSIS — Z7984 Long term (current) use of oral hypoglycemic drugs: Secondary | ICD-10-CM | POA: Insufficient documentation

## 2015-08-23 DIAGNOSIS — E119 Type 2 diabetes mellitus without complications: Secondary | ICD-10-CM | POA: Insufficient documentation

## 2015-09-04 DIAGNOSIS — F418 Other specified anxiety disorders: Secondary | ICD-10-CM | POA: Diagnosis not present

## 2015-09-04 DIAGNOSIS — I5030 Unspecified diastolic (congestive) heart failure: Secondary | ICD-10-CM | POA: Diagnosis not present

## 2015-09-04 DIAGNOSIS — R0602 Shortness of breath: Secondary | ICD-10-CM | POA: Diagnosis not present

## 2015-09-04 DIAGNOSIS — E119 Type 2 diabetes mellitus without complications: Secondary | ICD-10-CM | POA: Diagnosis not present

## 2015-09-05 DIAGNOSIS — R0602 Shortness of breath: Secondary | ICD-10-CM | POA: Diagnosis not present

## 2015-09-05 DIAGNOSIS — R918 Other nonspecific abnormal finding of lung field: Secondary | ICD-10-CM | POA: Diagnosis not present

## 2015-09-05 DIAGNOSIS — J9811 Atelectasis: Secondary | ICD-10-CM | POA: Diagnosis not present

## 2015-09-06 ENCOUNTER — Encounter (HOSPITAL_BASED_OUTPATIENT_CLINIC_OR_DEPARTMENT_OTHER): Payer: Medicare Other | Admitting: Physical Medicine & Rehabilitation

## 2015-09-06 ENCOUNTER — Encounter: Payer: Self-pay | Admitting: Physical Medicine & Rehabilitation

## 2015-09-06 VITALS — BP 128/70 | HR 92 | Resp 15

## 2015-09-06 DIAGNOSIS — M4856XA Collapsed vertebra, not elsewhere classified, lumbar region, initial encounter for fracture: Secondary | ICD-10-CM | POA: Diagnosis not present

## 2015-09-06 DIAGNOSIS — I509 Heart failure, unspecified: Secondary | ICD-10-CM

## 2015-09-06 DIAGNOSIS — Z7984 Long term (current) use of oral hypoglycemic drugs: Secondary | ICD-10-CM | POA: Diagnosis not present

## 2015-09-06 DIAGNOSIS — G4733 Obstructive sleep apnea (adult) (pediatric): Secondary | ICD-10-CM | POA: Diagnosis not present

## 2015-09-06 DIAGNOSIS — M5441 Lumbago with sciatica, right side: Secondary | ICD-10-CM

## 2015-09-06 DIAGNOSIS — Z7982 Long term (current) use of aspirin: Secondary | ICD-10-CM | POA: Diagnosis not present

## 2015-09-06 DIAGNOSIS — F329 Major depressive disorder, single episode, unspecified: Secondary | ICD-10-CM | POA: Diagnosis not present

## 2015-09-06 DIAGNOSIS — M5137 Other intervertebral disc degeneration, lumbosacral region: Secondary | ICD-10-CM | POA: Diagnosis not present

## 2015-09-06 DIAGNOSIS — M549 Dorsalgia, unspecified: Secondary | ICD-10-CM | POA: Diagnosis not present

## 2015-09-06 DIAGNOSIS — M1990 Unspecified osteoarthritis, unspecified site: Secondary | ICD-10-CM | POA: Diagnosis not present

## 2015-09-06 DIAGNOSIS — E039 Hypothyroidism, unspecified: Secondary | ICD-10-CM | POA: Diagnosis not present

## 2015-09-06 DIAGNOSIS — Z87891 Personal history of nicotine dependence: Secondary | ICD-10-CM | POA: Diagnosis not present

## 2015-09-06 DIAGNOSIS — G894 Chronic pain syndrome: Secondary | ICD-10-CM | POA: Diagnosis not present

## 2015-09-06 DIAGNOSIS — M545 Low back pain: Secondary | ICD-10-CM | POA: Diagnosis not present

## 2015-09-06 DIAGNOSIS — I2510 Atherosclerotic heart disease of native coronary artery without angina pectoris: Secondary | ICD-10-CM | POA: Diagnosis not present

## 2015-09-06 DIAGNOSIS — M47816 Spondylosis without myelopathy or radiculopathy, lumbar region: Secondary | ICD-10-CM | POA: Diagnosis not present

## 2015-09-06 DIAGNOSIS — K219 Gastro-esophageal reflux disease without esophagitis: Secondary | ICD-10-CM | POA: Diagnosis not present

## 2015-09-06 DIAGNOSIS — E119 Type 2 diabetes mellitus without complications: Secondary | ICD-10-CM | POA: Diagnosis not present

## 2015-09-06 DIAGNOSIS — I110 Hypertensive heart disease with heart failure: Secondary | ICD-10-CM | POA: Diagnosis not present

## 2015-09-06 MED ORDER — TRAMADOL HCL 50 MG PO TABS: 50.0000 mg | ORAL_TABLET | Freq: Four times a day (QID) | ORAL | Status: DC | PRN
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
## 2015-09-06 NOTE — Addendum Note (Signed)
Addended by: Delice Lesch A on: 09/06/2015 11:59 AM   Modules accepted: Orders

## 2015-09-11 DIAGNOSIS — I5030 Unspecified diastolic (congestive) heart failure: Secondary | ICD-10-CM | POA: Diagnosis not present

## 2015-09-11 DIAGNOSIS — R0602 Shortness of breath: Secondary | ICD-10-CM | POA: Diagnosis not present

## 2015-10-16 DIAGNOSIS — R609 Edema, unspecified: Secondary | ICD-10-CM | POA: Diagnosis not present

## 2015-10-16 DIAGNOSIS — I10 Essential (primary) hypertension: Secondary | ICD-10-CM | POA: Diagnosis not present

## 2015-10-16 DIAGNOSIS — E119 Type 2 diabetes mellitus without complications: Secondary | ICD-10-CM | POA: Diagnosis not present

## 2015-10-17 DIAGNOSIS — E119 Type 2 diabetes mellitus without complications: Secondary | ICD-10-CM | POA: Diagnosis not present

## 2015-10-17 DIAGNOSIS — Z961 Presence of intraocular lens: Secondary | ICD-10-CM | POA: Diagnosis not present

## 2015-10-17 DIAGNOSIS — H401134 Primary open-angle glaucoma, bilateral, indeterminate stage: Secondary | ICD-10-CM | POA: Diagnosis not present

## 2015-12-06 ENCOUNTER — Encounter: Payer: Self-pay | Admitting: Registered Nurse

## 2015-12-06 ENCOUNTER — Encounter: Payer: Medicare Other | Attending: Registered Nurse | Admitting: Registered Nurse

## 2015-12-06 VITALS — BP 94/64 | HR 97

## 2015-12-06 DIAGNOSIS — M4306 Spondylolysis, lumbar region: Secondary | ICD-10-CM

## 2015-12-06 DIAGNOSIS — I110 Hypertensive heart disease with heart failure: Secondary | ICD-10-CM | POA: Insufficient documentation

## 2015-12-06 DIAGNOSIS — M4856XA Collapsed vertebra, not elsewhere classified, lumbar region, initial encounter for fracture: Secondary | ICD-10-CM | POA: Insufficient documentation

## 2015-12-06 DIAGNOSIS — I509 Heart failure, unspecified: Secondary | ICD-10-CM | POA: Insufficient documentation

## 2015-12-06 DIAGNOSIS — F329 Major depressive disorder, single episode, unspecified: Secondary | ICD-10-CM | POA: Insufficient documentation

## 2015-12-06 DIAGNOSIS — Z7982 Long term (current) use of aspirin: Secondary | ICD-10-CM | POA: Insufficient documentation

## 2015-12-06 DIAGNOSIS — M1990 Unspecified osteoarthritis, unspecified site: Secondary | ICD-10-CM | POA: Insufficient documentation

## 2015-12-06 DIAGNOSIS — M545 Low back pain: Secondary | ICD-10-CM | POA: Insufficient documentation

## 2015-12-06 DIAGNOSIS — Z7984 Long term (current) use of oral hypoglycemic drugs: Secondary | ICD-10-CM | POA: Insufficient documentation

## 2015-12-06 DIAGNOSIS — E118 Type 2 diabetes mellitus with unspecified complications: Secondary | ICD-10-CM | POA: Insufficient documentation

## 2015-12-06 DIAGNOSIS — G8929 Other chronic pain: Secondary | ICD-10-CM
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
## 2015-12-11 ENCOUNTER — Encounter: Payer: Medicare Other | Attending: Registered Nurse | Admitting: Registered Nurse

## 2015-12-11 ENCOUNTER — Encounter: Payer: Self-pay | Admitting: Registered Nurse

## 2015-12-11 VITALS — BP 102/69 | HR 90 | Temp 97.5°F

## 2015-12-11 DIAGNOSIS — I110 Hypertensive heart disease with heart failure: Secondary | ICD-10-CM | POA: Insufficient documentation

## 2015-12-11 DIAGNOSIS — E118 Type 2 diabetes mellitus with unspecified complications: Secondary | ICD-10-CM | POA: Insufficient documentation

## 2015-12-11 DIAGNOSIS — M4856XA Collapsed vertebra, not elsewhere classified, lumbar region, initial encounter for fracture: Secondary | ICD-10-CM | POA: Insufficient documentation

## 2015-12-11 DIAGNOSIS — M545 Low back pain: Secondary | ICD-10-CM | POA: Insufficient documentation

## 2015-12-11 DIAGNOSIS — F329 Major depressive disorder, single episode, unspecified: Secondary | ICD-10-CM | POA: Insufficient documentation

## 2015-12-11 DIAGNOSIS — Z7982 Long term (current) use of aspirin: Secondary | ICD-10-CM | POA: Insufficient documentation

## 2015-12-11 DIAGNOSIS — M4306 Spondylolysis, lumbar region: Secondary | ICD-10-CM | POA: Diagnosis not present

## 2015-12-11 DIAGNOSIS — Z7984 Long term (current) use of oral hypoglycemic drugs: Secondary | ICD-10-CM | POA: Insufficient documentation

## 2015-12-11 DIAGNOSIS — G8929 Other chronic pain: Secondary | ICD-10-CM

## 2015-12-11 DIAGNOSIS — M1990 Unspecified osteoarthritis, unspecified site: Secondary | ICD-10-CM | POA: Insufficient documentation

## 2015-12-11 DIAGNOSIS — I509 Heart failure, unspecified: Secondary | ICD-10-CM | POA: Insufficient documentation
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
## 2015-12-13 ENCOUNTER — Encounter (HOSPITAL_BASED_OUTPATIENT_CLINIC_OR_DEPARTMENT_OTHER): Payer: Medicare Other | Admitting: Registered Nurse

## 2015-12-13 ENCOUNTER — Encounter: Payer: Self-pay | Admitting: Registered Nurse

## 2015-12-13 VITALS — BP 91/61 | HR 77

## 2015-12-13 DIAGNOSIS — M4306 Spondylolysis, lumbar region: Secondary | ICD-10-CM

## 2015-12-13 DIAGNOSIS — M545 Low back pain, unspecified: Secondary | ICD-10-CM

## 2015-12-13 DIAGNOSIS — G8929 Other chronic pain: Secondary | ICD-10-CM
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
## 2015-12-14 DIAGNOSIS — Z6838 Body mass index (BMI) 38.0-38.9, adult: Secondary | ICD-10-CM | POA: Diagnosis not present

## 2015-12-14 DIAGNOSIS — J029 Acute pharyngitis, unspecified: Secondary | ICD-10-CM | POA: Diagnosis not present

## 2015-12-14 DIAGNOSIS — I889 Nonspecific lymphadenitis, unspecified: Secondary | ICD-10-CM | POA: Diagnosis not present

## 2015-12-18 ENCOUNTER — Encounter: Payer: Self-pay | Admitting: Registered Nurse

## 2015-12-18 ENCOUNTER — Encounter (HOSPITAL_BASED_OUTPATIENT_CLINIC_OR_DEPARTMENT_OTHER): Payer: Medicare Other | Admitting: Registered Nurse

## 2015-12-18 VITALS — BP 92/61 | HR 97

## 2015-12-18 DIAGNOSIS — M4306 Spondylolysis, lumbar region: Secondary | ICD-10-CM

## 2015-12-18 DIAGNOSIS — G8929 Other chronic pain: Secondary | ICD-10-CM

## 2015-12-18 DIAGNOSIS — M545 Low back pain: Secondary | ICD-10-CM | POA: Diagnosis not present
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
## 2015-12-19 DIAGNOSIS — G4733 Obstructive sleep apnea (adult) (pediatric): Secondary | ICD-10-CM | POA: Diagnosis not present

## 2015-12-19 DIAGNOSIS — Z6839 Body mass index (BMI) 39.0-39.9, adult: Secondary | ICD-10-CM | POA: Diagnosis not present

## 2015-12-19 DIAGNOSIS — J029 Acute pharyngitis, unspecified: Secondary | ICD-10-CM | POA: Diagnosis not present

## 2015-12-20 ENCOUNTER — Encounter (HOSPITAL_BASED_OUTPATIENT_CLINIC_OR_DEPARTMENT_OTHER): Payer: Medicare Other | Admitting: Registered Nurse

## 2015-12-20 ENCOUNTER — Encounter: Payer: Self-pay | Admitting: Registered Nurse

## 2015-12-20 VITALS — BP 123/74 | HR 106 | Resp 16

## 2015-12-20 DIAGNOSIS — M4306 Spondylolysis, lumbar region: Secondary | ICD-10-CM

## 2015-12-20 DIAGNOSIS — G8929 Other chronic pain: Secondary | ICD-10-CM

## 2015-12-20 DIAGNOSIS — M545 Low back pain: Secondary | ICD-10-CM | POA: Diagnosis not present
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
## 2015-12-25 ENCOUNTER — Encounter (HOSPITAL_COMMUNITY): Payer: Self-pay

## 2015-12-25 ENCOUNTER — Emergency Department (HOSPITAL_COMMUNITY): Payer: Medicare Other

## 2015-12-25 ENCOUNTER — Encounter: Payer: Self-pay | Admitting: Registered Nurse

## 2015-12-25 ENCOUNTER — Emergency Department (HOSPITAL_COMMUNITY)
Admission: EM | Admit: 2015-12-25 | Discharge: 2015-12-25 | Disposition: A | Discharge status: Home / Self Care | Payer: Medicare Other | Attending: Emergency Medicine | Admitting: Emergency Medicine

## 2015-12-25 ENCOUNTER — Encounter: Payer: Medicare Other | Admitting: Registered Nurse

## 2015-12-25 VITALS — BP 121/78 | HR 119 | Resp 16

## 2015-12-25 DIAGNOSIS — I110 Hypertensive heart disease with heart failure: Secondary | ICD-10-CM | POA: Diagnosis not present

## 2015-12-25 DIAGNOSIS — Z87891 Personal history of nicotine dependence: Secondary | ICD-10-CM | POA: Diagnosis not present

## 2015-12-25 DIAGNOSIS — M546 Pain in thoracic spine: Secondary | ICD-10-CM | POA: Diagnosis not present

## 2015-12-25 DIAGNOSIS — E039 Hypothyroidism, unspecified: Secondary | ICD-10-CM | POA: Insufficient documentation

## 2015-12-25 DIAGNOSIS — E119 Type 2 diabetes mellitus without complications: Secondary | ICD-10-CM | POA: Insufficient documentation

## 2015-12-25 DIAGNOSIS — R0602 Shortness of breath: Secondary | ICD-10-CM

## 2015-12-25 DIAGNOSIS — M4306 Spondylolysis, lumbar region: Secondary | ICD-10-CM

## 2015-12-25 DIAGNOSIS — I2510 Atherosclerotic heart disease of native coronary artery without angina pectoris: Secondary | ICD-10-CM | POA: Insufficient documentation

## 2015-12-25 DIAGNOSIS — Z79899 Other long term (current) drug therapy: Secondary | ICD-10-CM | POA: Insufficient documentation

## 2015-12-25 DIAGNOSIS — R079 Chest pain, unspecified: Secondary | ICD-10-CM | POA: Diagnosis not present

## 2015-12-25 DIAGNOSIS — M549 Dorsalgia, unspecified: Secondary | ICD-10-CM | POA: Diagnosis present

## 2015-12-25 DIAGNOSIS — R000 Tachycardia, unspecified: Secondary | ICD-10-CM

## 2015-12-25 DIAGNOSIS — I5033 Acute on chronic diastolic (congestive) heart failure: Secondary | ICD-10-CM | POA: Diagnosis not present

## 2015-12-25 DIAGNOSIS — G894 Chronic pain syndrome: Secondary | ICD-10-CM

## 2015-12-25 DIAGNOSIS — M62830 Muscle spasm of back: Secondary | ICD-10-CM

## 2015-12-25 DIAGNOSIS — Z7982 Long term (current) use of aspirin: Secondary | ICD-10-CM | POA: Insufficient documentation

## 2015-12-25 LAB — CBC WITH DIFFERENTIAL/PLATELET
Basophils Absolute: 0.1 10*3/uL (ref 0.0–0.1)
Basophils Relative: 1 %
Eosinophils Absolute: 0.3 10*3/uL (ref 0.0–0.7)
Eosinophils Relative: 4 %
HCT: 41.5 % (ref 39.0–52.0)
Hemoglobin: 14.5 g/dL (ref 13.0–17.0)
Lymphocytes Relative: 23 %
Lymphs Abs: 1.8 10*3/uL (ref 0.7–4.0)
MCH: 29.7 pg (ref 26.0–34.0)
MCHC: 34.9 g/dL (ref 30.0–36.0)
MCV: 84.9 fL (ref 78.0–100.0)
Monocytes Absolute: 0.6 10*3/uL (ref 0.1–1.0)
Monocytes Relative: 8 %
Neutro Abs: 5.1 10*3/uL (ref 1.7–7.7)
Neutrophils Relative %: 64 %
Platelets: 243 10*3/uL (ref 150–400)
RBC: 4.89 MIL/uL (ref 4.22–5.81)
RDW: 13.5 % (ref 11.5–15.5)
WBC: 8 10*3/uL (ref 4.0–10.5)

## 2015-12-25 LAB — BASIC METABOLIC PANEL
Anion gap: 14 (ref 5–15)
BUN: 13 mg/dL (ref 6–20)
CO2: 23 mmol/L (ref 22–32)
Calcium: 10.3 mg/dL (ref 8.9–10.3)
Chloride: 95 mmol/L — ABNORMAL LOW (ref 101–111)
Creatinine, Ser: 0.89 mg/dL (ref 0.61–1.24)
GFR calc Af Amer: >60 mL/min (ref >60)
GFR calc non Af Amer: >60 mL/min (ref >60)
Glucose, Bld: 397 mg/dL — ABNORMAL HIGH (ref 65–99)
Potassium: 3.7 mmol/L (ref 3.5–5.1)
Sodium: 132 mmol/L — ABNORMAL LOW (ref 135–145)

## 2015-12-25 LAB — URINALYSIS, ROUTINE W REFLEX MICROSCOPIC
Bilirubin Urine: NEGATIVE
Glucose, UA: >1000 mg/dL — AB
Hgb urine dipstick: NEGATIVE
Ketones, ur: NEGATIVE mg/dL
Leukocytes, UA: NEGATIVE
Nitrite: NEGATIVE
Protein, ur: NEGATIVE mg/dL
Specific Gravity, Urine: 1.032 — ABNORMAL HIGH (ref 1.005–1.030)
pH: 6.5 (ref 5.0–8.0)

## 2015-12-25 LAB — URINE MICROSCOPIC-ADD ON: Bacteria, UA: NONE SEEN

## 2015-12-25 LAB — I-STAT TROPONIN, ED: Troponin i, poc: 0 ng/mL (ref 0.00–0.08)

## 2015-12-25 LAB — TROPONIN I: Troponin I: <0.03 ng/mL (ref <0.03)

## 2015-12-25 MED ORDER — SODIUM CHLORIDE 0.9 % IV BOLUS (SEPSIS)
1000.0000 mL | Freq: Once | INTRAVENOUS | Status: AC
  Administered 2015-12-25: 1000 mL via INTRAVENOUS

## 2015-12-25 MED ORDER — OXYCODONE-ACETAMINOPHEN 5-325 MG PO TABS
1.0000 | ORAL_TABLET | Freq: Once | ORAL | Status: AC
  Administered 2015-12-25: 1 via ORAL
  Filled 2015-12-25: qty 1

## 2015-12-25 MED ORDER — IOPAMIDOL (ISOVUE-370) INJECTION 76%
INTRAVENOUS | Status: AC
  Filled 2015-12-25: qty 100

## 2015-12-25 MED ORDER — SODIUM CHLORIDE 0.9 % IV BOLUS (SEPSIS)
500.0000 mL | Freq: Once | INTRAVENOUS | Status: AC
  Administered 2015-12-25: 500 mL via INTRAVENOUS

## 2015-12-25 MED ORDER — MORPHINE SULFATE (PF) 4 MG/ML IV SOLN
4.0000 mg | Freq: Once | INTRAVENOUS | Status: AC
  Administered 2015-12-25: 4 mg via INTRAVENOUS
  Filled 2015-12-25: qty 1

## 2015-12-25 MED ORDER — HYDROMORPHONE HCL 1 MG/ML IJ SOLN
1.0000 mg | Freq: Once | INTRAMUSCULAR | Status: AC
  Administered 2015-12-25: 1 mg via INTRAVENOUS
  Filled 2015-12-25: qty 1

## 2015-12-25 MED ORDER — BACLOFEN 10 MG PO TABS: 5.0000 mg | ORAL_TABLET | Freq: Two times a day (BID) | ORAL | 0 refills | Status: DC | PRN

## 2015-12-25 MED ORDER — IOPAMIDOL (ISOVUE-370) INJECTION 76%
INTRAVENOUS | Status: AC
  Filled 2015-12-25: qty 50

## 2015-12-25 NOTE — ED Provider Notes (Signed)
[name] DEPT Provider Note   CSN: [id number] Arrival date & time: 12/25/15  1535     History   Chief Complaint Chief Complaint  Patient presents with  . Chest Pain    HPI Joshua Lara is a 70 y.o. male.  HPI   81 YOM Presents today with back and rib pain. Patient reports the symptoms are sharp in nature between his lower shoulder blades with radiation around the right lateral ribs. Patient reports sharp pain with deep inspiration for movement of the torso. Patient reports associated shortness of breath with these inspirations, no shortness of breath at baseline. Patient reports the symptoms have come and gone over the last several years, and has been seen by his chiropractor and primary care provider. He reports they last for several months and then resolve on the run. Patient denies any associated nausea or vomiting, or chills, abdominal pain, lower extremity neurological deficits. Patient reports that laying flat makes symptoms worse, they are not made worse with ambulation or exertion. Patient denies any lower extremity swelling or edema, history DVT or PE. Patient is a nonsmoker.    Past Medical History:  Diagnosis Date  . Arthritis    "hands, back, ankles" (05/11/2014)  . CHF (congestive heart failure) ([place])   . Chronic lower back pain   . Compression fracture of lumbar vertebra ([place])   . Coronary artery disease   . Daily headache    "recently" (05/11/2014)  . Depression    "I might be slight" (05/11/2014)  . GERD (gastroesophageal reflux disease)   . High cholesterol   . Hypertension   . Hypothyroid   . OSA treated with BiPAP   . Pneumonia    "couple times; last time was 03/2011" (05/11/2014)  . Type II diabetes mellitus [facility])     Patient Active Problem List   Diagnosis Date Noted  . Acute on chronic diastolic heart failure ([place]) 05/15/2014  . Dyspnea 05/10/2014  . Acute respiratory failure with hypoxemia ([place]) 05/10/2014  . OSA on CPAP 05/10/2014    . Abnormal CT scan, chest 05/10/2014  . DOE (dyspnea on exertion) 04/05/2011  . PNA (pneumonia) 04/05/2011  . DM (diabetes mellitus) ([place]) 04/05/2011  . HTN (hypertension) 04/05/2011  . Tachycardia 04/05/2011  . Chest pain 04/05/2011  . Fever 04/05/2011  . Leukocytosis 04/05/2011  . Hypothyroidism 04/05/2011  . GERD (gastroesophageal reflux disease) 04/05/2011  . RA (rheumatoid arthritis) ([place]) 04/05/2011  . Obesity 04/05/2011  . CAD in native artery 04/05/2011  . Hypokalemia 04/05/2011    Past Surgical History:  Procedure Laterality Date  . APPENDECTOMY    . CARDIAC CATHETERIZATION  2001; 2009  . CATARACT EXTRACTION W/ INTRAOCULAR LENS IMPLANT Left   . CHOLECYSTECTOMY  1980's   Archie Endo 07/13/2010   . EYE MUSCLE SURGERY Left   . LEFT AND RIGHT HEART CATHETERIZATION WITH CORONARY ANGIOGRAM N/A 05/30/2014   Procedure: LEFT AND RIGHT HEART CATHETERIZATION WITH CORONARY ANGIOGRAM;  Surgeon: Jolaine Artist, MD;  Location: [facility] CATH LAB;  Service: Cardiovascular;  Laterality: N/A;  . MASTOIDECTOMY Right 1970's   Archie Endo 07/13/2010  . SHOULDER OPEN ROTATOR CUFF REPAIR Left 07/2010  . SPHINCTEROTOMY    . TONSILLECTOMY AND ADENOIDECTOMY         Home Medications    Prior to Admission medications   Medication Sig Start Date End Date Taking? Authorizing Provider  amoxicillin (AMOXIL) 500 MG capsule Take 500 mg by mouth daily. For infection   Yes Historical Provider, MD  aspirin EC  81 MG tablet Take 81 mg by mouth daily.     Yes Historical Provider, MD  bimatoprost (LUMIGAN) 0.01 % SOLN Place 1 drop into both eyes at bedtime.  10/17/15  Yes Historical Provider, MD  Cholecalciferol (VITAMIN D3) 2000 units capsule Take 2,000 Units by mouth daily.   Yes Historical Provider, MD  clobetasol cream (TEMOVATE) [id number] % Apply 1 application topically daily.  03/24/14  Yes Historical Provider, MD  Coenzyme Q10-Vitamin E (QUNOL ULTRA COQ10 PO) Take 100 mg by mouth daily.   Yes Historical Provider, MD   dorzolamide-timolol (COSOPT) 22.3-6.8 MG/ML ophthalmic solution Place 1 drop into both eyes 2 (two) times daily.   Yes Historical Provider, MD  escitalopram (LEXAPRO) 10 MG tablet Take 10 mg by mouth daily.   Yes Historical Provider, MD  furosemide (LASIX) 80 MG tablet Take 80 mg by mouth daily.   Yes Historical Provider, MD  gemfibrozil (LOPID) 600 MG tablet Take 600 mg by mouth 2 (two) times daily. 05/16/14  Yes Historical Provider, MD  glipiZIDE (GLUCOTROL XL) 10 MG 24 hr tablet Take 10 mg by mouth 2 (two) times daily.   Yes Historical Provider, MD  levothyroxine (SYNTHROID, LEVOTHROID) 100 MCG tablet Take 100 mcg by mouth daily.     Yes Historical Provider, MD  lidocaine (LIDODERM) 5 % Place 1 patch onto the skin daily. Remove & Discard patch within 12 hours or as directed by MD Patient taking differently: Place 1 patch onto the skin daily as needed (for pain). Remove & Discard patch within 12 hours or as directed by MD 07/12/15  Yes Ankit Lorie Phenix, MD  losartan (COZAAR) 25 MG tablet Take 25 mg by mouth daily.   Yes Historical Provider, MD  magnesium oxide (MAG-OX) 400 MG tablet Take 400-800 mg by mouth See admin instructions. 400 mg once a day for one week then alternate with 400 mg two times a day for one week   Yes Historical Provider, MD  mometasone (NASONEX) 50 MCG/ACT nasal spray Place 2 sprays into the nose daily.     Yes Historical Provider, MD  nitroGLYCERIN (NITROSTAT) 0.4 MG SL tablet Place 0.4 mg under the tongue every 5 (five) minutes as needed for chest pain.   Yes Historical Provider, MD  ONE TOUCH ULTRA TEST test strip 2 (two) times daily. use for testing 04/23/14  Yes Historical Provider, MD  potassium chloride (MICRO-K) 10 MEQ CR capsule Take 10 mEq by mouth 2 (two) times daily.    Yes Historical Provider, MD  ranitidine (ZANTAC) 150 MG capsule Take 150 mg by mouth daily as needed for heartburn.    Yes Historical Provider, MD  sitaGLIPtan-metformin (JANUMET) 50-1000 MG per tablet  Take 1 tablet by mouth 2 (two) times daily with a meal.     Yes Historical Provider, MD  traMADol (ULTRAM) 50 MG tablet Take 1 tablet (50 mg total) by mouth every 6 (six) hours as needed. Patient taking differently: Take 50 mg by mouth every 6 (six) hours as needed (for pain).  09/06/15  Yes Ankit Lorie Phenix, MD  baclofen (LIORESAL) 10 MG tablet Take 5 mg by mouth 2 (two) times daily as needed for muscle spasms.    Historical Provider, MD  furosemide (LASIX) 40 MG tablet Take 1 tablet (40 mg total) by mouth daily. Patient not taking: Reported on 12/25/2015 05/15/14   Kelvin Cellar, MD    Family History Family History  Problem Relation Age of Onset  . Heart attack Father  Social History Social History  Substance Use Topics  . Smoking status: Former Smoker    Packs/day: 2.00    Years: 30.00    Types: Cigarettes    Quit date: 05/12/1990  . Smokeless tobacco: Never Used  . Alcohol use 0.0 oz/week     Comment: 05/11/2014 "an occasional beer when out w/people; not often"     Allergies   Duloxetine hcl; Etanercept; and Tape   Review of Systems Review of Systems  All other systems reviewed and are negative.    Physical Exam Updated Vital Signs BP 108/64   Pulse 94   Temp 98.4 F (36.9 C) (Oral)   Resp 19   Ht 5\' 9"  (1.753 m)   Wt 117.9 kg   SpO2 93%   BMI 38.40 kg/m   Physical Exam  Constitutional: He is oriented to person, place, and time. He appears well-developed and well-nourished.  HENT:  Head: Normocephalic and atraumatic.  Eyes: Conjunctivae are normal. Pupils are equal, round, and reactive to light. Right eye exhibits no discharge. Left eye exhibits no discharge. No scleral icterus.  Neck: Normal range of motion. No JVD present. No tracheal deviation present.  Cardiovascular: Normal rate and regular rhythm.   Pulmonary/Chest: Effort normal and breath sounds normal. No stridor. No respiratory distress. He has no wheezes. He has no rales. He exhibits no  tenderness.  Abdominal: Soft. He exhibits no distension.  Musculoskeletal:  Exquisite tenderness to the lower thoracic paraspinal muscles checking along the lateral ribs, no rashes, no signs of bruising or bleeding. Lung expansion normal, pain with deep inspiration. No lower extremity swelling or edema  Neurological: He is alert and oriented to person, place, and time. Coordination normal.  Psychiatric: He has a normal mood and affect. His behavior is normal. Judgment and thought content normal.  Nursing note and vitals reviewed.    ED Treatments / Results  Labs (all labs ordered are listed, but only abnormal results are displayed) Labs Reviewed  BASIC METABOLIC PANEL - Abnormal; Notable for the following:       Result Value   Sodium 132 (*)    Chloride 95 (*)    Glucose, Bld 397 (*)    All other components within normal limits  URINALYSIS, ROUTINE W REFLEX MICROSCOPIC (NOT AT [facility]) - Abnormal; Notable for the following:    Specific Gravity, Urine 1.032 (*)    Glucose, UA >1000 (*)    All other components within normal limits  URINE MICROSCOPIC-ADD ON - Abnormal; Notable for the following:    Squamous Epithelial / LPF 0-5 (*)    All other components within normal limits  CBC WITH DIFFERENTIAL/PLATELET  TROPONIN I  I-STAT TROPOININ, ED    EKG  EKG Interpretation  Date/Time:  Tuesday December 25 2015 16:09:47 EDT Ventricular Rate:  109 PR Interval:    QRS Duration: 97 QT Interval:  355 QTC Calculation: 478 R Axis:   107 Text Interpretation:  Sinus tachycardia Low voltage with right axis deviation Borderline prolonged QT interval Baseline wander in lead(s) III aVL aVF no significant change from previous Confirmed by LITTLE MD, RACHEL [phone]) on 12/25/2015 5:30:31 PM       Radiology Dg Chest 2 View  Result Date: 12/25/2015 CLINICAL DATA:  Right-sided chest pain for 2 days EXAM: CHEST  2 VIEW COMPARISON:  09/05/2015 FINDINGS: Cardiac shadow is within normal limits. Mild  interstitial changes are noted bilaterally. Mild right basilar atelectasis is seen stable from previous exam. No acute abnormality is noted.  IMPRESSION: No active cardiopulmonary disease. Electronically Signed   By: Inez Catalina M.D.   On: 12/25/2015 17:27   Ct Angio Chest Pe W And/or Wo Contrast  Result Date: 12/25/2015 CLINICAL DATA:  Right-sided chest pain and back pain for 1 week. EXAM: CT ANGIOGRAPHY CHEST WITH CONTRAST TECHNIQUE: Multidetector CT imaging of the chest was performed using the standard protocol during bolus administration of intravenous contrast. Multiplanar CT image reconstructions and MIPs were obtained to evaluate the vascular anatomy. CONTRAST:  148 cc of Isovue 370. Initial exam use 68 cc. Repeat study was performed with 80 cc. COMPARISON:  Chest radiograph of 12/25/2015.  CT 05/10/2014. FINDINGS: Cardiovascular: Despite 2 attempts, the bolus is poorly timed, limiting evaluation for pulmonary embolism to the lobar level. No embolism to the lobar level. Aortic atherosclerosis. Mild cardiomegaly. Multivessel coronary artery atherosclerosis. Mediastinum/Nodes: 1.1 cm right paratracheal node is similar to on the prior exam and therefore likely reactive. There is also mild adenopathy within the azygoesophageal recess which is unchanged and presumably reactive. No hilar adenopathy. Prominent left infrahilar nodal tissue is not significantly changed at 1.4 cm on image 41/series 4. Lungs/Pleura: No pleural fluid. Minimal motion degradation. Exam also degraded by patient body habitus. Right base volume loss with subsegmental atelectasis. Minimal centrilobular and mild paraseptal emphysema. Upper Abdomen: Normal imaged portions of the liver, spleen, stomach, adrenal glands Musculoskeletal: Remote left rib trauma. Moderate thoracic spondylosis. Review of the MIP images confirms the above findings. IMPRESSION: 1. Despite 2 attempts, exam is degraded secondary to primarily suboptimal bolus timing.  Secondary limitations include motion and patient body habitus. No embolism to the lobar level. 2.  Coronary artery atherosclerosis. Aortic atherosclerosis. 3. Chronic thoracic adenopathy since 2015, likely reactive. Electronically Signed   By: Abigail Miyamoto M.D.   On: 12/25/2015 19:54    Procedures Procedures (including critical care time)  Medications Ordered in ED Medications  iopamidol (ISOVUE-370) 76 % injection (not administered)  sodium chloride 0.9 % bolus 1,000 mL (not administered)  iopamidol (ISOVUE-370) 76 % injection (not administered)  morphine 4 MG/ML injection 4 mg (4 mg Intravenous Given 12/25/15 1630)  HYDROmorphone (DILAUDID) injection 1 mg (1 mg Intravenous Given 12/25/15 1751)  sodium chloride 0.9 % bolus 500 mL (0 mLs Intravenous Stopped 12/25/15 1923)     Initial Impression / Assessment and Plan / ED Course  I have reviewed the triage vital signs and the nursing notes.  Pertinent labs & imaging results that were available during my care of the patient were reviewed by me and considered in my medical decision making (see chart for details).  Clinical Course    Final Clinical Impressions(s) / ED Diagnoses   Final diagnoses:  Right-sided thoracic back pain   Labs: I-STAT troponin, CBC, BMP, urinalysis  Imaging: CT injure chest PE  Consults:  Therapeutics: Morphine, Dilaudid  Discharge Meds:   Assessment/Plan: 70 year old male presents today with complaints of back and chest pain. Symptoms are reproducible with deep exertion, movement, and palpation. Patient does have pleuritic component to this, with oxygen saturation in the mid to low 90s. Concern for PE in this patient, CT scan is degraded, no embolism in the lobar level. Patient's pain improved with pain medication here in the ED, shortness of breath with deep inspiration. Patient has no infectious etiology. Patient's symptoms are most consistent with musculoskeletal etiology, he's had these symptoms before  which have resolved on the round. Patient will have repeat troponin here in the ED with likely disposition home. Patient care signed  off to oncoming provider pending repeat troponin.   New Prescriptions New Prescriptions   No medications on file     Okey Regal, PA-C 12/25/15 2035    Sharlett Iles, MD 12/25/15 [phone]

## 2015-12-25 NOTE — Progress Notes (Signed)
Subjective:    Patient ID: Joshua Lara, male    DOB: 07-Jan-1946, 70 y.o.   MRN: [id number]  HPI: Joshua Lara is a 70 year old male who arrived to have Percutaneous Electrical Nerve Stimulation #6, when he arrived he was in excruciating pain. Dr. Posey Pronto notified. Bio-wave treatment not performed today. Joshua Lara states on August 13th he had some sneezing episodes, later that night he started having some right rib pain. When he arose on Monday he states his pain became more intense, he took his tramadol and used the lidocaine patch he states no relief was noted. He didn't notified any of his providers of the above. When he arrived to our office while walking he was noticed with labored breathing also tachycardiac heart rate 120 with O2 desaturation Sat's 88%. He denies chest pain or falling.  Vitals were taken several times and documented. Joshua Lara started complaining of being dizzy, tried to obtain orthostatic blood pressure, unable to obtain supine  blood pressure due to increase intensity of pain.  Joshua Lara refused ED evaluation, he called his son Joshua Lara to pick him up, he didn't want to pay the co-pay for an ambulance ride. His pain became more intense with SOB he remained tachycardic, he agree to EMS being called. EMS arrived he was transferred to [facility] ED for evaluation, when his son arrived he was updated on his father's condition. They were heading over to [facility] ED.   Pain Inventory Average Pain 10 Pain Right Now 10 My pain is sharp, burning and tingling  In the last 24 hours, has pain interfered with the following? General activity 1 Relation with others 0 Enjoyment of life 1 What TIME of day is your pain at its worst? evening, night Sleep (in general) Good  Pain is worse with: walking, bending and standing Pain improves with: rest, medication and injections Relief from Meds: 8  Mobility walk without assistance use a cane how many minutes can you  walk? 20 ability to climb steps?  yes do you drive?  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  yes x-rays CT/MRI  Physicians involved in your care Any changes since last visit?  no   Family History  Problem Relation Age of Onset  . Heart attack Father    Social History   Social History  . Marital status: Married    Spouse name: N/A  . Number of children: 2  . Years of education: N/A   Occupational History  . retired    Social History Main Topics  . Smoking status: Former Smoker    Packs/day: 2.00    Years: 30.00    Types: Cigarettes    Quit date: 05/12/1990  . Smokeless tobacco: Never Used  . Alcohol use 0.0 oz/week     Comment: 05/11/2014 "an occasional beer when out w/people; not often"  . Drug use: No  . Sexual activity: No   Other Topics Concern  . None   Social History Narrative  . None   Past Surgical History:  Procedure Laterality Date  . APPENDECTOMY    . CARDIAC CATHETERIZATION  2001; 2009  . CATARACT EXTRACTION W/ INTRAOCULAR LENS IMPLANT Left   . CHOLECYSTECTOMY  1980's   Archie Endo 07/13/2010   . EYE MUSCLE SURGERY Left   . LEFT AND RIGHT HEART CATHETERIZATION WITH CORONARY ANGIOGRAM N/A 05/30/2014  Procedure: LEFT AND RIGHT HEART CATHETERIZATION WITH CORONARY ANGIOGRAM;  Surgeon: Jolaine Artist, MD;  Location: [facility] CATH LAB;  Service: Cardiovascular;  Laterality: N/A;  . MASTOIDECTOMY Right 1970's   Archie Endo 07/13/2010  . SHOULDER OPEN ROTATOR CUFF REPAIR Left 07/2010  . SPHINCTEROTOMY    . TONSILLECTOMY AND ADENOIDECTOMY     Past Medical History:  Diagnosis Date  . Arthritis    "hands, back, ankles" (05/11/2014)  . CHF (congestive heart failure) ([place])   . Chronic lower back pain   . Compression fracture of lumbar vertebra ([place])   . Coronary artery disease   . Daily headache    "recently" (05/11/2014)  . Depression    "I might be slight"  (05/11/2014)  . GERD (gastroesophageal reflux disease)   . High cholesterol   . Hypertension   . Hypothyroid   . OSA treated with BiPAP   . Pneumonia    "couple times; last time was 03/2011" (05/11/2014)  . Type II diabetes mellitus ([facility])    BP 121/78 (BP Location: Left Arm, Patient Position: Supine, Cuff Size: Normal)   Pulse (!) 119   Resp 16   SpO2 91%   Opioid Risk Score:   Fall Risk Score:  `1  Depression screen PHQ 2/9  Depression screen PHQ 2/9 07/12/2015  Decreased Interest 1  Down, Depressed, Hopeless 0  PHQ - 2 Score 1   Review of Systems  Constitutional: Positive for unexpected weight change.  Respiratory: Positive for apnea and shortness of breath.   Cardiovascular:       Limb swelling   Gastrointestinal: Positive for constipation.  Psychiatric/Behavioral: The patient is nervous/anxious.   All other systems reviewed and are negative.     Objective:   Physical Exam  Constitutional: He is oriented to person, place, and time. He appears well-developed and well-nourished.  HENT:  Head: Normocephalic and atraumatic.  Neck: Normal range of motion. Neck supple.  Cardiovascular: Normal rate and regular rhythm.   Pulmonary/Chest: Effort normal and breath sounds normal.  Musculoskeletal:  Normal Muscle Bulk and muscle Testing Reveals:  Upper Extremities: Decreased ROM and Muscle Strength 4/5 Thoracic Paraspinal Hypersensitivity T-7- T-9 Mainly Right Side Lower Extremities: Full ROM and Muscle Strength 5/5 Transported to Stretcher/ Antalgic gait  Neurological: He is alert and oriented to person, place, and time.  Skin: Skin is warm and dry.  Psychiatric: He has a normal mood and affect.  Nursing note and vitals reviewed.     Assessment & Plan:  1. Tachycardic/ SOB: [facility] 2. Mid-Thoracic Pain: [facility] 3. Lumbar spondylosis and degenerative disc disease causing moderate to prominent impingement at L4-5 ; moderate impingement  at L5-S1; and mild impingement at L2-3: Continue Current medication regime and Bio-wave treatment 4. ? Muscle Spasm: Will discontinued  Baclofen at this time, will re-assess after ED evaluation. Robaxin was approved.   60 minutes of face to face patient care time was spent during this visit. All questions were encouraged and answered.

## 2015-12-25 NOTE — Discharge Instructions (Signed)
Please use your home medication as needed for discomfort, please follow-up with your primary care provider for reevaluation and further management of your ongoing symptoms. Please return to the emergency room immediately if you experience any new or worsening signs or symptoms.

## 2015-12-25 NOTE — Progress Notes (Deleted)
   Subjective:    Patient ID: ZAY BONI, male    DOB: 13-Jan-1946, 70 y.o.   MRN: [id number]  HPI  Pain Inventory Average Pain {NUMBERS; 0-10:5044} Pain Right Now {NUMBERS; 0-10:5044} My pain is {PAIN DESCRIPTION:21022940}  In the last 24 hours, has pain interfered with the following? General activity {NUMBERS; 0-10:5044} Relation with others {NUMBERS; 0-10:5044} Enjoyment of life {NUMBERS; 0-10:5044} What TIME of day is your pain at its worst? {TIME OF PJ:6685698 Sleep (in general) {BHH GOOD/FAIR/POOR:22877}  Pain is worse with: {ACTIVITIES:21022942} Pain improves with: {PAIN IMPROVES BW:[id number] Relief from Meds: {NUMBERS; 0-10:5044}  Mobility {MOBILITY WC:843389  Function {FUNCTION:21022946}  Neuro/Psych {NEURO/PSYCH:21022948}  Prior Studies {CPRM PRIOR STUDIES:21022953}  Physicians involved in your care {CPRM PHYSICIANS INVOLVED IN YOUR CARE:21022954}   Family History  Problem Relation Age of Onset  . Heart attack Father    Social History   Social History  . Marital status: Married    Spouse name: N/A  . Number of children: 2  . Years of education: N/A   Occupational History  . retired    Social History Main Topics  . Smoking status: Former Smoker    Packs/day: 2.00    Years: 30.00    Types: Cigarettes    Quit date: 05/12/1990  . Smokeless tobacco: Never Used  . Alcohol use 0.0 oz/week     Comment: 05/11/2014 "an occasional beer when out w/people; not often"  . Drug use: No  . Sexual activity: No   Other Topics Concern  . None   Social History Narrative  . None   Past Surgical History:  Procedure Laterality Date  . APPENDECTOMY    . CARDIAC CATHETERIZATION  2001; 2009  . CATARACT EXTRACTION W/ INTRAOCULAR LENS IMPLANT Left   . CHOLECYSTECTOMY  1980's   Archie Endo 07/13/2010   . EYE MUSCLE SURGERY Left   . LEFT AND RIGHT HEART CATHETERIZATION WITH CORONARY ANGIOGRAM N/A 05/30/2014   Procedure: LEFT AND RIGHT HEART CATHETERIZATION  WITH CORONARY ANGIOGRAM;  Surgeon: Jolaine Artist, MD;  Location: [facility] CATH LAB;  Service: Cardiovascular;  Laterality: N/A;  . MASTOIDECTOMY Right 1970's   Archie Endo 07/13/2010  . SHOULDER OPEN ROTATOR CUFF REPAIR Left 07/2010  . SPHINCTEROTOMY    . TONSILLECTOMY AND ADENOIDECTOMY     Past Medical History:  Diagnosis Date  . Arthritis    "hands, back, ankles" (05/11/2014)  . CHF (congestive heart failure) ([place])   . Chronic lower back pain   . Compression fracture of lumbar vertebra ([place])   . Coronary artery disease   . Daily headache    "recently" (05/11/2014)  . Depression    "I might be slight" (05/11/2014)  . GERD (gastroesophageal reflux disease)   . High cholesterol   . Hypertension   . Hypothyroid   . OSA treated with BiPAP   . Pneumonia    "couple times; last time was 03/2011" (05/11/2014)  . Type II diabetes mellitus ([facility])    BP 121/78 (BP Location: Left Arm, Patient Position: Supine, Cuff Size: Normal)   Pulse (!) 119   Resp 16   SpO2 91%   Opioid Risk Score:   Fall Risk Score:  `1  Depression screen PHQ 2/9  Depression screen PHQ 2/9 07/12/2015  Decreased Interest 1  Down, Depressed, Hopeless 0  PHQ - 2 Score 1    Review of Systems     Objective:   Physical Exam        Assessment & Plan:

## 2015-12-25 NOTE — ED Notes (Signed)
Pt states he doesn't have any change on his pain after all pain medication given, however pt is asleep and looks comfortable on bed, PA to be notified.

## 2015-12-25 NOTE — ED Triage Notes (Signed)
Per EMS, Pt is coming from MD office with complaint of right sided rib pain that started on Sunday. Reports "pulling pain that sharpens with movement." Vitals per EMS: 116/82, 88HR, 18 RR

## 2015-12-27 ENCOUNTER — Encounter (HOSPITAL_BASED_OUTPATIENT_CLINIC_OR_DEPARTMENT_OTHER): Payer: Medicare Other | Admitting: Registered Nurse

## 2015-12-27 ENCOUNTER — Encounter: Payer: Self-pay | Admitting: Registered Nurse

## 2015-12-27 VITALS — BP 103/61 | HR 102 | Resp 16

## 2015-12-27 DIAGNOSIS — M4306 Spondylolysis, lumbar region: Secondary | ICD-10-CM | POA: Diagnosis not present

## 2015-12-27 DIAGNOSIS — M5441 Lumbago with sciatica, right side: Secondary | ICD-10-CM
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
## 2015-12-31 ENCOUNTER — Telehealth: Payer: Self-pay | Admitting: Physical Medicine & Rehabilitation

## 2015-12-31 NOTE — Telephone Encounter (Signed)
Patient was told by Joshua Lara to call our office today about getting an increase on his muscle relaxer.  Please call patient at [phone].

## 2016-01-01 ENCOUNTER — Ambulatory Visit: Payer: Self-pay | Admitting: Registered Nurse

## 2016-01-02 ENCOUNTER — Ambulatory Visit: Payer: Self-pay | Admitting: Physical Medicine & Rehabilitation

## 2016-01-03 ENCOUNTER — Encounter (HOSPITAL_BASED_OUTPATIENT_CLINIC_OR_DEPARTMENT_OTHER): Payer: Medicare Other | Admitting: Physical Medicine & Rehabilitation

## 2016-01-03 ENCOUNTER — Encounter: Payer: Self-pay | Admitting: Physical Medicine & Rehabilitation

## 2016-01-03 VITALS — BP 98/64 | HR 87 | Resp 16

## 2016-01-03 DIAGNOSIS — M545 Low back pain, unspecified: Secondary | ICD-10-CM

## 2016-01-03 DIAGNOSIS — M4306 Spondylolysis, lumbar region: Secondary | ICD-10-CM

## 2016-01-03 DIAGNOSIS — G8929 Other chronic pain: Secondary | ICD-10-CM
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
## 2016-01-07 ENCOUNTER — Telehealth: Payer: Self-pay | Admitting: Registered Nurse

## 2016-01-07 DIAGNOSIS — G4733 Obstructive sleep apnea (adult) (pediatric): Secondary | ICD-10-CM | POA: Diagnosis not present

## 2016-01-07 MED ORDER — BACLOFEN 10 MG PO TABS: ORAL_TABLET | ORAL | 2 refills | Status: DC

## 2016-01-07 NOTE — Telephone Encounter (Signed)
Returned Mr. Hasek call, he has been taking the Baclofen 0.5 mg BID and 10 mg at HS with good results. Baclofen ordered, he verbalizes understanding.

## 2016-01-17 DIAGNOSIS — Z79899 Other long term (current) drug therapy: Secondary | ICD-10-CM | POA: Diagnosis not present

## 2016-01-17 DIAGNOSIS — I2510 Atherosclerotic heart disease of native coronary artery without angina pectoris: Secondary | ICD-10-CM | POA: Diagnosis not present

## 2016-01-17 DIAGNOSIS — E1140 Type 2 diabetes mellitus with diabetic neuropathy, unspecified: Secondary | ICD-10-CM | POA: Diagnosis not present

## 2016-01-17 DIAGNOSIS — E782 Mixed hyperlipidemia: Secondary | ICD-10-CM | POA: Diagnosis not present

## 2016-01-17 DIAGNOSIS — G4733 Obstructive sleep apnea (adult) (pediatric): Secondary | ICD-10-CM | POA: Diagnosis not present

## 2016-01-17 DIAGNOSIS — E039 Hypothyroidism, unspecified: Secondary | ICD-10-CM | POA: Diagnosis not present

## 2016-01-17 DIAGNOSIS — M069 Rheumatoid arthritis, unspecified: Secondary | ICD-10-CM | POA: Diagnosis not present

## 2016-02-19 DIAGNOSIS — G4733 Obstructive sleep apnea (adult) (pediatric): Secondary | ICD-10-CM | POA: Diagnosis not present

## 2016-02-20 DIAGNOSIS — E1165 Type 2 diabetes mellitus with hyperglycemia: Secondary | ICD-10-CM | POA: Diagnosis not present

## 2016-02-20 DIAGNOSIS — Z23 Encounter for immunization: Secondary | ICD-10-CM | POA: Diagnosis not present

## 2016-02-20 DIAGNOSIS — Z6838 Body mass index (BMI) 38.0-38.9, adult: Secondary | ICD-10-CM | POA: Diagnosis not present

## 2016-03-06 ENCOUNTER — Ambulatory Visit: Payer: Self-pay | Admitting: Physical Medicine & Rehabilitation

## 2016-03-07 ENCOUNTER — Other Ambulatory Visit (HOSPITAL_BASED_OUTPATIENT_CLINIC_OR_DEPARTMENT_OTHER): Payer: Self-pay

## 2016-03-07 DIAGNOSIS — G4730 Sleep apnea, unspecified: Secondary | ICD-10-CM

## 2016-03-07 DIAGNOSIS — R0683 Snoring: Secondary | ICD-10-CM

## 2016-03-13 ENCOUNTER — Encounter: Payer: Medicare Other | Attending: Registered Nurse | Admitting: Physical Medicine & Rehabilitation

## 2016-03-13 DIAGNOSIS — M545 Low back pain: Secondary | ICD-10-CM | POA: Insufficient documentation

## 2016-03-13 DIAGNOSIS — Z7984 Long term (current) use of oral hypoglycemic drugs: Secondary | ICD-10-CM | POA: Insufficient documentation

## 2016-03-13 DIAGNOSIS — I509 Heart failure, unspecified: Secondary | ICD-10-CM | POA: Insufficient documentation

## 2016-03-13 DIAGNOSIS — I110 Hypertensive heart disease with heart failure: Secondary | ICD-10-CM | POA: Insufficient documentation

## 2016-03-13 DIAGNOSIS — M4856XA Collapsed vertebra, not elsewhere classified, lumbar region, initial encounter for fracture: Secondary | ICD-10-CM | POA: Insufficient documentation

## 2016-03-13 DIAGNOSIS — Z7982 Long term (current) use of aspirin: Secondary | ICD-10-CM | POA: Insufficient documentation

## 2016-03-13 DIAGNOSIS — F329 Major depressive disorder, single episode, unspecified: Secondary | ICD-10-CM | POA: Insufficient documentation

## 2016-03-13 DIAGNOSIS — M1990 Unspecified osteoarthritis, unspecified site: Secondary | ICD-10-CM | POA: Insufficient documentation

## 2016-03-13 DIAGNOSIS — E118 Type 2 diabetes mellitus with unspecified complications: Secondary | ICD-10-CM | POA: Insufficient documentation

## 2016-03-18 DIAGNOSIS — E119 Type 2 diabetes mellitus without complications: Secondary | ICD-10-CM | POA: Diagnosis not present

## 2016-03-18 DIAGNOSIS — R05 Cough: Secondary | ICD-10-CM | POA: Diagnosis not present

## 2016-03-18 DIAGNOSIS — Z6838 Body mass index (BMI) 38.0-38.9, adult: Secondary | ICD-10-CM | POA: Diagnosis not present

## 2016-04-02 ENCOUNTER — Telehealth: Payer: Self-pay | Admitting: Registered Nurse
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
## 2016-04-07 ENCOUNTER — Ambulatory Visit (HOSPITAL_BASED_OUTPATIENT_CLINIC_OR_DEPARTMENT_OTHER): Payer: Medicare Other | Attending: Physician Assistant | Admitting: Internal Medicine

## 2016-04-07 DIAGNOSIS — G4733 Obstructive sleep apnea (adult) (pediatric): Secondary | ICD-10-CM | POA: Insufficient documentation

## 2016-04-07 DIAGNOSIS — G4730 Sleep apnea, unspecified: Secondary | ICD-10-CM

## 2016-04-07 DIAGNOSIS — I493 Ventricular premature depolarization: Secondary | ICD-10-CM | POA: Diagnosis not present

## 2016-04-07 DIAGNOSIS — R0683 Snoring: Secondary | ICD-10-CM | POA: Insufficient documentation

## 2016-04-07 NOTE — Telephone Encounter (Signed)
Return Mr. Darty call, he will picked up the letter on Wednesday, on 04/04/16

## 2016-04-08 ENCOUNTER — Telehealth: Payer: Self-pay | Admitting: Registered Nurse

## 2016-04-08 NOTE — Telephone Encounter (Signed)
Letter printed for [employer] in regards to 12/25/2015 office visit. Mr. Lovinger will pick up letter on 04/09/2016

## 2016-04-09 DIAGNOSIS — L578 Other skin changes due to chronic exposure to nonionizing radiation: Secondary | ICD-10-CM | POA: Diagnosis not present

## 2016-04-09 DIAGNOSIS — L300 Nummular dermatitis: Secondary | ICD-10-CM | POA: Diagnosis not present

## 2016-04-09 DIAGNOSIS — L821 Other seborrheic keratosis: Secondary | ICD-10-CM | POA: Diagnosis not present

## 2016-04-09 DIAGNOSIS — I8310 Varicose veins of unspecified lower extremity with inflammation: Secondary | ICD-10-CM | POA: Diagnosis not present

## 2016-04-12 NOTE — Procedures (Signed)
   Patient Name: Joshua Lara, Joshua Lara Date: 04/07/2016 Gender: Male D.O.B: 04-26-1946 Age (years): 70 Referring Provider: Cyndi Bender Height (inches): 70 Interpreting Physician: Baird Lyons MD, ABSM Weight (lbs): 260 RPSGT: Carolin Coy BMI: 37 MRN: [id number] Neck Size: 20.50 CLINICAL INFORMATION The patient is referred for a BiPAP titration to treat sleep apnea.   Date of NPSG, Split Night or HST:  Diagnostic HST 02/11/16 AHI 46.4/ hr with desat to 77%  SLEEP STUDY TECHNIQUE As per the AASM Manual for the Scoring of Sleep and Associated Events v2.3 (April 2016) with a hypopnea requiring 4% desaturations. The channels recorded and monitored were frontal, central and occipital EEG, electrooculogram (EOG), submentalis EMG (chin), nasal and oral airflow, thoracic and abdominal wall motion, anterior tibialis EMG, snore microphone, electrocardiogram, and pulse oximetry. Bilevel positive airway pressure (BPAP) was initiated at the beginning of the study and titrated to treat sleep-disordered breathing.  MEDICATIONS Medications self-administered by patient taken the night of the study : charted for review  RESPIRATORY PARAMETERS Optimal IPAP Pressure (cm): 20 AHI at Optimal Pressure (/hr) 2.3 Optimal EPAP Pressure (cm): 12   Overall Minimal O2 (%): 83.00 Minimal O2 at Optimal Pressure (%): 89.0 SLEEP ARCHITECTURE Start Time: 10:08:56 PM Stop Time: 4:49:44 AM Total Time (min): 400.8 Total Sleep Time (min): 363.0 Sleep Latency (min): 8.7 Sleep Efficiency (%): 90.6 REM Latency (min): 245.5 WASO (min): 29.1 Stage N1 (%): 18.60 Stage N2 (%): 73.14 Stage N3 (%): 0.00 Stage R (%): 8.26 Supine (%): 27.46 Arousal Index (/hr): 12.4      CARDIAC DATA The 2 lead EKG demonstrated sinus rhythm. The mean heart rate was 82.44 beats per minute. Other EKG findings include: PVCs.  LEG MOVEMENT DATA The total Periodic Limb Movements of Sleep (PLMS) were 349. The PLMS index was 57.69. A PLMS  index of <15 is considered normal in adults. IMPRESSIONS - An optimal BiPAP pressure was selected for this patient ( 20 / 12 cm of water) - Central sleep apnea was not noted during this titration (CAI = 2.8/h). - Moderete oxygen desaturations were observed during this titration (min O2 = 83.00%). - The patient snored with Soft snoring volume. - 2-lead EKG demonstrated: PVCs - Severe periodic limb movements were observed during this study before therapeutic BIPAP presures were achieved, probably reflecting respiratory arousals. Arousals associated with PLMs were rare.  DIAGNOSIS - Obstructive Sleep Apnea (327.23 [G47.33 ICD-10])  RECOMMENDATIONS - Trial of BiPAP therapy on 20/12 cm H2O with a Medium size Fisher[employer]Paykel Full Face Mask Simplus mask and heated humidification. - Avoid alcohol, sedatives and other CNS depressants that may worsen sleep apnea and disrupt normal sleep architecture. - Sleep hygiene should be reviewed to assess factors that may improve sleep quality. - Weight management and regular exercise should be initiated or continued.  [Electronically signed] 04/12/2016 11:39 AM  Baird Lyons MD, ABSM Diplomate, American Board of Sleep Medicine   NPI: [id number]  [place], American Board of Sleep Medicine  ELECTRONICALLY SIGNED ON:  04/12/2016, 11:34 AM [place] PH: [phone]   FX: [phone] [place]

## 2016-05-01 ENCOUNTER — Encounter: Payer: Self-pay | Admitting: Physical Medicine & Rehabilitation

## 2016-05-01 ENCOUNTER — Encounter: Payer: Medicare Other | Attending: Registered Nurse | Admitting: Physical Medicine & Rehabilitation

## 2016-05-01 VITALS — BP 110/65 | HR 80 | Resp 14

## 2016-05-01 DIAGNOSIS — Z7982 Long term (current) use of aspirin: Secondary | ICD-10-CM | POA: Diagnosis not present

## 2016-05-01 DIAGNOSIS — F329 Major depressive disorder, single episode, unspecified: Secondary | ICD-10-CM | POA: Diagnosis not present

## 2016-05-01 DIAGNOSIS — I509 Heart failure, unspecified: Secondary | ICD-10-CM | POA: Diagnosis not present

## 2016-05-01 DIAGNOSIS — G894 Chronic pain syndrome: Secondary | ICD-10-CM | POA: Diagnosis not present

## 2016-05-01 DIAGNOSIS — I110 Hypertensive heart disease with heart failure: Secondary | ICD-10-CM | POA: Insufficient documentation

## 2016-05-01 DIAGNOSIS — I5032 Chronic diastolic (congestive) heart failure: Secondary | ICD-10-CM

## 2016-05-01 DIAGNOSIS — E118 Type 2 diabetes mellitus with unspecified complications: Secondary | ICD-10-CM | POA: Insufficient documentation

## 2016-05-01 DIAGNOSIS — M545 Low back pain: Secondary | ICD-10-CM

## 2016-05-01 DIAGNOSIS — G8929 Other chronic pain: Secondary | ICD-10-CM | POA: Diagnosis not present

## 2016-05-01 DIAGNOSIS — M62830 Muscle spasm of back: Secondary | ICD-10-CM | POA: Diagnosis not present

## 2016-05-01 DIAGNOSIS — M4856XA Collapsed vertebra, not elsewhere classified, lumbar region, initial encounter for fracture: Secondary | ICD-10-CM | POA: Insufficient documentation

## 2016-05-01 DIAGNOSIS — Z7984 Long term (current) use of oral hypoglycemic drugs: Secondary | ICD-10-CM | POA: Diagnosis not present

## 2016-05-01 DIAGNOSIS — M1990 Unspecified osteoarthritis, unspecified site: Secondary | ICD-10-CM | POA: Diagnosis not present
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
## 2016-05-13 DIAGNOSIS — I8310 Varicose veins of unspecified lower extremity with inflammation: Secondary | ICD-10-CM | POA: Diagnosis not present

## 2016-05-13 DIAGNOSIS — R600 Localized edema: Secondary | ICD-10-CM | POA: Diagnosis not present

## 2016-05-15 DIAGNOSIS — H90A21 Sensorineural hearing loss, unilateral, right ear, with restricted hearing on the contralateral side: Secondary | ICD-10-CM | POA: Diagnosis not present

## 2016-05-15 DIAGNOSIS — H90A32 Mixed conductive and sensorineural hearing loss, unilateral, left ear with restricted hearing on the contralateral side: Secondary | ICD-10-CM | POA: Diagnosis not present

## 2016-05-15 DIAGNOSIS — H906 Mixed conductive and sensorineural hearing loss, bilateral: Secondary | ICD-10-CM | POA: Diagnosis not present

## 2016-05-20 DIAGNOSIS — Z6837 Body mass index (BMI) 37.0-37.9, adult: Secondary | ICD-10-CM | POA: Diagnosis not present

## 2016-05-20 DIAGNOSIS — I10 Essential (primary) hypertension: Secondary | ICD-10-CM | POA: Diagnosis not present

## 2016-05-20 DIAGNOSIS — J069 Acute upper respiratory infection, unspecified: Secondary | ICD-10-CM | POA: Diagnosis not present

## 2016-05-20 DIAGNOSIS — E119 Type 2 diabetes mellitus without complications: Secondary | ICD-10-CM | POA: Diagnosis not present

## 2016-05-20 DIAGNOSIS — E039 Hypothyroidism, unspecified: Secondary | ICD-10-CM | POA: Diagnosis not present

## 2016-05-20 DIAGNOSIS — E782 Mixed hyperlipidemia: Secondary | ICD-10-CM | POA: Diagnosis not present

## 2016-05-20 DIAGNOSIS — I2510 Atherosclerotic heart disease of native coronary artery without angina pectoris: Secondary | ICD-10-CM | POA: Diagnosis not present

## 2016-05-27 ENCOUNTER — Other Ambulatory Visit (HOSPITAL_BASED_OUTPATIENT_CLINIC_OR_DEPARTMENT_OTHER): Payer: Self-pay

## 2016-05-27 DIAGNOSIS — G4710 Hypersomnia, unspecified: Secondary | ICD-10-CM

## 2016-05-27 DIAGNOSIS — R0683 Snoring: Secondary | ICD-10-CM

## 2016-05-27 DIAGNOSIS — G4730 Sleep apnea, unspecified: Secondary | ICD-10-CM

## 2016-06-02 DIAGNOSIS — Z6837 Body mass index (BMI) 37.0-37.9, adult: Secondary | ICD-10-CM | POA: Diagnosis not present

## 2016-06-02 DIAGNOSIS — J101 Influenza due to other identified influenza virus with other respiratory manifestations: Secondary | ICD-10-CM | POA: Diagnosis not present

## 2016-06-16 DIAGNOSIS — H401134 Primary open-angle glaucoma, bilateral, indeterminate stage: Secondary | ICD-10-CM | POA: Diagnosis not present

## 2016-06-16 DIAGNOSIS — Z961 Presence of intraocular lens: Secondary | ICD-10-CM | POA: Diagnosis not present

## 2016-07-10 DIAGNOSIS — R05 Cough: Secondary | ICD-10-CM | POA: Diagnosis not present

## 2016-07-10 DIAGNOSIS — I2510 Atherosclerotic heart disease of native coronary artery without angina pectoris: Secondary | ICD-10-CM | POA: Diagnosis not present

## 2016-07-10 DIAGNOSIS — I110 Hypertensive heart disease with heart failure: Secondary | ICD-10-CM | POA: Diagnosis not present

## 2016-07-10 DIAGNOSIS — I5031 Acute diastolic (congestive) heart failure: Secondary | ICD-10-CM | POA: Diagnosis not present

## 2016-07-10 DIAGNOSIS — J302 Other seasonal allergic rhinitis: Secondary | ICD-10-CM | POA: Diagnosis not present

## 2016-07-10 DIAGNOSIS — E1136 Type 2 diabetes mellitus with diabetic cataract: Secondary | ICD-10-CM | POA: Diagnosis not present

## 2016-07-10 DIAGNOSIS — Z87891 Personal history of nicotine dependence: Secondary | ICD-10-CM | POA: Diagnosis not present

## 2016-07-10 DIAGNOSIS — Z794 Long term (current) use of insulin: Secondary | ICD-10-CM | POA: Diagnosis not present

## 2016-07-10 DIAGNOSIS — Z7982 Long term (current) use of aspirin: Secondary | ICD-10-CM | POA: Diagnosis not present

## 2016-07-10 DIAGNOSIS — R938 Abnormal findings on diagnostic imaging of other specified body structures: Secondary | ICD-10-CM | POA: Diagnosis not present

## 2016-07-10 DIAGNOSIS — J432 Centrilobular emphysema: Secondary | ICD-10-CM | POA: Diagnosis not present

## 2016-07-10 DIAGNOSIS — E039 Hypothyroidism, unspecified: Secondary | ICD-10-CM | POA: Diagnosis not present

## 2016-07-10 DIAGNOSIS — E785 Hyperlipidemia, unspecified: Secondary | ICD-10-CM | POA: Diagnosis not present

## 2016-07-10 DIAGNOSIS — Z79899 Other long term (current) drug therapy: Secondary | ICD-10-CM | POA: Diagnosis not present

## 2016-07-14 ENCOUNTER — Ambulatory Visit (HOSPITAL_BASED_OUTPATIENT_CLINIC_OR_DEPARTMENT_OTHER): Payer: Self-pay

## 2016-07-14 ENCOUNTER — Ambulatory Visit (HOSPITAL_BASED_OUTPATIENT_CLINIC_OR_DEPARTMENT_OTHER): Payer: Medicare Other | Attending: Physician Assistant | Admitting: Internal Medicine

## 2016-07-14 DIAGNOSIS — G4730 Sleep apnea, unspecified: Secondary | ICD-10-CM

## 2016-07-14 DIAGNOSIS — G4710 Hypersomnia, unspecified: Secondary | ICD-10-CM

## 2016-07-14 DIAGNOSIS — R0683 Snoring: Secondary | ICD-10-CM

## 2016-07-14 DIAGNOSIS — Z7982 Long term (current) use of aspirin: Secondary | ICD-10-CM | POA: Insufficient documentation

## 2016-07-14 DIAGNOSIS — I493 Ventricular premature depolarization: Secondary | ICD-10-CM | POA: Diagnosis not present

## 2016-07-14 DIAGNOSIS — Z79899 Other long term (current) drug therapy: Secondary | ICD-10-CM | POA: Insufficient documentation

## 2016-07-14 DIAGNOSIS — G4733 Obstructive sleep apnea (adult) (pediatric): Secondary | ICD-10-CM | POA: Diagnosis not present

## 2016-07-19 DIAGNOSIS — G4730 Sleep apnea, unspecified: Secondary | ICD-10-CM | POA: Diagnosis not present

## 2016-07-19 NOTE — Procedures (Signed)
   Patient Name: Joshua Lara, Joshua Lara Date: 07/14/2016 Gender: Male D.O.B: 1945-08-18 Age (years): 70 Referring Provider: Cyndi Bender Height (inches): 42 Interpreting Physician: Baird Lyons MD, ABSM Weight (lbs): 248 RPSGT: Laren Everts BMI: 36 MRN: [id number] Neck Size: 20.50 CLINICAL INFORMATION The patient is referred for a BiPAP titration to treat sleep apnea.     Date of NPSG, Split Night or HST:  Unattended HST 02/11/16  AHI 46.4/ hr, desaturation to 77%  SLEEP STUDY TECHNIQUE As per the AASM Manual for the Scoring of Sleep and Associated Events v2.3 (April 2016) with a hypopnea requiring 4% desaturations.  The channels recorded and monitored were frontal, central and occipital EEG, electrooculogram (EOG), submentalis EMG (chin), nasal and oral airflow, thoracic and abdominal wall motion, anterior tibialis EMG, snore microphone, electrocardiogram, and pulse oximetry. Bilevel positive airway pressure (BPAP) was initiated at the beginning of the study and titrated to treat sleep-disordered breathing.  MEDICATIONS Medications self-administered by patient taken the night of the study : ASPIRIN, COENZYME Q10, GEMFIBROZIL, POTASSIUIM CHLORIDE, JANUMET  RESPIRATORY PARAMETERS Optimal IPAP Pressure (cm): 17 AHI at Optimal Pressure (/hr) 0.0 Optimal EPAP Pressure (cm): 12   Overall Minimal O2 (%): 84.00 Minimal O2 at Optimal Pressure (%): 89.0  SLEEP ARCHITECTURE Start Time: 10:29:23 PM Stop Time: 5:16:28 AM Total Time (min): 407.1 Total Sleep Time (min): 350.5 Sleep Latency (min): 1.2 Sleep Efficiency (%): 86.1 REM Latency (min): 288.5 WASO (min): 55.3 Stage N1 (%): 14.69 Stage N2 (%): 78.17 Stage N3 (%): 0.00 Stage R (%): 7.13 Supine (%): 70.19 Arousal Index (/hr): 23.8     CARDIAC DATA The 2 lead EKG demonstrated sinus rhythm. The mean heart rate was 73.48 beats per minute. Other EKG findings include: PVCs.  LEG MOVEMENT DATA The total Periodic Limb Movements of  Sleep (PLMS) were 92. The PLMS index was 15.75. A PLMS index of <15 is considered normal in adults.  IMPRESSIONS - CPAP control was inadequate. An optimal BiAP pressure was selected for this patient ( 17 /12 cm of water) - Central sleep apnea was not noted during this titration (CAI = 2.7/h). - Moderete oxygen desaturations were observed during this titration (min O2 = 84.00%). - No snoring was audible during this study. - 2-lead EKG demonstrated: PVCs - Mild periodic limb movements were observed during this study. Arousals associated with PLMs were rare.  DIAGNOSIS - Obstructive Sleep Apnea (327.23 [G47.33 ICD-10])  RECOMMENDATIONS - Trial of BiPAP therapy on 17/12 cm H2O with a Large size Resmed Full Face Mask Mirage Quattro mask and heated humidification. - Avoid alcohol, sedatives and other CNS depressants that may worsen sleep apnea and disrupt normal sleep architecture. - Sleep hygiene should be reviewed to assess factors that may improve sleep quality. - Weight management and regular exercise should be initiated or continued.  [Electronically signed] 07/19/2016 02:13 PM  Baird Lyons MD, [place], American Board of Sleep Medicine   NPI: [id number]  [place], American Board of Sleep Medicine  ELECTRONICALLY SIGNED ON:  07/19/2016, 2:10 PM [place] PH: [phone]   FX: [phone] [place]

## 2016-07-30 ENCOUNTER — Encounter (HOSPITAL_BASED_OUTPATIENT_CLINIC_OR_DEPARTMENT_OTHER): Payer: Self-pay

## 2016-07-31 DIAGNOSIS — M25561 Pain in right knee: Secondary | ICD-10-CM | POA: Diagnosis not present

## 2016-08-26 DIAGNOSIS — E1165 Type 2 diabetes mellitus with hyperglycemia: Secondary | ICD-10-CM | POA: Diagnosis not present

## 2016-08-26 DIAGNOSIS — Z1389 Encounter for screening for other disorder: Secondary | ICD-10-CM | POA: Diagnosis not present

## 2016-08-26 DIAGNOSIS — I10 Essential (primary) hypertension: Secondary | ICD-10-CM | POA: Diagnosis not present

## 2016-08-26 DIAGNOSIS — F419 Anxiety disorder, unspecified: Secondary | ICD-10-CM | POA: Diagnosis not present

## 2016-08-26 DIAGNOSIS — Z6837 Body mass index (BMI) 37.0-37.9, adult: Secondary | ICD-10-CM | POA: Diagnosis not present

## 2016-08-26 DIAGNOSIS — G4733 Obstructive sleep apnea (adult) (pediatric): Secondary | ICD-10-CM | POA: Diagnosis not present

## 2016-08-26 DIAGNOSIS — K219 Gastro-esophageal reflux disease without esophagitis: Secondary | ICD-10-CM | POA: Diagnosis not present

## 2016-08-26 DIAGNOSIS — E782 Mixed hyperlipidemia: Secondary | ICD-10-CM | POA: Diagnosis not present

## 2016-08-26 DIAGNOSIS — Z125 Encounter for screening for malignant neoplasm of prostate: Secondary | ICD-10-CM | POA: Diagnosis not present

## 2016-08-28 DIAGNOSIS — I119 Hypertensive heart disease without heart failure: Secondary | ICD-10-CM | POA: Diagnosis not present

## 2016-08-28 DIAGNOSIS — E1165 Type 2 diabetes mellitus with hyperglycemia: Secondary | ICD-10-CM | POA: Diagnosis not present

## 2016-08-28 DIAGNOSIS — E662 Morbid (severe) obesity with alveolar hypoventilation: Secondary | ICD-10-CM | POA: Diagnosis not present

## 2016-08-28 DIAGNOSIS — G4733 Obstructive sleep apnea (adult) (pediatric): Secondary | ICD-10-CM | POA: Diagnosis not present

## 2016-09-30 DIAGNOSIS — R0781 Pleurodynia: Secondary | ICD-10-CM | POA: Diagnosis not present

## 2016-10-10 DIAGNOSIS — L821 Other seborrheic keratosis: Secondary | ICD-10-CM | POA: Diagnosis not present

## 2016-10-10 DIAGNOSIS — L918 Other hypertrophic disorders of the skin: Secondary | ICD-10-CM | POA: Diagnosis not present

## 2016-10-16 DIAGNOSIS — R42 Dizziness and giddiness: Secondary | ICD-10-CM | POA: Diagnosis not present

## 2016-10-16 DIAGNOSIS — I10 Essential (primary) hypertension: Secondary | ICD-10-CM | POA: Diagnosis not present

## 2016-10-16 DIAGNOSIS — Z6837 Body mass index (BMI) 37.0-37.9, adult: Secondary | ICD-10-CM | POA: Diagnosis not present

## 2016-10-16 DIAGNOSIS — E1165 Type 2 diabetes mellitus with hyperglycemia: Secondary | ICD-10-CM | POA: Diagnosis not present

## 2016-10-16 DIAGNOSIS — G4733 Obstructive sleep apnea (adult) (pediatric): Secondary | ICD-10-CM | POA: Diagnosis not present

## 2016-10-16 DIAGNOSIS — I5030 Unspecified diastolic (congestive) heart failure: Secondary | ICD-10-CM | POA: Diagnosis not present

## 2016-10-28 DIAGNOSIS — Z1389 Encounter for screening for other disorder: Secondary | ICD-10-CM | POA: Diagnosis not present

## 2016-10-28 DIAGNOSIS — Z136 Encounter for screening for cardiovascular disorders: Secondary | ICD-10-CM | POA: Diagnosis not present

## 2016-10-28 DIAGNOSIS — Z6837 Body mass index (BMI) 37.0-37.9, adult: Secondary | ICD-10-CM | POA: Diagnosis not present

## 2016-10-28 DIAGNOSIS — Z0000 Encounter for general adult medical examination without abnormal findings: Secondary | ICD-10-CM | POA: Diagnosis not present

## 2016-10-28 DIAGNOSIS — Z9181 History of falling: Secondary | ICD-10-CM | POA: Diagnosis not present

## 2016-10-28 DIAGNOSIS — Z125 Encounter for screening for malignant neoplasm of prostate: Secondary | ICD-10-CM | POA: Diagnosis not present

## 2016-10-28 DIAGNOSIS — E785 Hyperlipidemia, unspecified: Secondary | ICD-10-CM | POA: Diagnosis not present

## 2016-10-30 ENCOUNTER — Encounter: Payer: Medicare Other | Attending: Physical Medicine & Rehabilitation | Admitting: Physical Medicine & Rehabilitation

## 2016-10-30 ENCOUNTER — Encounter: Payer: Self-pay | Admitting: Physical Medicine & Rehabilitation

## 2016-10-30 VITALS — BP 106/71 | HR 92

## 2016-10-30 DIAGNOSIS — E119 Type 2 diabetes mellitus without complications: Secondary | ICD-10-CM | POA: Insufficient documentation

## 2016-10-30 DIAGNOSIS — R200 Anesthesia of skin: Secondary | ICD-10-CM | POA: Insufficient documentation

## 2016-10-30 DIAGNOSIS — M545 Low back pain, unspecified: Secondary | ICD-10-CM

## 2016-10-30 DIAGNOSIS — G4733 Obstructive sleep apnea (adult) (pediatric): Secondary | ICD-10-CM | POA: Diagnosis not present

## 2016-10-30 DIAGNOSIS — G894 Chronic pain syndrome: Secondary | ICD-10-CM

## 2016-10-30 DIAGNOSIS — M5136 Other intervertebral disc degeneration, lumbar region: Secondary | ICD-10-CM | POA: Diagnosis not present

## 2016-10-30 DIAGNOSIS — M62830 Muscle spasm of back: Secondary | ICD-10-CM

## 2016-10-30 DIAGNOSIS — Z87891 Personal history of nicotine dependence: Secondary | ICD-10-CM | POA: Insufficient documentation

## 2016-10-30 DIAGNOSIS — M25552 Pain in left hip: Secondary | ICD-10-CM | POA: Insufficient documentation

## 2016-10-30 DIAGNOSIS — G8929 Other chronic pain: Secondary | ICD-10-CM

## 2016-10-30 DIAGNOSIS — I110 Hypertensive heart disease with heart failure: Secondary | ICD-10-CM | POA: Diagnosis not present

## 2016-10-30 DIAGNOSIS — K219 Gastro-esophageal reflux disease without esophagitis: Secondary | ICD-10-CM | POA: Diagnosis not present

## 2016-10-30 DIAGNOSIS — F329 Major depressive disorder, single episode, unspecified: Secondary | ICD-10-CM | POA: Diagnosis not present

## 2016-10-30 DIAGNOSIS — I509 Heart failure, unspecified: Secondary | ICD-10-CM | POA: Insufficient documentation

## 2016-10-30 DIAGNOSIS — M5137 Other intervertebral disc degeneration, lumbosacral region: Secondary | ICD-10-CM | POA: Diagnosis not present

## 2016-10-30 DIAGNOSIS — M47816 Spondylosis without myelopathy or radiculopathy, lumbar region: Secondary | ICD-10-CM | POA: Insufficient documentation

## 2016-10-30 DIAGNOSIS — M4856XA Collapsed vertebra, not elsewhere classified, lumbar region, initial encounter for fracture: Secondary | ICD-10-CM | POA: Insufficient documentation

## 2016-10-30 DIAGNOSIS — E039 Hypothyroidism, unspecified: Secondary | ICD-10-CM | POA: Insufficient documentation

## 2016-10-30 DIAGNOSIS — M4306 Spondylolysis, lumbar region: Secondary | ICD-10-CM | POA: Diagnosis not present

## 2016-10-30 DIAGNOSIS — E7800 Pure hypercholesterolemia, unspecified: Secondary | ICD-10-CM | POA: Diagnosis not present
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
## 2016-11-03 ENCOUNTER — Inpatient Hospital Stay (HOSPITAL_COMMUNITY)
Admission: EM | Admit: 2016-11-03 | Discharge: 2016-11-06 | DRG: 915 | Disposition: A | Discharge status: Home / Self Care | Payer: Medicare Other | Attending: Internal Medicine | Admitting: Internal Medicine

## 2016-11-03 ENCOUNTER — Encounter (HOSPITAL_COMMUNITY): Payer: Self-pay | Admitting: Pharmacy Technician

## 2016-11-03 DIAGNOSIS — R220 Localized swelling, mass and lump, head: Secondary | ICD-10-CM | POA: Diagnosis not present

## 2016-11-03 DIAGNOSIS — H409 Unspecified glaucoma: Secondary | ICD-10-CM | POA: Diagnosis present

## 2016-11-03 DIAGNOSIS — Z66 Do not resuscitate: Secondary | ICD-10-CM | POA: Diagnosis present

## 2016-11-03 DIAGNOSIS — I2510 Atherosclerotic heart disease of native coronary artery without angina pectoris: Secondary | ICD-10-CM | POA: Diagnosis present

## 2016-11-03 DIAGNOSIS — T370X5A Adverse effect of sulfonamides, initial encounter: Secondary | ICD-10-CM | POA: Diagnosis present

## 2016-11-03 DIAGNOSIS — T7840XA Allergy, unspecified, initial encounter: Secondary | ICD-10-CM | POA: Diagnosis not present

## 2016-11-03 DIAGNOSIS — E119 Type 2 diabetes mellitus without complications: Secondary | ICD-10-CM | POA: Diagnosis present

## 2016-11-03 DIAGNOSIS — T783XXA Angioneurotic edema, initial encounter: Secondary | ICD-10-CM | POA: Diagnosis not present

## 2016-11-03 DIAGNOSIS — E039 Hypothyroidism, unspecified: Secondary | ICD-10-CM | POA: Diagnosis present

## 2016-11-03 DIAGNOSIS — Z79899 Other long term (current) drug therapy: Secondary | ICD-10-CM

## 2016-11-03 DIAGNOSIS — R0603 Acute respiratory distress: Secondary | ICD-10-CM | POA: Diagnosis not present

## 2016-11-03 DIAGNOSIS — Z6836 Body mass index (BMI) 36.0-36.9, adult: Secondary | ICD-10-CM

## 2016-11-03 DIAGNOSIS — M545 Low back pain: Secondary | ICD-10-CM | POA: Diagnosis present

## 2016-11-03 DIAGNOSIS — I110 Hypertensive heart disease with heart failure: Secondary | ICD-10-CM | POA: Diagnosis not present

## 2016-11-03 DIAGNOSIS — Z4682 Encounter for fitting and adjustment of non-vascular catheter: Secondary | ICD-10-CM | POA: Diagnosis not present

## 2016-11-03 DIAGNOSIS — I959 Hypotension, unspecified: Secondary | ICD-10-CM | POA: Diagnosis present

## 2016-11-03 DIAGNOSIS — Z961 Presence of intraocular lens: Secondary | ICD-10-CM | POA: Diagnosis present

## 2016-11-03 DIAGNOSIS — E876 Hypokalemia: Secondary | ICD-10-CM | POA: Diagnosis present

## 2016-11-03 DIAGNOSIS — E669 Obesity, unspecified: Secondary | ICD-10-CM | POA: Diagnosis present

## 2016-11-03 DIAGNOSIS — K219 Gastro-esophageal reflux disease without esophagitis: Secondary | ICD-10-CM | POA: Diagnosis present

## 2016-11-03 DIAGNOSIS — G4733 Obstructive sleep apnea (adult) (pediatric): Secondary | ICD-10-CM | POA: Diagnosis present

## 2016-11-03 DIAGNOSIS — E7800 Pure hypercholesterolemia, unspecified: Secondary | ICD-10-CM | POA: Diagnosis present

## 2016-11-03 DIAGNOSIS — L299 Pruritus, unspecified: Secondary | ICD-10-CM | POA: Diagnosis not present

## 2016-11-03 DIAGNOSIS — J9600 Acute respiratory failure, unspecified whether with hypoxia or hypercapnia: Secondary | ICD-10-CM

## 2016-11-03 DIAGNOSIS — M1990 Unspecified osteoarthritis, unspecified site: Secondary | ICD-10-CM | POA: Diagnosis present

## 2016-11-03 DIAGNOSIS — Z9842 Cataract extraction status, left eye: Secondary | ICD-10-CM

## 2016-11-03 DIAGNOSIS — I509 Heart failure, unspecified: Secondary | ICD-10-CM | POA: Diagnosis not present

## 2016-11-03 DIAGNOSIS — F329 Major depressive disorder, single episode, unspecified: Secondary | ICD-10-CM | POA: Diagnosis present

## 2016-11-03 DIAGNOSIS — Z7982 Long term (current) use of aspirin: Secondary | ICD-10-CM

## 2016-11-03 DIAGNOSIS — Z882 Allergy status to sulfonamides status: Secondary | ICD-10-CM

## 2016-11-03 DIAGNOSIS — G8929 Other chronic pain: Secondary | ICD-10-CM | POA: Diagnosis present

## 2016-11-03 DIAGNOSIS — Y92009 Unspecified place in unspecified non-institutional (private) residence as the place of occurrence of the external cause: Secondary | ICD-10-CM

## 2016-11-03 DIAGNOSIS — Z888 Allergy status to other drugs, medicaments and biological substances status: Secondary | ICD-10-CM

## 2016-11-03 DIAGNOSIS — E878 Other disorders of electrolyte and fluid balance, not elsewhere classified: Secondary | ICD-10-CM | POA: Diagnosis present

## 2016-11-03 DIAGNOSIS — Z87891 Personal history of nicotine dependence: Secondary | ICD-10-CM

## 2016-11-03 MED ORDER — PROPOFOL 1000 MG/100ML IV EMUL
INTRAVENOUS | Status: AC
  Administered 2016-11-04: 80 ug/kg/min
  Filled 2016-11-03: qty 100

## 2016-11-03 NOTE — ED Provider Notes (Signed)
[facility] DEPT Provider Note   CSN: [id number] Arrival date & time: 11/03/16  2324  By signing my name below, I, Marcello Moores, attest that this documentation has been prepared under the direction and in the presence of Jola Schmidt, MD. Electronically Signed: Marcello Moores, ED Scribe. 11/03/16. 11:58 PM.  History   Chief Complaint Chief Complaint  Patient presents with  . Allergic Reaction   The history is provided by the patient. No language interpreter was used.    HPI Comments: Joshua Lara is a 71 y.o. male, BIB EMS, with a PMHx of allergic reactions, who presents to the Emergency Department complaining of a persistent swollen tongue and resolved itching with associated SOB that may be due to an allergic reaction that occurred PTA today.The pt states that he was taking antibiotics, stopped a few days ago, and then resumed taking them today. Otherwise he attests no new medications. Per EMS, the pt has had Epi (4 of 4.5), albuterol, solumedrol, and benadryl and push epi (3). He states that he has a "Level 4, intubation problem" from recent ear surgery. The pt also has a h/x of CAD, CHF, HTN, appendectomy, adenoidectomy, tonsillectomy, and cholecystectomy. He denies currently itching.     Past Medical History:  Diagnosis Date  . Arthritis    "hands, back, ankles" (05/11/2014)  . CHF (congestive heart failure) ([place])   . Chronic lower back pain   . Compression fracture of lumbar vertebra ([place])   . Coronary artery disease   . Daily headache    "recently" (05/11/2014)  . Depression    "I might be slight" (05/11/2014)  . GERD (gastroesophageal reflux disease)   . High cholesterol   . Hypertension   . Hypothyroid   . OSA treated with BiPAP   . Pneumonia    "couple times; last time was 03/2011" (05/11/2014)  . Type II diabetes mellitus [facility])     Patient Active Problem List   Diagnosis Date Noted  . Acute on chronic diastolic heart failure ([place]) 05/15/2014  .  Dyspnea 05/10/2014  . Acute respiratory failure with hypoxemia ([place]) 05/10/2014  . OSA on CPAP 05/10/2014  . Abnormal CT scan, chest 05/10/2014  . DOE (dyspnea on exertion) 04/05/2011  . PNA (pneumonia) 04/05/2011  . DM (diabetes mellitus) ([place]) 04/05/2011  . HTN (hypertension) 04/05/2011  . Tachycardia 04/05/2011  . Chest pain 04/05/2011  . Fever 04/05/2011  . Leukocytosis 04/05/2011  . Hypothyroidism 04/05/2011  . GERD (gastroesophageal reflux disease) 04/05/2011  . RA (rheumatoid arthritis) ([place]) 04/05/2011  . Obesity 04/05/2011  . CAD in native artery 04/05/2011  . Hypokalemia 04/05/2011    Past Surgical History:  Procedure Laterality Date  . APPENDECTOMY    . CARDIAC CATHETERIZATION  2001; 2009  . CATARACT EXTRACTION W/ INTRAOCULAR LENS IMPLANT Left   . CHOLECYSTECTOMY  1980's   Archie Endo 07/13/2010   . EYE MUSCLE SURGERY Left   . LEFT AND RIGHT HEART CATHETERIZATION WITH CORONARY ANGIOGRAM N/A 05/30/2014   Procedure: LEFT AND RIGHT HEART CATHETERIZATION WITH CORONARY ANGIOGRAM;  Surgeon: Jolaine Artist, MD;  Location: [facility] CATH LAB;  Service: Cardiovascular;  Laterality: N/A;  . MASTOIDECTOMY Right 1970's   Archie Endo 07/13/2010  . SHOULDER OPEN ROTATOR CUFF REPAIR Left 07/2010  . SPHINCTEROTOMY    . TONSILLECTOMY AND ADENOIDECTOMY         Home Medications    Prior to Admission medications   Medication Sig Start Date End Date Taking? Authorizing Provider  aspirin EC 81 MG tablet Take  81 mg by mouth daily.      [provider]  baclofen (LIORESAL) 10 MG tablet Take 0.5 mg ( half tablet) twice a day and  A whole tablet at bedtime 01/07/16   Danella Sensing L, NP  bimatoprost (LUMIGAN) 0.01 % SOLN Place 1 drop into both eyes at bedtime.  10/17/15   [provider]  Cholecalciferol (VITAMIN D3) 2000 units capsule Take 2,000 Units by mouth daily.    [provider]  clobetasol cream (TEMOVATE) 8.10 % Apply 1 application topically daily.  03/24/14    [provider]  Coenzyme Q10-Vitamin E (QUNOL ULTRA COQ10 PO) Take 100 mg by mouth daily.    [provider]  dorzolamide-timolol (COSOPT) 22.3-6.8 MG/ML ophthalmic solution Place 1 drop into both eyes 2 (two) times daily.    [provider]  escitalopram (LEXAPRO) 10 MG tablet Take 10 mg by mouth daily.    [provider]  furosemide (LASIX) 40 MG tablet Take 1 tablet (40 mg total) by mouth daily. 05/15/14   Kelvin Cellar, MD  furosemide (LASIX) 80 MG tablet Take 80 mg by mouth daily.    [provider]  gemfibrozil (LOPID) 600 MG tablet Take 600 mg by mouth 2 (two) times daily. 05/16/14   [provider]  glipiZIDE (GLUCOTROL XL) 10 MG 24 hr tablet Take 10 mg by mouth 2 (two) times daily.    [provider]  INCRUSE ELLIPTA 62.5 MCG/INH AEPB INHALE 1 PUFF INTO THE LUNGS DAILY. 09/17/16   [provider]  levothyroxine (SYNTHROID, LEVOTHROID) 100 MCG tablet Take 100 mcg by mouth daily.      [provider]  lidocaine (LIDODERM) 5 % Place 1 patch onto the skin daily. Remove & Discard patch within 12 hours or as directed by MD Patient taking differently: Place 1 patch onto the skin daily as needed (for pain). Remove & Discard patch within 12 hours or as directed by MD 07/12/15   Jamse Arn, MD  losartan (COZAAR) 25 MG tablet Take 25 mg by mouth daily.    [provider]  mometasone (NASONEX) 50 MCG/ACT nasal spray Place 2 sprays into the nose daily.      [provider]  nitroGLYCERIN (NITROSTAT) 0.4 MG SL tablet Place 0.4 mg under the tongue every 5 (five) minutes as needed for chest pain.    [provider]  ONE TOUCH ULTRA TEST test strip 2 (two) times daily. use for testing 04/23/14   [provider]  potassium chloride (MICRO-K) 10 MEQ CR capsule Take 10 mEq by mouth 2 (two) times daily.     [provider]  ranitidine (ZANTAC) 150 MG capsule Take 150 mg by mouth daily  as needed for heartburn.     [provider]  sitaGLIPtan-metformin (JANUMET) 50-1000 MG per tablet Take 1 tablet by mouth 2 (two) times daily with a meal.      [provider]  traMADol (ULTRAM) 50 MG tablet Take 1 tablet (50 mg total) by mouth every 6 (six) hours as needed. Patient taking differently: Take 50 mg by mouth every 6 (six) hours as needed (for pain).  09/06/15   Jamse Arn, MD    Family History Family History  Problem Relation Age of Onset  . Heart attack Father     Social History Social History  Substance Use Topics  . Smoking status: Former Smoker    Packs/day: 2.00    Years: 30.00    Types:  Cigarettes    Quit date: 05/12/1990  . Smokeless tobacco: Never Used  . Alcohol use 0.0 oz/week     Comment: 05/11/2014 "an occasional beer when out w/people; not often"     Allergies   Duloxetine hcl; Etanercept; and Tape   Review of Systems Review of Systems  10 Systems reviewed and are negative for acute change except as noted in the HPI.  Physical Exam Updated Vital Signs BP (!) 149/70 (BP Location: Left Arm)   Pulse (!) 104   Temp 99.1 F (37.3 C) (Axillary)   Resp 18   Ht 5\' 9"  (1.753 m)   Wt 239 lb (108.4 kg)   SpO2 99%   BMI 35.29 kg/m   Physical Exam  Constitutional: He is oriented to person, place, and time. He appears well-developed and well-nourished.  HENT:  Significant tongue swelling that's beginning to protrude from his mouth.  Eyes: EOM are normal.  Neck: Normal range of motion.  Cardiovascular: Normal rate, regular rhythm, normal heart sounds and intact distal pulses.   Pulmonary/Chest: Effort normal and breath sounds normal. No respiratory distress.  Abdominal: Soft. He exhibits no distension. There is no tenderness.  Musculoskeletal: Normal range of motion.  Neurological: He is alert and oriented to person, place, and time.  Skin: Skin is warm and dry.  Psychiatric: He has a normal mood and affect. Judgment  normal.  Nursing note and vitals reviewed.    ED Treatments / Results   DIAGNOSTIC STUDIES: Oxygen Saturation is 98% on RA, normal by my interpretation.   COORDINATION OF CARE: 11:28 PM-Discussed next steps with pt. Pt verbalized understanding and is agreeable with the plan.   Labs (all labs ordered are listed, but only abnormal results are displayed) Labs Reviewed  I-STAT ARTERIAL BLOOD GAS, ED - Abnormal; Notable for the following:       Result Value   pH, Arterial 7.264 (*)    pCO2 arterial 48.6 (*)    Acid-base deficit 5.0 (*)    All other components within normal limits  BASIC METABOLIC PANEL  CBC WITH DIFFERENTIAL/PLATELET  MAGNESIUM  PHOSPHORUS    EKG ECG interpretation  Date: 11/04/2016  Rate: 105  Rhythm: normal sinus rhythm  QRS Axis: normal  Intervals: normal  ST/T Wave abnormalities: normal  Conduction Disutrbances: none  Narrative Interpretation:   Old EKG Reviewed: No significant changes noted      Radiology Dg Chest Portable 1 View  Result Date: 11/04/2016 CLINICAL DATA:  Tongue swelling.  Respiratory distress.  Intubation. EXAM: PORTABLE CHEST 1 VIEW COMPARISON:  12/25/2015 FINDINGS: Endotracheal tube is 3.6 cm above the carina. Mild streaky opacities in the bases may represent scarring or atelectasis. No confluent consolidation. No large effusion. No pneumothorax. IMPRESSION: Satisfactory ET tube position.  No consolidation or large effusion. Electronically Signed   By: Andreas Newport M.D.   On: 11/04/2016 00:38   +++++++++++++++++++++++++++++++++++++++++++++++   Procedures Procedure Name: Intubation Date/Time: 11/04/2016 2:48 AM Performed by: Jola Schmidt      Required items: required blood products, implants, devices, and special equipment available Patient identity confirmed: provided demographic data and hospital-assigned identification number Time out: Immediately prior to procedure a "time out" was called to verify the correct  patient, procedure, equipment, support staff and site/side marked as required. Indications: resp failure, airway protection Intubation method: Glidescope Laryngoscopy  Preoxygenation: BVM Sedatives: Etomidate Paralytic: Succinylcholine Tube Size: 7.5 cuffed Post-procedure assessment: chest rise and ETCO2 monitor Breath sounds: equal and absent over the epigastrium Tube secured with:  ETT holder Chest x-ray interpreted by radiologist and me. Chest x-ray findings: endotracheal tube in appropriate position Patient tolerated the procedure well with no immediate complications.   CRITICAL CARE Performed by: Hoy Morn Total critical care time: 31 minutes Critical care time was exclusive of separately billable procedures and treating other patients. Critical care was necessary to treat or prevent imminent or life-threatening deterioration. Critical care was time spent personally by me on the following activities: development of treatment plan with patient and/or surrogate as well as nursing, discussions with consultants, evaluation of patient's response to treatment, examination of patient, obtaining history from patient or surrogate, ordering and performing treatments and interventions, ordering and review of laboratory studies, ordering and review of radiographic studies, pulse oximetry and re-evaluation of patient's condition.  ++++++++++++++++++++++++++++++++++++++++++++++++++++++   Medications Ordered in ED Medications  propofol (DIPRIVAN) 1000 MG/100ML infusion (not administered)  propofol (DIPRIVAN) 1000 MG/100ML infusion (not administered)     Initial Impression / Assessment and Plan / ED Course  I have reviewed the triage vital signs and the nursing notes.  Pertinent labs & imaging results that were available during my care of the patient were reviewed by me and considered in my medical decision making (see chart for details).     Patient with significant respiratory  compromise and oral airway compromise with the degree of swelling of his tongue.  Intubated for airway protection.  Patient and family updated.  Received epi, steroids, H2 blockade.  He was fine earlier today.  Doubt Ludwig's  Final Clinical Impressions(s) / ED Diagnoses   Final diagnoses:  Acute respiratory failure, unspecified whether with hypoxia or hypercapnia (HCC)  Severe tongue swelling    New Prescriptions Current Discharge Medication List     I personally performed the services described in this documentation, which was scribed in my presence. The recorded information has been reviewed and is accurate.        Jola Schmidt, MD 11/04/16 [phone]

## 2016-11-04 ENCOUNTER — Emergency Department (HOSPITAL_COMMUNITY): Payer: Medicare Other

## 2016-11-04 DIAGNOSIS — I509 Heart failure, unspecified: Secondary | ICD-10-CM | POA: Diagnosis present

## 2016-11-04 DIAGNOSIS — Z888 Allergy status to other drugs, medicaments and biological substances status: Secondary | ICD-10-CM | POA: Diagnosis not present

## 2016-11-04 DIAGNOSIS — T370X5A Adverse effect of sulfonamides, initial encounter: Secondary | ICD-10-CM | POA: Diagnosis present

## 2016-11-04 DIAGNOSIS — M545 Low back pain: Secondary | ICD-10-CM | POA: Diagnosis present

## 2016-11-04 DIAGNOSIS — K219 Gastro-esophageal reflux disease without esophagitis: Secondary | ICD-10-CM | POA: Diagnosis present

## 2016-11-04 DIAGNOSIS — Z961 Presence of intraocular lens: Secondary | ICD-10-CM | POA: Diagnosis present

## 2016-11-04 DIAGNOSIS — J9600 Acute respiratory failure, unspecified whether with hypoxia or hypercapnia: Secondary | ICD-10-CM | POA: Diagnosis not present

## 2016-11-04 DIAGNOSIS — Z6836 Body mass index (BMI) 36.0-36.9, adult: Secondary | ICD-10-CM | POA: Diagnosis not present

## 2016-11-04 DIAGNOSIS — G4733 Obstructive sleep apnea (adult) (pediatric): Secondary | ICD-10-CM | POA: Diagnosis present

## 2016-11-04 DIAGNOSIS — T783XXA Angioneurotic edema, initial encounter: Secondary | ICD-10-CM | POA: Diagnosis present

## 2016-11-04 DIAGNOSIS — Z79899 Other long term (current) drug therapy: Secondary | ICD-10-CM | POA: Diagnosis not present

## 2016-11-04 DIAGNOSIS — E119 Type 2 diabetes mellitus without complications: Secondary | ICD-10-CM | POA: Diagnosis present

## 2016-11-04 DIAGNOSIS — Y92009 Unspecified place in unspecified non-institutional (private) residence as the place of occurrence of the external cause: Secondary | ICD-10-CM | POA: Diagnosis not present

## 2016-11-04 DIAGNOSIS — R0603 Acute respiratory distress: Secondary | ICD-10-CM | POA: Diagnosis not present

## 2016-11-04 DIAGNOSIS — E039 Hypothyroidism, unspecified: Secondary | ICD-10-CM | POA: Diagnosis present

## 2016-11-04 DIAGNOSIS — E669 Obesity, unspecified: Secondary | ICD-10-CM | POA: Diagnosis present

## 2016-11-04 DIAGNOSIS — H409 Unspecified glaucoma: Secondary | ICD-10-CM | POA: Diagnosis present

## 2016-11-04 DIAGNOSIS — I2510 Atherosclerotic heart disease of native coronary artery without angina pectoris: Secondary | ICD-10-CM | POA: Diagnosis present

## 2016-11-04 DIAGNOSIS — M1990 Unspecified osteoarthritis, unspecified site: Secondary | ICD-10-CM | POA: Diagnosis present

## 2016-11-04 DIAGNOSIS — I110 Hypertensive heart disease with heart failure: Secondary | ICD-10-CM | POA: Diagnosis present

## 2016-11-04 DIAGNOSIS — R220 Localized swelling, mass and lump, head: Secondary | ICD-10-CM

## 2016-11-04 DIAGNOSIS — E7800 Pure hypercholesterolemia, unspecified: Secondary | ICD-10-CM | POA: Diagnosis present

## 2016-11-04 DIAGNOSIS — Z7982 Long term (current) use of aspirin: Secondary | ICD-10-CM | POA: Diagnosis not present

## 2016-11-04 DIAGNOSIS — Z87891 Personal history of nicotine dependence: Secondary | ICD-10-CM | POA: Diagnosis not present

## 2016-11-04 DIAGNOSIS — J9811 Atelectasis: Secondary | ICD-10-CM | POA: Diagnosis not present

## 2016-11-04 DIAGNOSIS — F329 Major depressive disorder, single episode, unspecified: Secondary | ICD-10-CM | POA: Diagnosis present

## 2016-11-04 DIAGNOSIS — Z4682 Encounter for fitting and adjustment of non-vascular catheter: Secondary | ICD-10-CM | POA: Diagnosis not present

## 2016-11-04 DIAGNOSIS — Z9842 Cataract extraction status, left eye: Secondary | ICD-10-CM | POA: Diagnosis not present

## 2016-11-04 DIAGNOSIS — G8929 Other chronic pain: Secondary | ICD-10-CM | POA: Diagnosis present

## 2016-11-04 LAB — BASIC METABOLIC PANEL
ANION GAP: 12 (ref 5–15)
ANION GAP: 8 (ref 5–15)
BUN: 14 mg/dL (ref 6–20)
BUN: 12 mg/dL (ref 6–20)
CALCIUM: 9.6 mg/dL (ref 8.9–10.3)
CO2: 20 mmol/L — AB (ref 22–32)
CO2: 21 mmol/L — ABNORMAL LOW (ref 22–32)
Calcium: 9 mg/dL (ref 8.9–10.3)
Chloride: 106 mmol/L (ref 101–111)
Chloride: 111 mmol/L (ref 101–111)
Creatinine, Ser: 0.91 mg/dL (ref 0.61–1.24)
Creatinine, Ser: 0.88 mg/dL (ref 0.61–1.24)
GFR calc Af Amer: >60 mL/min (ref >60)
GLUCOSE: 266 mg/dL — AB (ref 65–99)
GLUCOSE: 248 mg/dL — AB (ref 65–99)
POTASSIUM: 3.4 mmol/L — AB (ref 3.5–5.1)
POTASSIUM: 4.3 mmol/L (ref 3.5–5.1)
SODIUM: 140 mmol/L (ref 135–145)
Sodium: 138 mmol/L (ref 135–145)

## 2016-11-04 LAB — I-STAT ARTERIAL BLOOD GAS, ED
Acid-base deficit: 5 mmol/L — ABNORMAL HIGH (ref 0.0–2.0)
Bicarbonate: 22 mmol/L (ref 20.0–28.0)
O2 Saturation: 96 %
PCO2 ART: 48.6 mmHg — AB (ref 32.0–48.0)
TCO2: 23 mmol/L (ref 0–100)
pH, Arterial: 7.264 — ABNORMAL LOW (ref 7.350–7.450)
pO2, Arterial: 93 mmHg (ref 83.0–108.0)

## 2016-11-04 LAB — GLUCOSE, CAPILLARY
GLUCOSE-CAPILLARY: 245 mg/dL — AB (ref 65–99)
GLUCOSE-CAPILLARY: 277 mg/dL — AB (ref 65–99)
Glucose-Capillary: 270 mg/dL — ABNORMAL HIGH (ref 65–99)
Glucose-Capillary: 232 mg/dL — ABNORMAL HIGH (ref 65–99)
Glucose-Capillary: 218 mg/dL — ABNORMAL HIGH (ref 65–99)

## 2016-11-04 LAB — HEPATIC FUNCTION PANEL
ALT: 24 U/L (ref 17–63)
AST: 33 U/L (ref 15–41)
Albumin: 3.6 g/dL (ref 3.5–5.0)
Alkaline Phosphatase: 92 U/L (ref 38–126)
BILIRUBIN DIRECT: 0.2 mg/dL (ref 0.1–0.5)
BILIRUBIN INDIRECT: 0.6 mg/dL (ref 0.3–0.9)
BILIRUBIN TOTAL: 0.8 mg/dL (ref 0.3–1.2)
Total Protein: 6.6 g/dL (ref 6.5–8.1)

## 2016-11-04 LAB — CBC WITH DIFFERENTIAL/PLATELET
BASOS ABS: 0 10*3/uL (ref 0.0–0.1)
Basophils Relative: 0 %
EOS ABS: 0.1 10*3/uL (ref 0.0–0.7)
Eosinophils Relative: 1 %
HEMATOCRIT: 36.3 % — AB (ref 39.0–52.0)
HEMOGLOBIN: 12.5 g/dL — AB (ref 13.0–17.0)
LYMPHS PCT: 6 %
Lymphs Abs: 0.6 10*3/uL — ABNORMAL LOW (ref 0.7–4.0)
MCH: 29 pg (ref 26.0–34.0)
MCHC: 34.4 g/dL (ref 30.0–36.0)
MCV: 84.2 fL (ref 78.0–100.0)
MONOS PCT: 6 %
Monocytes Absolute: 0.6 10*3/uL (ref 0.1–1.0)
NEUTROS ABS: 9.2 10*3/uL — AB (ref 1.7–7.7)
NEUTROS PCT: 87 %
Platelets: 211 10*3/uL (ref 150–400)
RBC: 4.31 MIL/uL (ref 4.22–5.81)
RDW: 13.5 % (ref 11.5–15.5)
WBC: 10.5 10*3/uL (ref 4.0–10.5)

## 2016-11-04 LAB — PHOSPHORUS
PHOSPHORUS: 3 mg/dL (ref 2.5–4.6)
PHOSPHORUS: 1.9 mg/dL — AB (ref 2.5–4.6)
PHOSPHORUS: 1.7 mg/dL — AB (ref 2.5–4.6)

## 2016-11-04 LAB — MAGNESIUM
MAGNESIUM: 1.7 mg/dL (ref 1.7–2.4)
MAGNESIUM: 1.9 mg/dL (ref 1.7–2.4)
Magnesium: 2 mg/dL (ref 1.7–2.4)

## 2016-11-04 LAB — TROPONIN I

## 2016-11-04 LAB — MRSA PCR SCREENING: MRSA by PCR: NEGATIVE

## 2016-11-04 LAB — LACTIC ACID, PLASMA
LACTIC ACID, VENOUS: 4.5 mmol/L — AB (ref 0.5–1.9)
Lactic Acid, Venous: 3.3 mmol/L (ref 0.5–1.9)

## 2016-11-04 MED ORDER — PROPOFOL 1000 MG/100ML IV EMUL
INTRAVENOUS | Status: AC
  Administered 2016-11-04: 02:00:00
  Filled 2016-11-04: qty 100

## 2016-11-04 MED ORDER — SODIUM CHLORIDE 0.9 % IV SOLN
INTRAVENOUS | Status: DC
  Administered 2016-11-04: 04:00:00 via INTRAVENOUS

## 2016-11-04 MED ORDER — SODIUM CHLORIDE 0.9 % IV BOLUS (SEPSIS)
1000.0000 mL | Freq: Once | INTRAVENOUS | Status: AC
  Administered 2016-11-04: 1000 mL via INTRAVENOUS

## 2016-11-04 MED ORDER — SUCCINYLCHOLINE CHLORIDE 20 MG/ML IJ SOLN
INTRAMUSCULAR | Status: DC | PRN
  Administered 2016-11-03: 100 mg via INTRAVENOUS

## 2016-11-04 MED ORDER — CHLORHEXIDINE GLUCONATE 0.12% ORAL RINSE (MEDLINE KIT)
15.0000 mL | Freq: Two times a day (BID) | OROMUCOSAL | Status: DC
  Administered 2016-11-04 – 2016-11-05 (×3): 15 mL via OROMUCOSAL

## 2016-11-04 MED ORDER — INSULIN ASPART 100 UNIT/ML ~~LOC~~ SOLN
0.0000 [IU] | SUBCUTANEOUS | Status: DC
  Administered 2016-11-04: 5 [IU] via SUBCUTANEOUS
  Administered 2016-11-04 (×2): 8 [IU] via SUBCUTANEOUS
  Administered 2016-11-04: 5 [IU] via SUBCUTANEOUS
  Administered 2016-11-05 (×2): 3 [IU] via SUBCUTANEOUS
  Administered 2016-11-05: 8 [IU] via SUBCUTANEOUS
  Administered 2016-11-05: 5 [IU] via SUBCUTANEOUS
  Administered 2016-11-05: 8 [IU] via SUBCUTANEOUS

## 2016-11-04 MED ORDER — HEPARIN SODIUM (PORCINE) 5000 UNIT/ML IJ SOLN
5000.0000 [IU] | Freq: Three times a day (TID) | INTRAMUSCULAR | Status: DC
  Administered 2016-11-04 – 2016-11-06 (×6): 5000 [IU] via SUBCUTANEOUS
  Filled 2016-11-04 (×6): qty 1

## 2016-11-04 MED ORDER — PROPOFOL 1000 MG/100ML IV EMUL
5.0000 ug/kg/min | Freq: Once | INTRAVENOUS | Status: AC
  Administered 2016-11-03 – 2016-11-04 (×2): 60 ug/kg/min via INTRAVENOUS
  Administered 2016-11-04: 60 mg/h via INTRAVENOUS
  Administered 2016-11-04: 60 ug/kg/min via INTRAVENOUS

## 2016-11-04 MED ORDER — FENTANYL CITRATE (PF) 100 MCG/2ML IJ SOLN
100.0000 ug | Freq: Once | INTRAMUSCULAR | Status: AC
  Administered 2016-11-04: 100 ug via INTRAVENOUS
  Filled 2016-11-04: qty 2

## 2016-11-04 MED ORDER — ORAL CARE MOUTH RINSE
15.0000 mL | Freq: Four times a day (QID) | OROMUCOSAL | Status: DC
  Administered 2016-11-04 – 2016-11-05 (×6): 15 mL via OROMUCOSAL

## 2016-11-04 MED ORDER — ETOMIDATE 2 MG/ML IV SOLN
INTRAVENOUS | Status: DC | PRN
  Administered 2016-11-03: 25 mg via INTRAVENOUS

## 2016-11-04 MED ORDER — INSULIN GLARGINE 100 UNIT/ML ~~LOC~~ SOLN
15.0000 [IU] | Freq: Every day | SUBCUTANEOUS | Status: DC
  Administered 2016-11-04: 15 [IU] via SUBCUTANEOUS
  Filled 2016-11-04: qty 0.15

## 2016-11-04 MED ORDER — FENTANYL CITRATE (PF) 100 MCG/2ML IJ SOLN
50.0000 ug | INTRAMUSCULAR | Status: DC | PRN
  Administered 2016-11-04: 50 ug via INTRAVENOUS
  Filled 2016-11-04: qty 2

## 2016-11-04 MED ORDER — PROPOFOL 1000 MG/100ML IV EMUL
INTRAVENOUS | Status: DC | PRN
  Administered 2016-11-03: 40 ug/kg/min via INTRAVENOUS

## 2016-11-04 MED ORDER — DIPHENHYDRAMINE HCL 50 MG/ML IJ SOLN
25.0000 mg | Freq: Four times a day (QID) | INTRAMUSCULAR | Status: DC
  Administered 2016-11-04 – 2016-11-05 (×5): 25 mg via INTRAVENOUS
  Filled 2016-11-04: qty 1
  Filled 2016-11-04 (×3): qty 0.5
  Filled 2016-11-04: qty 1
  Filled 2016-11-04 (×2): qty 0.5
  Filled 2016-11-04: qty 1

## 2016-11-04 MED ORDER — METHYLPREDNISOLONE SODIUM SUCC 40 MG IJ SOLR
40.0000 mg | Freq: Four times a day (QID) | INTRAMUSCULAR | Status: DC
  Administered 2016-11-04 – 2016-11-05 (×5): 40 mg via INTRAVENOUS
  Filled 2016-11-04 (×6): qty 1

## 2016-11-04 MED ORDER — PRO-STAT SUGAR FREE PO LIQD
30.0000 mL | Freq: Two times a day (BID) | ORAL | Status: DC
  Administered 2016-11-04 (×2): 30 mL
  Filled 2016-11-04 (×3): qty 30

## 2016-11-04 MED ORDER — INSULIN ASPART 100 UNIT/ML ~~LOC~~ SOLN
3.0000 [IU] | SUBCUTANEOUS | Status: DC
  Administered 2016-11-04 – 2016-11-05 (×7): 3 [IU] via SUBCUTANEOUS

## 2016-11-04 MED ORDER — FAMOTIDINE IN NACL 20-0.9 MG/50ML-% IV SOLN
20.0000 mg | Freq: Once | INTRAVENOUS | Status: AC
  Administered 2016-11-04: 20 mg via INTRAVENOUS
  Filled 2016-11-04: qty 50

## 2016-11-04 MED ORDER — LEVOTHYROXINE SODIUM 100 MCG IV SOLR
50.0000 ug | Freq: Every day | INTRAVENOUS | Status: DC
  Administered 2016-11-04: 2.5 ug via INTRAVENOUS
  Administered 2016-11-05: 50 ug via INTRAVENOUS
  Filled 2016-11-04 (×4): qty 5

## 2016-11-04 MED ORDER — INSULIN ASPART 100 UNIT/ML ~~LOC~~ SOLN
2.0000 [IU] | SUBCUTANEOUS | Status: DC
Start: 2016-11-04 — End: 2016-11-04
  Administered 2016-11-04: 6 [IU] via SUBCUTANEOUS

## 2016-11-04 MED ORDER — FAMOTIDINE IN NACL 20-0.9 MG/50ML-% IV SOLN
20.0000 mg | Freq: Two times a day (BID) | INTRAVENOUS | Status: DC
  Administered 2016-11-04 – 2016-11-05 (×4): 20 mg via INTRAVENOUS
  Filled 2016-11-04 (×5): qty 50

## 2016-11-04 MED ORDER — VITAL HIGH PROTEIN PO LIQD
1000.0000 mL | ORAL | Status: DC
  Administered 2016-11-04: 1000 mL
  Administered 2016-11-05: 08:00:00

## 2016-11-04 MED ORDER — FENTANYL CITRATE (PF) 100 MCG/2ML IJ SOLN
50.0000 ug | INTRAMUSCULAR | Status: DC | PRN
  Administered 2016-11-05: 50 ug via INTRAVENOUS
  Filled 2016-11-04: qty 2

## 2016-11-04 MED ORDER — PROPOFOL 1000 MG/100ML IV EMUL
5.0000 ug/kg/min | INTRAVENOUS | Status: DC
  Administered 2016-11-04: 5 ug/kg/min via INTRAVENOUS

## 2016-11-04 MED ORDER — IPRATROPIUM-ALBUTEROL 0.5-2.5 (3) MG/3ML IN SOLN: 3.0000 mL | RESPIRATORY_TRACT | Status: DC | PRN

## 2016-11-04 MED ORDER — PROPOFOL 1000 MG/100ML IV EMUL
5.0000 ug/kg/min | INTRAVENOUS | Status: DC
  Administered 2016-11-04 (×2): 15 ug/kg/min via INTRAVENOUS
  Filled 2016-11-04 (×2): qty 100

## 2016-11-04 NOTE — H&P (Signed)
PULMONARY / CRITICAL CARE MEDICINE   Name: Joshua Lara MRN: [id number] DOB: 01-Jun-1945    ADMISSION DATE:  11/03/2016 CONSULTATION DATE:  11/04/2016  REFERRING MD:  Dr. Venora Maples   CHIEF COMPLAINT:  Angioedema   HISTORY OF PRESENT ILLNESS:  Information obtained from medical record as patient is intubated and sedated  71 year old male with PMH of CHF, CAD, Depression, GERD, HTN, Hypothyroidism, OSA (BiPAP at HS), DM, Chronic Lower Back Pain   Presents to ED on 6/26 with acute onset of tongue swelling, itching, and SOB.  Daughter and son at bedside report patient had just left around 10 pm from visiting his wife who was admitted this morning, stopped by [place], went home to walk the dogs and took his regular nightly medicines.  His son was with him when he started complaining of tongue swelling and itching.  Family denies patient having prior allergic reactions or angioedema, no family history of angioedema, prior food allergies, or knowledge of patient taking new medications.  Of note, patient was prescribed bactrim on 6/1 but they do not know why he was taking it.  A bottle is present that is half taken.  Patient reportedly told the EDP that he recently was placed on antibiotics, stopped taking them a few days ago, and then restarted 6/25.  EMS was called who administered EPI, Albuterol, Solumedrol and Benadryl. Upon arrival to ED patient had significant tongue swelling in which continued to worsen, was intubated for airway protection. PCCM was consulted.    SUBJECTIVE: tongue remains swollen, lactic down but abnormal, PH last 7.26  VITAL SIGNS: BP 125/78   Pulse 81   Temp 98.8 F (37.1 C) (Oral)   Resp 15   Ht 5\' 10"  (1.778 m)   Wt 116.2 kg (256 lb 2.8 oz)   SpO2 99%   BMI 36.76 kg/m   HEMODYNAMICS:    VENTILATOR SETTINGS: Vent Mode: PRVC FiO2 (%):  [40 %] 40 % Set Rate:  [16 bmp-20 bmp] 20 bmp Vt Set:  [600 mL] 600 mL PEEP:  [5 cmH20] 5 cmH20 Plateau Pressure:  [7 cmH20-15  cmH20] 15 cmH20  INTAKE / OUTPUT: I/O last 3 completed shifts: In: 177.2 [I.V.:157.2; NG/GT:20] Out: -   PHYSICAL EXAMINATION: General: awake, FC Neuro: nonfocla, rass -1, fc HEENT: slight swelling neck PULM: wheezing mild to none, CTA anterior CV: s1 s2 RRR no r distant GI: soft, BS wnl, no r Extremities: no edmea    LABS:  BMET  Recent Labs Lab 11/04/16 0244  NA 138  K 3.4*  CL 106  CO2 20*  BUN 14  CREATININE 0.91  GLUCOSE 266*    Electrolytes  Recent Labs Lab 11/04/16 0244  CALCIUM 9.0  MG 1.7  PHOS 3.0    CBC  Recent Labs Lab 11/04/16 0244  WBC 10.5  HGB 12.5*  HCT 36.3*  PLT 211    Coag's No results for input(s): APTT, INR in the last 168 hours.  Sepsis Markers  Recent Labs Lab 11/04/16 0334 11/04/16 0720  LATICACIDVEN 4.5* 3.3*    ABG  Recent Labs Lab 11/04/16 0041  PHART 7.264*  PCO2ART 48.6*  PO2ART 93.0    Liver Enzymes  Recent Labs Lab 11/04/16 0334  AST 33  ALT 24  ALKPHOS 92  BILITOT 0.8  ALBUMIN 3.6    Cardiac Enzymes  Recent Labs Lab 11/04/16 0334  TROPONINI <0.03    Glucose  Recent Labs Lab 11/04/16 0248 11/04/16 0804  GLUCAP 245* 270*  Imaging Dg Chest Portable 1 View  Result Date: 11/04/2016 CLINICAL DATA:  Tongue swelling.  Respiratory distress.  Intubation. EXAM: PORTABLE CHEST 1 VIEW COMPARISON:  12/25/2015 FINDINGS: Endotracheal tube is 3.6 cm above the carina. Mild streaky opacities in the bases may represent scarring or atelectasis. No confluent consolidation. No large effusion. No pneumothorax. IMPRESSION: Satisfactory ET tube position.  No consolidation or large effusion. Electronically Signed   By: Andreas Newport M.D.   On: 11/04/2016 00:38     STUDIES:  CXR 6/26 >> satisfactory ETT placement, no acute process  CULTURES: None.   ANTIBIOTICS: None.   SIGNIFICANT EVENTS: 6/26 > Presents to ED with angioedema   LINES/TUBES: ETT 6/26 >>   DISCUSSION: 71 year old  male presents to ED with angioedema requiring intubation for airway protection   ASSESSMENT / PLAN:  PULMONARY A: Intubated for airway protection in the setting of severe angioedema vs ? Allergic rxn - BActrim? Food< Losartan? Hx OSA - uses BiPAP  - patient has no prior hx, family hx of angioedema or allergic reaction, recently taking bactrim within last month, otherwise no new medications known P:   Weaning cpap 5 ps5, goal 2 hours, NO extubation planned with unknown cause, phsycial exam and lactic and ph last Re assess inafternoon Solumedrol 40mg  q 6 Pepcid 20mg  BID Benadryl 25mg  q 6hr Duonebs q 4 prn  Get leak testing with weaning List bactrim as allergy  CARDIOVASCULAR A:  Allergic rxn vs angioedema s/p EPI  Sedation related hypotension Hx HTN, CHF (EF 60-65% on 1/16) , HLD P:  ICU monitoring Maintain MAP >65 Holding home losartan -? Cause for angioedema  - - he confirms did not take arb   RENAL A:   Hypokalemia P:   BMP in pm  kvo  GASTROINTESTINAL A:   Hx GERD P:   NPO Pepcid for SUP start feeds   HEMATOLOGIC A:   DVt prevention P:  CBC am Heparin SQ, SCDs  INFECTIOUS A:   No acute processes - recently rx bactrim 6/1 P:   Monitor fever curve LIST bactrim as alllergy  ENDOCRINE A:   DM Hypothyroid hyperglcyemia P:   SSI CBG q 4  Continue synthroid  Add feeds, start lantus and T fcoverage   NEUROLOGIC A:   Hx back pain, arthritis, depression, glaucoma P:   RASS goal:0 Fentanyl prn  Propofol gtt Daily WUA  FAMILY  - Updates: to pt in room  - Inter-disciplinary family meet or Palliative Care meeting due by:  11/10/16  CCT 45 mins   Lavon Paganini. Titus Mould, MD, [place] Pgr: [facility]

## 2016-11-04 NOTE — Progress Notes (Signed)
CRITICAL VALUE ALERT  Critical Value:  Lactic Acid 4.5  Date & Time Notied:  11/04/2016 04:22  Provider Notified: Fatima Blank.  Orders Received/Actions taken: Continue to trend

## 2016-11-04 NOTE — ED Notes (Signed)
Daughter at bedside. Hearing aids given to daughter to take home.

## 2016-11-04 NOTE — ED Notes (Signed)
Portable xray at bedside.

## 2016-11-04 NOTE — Progress Notes (Signed)
Patient trying to talk to family writing notes communicating well while on ventilator wean. Patient becomes SOB increased work of breathing coughing. Patient placed back on support to rest. Sedation restarted see MAR.

## 2016-11-04 NOTE — Progress Notes (Signed)
Initial Nutrition Assessment  DOCUMENTATION CODES:   Obesity unspecified  INTERVENTION:    Vital High Protein at 40 ml/h (960 ml per day)  Pro-stat 30 ml QID  Provides 1360 kcal (1635 kcal total with Propofol), 144 gm protein, 803 ml free water daily  NUTRITION DIAGNOSIS:   Inadequate oral intake related to inability to eat as evidenced by NPO status.  GOAL:   Provide needs based on ASPEN/SCCM guidelines  MONITOR:   Vent status, Labs, Weight trends, TF tolerance, I & O's  REASON FOR ASSESSMENT:   Consult Enteral/tube feeding initiation and management  ASSESSMENT:   71 yo male with PMH of HTN, hypothyroidism, CAD, GERD, CHF, OSA, DM2, who was admitted on 6/25 with angioedema.  Received MD Consult for TF initiation and management. Discussed patient with RN today. Nutrition-Focused physical exam completed. Findings are no fat depletion, no muscle depletion, and no edema.  Patient is currently intubated on ventilator support Temp (24hrs), Avg:98.8 F (37.1 C), Min:98.5 F (36.9 C), Max:99.1 F (37.3 C)  Propofol: 10.4 ml/hr providing 275 kcal per day Labs reviewed: phosphorus 1.9 (L) CBG's: [phone] Medications reviewed and include solumedrol and propofol.  Diet Order:  Diet NPO time specified  Skin:  Reviewed, no issues  Last BM:  unknown  Height:   Ht Readings from Last 1 Encounters:  11/04/16 5\' 10"  (1.778 m)    Weight:   Wt Readings from Last 1 Encounters:  11/04/16 256 lb 2.8 oz (116.2 kg)    Ideal Body Weight:  75.5 kg  BMI:  Body mass index is 36.76 kg/m.  Estimated Nutritional Needs:   Kcal:  1275-1630  Protein:  150 gm  Fluid:  2 L  EDUCATION NEEDS:   No education needs identified at this time  Molli Barrows, [place], [place], [place] Pager [phone] After Hours Pager [phone]

## 2016-11-04 NOTE — ED Triage Notes (Signed)
Pt arrives to the ED via [facility] with reports of Respiratory Distress due to allergic reaction. Pt unsure of what he is allergic to. Pt arrives A&OX4 on arrival. Voice muffled and angioedema present. En route pt given 5mg  albuterol, 0.5mg  atrovent, 125mg  solumedrol, 4 doses of 1:1 Epinephrine, 3 doses 0.5mg  push pressure epinephrine, 50mg  Benadryl given via EMS PTA. Swelling did not respond. EDP at bedside.

## 2016-11-04 NOTE — H&P (Signed)
PULMONARY / CRITICAL CARE MEDICINE   Name: Joshua Lara MRN: [id number] DOB: [birth year]    ADMISSION DATE:  11/03/2016 CONSULTATION DATE:  11/04/2016  REFERRING MD:  Dr. Venora Maples   CHIEF COMPLAINT:  Angioedema   HISTORY OF PRESENT ILLNESS:  Information obtained from medical record as patient is intubated and sedated  71 year old male with PMH of CHF, CAD, Depression, GERD, HTN, Hypothyroidism, OSA (BiPAP at HS), DM, Chronic Lower Back Pain   Presents to ED on 6/26 with acute onset of tongue swelling, itching, and SOB.  Daughter and son at bedside report patient had just left around 10 pm from visiting his wife who was admitted this morning, stopped by [place], went home to walk the dogs and took his regular nightly medicines.  His son was with him when he started complaining of tongue swelling and itching.  Family denies patient having prior allergic reactions or angioedema, no family history of angioedema, prior food allergies, or knowledge of patient taking new medications.  Of note, patient was prescribed bactrim on 6/1 but they do not know why he was taking it.  A bottle is present that is half taken.  Patient reportedly told the EDP that he recently was placed on antibiotics, stopped taking them a few days ago, and then restarted 6/25.  EMS was called who administered EPI, Albuterol, Solumedrol and Benadryl. Upon arrival to ED patient had significant tongue swelling in which continued to worsen, was intubated for airway protection. PCCM was consulted.   PAST MEDICAL HISTORY :  He  has a past medical history of Arthritis; CHF (congestive heart failure) ([place]); Chronic lower back pain; Compression fracture of lumbar vertebra ([place]); Coronary artery disease; Daily headache; Depression; GERD (gastroesophageal reflux disease); High cholesterol; Hypertension; Hypothyroid; OSA treated with BiPAP; Pneumonia; and Type II diabetes mellitus ([place]).  PAST SURGICAL HISTORY: He  has a past surgical history  that includes Shoulder open rotator cuff repair (Left, 07/2010); Appendectomy; Tonsillectomy and adenoidectomy; Sphincterotomy; Cardiac catheterization (2001; 2009); Cataract extraction w/ intraocular lens implant (Left); Eye muscle surgery (Left); Cholecystectomy (1980's); Mastoidectomy (Right, 1970's); and left and right heart catheterization with coronary angiogram (N/A, 05/30/2014).  Allergies  Allergen Reactions  . Duloxetine Hcl Other (See Comments)    Made patient feel "spaced out"  . Etanercept Other (See Comments) and Cough    (ENBREL); Patient thinks it contributed to heart issues  . Tape Itching    Can only tolerate bandages for less than 24-hr periods    No current facility-administered medications on file prior to encounter.    Current Outpatient Prescriptions on File Prior to Encounter  Medication Sig  . aspirin EC 81 MG tablet Take 81 mg by mouth daily.    . baclofen (LIORESAL) 10 MG tablet Take 0.5 mg ( half tablet) twice a day and  A whole tablet at bedtime  . bimatoprost (LUMIGAN) 0.01 % SOLN Place 1 drop into both eyes at bedtime.   . Cholecalciferol (VITAMIN D3) 2000 units capsule Take 2,000 Units by mouth daily.  . clobetasol cream (TEMOVATE) 5.64 % Apply 1 application topically daily.   . Coenzyme Q10-Vitamin E (QUNOL ULTRA COQ10 PO) Take 100 mg by mouth daily.  . dorzolamide-timolol (COSOPT) 22.3-6.8 MG/ML ophthalmic solution Place 1 drop into both eyes 2 (two) times daily.  Marland Kitchen escitalopram (LEXAPRO) 10 MG tablet Take 10 mg by mouth daily.  . furosemide (LASIX) 40 MG tablet Take 1 tablet (40 mg total) by mouth daily.  . furosemide (LASIX) 80  MG tablet Take 80 mg by mouth daily.  Marland Kitchen gemfibrozil (LOPID) 600 MG tablet Take 600 mg by mouth 2 (two) times daily.  Marland Kitchen glipiZIDE (GLUCOTROL XL) 10 MG 24 hr tablet Take 10 mg by mouth 2 (two) times daily.  . INCRUSE ELLIPTA 62.5 MCG/INH AEPB INHALE 1 PUFF INTO THE LUNGS DAILY.  Marland Kitchen levothyroxine (SYNTHROID, LEVOTHROID) 100 MCG tablet  Take 100 mcg by mouth daily.    Marland Kitchen lidocaine (LIDODERM) 5 % Place 1 patch onto the skin daily. Remove & Discard patch within 12 hours or as directed by MD (Patient taking differently: Place 1 patch onto the skin daily as needed (for pain). Remove & Discard patch within 12 hours or as directed by MD)  . losartan (COZAAR) 25 MG tablet Take 25 mg by mouth daily.  . mometasone (NASONEX) 50 MCG/ACT nasal spray Place 2 sprays into the nose daily.    . nitroGLYCERIN (NITROSTAT) 0.4 MG SL tablet Place 0.4 mg under the tongue every 5 (five) minutes as needed for chest pain.  . ONE TOUCH ULTRA TEST test strip 2 (two) times daily. use for testing  . potassium chloride (MICRO-K) 10 MEQ CR capsule Take 10 mEq by mouth 2 (two) times daily.   . ranitidine (ZANTAC) 150 MG capsule Take 150 mg by mouth daily as needed for heartburn.   . sitaGLIPtan-metformin (JANUMET) 50-1000 MG per tablet Take 1 tablet by mouth 2 (two) times daily with a meal.    . traMADol (ULTRAM) 50 MG tablet Take 1 tablet (50 mg total) by mouth every 6 (six) hours as needed. (Patient taking differently: Take 50 mg by mouth every 6 (six) hours as needed (for pain). )    FAMILY HISTORY:  His indicated that his mother is deceased. He indicated that his father is deceased.    SOCIAL HISTORY: He  reports that he quit smoking about 26 years ago. His smoking use included Cigarettes. He has a 60.00 pack-year smoking history. He has never used smokeless tobacco. He reports that he drinks alcohol. He reports that he does not use drugs.  REVIEW OF SYSTEMS:   Unable to review as patient is intubated and sedated    SUBJECTIVE:   VITAL SIGNS: BP (!) 100/48   Pulse (!) 104   Temp 99.1 F (37.3 C) (Axillary)   Resp 20   Ht 5\' 9"  (1.753 m)   Wt 239 lb (108.4 kg)   SpO2 96%   BMI 35.29 kg/m   HEMODYNAMICS:    VENTILATOR SETTINGS: Vent Mode: PRVC FiO2 (%):  [40 %] 40 % Set Rate:  [16 bmp-20 bmp] 20 bmp Vt Set:  [600 mL] 600 mL PEEP:  [5  cmH20] 5 cmH20 Plateau Pressure:  [7 cmH20] 7 cmH20  INTAKE / OUTPUT: No intake/output data recorded.  PHYSICAL EXAMINATION: General:  Adult male lying on ED stretcher sedated and intubated HEENT: [place]/AT, MM pink/dry, thick tongue, mildly protruding, ETT, thick neck, PERRL  Neuro: sedated, not following commands CV: s1s2 rrr, no m/r/g PULM: even/non-labored on MV, lungs bilaterally clear with good air movement , no wheezing  NI:DPOE, bsx4 active  Extremities: warm/dry, no edema  Skin: no rashes or lesions   LABS:  BMET No results for input(s): NA, K, CL, CO2, BUN, CREATININE, GLUCOSE in the last 168 hours.  Electrolytes No results for input(s): CALCIUM, MG, PHOS in the last 168 hours.  CBC No results for input(s): WBC, HGB, HCT, PLT in the last 168 hours.  Coag's No results for  input(s): APTT, INR in the last 168 hours.  Sepsis Markers No results for input(s): LATICACIDVEN, PROCALCITON, O2SATVEN in the last 168 hours.  ABG  Recent Labs Lab 11/04/16 0041  PHART 7.264*  PCO2ART 48.6*  PO2ART 93.0    Liver Enzymes No results for input(s): AST, ALT, ALKPHOS, BILITOT, ALBUMIN in the last 168 hours.  Cardiac Enzymes No results for input(s): TROPONINI, PROBNP in the last 168 hours.  Glucose No results for input(s): GLUCAP in the last 168 hours.  Imaging Dg Chest Portable 1 View  Result Date: 11/04/2016 CLINICAL DATA:  Tongue swelling.  Respiratory distress.  Intubation. EXAM: PORTABLE CHEST 1 VIEW COMPARISON:  12/25/2015 FINDINGS: Endotracheal tube is 3.6 cm above the carina. Mild streaky opacities in the bases may represent scarring or atelectasis. No confluent consolidation. No large effusion. No pneumothorax. IMPRESSION: Satisfactory ET tube position.  No consolidation or large effusion. Electronically Signed   By: Andreas Newport M.D.   On: 11/04/2016 00:38     STUDIES:  CXR 6/26 >> satisfactory ETT placement, no acute process  CULTURES: None.    ANTIBIOTICS: None.   SIGNIFICANT EVENTS: 6/26 > Presents to ED with angioedema   LINES/TUBES: ETT 6/26 >>   DISCUSSION: 71 year old male presents to ED with angioedema requiring intubation for airway protection   ASSESSMENT / PLAN:  PULMONARY A: Intubated for airway protection in the setting of severe angioedema vs ? Allergic rxn Hx OSA - uses BiPAP  - patient has no prior hx, family hx of angioedema or allergic reaction, recently taking bactrim within last month, otherwise no new medications known P:   Full vent support PRVC 8 cc/kg Wean FiO2/ PEEP for sats > 92% CXR and ABG now, then trend Daily SBT however no extubation until angioedema resolves, ensure +cuff leak Solumedrol 40mg  q 6 Pepcid 20mg  BID Benadryl 25mg  q 6hr Duonebs q 4 prn  Pending differential to assess eosinophils   CARDIOVASCULAR A:  Allergic rxn vs angioedema s/p EPI  Sedation related hypotension Hx HTN, CHF (EF 60-65% on 1/16) , HLD P:  ICU monitoring Maintain MAP >65 Holding home losartan -? Cause for angioedema    RENAL A:   No acute issues P:   BMP pending   Trend BMP /mag/phos/ urinary output Replace electrolytes as indicated Avoid nephrotoxic agents, ensure adequate renal perfusion  GASTROINTESTINAL A:   Hx GERD P:   NPO Pepcid for SUP  HEMATOLOGIC A:   No acute issues  P:  CBC pending  Heparin SQ, SCDs  INFECTIOUS A:   No acute processes - recently rx bactrim 6/1 P:   Monitor fever curve Trend CBC   ENDOCRINE A:   DM Hypothyroid P:   SSI CBG q 4  Continue synthroid    NEUROLOGIC A:   Hx back pain, arthritis, depression, glaucoma P:   RASS goal: 0/-1 PAD protocol Fentanyl prn  Propofol gtt Daily WUA  FAMILY  - Updates: Patient's daughter and son at bedside.  Patients wife is currently admitted on [place] verbalize patient has known DNR wishes.  Will continue current medical care as this is most likely easily reversible.    -  Inter-disciplinary family meet or Palliative Care meeting due by:  11/10/16  CCT 45 mins  Kennieth Rad, [facility] and [place] Pager: [phone]  11/04/2016, 12:25 AM

## 2016-11-05 ENCOUNTER — Inpatient Hospital Stay (HOSPITAL_COMMUNITY): Payer: Medicare Other

## 2016-11-05 LAB — PHOSPHORUS: PHOSPHORUS: 2.1 mg/dL — AB (ref 2.5–4.6)

## 2016-11-05 LAB — CBC WITH DIFFERENTIAL/PLATELET
BASOS PCT: 0 %
Basophils Absolute: 0 10*3/uL (ref 0.0–0.1)
EOS ABS: 0 10*3/uL (ref 0.0–0.7)
Eosinophils Relative: 0 %
HEMATOCRIT: 36 % — AB (ref 39.0–52.0)
HEMOGLOBIN: 11.8 g/dL — AB (ref 13.0–17.0)
LYMPHS ABS: 0.8 10*3/uL (ref 0.7–4.0)
Lymphocytes Relative: 8 %
MCH: 28.4 pg (ref 26.0–34.0)
MCHC: 32.8 g/dL (ref 30.0–36.0)
MCV: 86.5 fL (ref 78.0–100.0)
MONO ABS: 0.6 10*3/uL (ref 0.1–1.0)
MONOS PCT: 5 %
NEUTROS ABS: 9.2 10*3/uL — AB (ref 1.7–7.7)
Neutrophils Relative %: 87 %
Platelets: 224 10*3/uL (ref 150–400)
RBC: 4.16 MIL/uL — ABNORMAL LOW (ref 4.22–5.81)
RDW: 14.4 % (ref 11.5–15.5)
WBC: 10.6 10*3/uL — ABNORMAL HIGH (ref 4.0–10.5)

## 2016-11-05 LAB — BASIC METABOLIC PANEL
Anion gap: 4 — ABNORMAL LOW (ref 5–15)
BUN: 21 mg/dL — ABNORMAL HIGH (ref 6–20)
CALCIUM: 9.4 mg/dL (ref 8.9–10.3)
CHLORIDE: 111 mmol/L (ref 101–111)
CO2: 25 mmol/L (ref 22–32)
CREATININE: 0.84 mg/dL (ref 0.61–1.24)
GFR calc Af Amer: >60 mL/min (ref >60)
GFR calc non Af Amer: >60 mL/min (ref >60)
GLUCOSE: 283 mg/dL — AB (ref 65–99)
Potassium: 4.2 mmol/L (ref 3.5–5.1)
Sodium: 140 mmol/L (ref 135–145)

## 2016-11-05 LAB — GLUCOSE, CAPILLARY
GLUCOSE-CAPILLARY: 189 mg/dL — AB (ref 65–99)
GLUCOSE-CAPILLARY: 208 mg/dL — AB (ref 65–99)
GLUCOSE-CAPILLARY: 225 mg/dL — AB (ref 65–99)
GLUCOSE-CAPILLARY: 240 mg/dL — AB (ref 65–99)
Glucose-Capillary: 261 mg/dL — ABNORMAL HIGH (ref 65–99)
Glucose-Capillary: 261 mg/dL — ABNORMAL HIGH (ref 65–99)
Glucose-Capillary: 200 mg/dL — ABNORMAL HIGH (ref 65–99)
Glucose-Capillary: 284 mg/dL — ABNORMAL HIGH (ref 65–99)

## 2016-11-05 LAB — MAGNESIUM: Magnesium: 2.3 mg/dL (ref 1.7–2.4)

## 2016-11-05 LAB — C4 COMPLEMENT: COMPLEMENT C4, BODY FLUID: 17 mg/dL (ref 14–44)

## 2016-11-05 LAB — COMPLEMENT, TOTAL: Compl, Total (CH50): >60 U/mL (ref >41)

## 2016-11-05 LAB — C1 ESTERASE INHIBITOR: C1 ESTERASE INH: 25 mg/dL (ref 21–39)

## 2016-11-05 LAB — C3 COMPLEMENT: C3 COMPLEMENT: 156 mg/dL (ref 82–167)

## 2016-11-05 MED ORDER — SODIUM CHLORIDE 0.45 % IV SOLN
INTRAVENOUS | Status: DC
  Administered 2016-11-05: 12:00:00 via INTRAVENOUS
  Administered 2016-11-06: 30 mL/h via INTRAVENOUS

## 2016-11-05 MED ORDER — ACETAMINOPHEN 325 MG PO TABS
650.0000 mg | ORAL_TABLET | Freq: Four times a day (QID) | ORAL | Status: DC | PRN
  Administered 2016-11-05: 650 mg via ORAL
  Filled 2016-11-05: qty 2

## 2016-11-05 MED ORDER — INSULIN GLARGINE 100 UNIT/ML ~~LOC~~ SOLN
15.0000 [IU] | Freq: Every day | SUBCUTANEOUS | Status: DC
  Administered 2016-11-05: 15 [IU] via SUBCUTANEOUS
  Filled 2016-11-05 (×2): qty 0.15

## 2016-11-05 MED ORDER — METHYLPREDNISOLONE SODIUM SUCC 40 MG IJ SOLR
40.0000 mg | Freq: Two times a day (BID) | INTRAMUSCULAR | Status: DC
  Administered 2016-11-05 – 2016-11-06 (×2): 40 mg via INTRAVENOUS
  Filled 2016-11-05 (×2): qty 1

## 2016-11-05 MED ORDER — SODIUM PHOSPHATES 45 MMOLE/15ML IV SOLN
10.0000 mmol | Freq: Once | INTRAVENOUS | Status: AC
  Administered 2016-11-05: 10 mmol via INTRAVENOUS
  Filled 2016-11-05 (×2): qty 3.33

## 2016-11-05 MED ORDER — INSULIN ASPART 100 UNIT/ML ~~LOC~~ SOLN
0.0000 [IU] | Freq: Three times a day (TID) | SUBCUTANEOUS | Status: DC
  Administered 2016-11-05: 5 [IU] via SUBCUTANEOUS

## 2016-11-05 NOTE — Progress Notes (Signed)
Positive cuff leak noted. MD aware. Pt to be extubated.

## 2016-11-05 NOTE — Evaluation (Signed)
Clinical/Bedside Swallow Evaluation Patient Details  Name: Joshua Lara MRN: [id number] Date of Birth: 12/16/1945  Today's Date: 11/05/2016 Time: SLP Start Time (ACUTE ONLY): 1350 SLP Stop Time (ACUTE ONLY): 1404 SLP Time Calculation (min) (ACUTE ONLY): 14 min  Past Medical History:  Past Medical History:  Diagnosis Date  . Arthritis    "hands, back, ankles" (05/11/2014)  . CHF (congestive heart failure) ([place])   . Chronic lower back pain   . Compression fracture of lumbar vertebra ([place])   . Coronary artery disease   . Daily headache    "recently" (05/11/2014)  . Depression    "I might be slight" (05/11/2014)  . GERD (gastroesophageal reflux disease)   . High cholesterol   . Hypertension   . Hypothyroid   . OSA treated with BiPAP   . Pneumonia    "couple times; last time was 03/2011" (05/11/2014)  . Type II diabetes mellitus ([place])    Past Surgical History:  Past Surgical History:  Procedure Laterality Date  . APPENDECTOMY    . CARDIAC CATHETERIZATION  2001; 2009  . CATARACT EXTRACTION W/ INTRAOCULAR LENS IMPLANT Left   . CHOLECYSTECTOMY  1980's   Archie Endo 07/13/2010   . EYE MUSCLE SURGERY Left   . LEFT AND RIGHT HEART CATHETERIZATION WITH CORONARY ANGIOGRAM N/A 05/30/2014   Procedure: LEFT AND RIGHT HEART CATHETERIZATION WITH CORONARY ANGIOGRAM;  Surgeon: Jolaine Artist, MD;  Location[facility] C[facility] LAB;  Service: Cardiovascular;  Laterality: N/A;  . MASTOIDECTOMY Right 1970's   Archie Endo 07/13/2010  . SHOULDER OPEN ROTATOR CUFF REPAIR Left 07/2010  . SPHINCTEROTOMY    . TONSILLECTOMY AND ADENOIDECTOMY     HPI:  71 year old male with PMH of CHF, CAD, Depression, GERD, HTN, Hypothyroidism, OSA (BiPAP at HS), DM, Chronic Lower Back Pain, admitted with angioedema requiring intubation for airway protection.  ETT 6/26-6/27.    Assessment / Plan / Recommendation Clinical Impression  Pt presents with normal oropharyngeal swallow s/p brief intubation; oral cavity wnl - no further  edema of tongue.  Consumed regular solids, purees, thin liquids with adequate mastication, brisk swallow, no s/s of aspiration.  Passed three oz swallow test without deficit.  Resume regular diet.  Family will bring in dentures.  No SLP f/u warranted.  SLP Visit Diagnosis: Dysphagia, unspecified (R13.10)    Aspiration Risk  No limitations    Diet Recommendation   regular solids, thin liquids  Medication Administration: Whole meds with liquid    Other  Recommendations Oral Care Recommendations: Oral care BID   Follow up Recommendations        Frequency and Duration            Prognosis        Swallow Study   General Date of Onset: 11/04/16 HPI: 71 year old male with PMH of CHF, CAD, Depression, GERD, HTN, Hypothyroidism, OSA (BiPAP at HS), DM, Chronic Lower Back Pain, admitted with angioedema requiring intubation for airway protection.  ETT 6/26-6/27.  Type of Study: Bedside Swallow Evaluation Diet Prior to this Study: NPO Temperature Spikes Noted: No History of Recent Intubation: Yes Length of Intubations (days): 1 days Date extubated: 11/05/16 Behavior/Cognition: Alert;Cooperative;Pleasant mood Oral Cavity Assessment: Within Functional Limits Oral Care Completed by SLP: No Oral Cavity - Dentition: Edentulous Vision: Functional for self-feeding Self-Feeding Abilities: Able to feed self Patient Positioning: Upright in bed Baseline Vocal Quality: Normal Volitional Cough: Strong Volitional Swallow: Able to elicit    Oral/Motor/Sensory Function Overall Oral Motor/Sensory Function: Within functional limits  Ice Chips Ice chips: Within functional limits   Thin Liquid Thin Liquid: Within functional limits    Nectar Thick Nectar Thick Liquid: Not tested   Honey Thick Honey Thick Liquid: Not tested   Puree Puree: Within functional limits   Solid   GO   Solid: Within functional limits        Joshua Lara 11/05/2016,2:27 PM

## 2016-11-05 NOTE — Discharge Summary (Signed)
Physician Discharge Summary  Patient ID: Joshua Lara MRN: [id number] DOB/AGE: 12-06-1945 71 y.o.  Admit date: 11/03/2016 Discharge date: 11/06/2016    Discharge Diagnoses:  Angioedema - resolved.  ? Due to Bactrim. OSA - on nocturnal CPAP. HTN, CHF (EF 60 - 65% on 05/27/16), HLD. GERD. DM. Hypothyroidism. Back pain, arthritis, depression, glaucoma.                                                                DISCHARGE PLAN BY DIAGNOSIS    Angioedema - resolved.  ? Due to Bactrim. OSA - on nocturnal CPAP. Plan: Avoid bactrim (placed on allergy list). Continue prednisone taper as instructed (20 tabs total). Continue nocturnal CPAP.  HTN, CHF (EF 60 - 65% on 05/27/16), HLD. Plan: Continue preadmission ASA, furosemide, gemfibrizol, co-enzyme Q10.  GERD. Plan: Continue preadmission ranitidine.  DM. Hypothyroidism. Plan: Continue preadmission , glipizide, levemir, janumet, synthroid.   Back pain, arthritis, depression, glaucoma. Plan: Continue preadmission baclofen, escitalopram,                  DISCHARGE SUMMARY   Joshua Lara is a 71 y.o. y/o male with a PMH of CHF, CAD, Depression, GERD, HTN, Hypothyroidism, OSA (BiPAP at HS), DM, Chronic Lower Back Pain.  He presented to [facility] ED on 6/26 with acute onset of tongue swelling, itching, and SOB.  Daughter and son at bedside report patient had just left around 10 pm from visiting his wife who was admitted earlier that morning, stopped by [place], went home to walk the dogs and took his regular nightly medicines.  His son was with him when he started complaining of tongue swelling and itching.  Family denies patient having prior allergic reactions or angioedema, no family history of angioedema, prior food allergies, or knowledge of patient taking new medications.  Of note, patient was prescribed bactrim on 6/1 but they do not know why he was taking it.  A bottle is present that is half taken.  Patient reportedly told the  EDP that he recently was placed on antibiotics, stopped taking them a few days ago, and then restarted 6/25.  EMS was called who administered EPI, Albuterol, Solumedrol and Benadryl. Upon arrival to ED patient had significant tongue swelling in which continued to worsen, was intubated for airway protection. PCCM was consulted for admission.  He was admitted to the ICU and treated with steroids, H1 blockers, H2 blockers.  He responded well and was extubated 11/05/16.  He was later transferred out of the ICU to a normal floor bed.  On 6/28, he was deemed medically stable and cleared for discharge home.   SIGNIFICANT DIAGNOSTIC STUDIES CXR 6/26 > satisfactory ETT placement, no acute process  SIGNIFICANT EVENTS 6/26 > admit, intubated. 6/27 > extubated, transferred to floor. 6/28 > discharged.  MICRO DATA  None.  ANTIBIOTICS None.  CONSULTS None.  TUBES / LINES ETT 6/26 > 6/27.   Discharge Exam: General: Adult male, in NAD. Neuro: A&O x 3, no focal deficits. HEENT: [place]/AT. MMM. Cardiovascular: RRR, no M/R/G. Lungs: Normal respiratory effort.  Clear bilaterally. Abdomen: BS x 4, S/NT/ND. Musculoskeletal: No deformities, no edema. Skin: Warm, dry.    Vitals:   11/05/16 2316 11/06/16 0015 11/06/16 0319 11/06/16 0640  BP:  126/74  Pulse:  81  65  Resp:  (!) 27  (!) 22  Temp: 99 F (37.2 C)  98.7 F (37.1 C) 97.9 F (36.6 C)  TempSrc: Axillary  Axillary Oral  SpO2:  96%  98%  Weight:      Height:         Discharge Labs  BMET  Recent Labs Lab 11/04/16 0244 11/04/16 1004 11/04/16 1457 11/04/16 1620 11/05/16 0453  NA 138  --  140  --  140  K 3.4*  --  4.3  --  4.2  CL 106  --  111  --  111  CO2 20*  --  21*  --  25  GLUCOSE 266*  --  248*  --  283*  BUN 14  --  12  --  21*  CREATININE 0.91  --  0.88  --  0.84  CALCIUM 9.0  --  9.6  --  9.4  MG 1.7 1.9  --  2.0 2.3  PHOS 3.0 1.9*  --  1.7* 2.1*    CBC  Recent Labs Lab 11/04/16 0244  11/04/16 0334 11/05/16 0453  HGB 12.5* 12.2* 11.8*  HCT 36.3*  --  36.0*  WBC 10.5  --  10.6*  PLT 211  --  224    Anti-Coagulation No results for input(s): INR in the last 168 hours.  Discharge Instructions    Call MD for:  difficulty breathing, headache or visual disturbances    Complete by:  As directed    Diet - low sodium heart healthy    Complete by:  As directed    Increase activity slowly    Complete by:  As directed           Allergies as of 11/06/2016      Reactions   Bactrim [sulfamethoxazole-trimethoprim] Swelling   Duloxetine Hcl Other (See Comments)   Made patient feel "spaced out"   Etanercept Other (See Comments), Cough   (ENBREL); Patient thinks it contributed to heart issues   Tape Itching   Can only tolerate bandages for less than 24-hr periods      Medication List    STOP taking these medications   sulfamethoxazole-trimethoprim 800-160 MG tablet Commonly known as:  BACTRIM DS,SEPTRA DS     TAKE these medications   aspirin EC 81 MG tablet Take 81 mg by mouth daily.   baclofen 10 MG tablet Commonly known as:  LIORESAL Take 0.5 mg ( half tablet) twice a day and  A whole tablet at bedtime What changed:  how much to take  how to take this  when to take this  reasons to take this  additional instructions   bimatoprost 0.01 % Soln Commonly known as:  LUMIGAN Place 1 drop into both eyes at bedtime.   clobetasol cream 0.05 % Commonly known as:  TEMOVATE Apply 1 application topically daily as needed (rash).   COSOPT 22.3-6.8 MG/ML ophthalmic solution Generic drug:  dorzolamide-timolol Place 1 drop into both eyes 2 (two) times daily.   escitalopram 10 MG tablet Commonly known as:  LEXAPRO Take 10 mg by mouth daily.   furosemide 80 MG tablet Commonly known as:  LASIX Take 80 mg by mouth daily. What changed:  Another medication with the same name was changed. Make sure you understand how and when to take each.   furosemide 40  MG tablet Commonly known as:  LASIX Take 1 tablet (40 mg total) by mouth daily. What changed:  how much to take   gemfibrozil 600 MG tablet Commonly known as:  LOPID Take 600 mg by mouth 2 (two) times daily.   glipiZIDE 10 MG 24 hr tablet Commonly known as:  GLUCOTROL XL Take 10 mg by mouth daily with breakfast.   INCRUSE ELLIPTA 62.5 MCG/INH Aepb Generic drug:  umeclidinium bromide INHALE 1 PUFF INTO THE LUNGS DAILY AS NEEDED FOR SHORTNESS OF BREATH   insulin detemir 100 UNIT/ML injection Commonly known as:  LEVEMIR Inject into the skin 2 (two) times daily.   levothyroxine 100 MCG tablet Commonly known as:  SYNTHROID, LEVOTHROID Take 100 mcg by mouth daily.   lidocaine 5 % Commonly known as:  LIDODERM Place 1 patch onto the skin daily. Remove & Discard patch within 12 hours or as directed by MD What changed:  when to take this  reasons to take this  additional instructions   meloxicam 15 MG tablet Commonly known as:  MOBIC Take 15 mg by mouth daily as needed for pain.   mometasone 50 MCG/ACT nasal spray Commonly known as:  NASONEX Place 2 sprays into the nose daily as needed (allergies).   nitroGLYCERIN 0.4 MG SL tablet Commonly known as:  NITROSTAT Place 0.4 mg under the tongue every 5 (five) minutes as needed for chest pain.   potassium chloride 10 MEQ CR capsule Commonly known as:  MICRO-K Take 20 mEq by mouth daily.   predniSONE 10 MG tablet Commonly known as:  DELTASONE Take 1 tablet (10 mg total) by mouth as directed. See taper instructions below   QUNOL ULTRA COQ10 PO Take 100 mg by mouth daily.   ranitidine 150 MG capsule Commonly known as:  ZANTAC Take 150 mg by mouth daily as needed for heartburn.   sitaGLIPtin-metformin 50-1000 MG tablet Commonly known as:  JANUMET Take 1 tablet by mouth 2 (two) times daily with a meal.   traMADol 50 MG tablet Commonly known as:  ULTRAM Take 1 tablet (50 mg total) by mouth every 6 (six) hours as  needed. What changed:  reasons to take this   Vitamin D3 2000 units capsule Take 2,000 Units by mouth daily.        Disposition: Home.  Discharged Condition: Joshua Lara has met maximum benefit of inpatient care and is medically stable and cleared for discharge.  Patient is pending follow up as above.      Time spent on disposition:  Greater than 35 minutes.    Montey Hora, [place] [facility] Pager: [phone]  or [phone] 11/06/2016, 7:54 AM

## 2016-11-05 NOTE — Progress Notes (Signed)
PULMONARY / CRITICAL CARE MEDICINE   Name: Joshua Lara MRN: [id number] DOB: [birth year]    ADMISSION DATE:  11/03/2016 CONSULTATION DATE:  11/04/2016  REFERRING MD:  Dr. Venora Maples   CHIEF COMPLAINT:  Angioedema   HISTORY OF PRESENT ILLNESS:  Information obtained from medical record as patient is intubated and sedated  71 year old male with PMH of CHF, CAD, Depression, GERD, HTN, Hypothyroidism, OSA (BiPAP at HS), DM, Chronic Lower Back Pain   Presents to ED on 6/26 with acute onset of tongue swelling, itching, and SOB.  Daughter and son at bedside report patient had just left around 10 pm from visiting his wife who was admitted this morning, stopped by [place], went home to walk the dogs and took his regular nightly medicines.  His son was with him when he started complaining of tongue swelling and itching.  Family denies patient having prior allergic reactions or angioedema, no family history of angioedema, prior food allergies, or knowledge of patient taking new medications.  Of note, patient was prescribed bactrim on 6/1 but they do not know why he was taking it.  A bottle is present that is half taken.  Patient reportedly told the EDP that he recently was placed on antibiotics, stopped taking them a few days ago, and then restarted 6/25.  EMS was called who administered EPI, Albuterol, Solumedrol and Benadryl. Upon arrival to ED patient had significant tongue swelling in which continued to worsen, was intubated for airway protection. PCCM was consulted.    SUBJECTIVE:  Improved edema , weaning, leaking present  VITAL SIGNS: BP (!) 141/93   Pulse 79   Temp 99 F (37.2 C) (Oral)   Resp 19   Ht 5\' 10"  (1.778 m)   Wt 114.1 kg (251 lb 8.7 oz)   SpO2 98%   BMI 36.09 kg/m   HEMODYNAMICS:    VENTILATOR SETTINGS: Vent Mode: PRVC FiO2 (%):  [30 %] 30 % Set Rate:  [20 bmp] 20 bmp Vt Set:  [600 mL] 600 mL PEEP:  [5 cmH20] 5 cmH20 Pressure Support:  [5 cmH20] 5 cmH20 Plateau  Pressure:  [14 cmH20-18 cmH20] 16 cmH20  INTAKE / OUTPUT: I/O last 3 completed shifts: In: 1625.2 [I.V.:609.9; NG/GT:965.3; IV Piggyback:50] Out: 3500 [Urine:3500]  PHYSICAL EXAMINATION: General: awake, FC  well Neuro: nonfocla, rass 0 HEENT: slight swelling neck - resolved PULM: no wheezing, CTA CV: s1 s2 RRR no r distant GI: soft, BS wnl, no r/g Extremities: no edmea    LABS:  BMET  Recent Labs Lab 11/04/16 0244 11/04/16 1457 11/05/16 0453  NA 138 140 140  K 3.4* 4.3 4.2  CL 106 111 111  CO2 20* 21* 25  BUN 14 12 21*  CREATININE 0.91 0.88 0.84  GLUCOSE 266* 248* 283*    Electrolytes  Recent Labs Lab 11/04/16 0244 11/04/16 1004 11/04/16 1457 11/04/16 1620 11/05/16 0453  CALCIUM 9.0  --  9.6  --  9.4  MG 1.7 1.9  --  2.0 2.3  PHOS 3.0 1.9*  --  1.7* 2.1*    CBC  Recent Labs Lab 11/04/16 0244 11/05/16 0453  WBC 10.5 10.6*  HGB 12.5* 11.8*  HCT 36.3* 36.0*  PLT 211 224    Coag's No results for input(s): APTT, INR in the last 168 hours.  Sepsis Markers  Recent Labs Lab 11/04/16 0334 11/04/16 0720  LATICACIDVEN 4.5* 3.3*    ABG  Recent Labs Lab 11/04/16 0041  PHART 7.264*  PCO2ART 48.6*  PO2ART  93.0    Liver Enzymes  Recent Labs Lab 11/04/16 0334  AST 33  ALT 24  ALKPHOS 92  BILITOT 0.8  ALBUMIN 3.6    Cardiac Enzymes  Recent Labs Lab 11/04/16 0334  TROPONINI <0.03    Glucose  Recent Labs Lab 11/04/16 1143 11/04/16 1525 11/04/16 2024 11/05/16 0009 11/05/16 0434 11/05/16 0755  GLUCAP 277* 232* 218* 208* 261* 261*    Imaging Dg Chest Port 1 View  Result Date: 11/05/2016 CLINICAL DATA:  Respiratory failure. EXAM: PORTABLE CHEST 1 VIEW COMPARISON:  11/04/2016 .  CT 12/25/2015. FINDINGS: Interim placement NG tube, its tip is below left hemidiaphragm. Endotracheal tube in stable position. Cardiomegaly with normal pulmonary vascularity. Mild right base subsegmental atelectasis. Stable calcified nodule left  upper lobe . No pleural effusion or pneumothorax . IMPRESSION: 1. Interim placement of NG tube, its tip is below left hemidiaphragm . Endotracheal tube in stable position. 2.  Mild right base subsegmental atelectasis. 3. Stable cardiomegaly . Electronically Signed   By: Marcello Moores  Register   On: 11/05/2016 07:04     STUDIES:  CXR 6/26 >> satisfactory ETT placement, no acute process  CULTURES: None.   ANTIBIOTICS: None.   SIGNIFICANT EVENTS: 6/26 > Presents to ED with angioedema   LINES/TUBES: ETT 6/26 >>   DISCUSSION: 71 year old male presents to ED with angioedema requiring intubation for airway protection   ASSESSMENT / PLAN:  PULMONARY A: Intubated for airway protection in the setting of severe angioedema secondary to bactrim likely  P:   Weaning cpap 5 ps5, goal 30 min  Dc all sedation, WUA Assess leak Clinical status appears that edema has resolved  Allow beandryl to dc pepcid Remains steroids to q12h  CARDIOVASCULAR A:  Allergic rxn vs angioedema s/p EPI  Sedation related hypotension Hx HTN, CHF (EF 60-65% on 1/16) , HLD P:  ICU monitoring Maintain MAP >65 Had neg balance on own, allow   RENAL A:   Hypokalemia resolved Neg balance on own hyperchloremia P:   BMP in pm  kvo Avoid saline Add 1/2 NS at 30   GASTROINTESTINAL A:   Hx GERD R/o dysphagia P:   NPO Pepcid for SUP T F held for weaning Will need SLP  HEMATOLOGIC A:   DVt prevention P:  Limit phlebotomy  Heparin SQ, SCDs  INFECTIOUS A:   No acute processes - recently rx bactrim 6/1 P:   Follow temp curve Bactrim allergy  ENDOCRINE A:   DM Hypothyroid hyperglcyemia P:   SSI CBG q 4 and T fcoverage Lantus, hold increase as NPO Continue synthroid   NEUROLOGIC A:   Hx back pain, arthritis, depression, glaucoma P:   RASS goal:0 Fentanyl prn  Propofol gtt to dc Daily WUA  FAMILY  - Updates: to pt  - Inter-disciplinary family meet or Palliative Care meeting  due by:  11/10/16  CCM 35 min    Lavon Paganini. Titus Mould, MD, [place] Pgr: [place] [facility]

## 2016-11-05 NOTE — Procedures (Signed)
Extubation Procedure Note  Patient Details:   Name: Joshua Lara DOB: 1945-12-09 MRN: [id number]   Airway Documentation:     Evaluation  O2 sats: stable throughout Complications: No apparent complications Patient did tolerate procedure well. Bilateral Breath Sounds: Clear, Diminished   Yes   Pt extubated to 3L [place] per MD order. Pt stable throughout with no complications. VS within normal limits, positive cuff leak noted, pt able to speak and has a weak non productive cough post extubation. Pt encouraged to use [place] to remove secretions. RT will continue to monitor.   Laymond Purser M 11/05/2016, 11:09 AM

## 2016-11-06 LAB — GLUCOSE, CAPILLARY
Glucose-Capillary: 283 mg/dL — ABNORMAL HIGH (ref 65–99)
Glucose-Capillary: 182 mg/dL — ABNORMAL HIGH (ref 65–99)

## 2016-11-06 LAB — GLUCOSE 6 PHOSPHATE DEHYDROGENASE
G6PDH: 10.3 U/g{Hb} (ref 4.6–13.5)
HEMOGLOBIN: 12.2 g/dL — AB (ref 13.0–17.7)

## 2016-11-06 MED ORDER — PREDNISONE 10 MG PO TABS: 10.0000 mg | ORAL_TABLET | ORAL | 0 refills | Status: DC

## 2016-11-06 MED ORDER — INSULIN ASPART 100 UNIT/ML ~~LOC~~ SOLN
0.0000 [IU] | SUBCUTANEOUS | Status: DC
  Administered 2016-11-06: 11 [IU] via SUBCUTANEOUS

## 2016-11-06 MED ORDER — DOCUSATE SODIUM 100 MG PO CAPS
100.0000 mg | ORAL_CAPSULE | Freq: Two times a day (BID) | ORAL | Status: DC | PRN
  Administered 2016-11-06: 100 mg via ORAL
  Filled 2016-11-06: qty 1

## 2016-11-06 NOTE — Progress Notes (Signed)
Patient admitted to room 6N14 from 2M07 via bed. Alert and oriented x4.Vital signs stable. Oriented to room and call bell. Denies pain.

## 2016-11-06 NOTE — Care Management Important Message (Signed)
Important Message  Patient Details  Name: Joshua Lara MRN: [id number] Date of Birth: [birth year]   Medicare Important Message Given:  Yes    Ella Bodo, RN 11/06/2016, 10:56 AM

## 2016-11-06 NOTE — Progress Notes (Signed)
Patient discharged to home with instructions. 

## 2016-11-06 NOTE — Progress Notes (Signed)
This note also relates to the following rows which could not be included: Pulse Rate - Cannot attach notes to unvalidated device data Resp - Cannot attach notes to unvalidated device data SpO2 - Cannot attach notes to unvalidated device data RT applied BiPAP of 15/8 per patient home settings and order via a FFM.  Patient is tolerating at this time.     11/06/16 0015  BiPAP/CPAP/SIPAP  BiPAP/CPAP/SIPAP Pt Type Adult  Mask Type Full face mask  Mask Size Medium  Respiratory Rate 27 breaths/min  IPAP 15 cmH20  EPAP 8 cmH2O  Oxygen Percent 21 %  BiPAP/CPAP/SIPAP BiPAP  Patient Home Equipment No  Auto Titrate No  BiPAP/CPAP /SiPAP Vitals  Bilateral Breath Sounds Clear;Diminished

## 2016-11-11 DIAGNOSIS — M109 Gout, unspecified: Secondary | ICD-10-CM | POA: Diagnosis not present

## 2016-11-11 DIAGNOSIS — Z79899 Other long term (current) drug therapy: Secondary | ICD-10-CM | POA: Diagnosis not present

## 2016-11-11 DIAGNOSIS — E785 Hyperlipidemia, unspecified: Secondary | ICD-10-CM | POA: Diagnosis not present

## 2016-11-11 DIAGNOSIS — J449 Chronic obstructive pulmonary disease, unspecified: Secondary | ICD-10-CM | POA: Diagnosis not present

## 2016-11-11 DIAGNOSIS — E1136 Type 2 diabetes mellitus with diabetic cataract: Secondary | ICD-10-CM | POA: Diagnosis not present

## 2016-11-11 DIAGNOSIS — Z881 Allergy status to other antibiotic agents status: Secondary | ICD-10-CM | POA: Diagnosis not present

## 2016-11-11 DIAGNOSIS — R911 Solitary pulmonary nodule: Secondary | ICD-10-CM | POA: Diagnosis not present

## 2016-11-11 DIAGNOSIS — E039 Hypothyroidism, unspecified: Secondary | ICD-10-CM | POA: Diagnosis not present

## 2016-11-11 DIAGNOSIS — I110 Hypertensive heart disease with heart failure: Secondary | ICD-10-CM | POA: Diagnosis not present

## 2016-11-11 DIAGNOSIS — Z87891 Personal history of nicotine dependence: Secondary | ICD-10-CM | POA: Diagnosis not present

## 2016-11-11 DIAGNOSIS — Z794 Long term (current) use of insulin: Secondary | ICD-10-CM | POA: Diagnosis not present

## 2016-11-11 DIAGNOSIS — I2510 Atherosclerotic heart disease of native coronary artery without angina pectoris: Secondary | ICD-10-CM | POA: Diagnosis not present

## 2016-11-11 DIAGNOSIS — R590 Localized enlarged lymph nodes: Secondary | ICD-10-CM | POA: Diagnosis not present

## 2016-11-11 DIAGNOSIS — I10 Essential (primary) hypertension: Secondary | ICD-10-CM | POA: Diagnosis not present

## 2016-11-11 DIAGNOSIS — K219 Gastro-esophageal reflux disease without esophagitis: Secondary | ICD-10-CM | POA: Diagnosis not present

## 2016-11-11 DIAGNOSIS — Z9989 Dependence on other enabling machines and devices: Secondary | ICD-10-CM | POA: Diagnosis not present

## 2016-11-11 DIAGNOSIS — G4733 Obstructive sleep apnea (adult) (pediatric): Secondary | ICD-10-CM | POA: Diagnosis not present

## 2016-11-11 DIAGNOSIS — Z7982 Long term (current) use of aspirin: Secondary | ICD-10-CM | POA: Diagnosis not present

## 2016-11-11 DIAGNOSIS — I509 Heart failure, unspecified: Secondary | ICD-10-CM | POA: Diagnosis not present

## 2016-11-11 LAB — COMPLEMENT COMPONENT C1Q: C1Q COMPLEMENT PROTEIN CC1Q: 14.5 mg/dL (ref 11.8–23.8)

## 2016-12-05 DIAGNOSIS — H401134 Primary open-angle glaucoma, bilateral, indeterminate stage: Secondary | ICD-10-CM | POA: Diagnosis not present

## 2016-12-24 DIAGNOSIS — H401134 Primary open-angle glaucoma, bilateral, indeterminate stage: Secondary | ICD-10-CM | POA: Diagnosis not present

## 2016-12-24 DIAGNOSIS — Z961 Presence of intraocular lens: Secondary | ICD-10-CM | POA: Diagnosis not present

## 2016-12-24 DIAGNOSIS — E119 Type 2 diabetes mellitus without complications: Secondary | ICD-10-CM | POA: Diagnosis not present

## 2017-01-01 DIAGNOSIS — E039 Hypothyroidism, unspecified: Secondary | ICD-10-CM | POA: Diagnosis not present

## 2017-01-01 DIAGNOSIS — F419 Anxiety disorder, unspecified: Secondary | ICD-10-CM | POA: Diagnosis not present

## 2017-01-01 DIAGNOSIS — I10 Essential (primary) hypertension: Secondary | ICD-10-CM | POA: Diagnosis not present

## 2017-01-01 DIAGNOSIS — I5030 Unspecified diastolic (congestive) heart failure: Secondary | ICD-10-CM | POA: Diagnosis not present

## 2017-01-01 DIAGNOSIS — Z79899 Other long term (current) drug therapy: Secondary | ICD-10-CM | POA: Diagnosis not present

## 2017-01-01 DIAGNOSIS — I872 Venous insufficiency (chronic) (peripheral): Secondary | ICD-10-CM | POA: Diagnosis not present

## 2017-01-01 DIAGNOSIS — E119 Type 2 diabetes mellitus without complications: Secondary | ICD-10-CM | POA: Diagnosis not present

## 2017-02-09 ENCOUNTER — Other Ambulatory Visit: Payer: Self-pay | Admitting: Registered Nurse

## 2017-02-17 DIAGNOSIS — Z8701 Personal history of pneumonia (recurrent): Secondary | ICD-10-CM | POA: Diagnosis not present

## 2017-02-17 DIAGNOSIS — J069 Acute upper respiratory infection, unspecified: Secondary | ICD-10-CM | POA: Diagnosis not present

## 2017-03-17 DIAGNOSIS — J432 Centrilobular emphysema: Secondary | ICD-10-CM | POA: Diagnosis not present

## 2017-03-17 DIAGNOSIS — I2510 Atherosclerotic heart disease of native coronary artery without angina pectoris: Secondary | ICD-10-CM | POA: Diagnosis not present

## 2017-03-17 DIAGNOSIS — K229 Disease of esophagus, unspecified: Secondary | ICD-10-CM | POA: Diagnosis not present

## 2017-03-17 DIAGNOSIS — R590 Localized enlarged lymph nodes: Secondary | ICD-10-CM | POA: Diagnosis not present

## 2017-03-19 DIAGNOSIS — Z23 Encounter for immunization: Secondary | ICD-10-CM | POA: Diagnosis not present

## 2017-04-01 DIAGNOSIS — M25551 Pain in right hip: Secondary | ICD-10-CM | POA: Diagnosis not present

## 2017-04-01 DIAGNOSIS — S5011XA Contusion of right forearm, initial encounter: Secondary | ICD-10-CM | POA: Diagnosis not present

## 2017-04-01 DIAGNOSIS — S79911A Unspecified injury of right hip, initial encounter: Secondary | ICD-10-CM | POA: Diagnosis not present

## 2017-04-07 DIAGNOSIS — K224 Dyskinesia of esophagus: Secondary | ICD-10-CM | POA: Diagnosis not present

## 2017-04-10 DIAGNOSIS — R51 Headache: Secondary | ICD-10-CM | POA: Diagnosis not present

## 2017-04-10 DIAGNOSIS — B349 Viral infection, unspecified: Secondary | ICD-10-CM | POA: Diagnosis not present

## 2017-04-10 DIAGNOSIS — R42 Dizziness and giddiness: Secondary | ICD-10-CM | POA: Diagnosis not present

## 2017-04-10 DIAGNOSIS — E162 Hypoglycemia, unspecified: Secondary | ICD-10-CM | POA: Diagnosis not present

## 2017-04-14 DIAGNOSIS — E039 Hypothyroidism, unspecified: Secondary | ICD-10-CM | POA: Diagnosis not present

## 2017-04-14 DIAGNOSIS — G4733 Obstructive sleep apnea (adult) (pediatric): Secondary | ICD-10-CM | POA: Diagnosis not present

## 2017-04-14 DIAGNOSIS — I5030 Unspecified diastolic (congestive) heart failure: Secondary | ICD-10-CM | POA: Diagnosis not present

## 2017-04-14 DIAGNOSIS — E119 Type 2 diabetes mellitus without complications: Secondary | ICD-10-CM | POA: Diagnosis not present

## 2017-04-14 DIAGNOSIS — R5383 Other fatigue: Secondary | ICD-10-CM | POA: Diagnosis not present

## 2017-04-14 DIAGNOSIS — E782 Mixed hyperlipidemia: Secondary | ICD-10-CM | POA: Diagnosis not present

## 2017-04-14 DIAGNOSIS — F419 Anxiety disorder, unspecified: Secondary | ICD-10-CM | POA: Diagnosis not present

## 2017-04-14 DIAGNOSIS — Z6837 Body mass index (BMI) 37.0-37.9, adult: Secondary | ICD-10-CM | POA: Diagnosis not present

## 2017-06-11 DIAGNOSIS — R590 Localized enlarged lymph nodes: Secondary | ICD-10-CM | POA: Diagnosis not present

## 2017-06-11 DIAGNOSIS — R911 Solitary pulmonary nodule: Secondary | ICD-10-CM | POA: Diagnosis not present

## 2017-06-11 DIAGNOSIS — Z6836 Body mass index (BMI) 36.0-36.9, adult: Secondary | ICD-10-CM | POA: Diagnosis not present

## 2017-06-11 DIAGNOSIS — Z6839 Body mass index (BMI) 39.0-39.9, adult: Secondary | ICD-10-CM | POA: Diagnosis not present

## 2017-06-11 DIAGNOSIS — G4733 Obstructive sleep apnea (adult) (pediatric): Secondary | ICD-10-CM | POA: Diagnosis not present

## 2017-06-11 DIAGNOSIS — E669 Obesity, unspecified: Secondary | ICD-10-CM | POA: Diagnosis not present

## 2017-06-16 DIAGNOSIS — I10 Essential (primary) hypertension: Secondary | ICD-10-CM | POA: Diagnosis not present

## 2017-06-16 DIAGNOSIS — R42 Dizziness and giddiness: Secondary | ICD-10-CM | POA: Diagnosis not present

## 2017-06-16 DIAGNOSIS — G4733 Obstructive sleep apnea (adult) (pediatric): Secondary | ICD-10-CM | POA: Diagnosis not present

## 2017-06-16 DIAGNOSIS — E119 Type 2 diabetes mellitus without complications: Secondary | ICD-10-CM | POA: Diagnosis not present

## 2017-06-16 DIAGNOSIS — Z6838 Body mass index (BMI) 38.0-38.9, adult: Secondary | ICD-10-CM | POA: Diagnosis not present

## 2017-07-21 DIAGNOSIS — Z6838 Body mass index (BMI) 38.0-38.9, adult: Secondary | ICD-10-CM | POA: Diagnosis not present

## 2017-07-21 DIAGNOSIS — I10 Essential (primary) hypertension: Secondary | ICD-10-CM | POA: Diagnosis not present

## 2017-07-21 DIAGNOSIS — I5030 Unspecified diastolic (congestive) heart failure: Secondary | ICD-10-CM | POA: Diagnosis not present

## 2017-07-21 DIAGNOSIS — G4733 Obstructive sleep apnea (adult) (pediatric): Secondary | ICD-10-CM | POA: Diagnosis not present

## 2017-07-21 DIAGNOSIS — E119 Type 2 diabetes mellitus without complications: Secondary | ICD-10-CM | POA: Diagnosis not present

## 2017-07-21 DIAGNOSIS — E669 Obesity, unspecified: Secondary | ICD-10-CM | POA: Diagnosis not present

## 2017-08-14 DIAGNOSIS — R05 Cough: Secondary | ICD-10-CM | POA: Diagnosis not present

## 2017-08-14 DIAGNOSIS — K219 Gastro-esophageal reflux disease without esophagitis: Secondary | ICD-10-CM | POA: Diagnosis not present

## 2017-08-14 DIAGNOSIS — K229 Disease of esophagus, unspecified: Secondary | ICD-10-CM | POA: Diagnosis not present

## 2017-09-03 DIAGNOSIS — Z1211 Encounter for screening for malignant neoplasm of colon: Secondary | ICD-10-CM | POA: Diagnosis not present

## 2017-09-03 DIAGNOSIS — K648 Other hemorrhoids: Secondary | ICD-10-CM | POA: Diagnosis not present

## 2017-09-03 DIAGNOSIS — R1310 Dysphagia, unspecified: Secondary | ICD-10-CM | POA: Diagnosis not present

## 2017-09-03 DIAGNOSIS — Z8371 Family history of colonic polyps: Secondary | ICD-10-CM | POA: Diagnosis not present

## 2017-09-03 DIAGNOSIS — Z86010 Personal history of colonic polyps: Secondary | ICD-10-CM | POA: Diagnosis not present

## 2017-09-03 DIAGNOSIS — K5730 Diverticulosis of large intestine without perforation or abscess without bleeding: Secondary | ICD-10-CM | POA: Diagnosis not present

## 2017-09-03 DIAGNOSIS — Z8379 Family history of other diseases of the digestive system: Secondary | ICD-10-CM | POA: Diagnosis not present

## 2017-09-03 DIAGNOSIS — K635 Polyp of colon: Secondary | ICD-10-CM | POA: Diagnosis not present

## 2017-09-03 DIAGNOSIS — D125 Benign neoplasm of sigmoid colon: Secondary | ICD-10-CM | POA: Diagnosis not present

## 2017-09-03 DIAGNOSIS — K2960 Other gastritis without bleeding: Secondary | ICD-10-CM | POA: Diagnosis not present

## 2017-09-03 DIAGNOSIS — K219 Gastro-esophageal reflux disease without esophagitis: Secondary | ICD-10-CM | POA: Diagnosis not present

## 2017-10-08 ENCOUNTER — Other Ambulatory Visit: Payer: Self-pay | Admitting: Physical Medicine & Rehabilitation

## 2017-10-22 DIAGNOSIS — Z125 Encounter for screening for malignant neoplasm of prostate: Secondary | ICD-10-CM | POA: Diagnosis not present

## 2017-10-22 DIAGNOSIS — I10 Essential (primary) hypertension: Secondary | ICD-10-CM | POA: Diagnosis not present

## 2017-10-22 DIAGNOSIS — E039 Hypothyroidism, unspecified: Secondary | ICD-10-CM | POA: Diagnosis not present

## 2017-10-22 DIAGNOSIS — I5030 Unspecified diastolic (congestive) heart failure: Secondary | ICD-10-CM | POA: Diagnosis not present

## 2017-10-22 DIAGNOSIS — E118 Type 2 diabetes mellitus with unspecified complications: Secondary | ICD-10-CM | POA: Diagnosis not present

## 2017-10-22 DIAGNOSIS — E782 Mixed hyperlipidemia: Secondary | ICD-10-CM | POA: Diagnosis not present

## 2017-10-26 DIAGNOSIS — Z961 Presence of intraocular lens: Secondary | ICD-10-CM | POA: Diagnosis not present

## 2017-10-26 DIAGNOSIS — H2511 Age-related nuclear cataract, right eye: Secondary | ICD-10-CM | POA: Diagnosis not present

## 2017-10-26 DIAGNOSIS — H401134 Primary open-angle glaucoma, bilateral, indeterminate stage: Secondary | ICD-10-CM | POA: Diagnosis not present

## 2017-10-29 ENCOUNTER — Encounter: Payer: Medicare Other | Admitting: Physical Medicine & Rehabilitation

## 2017-11-10 DIAGNOSIS — Z0184 Encounter for antibody response examination: Secondary | ICD-10-CM | POA: Diagnosis not present

## 2017-11-10 DIAGNOSIS — Z23 Encounter for immunization: Secondary | ICD-10-CM | POA: Diagnosis not present

## 2017-11-18 DIAGNOSIS — E119 Type 2 diabetes mellitus without complications: Secondary | ICD-10-CM | POA: Diagnosis not present

## 2017-11-18 DIAGNOSIS — L03032 Cellulitis of left toe: Secondary | ICD-10-CM | POA: Diagnosis not present

## 2017-11-18 DIAGNOSIS — L600 Ingrowing nail: Secondary | ICD-10-CM | POA: Diagnosis not present

## 2017-12-02 DIAGNOSIS — L600 Ingrowing nail: Secondary | ICD-10-CM | POA: Diagnosis not present

## 2017-12-03 ENCOUNTER — Encounter: Payer: Medicare Other | Admitting: Physical Medicine & Rehabilitation

## 2017-12-09 DIAGNOSIS — Z23 Encounter for immunization: Secondary | ICD-10-CM | POA: Diagnosis not present

## 2018-01-14 ENCOUNTER — Other Ambulatory Visit: Payer: Self-pay

## 2018-01-14 ENCOUNTER — Encounter: Payer: Self-pay | Admitting: Physical Medicine & Rehabilitation

## 2018-01-14 ENCOUNTER — Encounter: Payer: Medicare Other | Attending: Physical Medicine & Rehabilitation | Admitting: Physical Medicine & Rehabilitation

## 2018-01-14 VITALS — BP 138/90 | HR 88 | Ht 69.5 in | Wt 265.0 lb

## 2018-01-14 DIAGNOSIS — I2510 Atherosclerotic heart disease of native coronary artery without angina pectoris: Secondary | ICD-10-CM | POA: Insufficient documentation

## 2018-01-14 DIAGNOSIS — Z961 Presence of intraocular lens: Secondary | ICD-10-CM | POA: Insufficient documentation

## 2018-01-14 DIAGNOSIS — G8929 Other chronic pain: Secondary | ICD-10-CM | POA: Diagnosis not present

## 2018-01-14 DIAGNOSIS — E039 Hypothyroidism, unspecified: Secondary | ICD-10-CM | POA: Diagnosis not present

## 2018-01-14 DIAGNOSIS — I509 Heart failure, unspecified: Secondary | ICD-10-CM | POA: Diagnosis not present

## 2018-01-14 DIAGNOSIS — Z9842 Cataract extraction status, left eye: Secondary | ICD-10-CM | POA: Insufficient documentation

## 2018-01-14 DIAGNOSIS — M4306 Spondylolysis, lumbar region: Secondary | ICD-10-CM

## 2018-01-14 DIAGNOSIS — F329 Major depressive disorder, single episode, unspecified: Secondary | ICD-10-CM | POA: Insufficient documentation

## 2018-01-14 DIAGNOSIS — I110 Hypertensive heart disease with heart failure: Secondary | ICD-10-CM | POA: Diagnosis not present

## 2018-01-14 DIAGNOSIS — Z9049 Acquired absence of other specified parts of digestive tract: Secondary | ICD-10-CM | POA: Insufficient documentation

## 2018-01-14 DIAGNOSIS — Z882 Allergy status to sulfonamides status: Secondary | ICD-10-CM | POA: Diagnosis not present

## 2018-01-14 DIAGNOSIS — M5137 Other intervertebral disc degeneration, lumbosacral region: Secondary | ICD-10-CM | POA: Diagnosis not present

## 2018-01-14 DIAGNOSIS — G894 Chronic pain syndrome: Secondary | ICD-10-CM | POA: Diagnosis not present

## 2018-01-14 DIAGNOSIS — M47816 Spondylosis without myelopathy or radiculopathy, lumbar region: Secondary | ICD-10-CM | POA: Diagnosis not present

## 2018-01-14 DIAGNOSIS — M62830 Muscle spasm of back: Secondary | ICD-10-CM | POA: Diagnosis not present

## 2018-01-14 DIAGNOSIS — Z8249 Family history of ischemic heart disease and other diseases of the circulatory system: Secondary | ICD-10-CM | POA: Insufficient documentation

## 2018-01-14 DIAGNOSIS — Z6838 Body mass index (BMI) 38.0-38.9, adult: Secondary | ICD-10-CM | POA: Diagnosis not present

## 2018-01-14 DIAGNOSIS — M545 Low back pain, unspecified: Secondary | ICD-10-CM

## 2018-01-14 DIAGNOSIS — M549 Dorsalgia, unspecified: Secondary | ICD-10-CM | POA: Diagnosis present

## 2018-01-14 DIAGNOSIS — K219 Gastro-esophageal reflux disease without esophagitis: Secondary | ICD-10-CM | POA: Diagnosis not present

## 2018-01-14 DIAGNOSIS — G4733 Obstructive sleep apnea (adult) (pediatric): Secondary | ICD-10-CM | POA: Insufficient documentation

## 2018-01-14 DIAGNOSIS — E119 Type 2 diabetes mellitus without complications: Secondary | ICD-10-CM | POA: Insufficient documentation

## 2018-01-14 DIAGNOSIS — E7800 Pure hypercholesterolemia, unspecified: Secondary | ICD-10-CM | POA: Diagnosis not present

## 2018-01-14 DIAGNOSIS — Z87891 Personal history of nicotine dependence: Secondary | ICD-10-CM | POA: Diagnosis not present
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
## 2018-01-27 DIAGNOSIS — E1140 Type 2 diabetes mellitus with diabetic neuropathy, unspecified: Secondary | ICD-10-CM | POA: Diagnosis not present

## 2018-01-27 DIAGNOSIS — E1129 Type 2 diabetes mellitus with other diabetic kidney complication: Secondary | ICD-10-CM | POA: Diagnosis not present

## 2018-01-27 DIAGNOSIS — E1165 Type 2 diabetes mellitus with hyperglycemia: Secondary | ICD-10-CM | POA: Diagnosis not present

## 2018-01-27 DIAGNOSIS — E118 Type 2 diabetes mellitus with unspecified complications: Secondary | ICD-10-CM | POA: Diagnosis not present

## 2018-03-26 DIAGNOSIS — H401123 Primary open-angle glaucoma, left eye, severe stage: Secondary | ICD-10-CM | POA: Diagnosis not present

## 2018-03-26 DIAGNOSIS — H401114 Primary open-angle glaucoma, right eye, indeterminate stage: Secondary | ICD-10-CM | POA: Diagnosis not present

## 2018-04-13 DIAGNOSIS — I889 Nonspecific lymphadenitis, unspecified: Secondary | ICD-10-CM | POA: Diagnosis not present

## 2018-04-13 DIAGNOSIS — Z6839 Body mass index (BMI) 39.0-39.9, adult: Secondary | ICD-10-CM | POA: Diagnosis not present

## 2018-04-14 DIAGNOSIS — R221 Localized swelling, mass and lump, neck: Secondary | ICD-10-CM | POA: Diagnosis not present

## 2018-04-14 DIAGNOSIS — Z6838 Body mass index (BMI) 38.0-38.9, adult: Secondary | ICD-10-CM | POA: Diagnosis not present

## 2018-04-16 DIAGNOSIS — R221 Localized swelling, mass and lump, neck: Secondary | ICD-10-CM | POA: Diagnosis not present

## 2018-04-16 DIAGNOSIS — K115 Sialolithiasis: Secondary | ICD-10-CM | POA: Diagnosis not present

## 2018-04-19 DIAGNOSIS — K1120 Sialoadenitis, unspecified: Secondary | ICD-10-CM | POA: Diagnosis not present

## 2018-04-19 DIAGNOSIS — K115 Sialolithiasis: Secondary | ICD-10-CM | POA: Diagnosis not present

## 2018-04-29 DIAGNOSIS — E118 Type 2 diabetes mellitus with unspecified complications: Secondary | ICD-10-CM | POA: Diagnosis not present

## 2018-04-29 DIAGNOSIS — E039 Hypothyroidism, unspecified: Secondary | ICD-10-CM | POA: Diagnosis not present

## 2018-04-29 DIAGNOSIS — I5030 Unspecified diastolic (congestive) heart failure: Secondary | ICD-10-CM | POA: Diagnosis not present

## 2018-04-29 DIAGNOSIS — E782 Mixed hyperlipidemia: Secondary | ICD-10-CM | POA: Diagnosis not present

## 2018-04-29 DIAGNOSIS — I10 Essential (primary) hypertension: Secondary | ICD-10-CM | POA: Diagnosis not present

## 2018-05-06 ENCOUNTER — Other Ambulatory Visit: Payer: Self-pay | Admitting: Physical Medicine & Rehabilitation

## 2018-05-17 DIAGNOSIS — Z9889 Other specified postprocedural states: Secondary | ICD-10-CM | POA: Diagnosis not present

## 2018-05-17 DIAGNOSIS — H2513 Age-related nuclear cataract, bilateral: Secondary | ICD-10-CM | POA: Diagnosis not present

## 2018-05-17 DIAGNOSIS — E119 Type 2 diabetes mellitus without complications: Secondary | ICD-10-CM | POA: Diagnosis not present

## 2018-05-17 DIAGNOSIS — H401134 Primary open-angle glaucoma, bilateral, indeterminate stage: Secondary | ICD-10-CM | POA: Diagnosis not present

## 2018-05-17 DIAGNOSIS — Z961 Presence of intraocular lens: Secondary | ICD-10-CM | POA: Diagnosis not present

## 2018-05-17 DIAGNOSIS — H53002 Unspecified amblyopia, left eye: Secondary | ICD-10-CM | POA: Diagnosis not present

## 2018-06-10 DIAGNOSIS — G4733 Obstructive sleep apnea (adult) (pediatric): Secondary | ICD-10-CM | POA: Diagnosis not present

## 2018-06-10 DIAGNOSIS — R590 Localized enlarged lymph nodes: Secondary | ICD-10-CM | POA: Diagnosis not present

## 2018-06-10 DIAGNOSIS — I2510 Atherosclerotic heart disease of native coronary artery without angina pectoris: Secondary | ICD-10-CM | POA: Diagnosis not present

## 2018-06-10 DIAGNOSIS — Z6838 Body mass index (BMI) 38.0-38.9, adult: Secondary | ICD-10-CM | POA: Diagnosis not present

## 2018-06-10 DIAGNOSIS — E669 Obesity, unspecified: Secondary | ICD-10-CM | POA: Diagnosis not present

## 2018-06-10 DIAGNOSIS — Z87891 Personal history of nicotine dependence: Secondary | ICD-10-CM | POA: Diagnosis not present

## 2018-06-10 DIAGNOSIS — R911 Solitary pulmonary nodule: Secondary | ICD-10-CM | POA: Diagnosis not present

## 2018-06-10 DIAGNOSIS — Z6835 Body mass index (BMI) 35.0-35.9, adult: Secondary | ICD-10-CM | POA: Diagnosis not present

## 2018-06-10 DIAGNOSIS — K228 Other specified diseases of esophagus: Secondary | ICD-10-CM | POA: Diagnosis not present

## 2018-06-10 DIAGNOSIS — Z9989 Dependence on other enabling machines and devices: Secondary | ICD-10-CM | POA: Diagnosis not present

## 2018-06-13 DIAGNOSIS — J209 Acute bronchitis, unspecified: Secondary | ICD-10-CM | POA: Diagnosis not present

## 2018-06-13 DIAGNOSIS — R05 Cough: Secondary | ICD-10-CM | POA: Diagnosis not present

## 2018-06-21 DIAGNOSIS — G4733 Obstructive sleep apnea (adult) (pediatric): Secondary | ICD-10-CM | POA: Diagnosis not present

## 2018-06-22 DIAGNOSIS — N529 Male erectile dysfunction, unspecified: Secondary | ICD-10-CM | POA: Diagnosis not present

## 2018-06-22 DIAGNOSIS — R05 Cough: Secondary | ICD-10-CM | POA: Diagnosis not present

## 2018-06-22 DIAGNOSIS — H8309 Labyrinthitis, unspecified ear: Secondary | ICD-10-CM | POA: Diagnosis not present

## 2018-06-22 DIAGNOSIS — Z6838 Body mass index (BMI) 38.0-38.9, adult: Secondary | ICD-10-CM | POA: Diagnosis not present

## 2018-07-02 ENCOUNTER — Other Ambulatory Visit: Payer: Self-pay | Admitting: Physical Medicine & Rehabilitation

## 2018-07-15 ENCOUNTER — Encounter: Payer: Medicare Other | Attending: Physical Medicine & Rehabilitation | Admitting: Physical Medicine & Rehabilitation

## 2018-07-15 DIAGNOSIS — G8929 Other chronic pain: Secondary | ICD-10-CM | POA: Insufficient documentation

## 2018-07-15 DIAGNOSIS — M545 Low back pain: Secondary | ICD-10-CM | POA: Insufficient documentation

## 2018-07-15 DIAGNOSIS — M47816 Spondylosis without myelopathy or radiculopathy, lumbar region: Secondary | ICD-10-CM | POA: Insufficient documentation

## 2018-07-15 DIAGNOSIS — K219 Gastro-esophageal reflux disease without esophagitis: Secondary | ICD-10-CM | POA: Insufficient documentation

## 2018-07-15 DIAGNOSIS — Z961 Presence of intraocular lens: Secondary | ICD-10-CM | POA: Insufficient documentation

## 2018-07-15 DIAGNOSIS — Z9842 Cataract extraction status, left eye: Secondary | ICD-10-CM | POA: Insufficient documentation

## 2018-07-15 DIAGNOSIS — E039 Hypothyroidism, unspecified: Secondary | ICD-10-CM | POA: Insufficient documentation

## 2018-07-15 DIAGNOSIS — E119 Type 2 diabetes mellitus without complications: Secondary | ICD-10-CM | POA: Insufficient documentation

## 2018-07-15 DIAGNOSIS — E7800 Pure hypercholesterolemia, unspecified: Secondary | ICD-10-CM | POA: Insufficient documentation

## 2018-07-15 DIAGNOSIS — Z87891 Personal history of nicotine dependence: Secondary | ICD-10-CM | POA: Insufficient documentation

## 2018-07-15 DIAGNOSIS — G4733 Obstructive sleep apnea (adult) (pediatric): Secondary | ICD-10-CM | POA: Insufficient documentation

## 2018-07-15 DIAGNOSIS — I2510 Atherosclerotic heart disease of native coronary artery without angina pectoris: Secondary | ICD-10-CM | POA: Insufficient documentation

## 2018-07-15 DIAGNOSIS — Z6838 Body mass index (BMI) 38.0-38.9, adult: Secondary | ICD-10-CM | POA: Insufficient documentation

## 2018-07-15 DIAGNOSIS — I509 Heart failure, unspecified: Secondary | ICD-10-CM | POA: Insufficient documentation

## 2018-07-15 DIAGNOSIS — Z9049 Acquired absence of other specified parts of digestive tract: Secondary | ICD-10-CM | POA: Insufficient documentation

## 2018-07-15 DIAGNOSIS — F329 Major depressive disorder, single episode, unspecified: Secondary | ICD-10-CM | POA: Insufficient documentation

## 2018-07-15 DIAGNOSIS — M5137 Other intervertebral disc degeneration, lumbosacral region: Secondary | ICD-10-CM | POA: Insufficient documentation

## 2018-07-15 DIAGNOSIS — Z8249 Family history of ischemic heart disease and other diseases of the circulatory system: Secondary | ICD-10-CM | POA: Insufficient documentation

## 2018-07-15 DIAGNOSIS — Z882 Allergy status to sulfonamides status: Secondary | ICD-10-CM | POA: Insufficient documentation

## 2018-07-15 DIAGNOSIS — I110 Hypertensive heart disease with heart failure: Secondary | ICD-10-CM | POA: Insufficient documentation

## 2018-07-20 DIAGNOSIS — E039 Hypothyroidism, unspecified: Secondary | ICD-10-CM | POA: Diagnosis not present

## 2018-07-20 DIAGNOSIS — R000 Tachycardia, unspecified: Secondary | ICD-10-CM | POA: Diagnosis not present

## 2018-07-20 DIAGNOSIS — E118 Type 2 diabetes mellitus with unspecified complications: Secondary | ICD-10-CM | POA: Diagnosis not present

## 2018-07-20 DIAGNOSIS — G4733 Obstructive sleep apnea (adult) (pediatric): Secondary | ICD-10-CM | POA: Diagnosis not present

## 2018-07-20 DIAGNOSIS — R5383 Other fatigue: Secondary | ICD-10-CM | POA: Diagnosis not present

## 2018-07-29 DIAGNOSIS — K115 Sialolithiasis: Secondary | ICD-10-CM | POA: Diagnosis not present

## 2018-07-29 DIAGNOSIS — E118 Type 2 diabetes mellitus with unspecified complications: Secondary | ICD-10-CM | POA: Diagnosis not present

## 2018-07-29 DIAGNOSIS — R5383 Other fatigue: Secondary | ICD-10-CM | POA: Diagnosis not present

## 2018-07-29 DIAGNOSIS — I5030 Unspecified diastolic (congestive) heart failure: Secondary | ICD-10-CM | POA: Diagnosis not present

## 2018-07-30 DIAGNOSIS — K1123 Chronic sialoadenitis: Secondary | ICD-10-CM | POA: Diagnosis not present

## 2018-08-05 DIAGNOSIS — Z0000 Encounter for general adult medical examination without abnormal findings: Secondary | ICD-10-CM | POA: Diagnosis not present

## 2018-08-05 DIAGNOSIS — E785 Hyperlipidemia, unspecified: Secondary | ICD-10-CM | POA: Diagnosis not present

## 2018-08-05 DIAGNOSIS — E669 Obesity, unspecified: Secondary | ICD-10-CM | POA: Diagnosis not present

## 2018-08-05 DIAGNOSIS — Z125 Encounter for screening for malignant neoplasm of prostate: Secondary | ICD-10-CM | POA: Diagnosis not present

## 2018-08-23 ENCOUNTER — Encounter: Payer: Self-pay | Admitting: Physical Medicine & Rehabilitation

## 2018-08-23 ENCOUNTER — Encounter: Payer: Medicare Other | Attending: Physical Medicine & Rehabilitation | Admitting: Physical Medicine & Rehabilitation

## 2018-08-23 ENCOUNTER — Other Ambulatory Visit: Payer: Self-pay

## 2018-08-23 DIAGNOSIS — M5137 Other intervertebral disc degeneration, lumbosacral region: Secondary | ICD-10-CM | POA: Insufficient documentation

## 2018-08-23 DIAGNOSIS — I2510 Atherosclerotic heart disease of native coronary artery without angina pectoris: Secondary | ICD-10-CM | POA: Insufficient documentation

## 2018-08-23 DIAGNOSIS — Z961 Presence of intraocular lens: Secondary | ICD-10-CM | POA: Insufficient documentation

## 2018-08-23 DIAGNOSIS — Z9842 Cataract extraction status, left eye: Secondary | ICD-10-CM | POA: Insufficient documentation

## 2018-08-23 DIAGNOSIS — M62830 Muscle spasm of back: Secondary | ICD-10-CM

## 2018-08-23 DIAGNOSIS — G8929 Other chronic pain: Secondary | ICD-10-CM

## 2018-08-23 DIAGNOSIS — G4733 Obstructive sleep apnea (adult) (pediatric): Secondary | ICD-10-CM | POA: Insufficient documentation

## 2018-08-23 DIAGNOSIS — E039 Hypothyroidism, unspecified: Secondary | ICD-10-CM | POA: Insufficient documentation

## 2018-08-23 DIAGNOSIS — Z6838 Body mass index (BMI) 38.0-38.9, adult: Secondary | ICD-10-CM | POA: Insufficient documentation

## 2018-08-23 DIAGNOSIS — E7800 Pure hypercholesterolemia, unspecified: Secondary | ICD-10-CM | POA: Insufficient documentation

## 2018-08-23 DIAGNOSIS — Z9049 Acquired absence of other specified parts of digestive tract: Secondary | ICD-10-CM | POA: Insufficient documentation

## 2018-08-23 DIAGNOSIS — I110 Hypertensive heart disease with heart failure: Secondary | ICD-10-CM | POA: Insufficient documentation

## 2018-08-23 DIAGNOSIS — M545 Low back pain, unspecified: Secondary | ICD-10-CM

## 2018-08-23 DIAGNOSIS — E119 Type 2 diabetes mellitus without complications: Secondary | ICD-10-CM | POA: Insufficient documentation

## 2018-08-23 DIAGNOSIS — I509 Heart failure, unspecified: Secondary | ICD-10-CM | POA: Insufficient documentation

## 2018-08-23 DIAGNOSIS — Z87891 Personal history of nicotine dependence: Secondary | ICD-10-CM | POA: Insufficient documentation

## 2018-08-23 DIAGNOSIS — Z882 Allergy status to sulfonamides status: Secondary | ICD-10-CM | POA: Insufficient documentation

## 2018-08-23 DIAGNOSIS — G894 Chronic pain syndrome: Secondary | ICD-10-CM

## 2018-08-23 DIAGNOSIS — F329 Major depressive disorder, single episode, unspecified: Secondary | ICD-10-CM | POA: Insufficient documentation

## 2018-08-23 DIAGNOSIS — M4306 Spondylolysis, lumbar region: Secondary | ICD-10-CM

## 2018-08-23 DIAGNOSIS — Z8249 Family history of ischemic heart disease and other diseases of the circulatory system: Secondary | ICD-10-CM | POA: Insufficient documentation

## 2018-08-23 DIAGNOSIS — K219 Gastro-esophageal reflux disease without esophagitis: Secondary | ICD-10-CM | POA: Insufficient documentation

## 2018-08-23 DIAGNOSIS — M47816 Spondylosis without myelopathy or radiculopathy, lumbar region: Secondary | ICD-10-CM | POA: Insufficient documentation
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
## 2018-08-24 DIAGNOSIS — L03115 Cellulitis of right lower limb: Secondary | ICD-10-CM | POA: Diagnosis not present

## 2018-08-24 DIAGNOSIS — Z6838 Body mass index (BMI) 38.0-38.9, adult: Secondary | ICD-10-CM | POA: Diagnosis not present

## 2018-08-30 DIAGNOSIS — K1123 Chronic sialoadenitis: Secondary | ICD-10-CM | POA: Diagnosis not present

## 2018-08-31 DIAGNOSIS — H6123 Impacted cerumen, bilateral: Secondary | ICD-10-CM | POA: Diagnosis not present

## 2018-10-05 DIAGNOSIS — K1122 Acute recurrent sialoadenitis: Secondary | ICD-10-CM | POA: Diagnosis not present

## 2018-10-12 DIAGNOSIS — M778 Other enthesopathies, not elsewhere classified: Secondary | ICD-10-CM | POA: Diagnosis not present

## 2018-10-12 DIAGNOSIS — Z6838 Body mass index (BMI) 38.0-38.9, adult: Secondary | ICD-10-CM | POA: Diagnosis not present

## 2018-10-18 DIAGNOSIS — H401134 Primary open-angle glaucoma, bilateral, indeterminate stage: Secondary | ICD-10-CM | POA: Diagnosis not present

## 2018-10-25 DIAGNOSIS — R42 Dizziness and giddiness: Secondary | ICD-10-CM | POA: Diagnosis not present

## 2018-10-25 DIAGNOSIS — R000 Tachycardia, unspecified: Secondary | ICD-10-CM | POA: Diagnosis not present

## 2018-10-25 DIAGNOSIS — R5383 Other fatigue: Secondary | ICD-10-CM | POA: Diagnosis not present

## 2018-10-25 DIAGNOSIS — E118 Type 2 diabetes mellitus with unspecified complications: Secondary | ICD-10-CM | POA: Diagnosis not present

## 2018-11-11 DIAGNOSIS — E118 Type 2 diabetes mellitus with unspecified complications: Secondary | ICD-10-CM | POA: Diagnosis not present

## 2018-11-11 DIAGNOSIS — R000 Tachycardia, unspecified: Secondary | ICD-10-CM | POA: Diagnosis not present

## 2018-11-11 DIAGNOSIS — R5383 Other fatigue: Secondary | ICD-10-CM | POA: Diagnosis not present

## 2018-11-11 DIAGNOSIS — R42 Dizziness and giddiness: Secondary | ICD-10-CM | POA: Diagnosis not present

## 2018-11-11 DIAGNOSIS — Z139 Encounter for screening, unspecified: Secondary | ICD-10-CM | POA: Diagnosis not present

## 2018-11-15 DIAGNOSIS — R748 Abnormal levels of other serum enzymes: Secondary | ICD-10-CM | POA: Diagnosis not present

## 2018-12-15 DIAGNOSIS — Z7982 Long term (current) use of aspirin: Secondary | ICD-10-CM | POA: Diagnosis not present

## 2018-12-15 DIAGNOSIS — R079 Chest pain, unspecified: Secondary | ICD-10-CM | POA: Diagnosis not present

## 2018-12-15 DIAGNOSIS — Z20828 Contact with and (suspected) exposure to other viral communicable diseases: Secondary | ICD-10-CM | POA: Diagnosis not present

## 2018-12-15 DIAGNOSIS — Z9049 Acquired absence of other specified parts of digestive tract: Secondary | ICD-10-CM | POA: Diagnosis not present

## 2018-12-15 DIAGNOSIS — E119 Type 2 diabetes mellitus without complications: Secondary | ICD-10-CM | POA: Diagnosis not present

## 2018-12-15 DIAGNOSIS — Z794 Long term (current) use of insulin: Secondary | ICD-10-CM | POA: Diagnosis not present

## 2018-12-15 DIAGNOSIS — M1990 Unspecified osteoarthritis, unspecified site: Secondary | ICD-10-CM | POA: Diagnosis not present

## 2018-12-15 DIAGNOSIS — R0781 Pleurodynia: Secondary | ICD-10-CM | POA: Diagnosis not present

## 2018-12-15 DIAGNOSIS — I509 Heart failure, unspecified: Secondary | ICD-10-CM | POA: Diagnosis not present

## 2018-12-15 DIAGNOSIS — E7800 Pure hypercholesterolemia, unspecified: Secondary | ICD-10-CM | POA: Diagnosis not present

## 2018-12-15 DIAGNOSIS — I2510 Atherosclerotic heart disease of native coronary artery without angina pectoris: Secondary | ICD-10-CM | POA: Diagnosis not present

## 2018-12-15 DIAGNOSIS — Z87891 Personal history of nicotine dependence: Secondary | ICD-10-CM | POA: Diagnosis not present

## 2018-12-15 DIAGNOSIS — E079 Disorder of thyroid, unspecified: Secondary | ICD-10-CM | POA: Diagnosis not present

## 2018-12-15 DIAGNOSIS — I110 Hypertensive heart disease with heart failure: Secondary | ICD-10-CM | POA: Diagnosis not present

## 2018-12-15 DIAGNOSIS — Z79899 Other long term (current) drug therapy: Secondary | ICD-10-CM | POA: Diagnosis not present

## 2018-12-16 ENCOUNTER — Encounter (HOSPITAL_COMMUNITY): Payer: Self-pay | Admitting: *Deleted

## 2018-12-16 ENCOUNTER — Inpatient Hospital Stay (HOSPITAL_COMMUNITY)
Admission: EM | Admit: 2018-12-16 | Discharge: 2018-12-20 | DRG: 286 | Disposition: A | Discharge status: Home / Self Care | Payer: Medicare Other | Attending: Cardiology | Admitting: Cardiology

## 2018-12-16 ENCOUNTER — Emergency Department (HOSPITAL_COMMUNITY): Payer: Medicare Other

## 2018-12-16 DIAGNOSIS — I509 Heart failure, unspecified: Secondary | ICD-10-CM | POA: Diagnosis not present

## 2018-12-16 DIAGNOSIS — I4891 Unspecified atrial fibrillation: Secondary | ICD-10-CM | POA: Diagnosis not present

## 2018-12-16 DIAGNOSIS — E1165 Type 2 diabetes mellitus with hyperglycemia: Secondary | ICD-10-CM | POA: Diagnosis present

## 2018-12-16 DIAGNOSIS — K219 Gastro-esophageal reflux disease without esophagitis: Secondary | ICD-10-CM | POA: Diagnosis present

## 2018-12-16 DIAGNOSIS — I480 Paroxysmal atrial fibrillation: Secondary | ICD-10-CM | POA: Diagnosis not present

## 2018-12-16 DIAGNOSIS — Z79899 Other long term (current) drug therapy: Secondary | ICD-10-CM

## 2018-12-16 DIAGNOSIS — E785 Hyperlipidemia, unspecified: Secondary | ICD-10-CM | POA: Diagnosis present

## 2018-12-16 DIAGNOSIS — I2510 Atherosclerotic heart disease of native coronary artery without angina pectoris: Secondary | ICD-10-CM | POA: Diagnosis present

## 2018-12-16 DIAGNOSIS — Z20828 Contact with and (suspected) exposure to other viral communicable diseases: Secondary | ICD-10-CM | POA: Diagnosis not present

## 2018-12-16 DIAGNOSIS — I440 Atrioventricular block, first degree: Secondary | ICD-10-CM | POA: Diagnosis present

## 2018-12-16 DIAGNOSIS — E039 Hypothyroidism, unspecified: Secondary | ICD-10-CM | POA: Diagnosis present

## 2018-12-16 DIAGNOSIS — Z79890 Hormone replacement therapy (postmenopausal): Secondary | ICD-10-CM

## 2018-12-16 DIAGNOSIS — F329 Major depressive disorder, single episode, unspecified: Secondary | ICD-10-CM | POA: Diagnosis present

## 2018-12-16 DIAGNOSIS — Z791 Long term (current) use of non-steroidal anti-inflammatories (NSAID): Secondary | ICD-10-CM

## 2018-12-16 DIAGNOSIS — R0781 Pleurodynia: Secondary | ICD-10-CM | POA: Diagnosis not present

## 2018-12-16 DIAGNOSIS — Z7982 Long term (current) use of aspirin: Secondary | ICD-10-CM

## 2018-12-16 DIAGNOSIS — G4733 Obstructive sleep apnea (adult) (pediatric): Secondary | ICD-10-CM | POA: Diagnosis present

## 2018-12-16 DIAGNOSIS — I5033 Acute on chronic diastolic (congestive) heart failure: Secondary | ICD-10-CM | POA: Diagnosis not present

## 2018-12-16 DIAGNOSIS — Z9049 Acquired absence of other specified parts of digestive tract: Secondary | ICD-10-CM

## 2018-12-16 DIAGNOSIS — I420 Dilated cardiomyopathy: Secondary | ICD-10-CM | POA: Diagnosis present

## 2018-12-16 DIAGNOSIS — Z87891 Personal history of nicotine dependence: Secondary | ICD-10-CM

## 2018-12-16 DIAGNOSIS — I110 Hypertensive heart disease with heart failure: Secondary | ICD-10-CM | POA: Diagnosis present

## 2018-12-16 DIAGNOSIS — Z6836 Body mass index (BMI) 36.0-36.9, adult: Secondary | ICD-10-CM

## 2018-12-16 DIAGNOSIS — R000 Tachycardia, unspecified: Secondary | ICD-10-CM | POA: Diagnosis not present

## 2018-12-16 DIAGNOSIS — I10 Essential (primary) hypertension: Secondary | ICD-10-CM | POA: Diagnosis not present

## 2018-12-16 DIAGNOSIS — R079 Chest pain, unspecified: Secondary | ICD-10-CM | POA: Diagnosis not present

## 2018-12-16 LAB — CBC
HCT: 40.4 % (ref 39.0–52.0)
Hemoglobin: 14.1 g/dL (ref 13.0–17.0)
MCH: 29.9 pg (ref 26.0–34.0)
MCHC: 34.9 g/dL (ref 30.0–36.0)
MCV: 85.6 fL (ref 80.0–100.0)
Platelets: 194 10*3/uL (ref 150–400)
RBC: 4.72 MIL/uL (ref 4.22–5.81)
RDW: 13.6 % (ref 11.5–15.5)
WBC: 8.2 10*3/uL (ref 4.0–10.5)
nRBC: 0 % (ref 0.0–0.2)

## 2018-12-16 LAB — BASIC METABOLIC PANEL
Anion gap: 12 (ref 5–15)
BUN: 10 mg/dL (ref 8–23)
CO2: 27 mmol/L (ref 22–32)
Calcium: 9.9 mg/dL (ref 8.9–10.3)
Chloride: 96 mmol/L — ABNORMAL LOW (ref 98–111)
Creatinine, Ser: 0.97 mg/dL (ref 0.61–1.24)
GFR calc Af Amer: >60 mL/min (ref >60)
GFR calc non Af Amer: >60 mL/min (ref >60)
Glucose, Bld: 333 mg/dL — ABNORMAL HIGH (ref 70–99)
Potassium: 3.6 mmol/L (ref 3.5–5.1)
Sodium: 135 mmol/L (ref 135–145)

## 2018-12-16 LAB — TROPONIN I (HIGH SENSITIVITY): Troponin I (High Sensitivity): 32 ng/L — ABNORMAL HIGH

## 2018-12-16 MED ORDER — SODIUM CHLORIDE 0.9% FLUSH
3.0000 mL | Freq: Once | INTRAVENOUS | Status: AC
  Administered 2018-12-17: 3 mL via INTRAVENOUS

## 2018-12-16 MED ORDER — NITROGLYCERIN 0.4 MG SL SUBL
.40 | SUBLINGUAL_TABLET | SUBLINGUAL | Status: DC
Start: ? — End: 2018-12-16

## 2018-12-16 MED ORDER — FUROSEMIDE 40 MG PO TABS: 40.0000 mg | ORAL_TABLET | Freq: Every day | ORAL | 0 refills | Status: DC

## 2018-12-16 NOTE — ED Triage Notes (Signed)
To ED via [facility], from home, for eval of cp with deep breath. Pt was seen at [facility] yesterday for same and dx with chf. Pt left this am after getting lasix. Took dose at home today but not feeling better.

## 2018-12-17 ENCOUNTER — Other Ambulatory Visit: Payer: Self-pay

## 2018-12-17 DIAGNOSIS — K219 Gastro-esophageal reflux disease without esophagitis: Secondary | ICD-10-CM | POA: Diagnosis not present

## 2018-12-17 DIAGNOSIS — I10 Essential (primary) hypertension: Secondary | ICD-10-CM | POA: Diagnosis not present

## 2018-12-17 DIAGNOSIS — I480 Paroxysmal atrial fibrillation: Secondary | ICD-10-CM | POA: Diagnosis present

## 2018-12-17 DIAGNOSIS — Z79899 Other long term (current) drug therapy: Secondary | ICD-10-CM | POA: Diagnosis not present

## 2018-12-17 DIAGNOSIS — E785 Hyperlipidemia, unspecified: Secondary | ICD-10-CM | POA: Diagnosis present

## 2018-12-17 DIAGNOSIS — E1165 Type 2 diabetes mellitus with hyperglycemia: Secondary | ICD-10-CM | POA: Diagnosis not present

## 2018-12-17 DIAGNOSIS — I2510 Atherosclerotic heart disease of native coronary artery without angina pectoris: Secondary | ICD-10-CM | POA: Diagnosis not present

## 2018-12-17 DIAGNOSIS — F329 Major depressive disorder, single episode, unspecified: Secondary | ICD-10-CM | POA: Diagnosis not present

## 2018-12-17 DIAGNOSIS — Z9049 Acquired absence of other specified parts of digestive tract: Secondary | ICD-10-CM | POA: Diagnosis not present

## 2018-12-17 DIAGNOSIS — R0609 Other forms of dyspnea: Secondary | ICD-10-CM | POA: Diagnosis not present

## 2018-12-17 DIAGNOSIS — I5033 Acute on chronic diastolic (congestive) heart failure: Secondary | ICD-10-CM

## 2018-12-17 DIAGNOSIS — I440 Atrioventricular block, first degree: Secondary | ICD-10-CM | POA: Diagnosis not present

## 2018-12-17 DIAGNOSIS — G4733 Obstructive sleep apnea (adult) (pediatric): Secondary | ICD-10-CM | POA: Diagnosis present

## 2018-12-17 DIAGNOSIS — I4891 Unspecified atrial fibrillation: Secondary | ICD-10-CM

## 2018-12-17 DIAGNOSIS — Z6836 Body mass index (BMI) 36.0-36.9, adult: Secondary | ICD-10-CM | POA: Diagnosis not present

## 2018-12-17 DIAGNOSIS — I420 Dilated cardiomyopathy: Secondary | ICD-10-CM | POA: Diagnosis not present

## 2018-12-17 DIAGNOSIS — R079 Chest pain, unspecified: Secondary | ICD-10-CM | POA: Diagnosis not present

## 2018-12-17 DIAGNOSIS — R945 Abnormal results of liver function studies: Secondary | ICD-10-CM | POA: Diagnosis not present

## 2018-12-17 DIAGNOSIS — R0789 Other chest pain: Secondary | ICD-10-CM | POA: Diagnosis not present

## 2018-12-17 DIAGNOSIS — Z87891 Personal history of nicotine dependence: Secondary | ICD-10-CM | POA: Diagnosis not present

## 2018-12-17 DIAGNOSIS — Z79890 Hormone replacement therapy (postmenopausal): Secondary | ICD-10-CM | POA: Diagnosis not present

## 2018-12-17 DIAGNOSIS — Z791 Long term (current) use of non-steroidal anti-inflammatories (NSAID): Secondary | ICD-10-CM | POA: Diagnosis not present

## 2018-12-17 DIAGNOSIS — Z7982 Long term (current) use of aspirin: Secondary | ICD-10-CM | POA: Diagnosis not present

## 2018-12-17 DIAGNOSIS — E039 Hypothyroidism, unspecified: Secondary | ICD-10-CM | POA: Diagnosis not present

## 2018-12-17 DIAGNOSIS — I110 Hypertensive heart disease with heart failure: Secondary | ICD-10-CM | POA: Diagnosis not present

## 2018-12-17 DIAGNOSIS — Z20828 Contact with and (suspected) exposure to other viral communicable diseases: Secondary | ICD-10-CM | POA: Diagnosis not present

## 2018-12-17 LAB — COMPREHENSIVE METABOLIC PANEL
ALT: 27 U/L (ref 0–44)
AST: 24 U/L (ref 15–41)
Albumin: 3.7 g/dL (ref 3.5–5.0)
Alkaline Phosphatase: 112 U/L (ref 38–126)
Anion gap: 12 (ref 5–15)
BUN: 10 mg/dL (ref 8–23)
CO2: 27 mmol/L (ref 22–32)
Calcium: 9.8 mg/dL (ref 8.9–10.3)
Chloride: 97 mmol/L — ABNORMAL LOW (ref 98–111)
Creatinine, Ser: 0.99 mg/dL (ref 0.61–1.24)
GFR calc Af Amer: >60 mL/min (ref >60)
GFR calc non Af Amer: >60 mL/min (ref >60)
Glucose, Bld: 219 mg/dL — ABNORMAL HIGH (ref 70–99)
Potassium: 3.1 mmol/L — ABNORMAL LOW (ref 3.5–5.1)
Sodium: 136 mmol/L (ref 135–145)
Total Bilirubin: 2 mg/dL — ABNORMAL HIGH (ref 0.3–1.2)
Total Protein: 8.2 g/dL — ABNORMAL HIGH (ref 6.5–8.1)

## 2018-12-17 LAB — GLUCOSE, CAPILLARY
Glucose-Capillary: 171 mg/dL — ABNORMAL HIGH (ref 70–99)
Glucose-Capillary: 195 mg/dL — ABNORMAL HIGH (ref 70–99)
Glucose-Capillary: 164 mg/dL — ABNORMAL HIGH (ref 70–99)
Glucose-Capillary: 151 mg/dL — ABNORMAL HIGH (ref 70–99)

## 2018-12-17 LAB — AMYLASE: Amylase: 75 U/L (ref 28–100)

## 2018-12-17 LAB — LIPASE, BLOOD: Lipase: 42 U/L (ref 11–51)

## 2018-12-17 LAB — TSH: TSH: 3.819 u[IU]/mL (ref 0.350–4.500)

## 2018-12-17 LAB — LIPID PANEL
Cholesterol: 142 mg/dL (ref 0–200)
HDL: 59 mg/dL (ref >40)
LDL Cholesterol: 61 mg/dL (ref 0–99)
Total CHOL/HDL Ratio: 2.4 RATIO
Triglycerides: 109 mg/dL (ref <150)
VLDL: 22 mg/dL (ref 0–40)

## 2018-12-17 LAB — HEMOGLOBIN A1C
Hgb A1c MFr Bld: 6.4 % — ABNORMAL HIGH (ref 4.8–5.6)
Mean Plasma Glucose: 136.98 mg/dL

## 2018-12-17 LAB — TROPONIN I (HIGH SENSITIVITY): Troponin I (High Sensitivity): 36 ng/L — ABNORMAL HIGH (ref <18)

## 2018-12-17 LAB — APTT: aPTT: 34 seconds (ref 24–36)

## 2018-12-17 LAB — SARS CORONAVIRUS 2 (TAT 6-24 HRS): SARS Coronavirus 2: NEGATIVE

## 2018-12-17 LAB — BRAIN NATRIURETIC PEPTIDE: B Natriuretic Peptide: 226.9 pg/mL — ABNORMAL HIGH (ref 0.0–100.0)

## 2018-12-17 LAB — HEPARIN LEVEL (UNFRACTIONATED): Heparin Unfractionated: 0.29 IU/mL — ABNORMAL LOW (ref 0.30–0.70)

## 2018-12-17 MED ORDER — DULOXETINE HCL 60 MG PO CPEP
60.0000 mg | ORAL_CAPSULE | Freq: Every day | ORAL | Status: DC
  Administered 2018-12-17 – 2018-12-20 (×4): 60 mg via ORAL
  Filled 2018-12-17 (×4): qty 1

## 2018-12-17 MED ORDER — LINAGLIPTIN 5 MG PO TABS
5.0000 mg | ORAL_TABLET | Freq: Every day | ORAL | Status: DC
  Administered 2018-12-17 – 2018-12-20 (×4): 5 mg via ORAL
  Filled 2018-12-17 (×4): qty 1

## 2018-12-17 MED ORDER — FUROSEMIDE 10 MG/ML IJ SOLN
40.0000 mg | Freq: Once | INTRAMUSCULAR | Status: AC
  Administered 2018-12-17: 40 mg via INTRAVENOUS
  Filled 2018-12-17: qty 4

## 2018-12-17 MED ORDER — FUROSEMIDE 40 MG PO TABS
40.0000 mg | ORAL_TABLET | Freq: Two times a day (BID) | ORAL | Status: DC
  Administered 2018-12-17 – 2018-12-19 (×5): 40 mg via ORAL
  Filled 2018-12-17 (×5): qty 1

## 2018-12-17 MED ORDER — SITAGLIPTIN PHOS-METFORMIN HCL 50-1000 MG PO TABS: 1.0000 | ORAL_TABLET | Freq: Two times a day (BID) | ORAL | Status: DC

## 2018-12-17 MED ORDER — HEPARIN (PORCINE) 25000 UT/250ML-% IV SOLN: 1550.0000 [IU]/h | INTRAVENOUS | Status: DC

## 2018-12-17 MED ORDER — INSULIN GLARGINE 100 UNIT/ML ~~LOC~~ SOLN
75.0000 [IU] | Freq: Two times a day (BID) | SUBCUTANEOUS | Status: DC
  Administered 2018-12-17 – 2018-12-20 (×7): 75 [IU] via SUBCUTANEOUS
  Filled 2018-12-17 (×8): qty 0.75

## 2018-12-17 MED ORDER — LOSARTAN POTASSIUM 50 MG PO TABS
50.0000 mg | ORAL_TABLET | Freq: Every day | ORAL | Status: DC
  Filled 2018-12-17: qty 1

## 2018-12-17 MED ORDER — METFORMIN HCL 500 MG PO TABS
1000.0000 mg | ORAL_TABLET | Freq: Two times a day (BID) | ORAL | Status: DC
  Administered 2018-12-17 – 2018-12-18 (×4): 1000 mg via ORAL
  Filled 2018-12-17 (×4): qty 2

## 2018-12-17 MED ORDER — HEPARIN (PORCINE) 25000 UT/250ML-% IV SOLN
1000.0000 [IU]/h | INTRAVENOUS | Status: DC
  Administered 2018-12-17: 1000 [IU]/h via INTRAVENOUS
  Filled 2018-12-17: qty 250

## 2018-12-17 MED ORDER — METOPROLOL TARTRATE 5 MG/5ML IV SOLN
2.5000 mg | Freq: Once | INTRAVENOUS | Status: AC
  Administered 2018-12-17: 2.5 mg via INTRAVENOUS
  Filled 2018-12-17: qty 5

## 2018-12-17 MED ORDER — ONDANSETRON HCL 4 MG/2ML IJ SOLN: 4.0000 mg | Freq: Four times a day (QID) | INTRAMUSCULAR | Status: DC | PRN

## 2018-12-17 MED ORDER — INSULIN ASPART 100 UNIT/ML ~~LOC~~ SOLN
0.0000 [IU] | Freq: Every day | SUBCUTANEOUS | Status: DC
  Administered 2018-12-19: 2 [IU] via SUBCUTANEOUS

## 2018-12-17 MED ORDER — HEPARIN BOLUS VIA INFUSION
4000.0000 [IU] | Freq: Once | INTRAVENOUS | Status: AC
  Administered 2018-12-17: 4000 [IU] via INTRAVENOUS
  Filled 2018-12-17: qty 4000

## 2018-12-17 MED ORDER — POTASSIUM CHLORIDE CRYS ER 10 MEQ PO TBCR
20.0000 meq | EXTENDED_RELEASE_TABLET | Freq: Every day | ORAL | Status: DC
  Administered 2018-12-17 – 2018-12-20 (×4): 20 meq via ORAL
  Filled 2018-12-17 (×7): qty 2

## 2018-12-17 MED ORDER — INSULIN GLARGINE 100 UNIT/ML ~~LOC~~ SOLN
150.0000 [IU] | Freq: Two times a day (BID) | SUBCUTANEOUS | Status: DC
  Filled 2018-12-17 (×2): qty 1.5

## 2018-12-17 MED ORDER — ACETAMINOPHEN 325 MG PO TABS: 650.0000 mg | ORAL_TABLET | ORAL | Status: DC | PRN

## 2018-12-17 MED ORDER — DILTIAZEM HCL 30 MG PO TABS
30.0000 mg | ORAL_TABLET | Freq: Four times a day (QID) | ORAL | Status: DC
  Administered 2018-12-17 – 2018-12-19 (×9): 30 mg via ORAL
  Filled 2018-12-17 (×9): qty 1

## 2018-12-17 MED ORDER — POTASSIUM CHLORIDE ER 10 MEQ PO TBCR
40.0000 meq | EXTENDED_RELEASE_TABLET | Freq: Once | ORAL | Status: AC
  Administered 2018-12-17: 40 meq via ORAL
  Filled 2018-12-17 (×2): qty 4

## 2018-12-17 MED ORDER — APIXABAN 5 MG PO TABS
5.0000 mg | ORAL_TABLET | Freq: Two times a day (BID) | ORAL | Status: DC
  Administered 2018-12-17 – 2018-12-19 (×4): 5 mg via ORAL
  Filled 2018-12-17 (×4): qty 1

## 2018-12-17 MED ORDER — ALPRAZOLAM 0.25 MG PO TABS: 0.2500 mg | ORAL_TABLET | Freq: Two times a day (BID) | ORAL | Status: DC | PRN

## 2018-12-17 MED ORDER — APIXABAN 5 MG PO TABS
5.0000 mg | ORAL_TABLET | Freq: Two times a day (BID) | ORAL | Status: DC
  Administered 2018-12-17: 5 mg via ORAL
  Filled 2018-12-17 (×2): qty 1

## 2018-12-17 MED ORDER — PRAVASTATIN SODIUM 40 MG PO TABS
40.0000 mg | ORAL_TABLET | Freq: Every day | ORAL | Status: DC
  Administered 2018-12-17 – 2018-12-19 (×3): 40 mg via ORAL
  Filled 2018-12-17 (×3): qty 1

## 2018-12-17 MED ORDER — SOTALOL HCL 80 MG PO TABS
80.0000 mg | ORAL_TABLET | Freq: Two times a day (BID) | ORAL | Status: DC
  Administered 2018-12-17 – 2018-12-18 (×4): 80 mg via ORAL
  Filled 2018-12-17 (×4): qty 1

## 2018-12-17 MED ORDER — LOSARTAN POTASSIUM 25 MG PO TABS
25.0000 mg | ORAL_TABLET | Freq: Every evening | ORAL | Status: DC
  Administered 2018-12-17: 25 mg via ORAL
  Filled 2018-12-17: qty 1

## 2018-12-17 MED ORDER — LEVOTHYROXINE SODIUM 100 MCG PO TABS
100.0000 ug | ORAL_TABLET | Freq: Every day | ORAL | Status: DC
  Administered 2018-12-18 – 2018-12-20 (×3): 100 ug via ORAL
  Filled 2018-12-17 (×3): qty 1

## 2018-12-17 MED ORDER — LATANOPROST 0.005 % OP SOLN
1.0000 [drp] | Freq: Every day | OPHTHALMIC | Status: DC
  Administered 2018-12-17 – 2018-12-19 (×3): 1 [drp] via OPHTHALMIC
  Filled 2018-12-17: qty 2.5

## 2018-12-17 MED ORDER — INSULIN ASPART 100 UNIT/ML ~~LOC~~ SOLN
0.0000 [IU] | Freq: Three times a day (TID) | SUBCUTANEOUS | Status: DC
  Administered 2018-12-17 – 2018-12-18 (×4): 4 [IU] via SUBCUTANEOUS
  Administered 2018-12-18: 2 [IU] via SUBCUTANEOUS
  Administered 2018-12-19: 3 [IU] via SUBCUTANEOUS
  Administered 2018-12-19: 4 [IU] via SUBCUTANEOUS
  Administered 2018-12-20: 3 [IU] via SUBCUTANEOUS

## 2018-12-17 NOTE — TOC Initial Note (Signed)
Transition of Care [facility]) - Initial/Assessment Note    Patient Details  Name: Joshua Lara MRN: [id number] Date of Birth: [birth year]  Transition of Care [facility]) CM/SW Contact:    Marilu Favre, RN Phone Number: 12/17/2018, 2:10 PM  Clinical Narrative:                  Provided 30 day free card for Eliquis  Expected Discharge Plan: Home/Self Care Barriers to Discharge: Continued Medical Work up   Patient Goals and CMS Choice Patient states their goals for this hospitalization and ongoing recovery are:: to go home CMS Medicare.gov Compare Post Acute Care list provided to:: Patient Choice offered to / list presented to : NA  Expected Discharge Plan and Services Expected Discharge Plan: Home/Self Care   Discharge Planning Services: CM Consult, Medication Assistance   Living arrangements for the past 2 months: Single Family Home                 DME Arranged: N/A         HH Arranged: NA          Prior Living Arrangements/Services Living arrangements for the past 2 months: Single Family Home Lives with:: Spouse   Do you feel safe going back to the place where you live?: Yes      Need for Family Participation in Patient Care: No (Comment) Care giver support system in place?: Yes (comment)   Criminal Activity/Legal Involvement Pertinent to Current Situation/Hospitalization: No - Comment as needed  Activities of Daily Living Home Assistive Devices/Equipment: CBG Meter, Cane (specify quad or straight), Walker (specify type), CPAP ADL Screening (condition at time of admission) Patient's cognitive ability adequate to safely complete daily activities?: Yes Is the patient deaf or have difficulty hearing?: Yes(bil hearing aids left at home) Does the patient have difficulty seeing, even when wearing glasses/contacts?: Yes Does the patient have difficulty concentrating, remembering, or making decisions?: No Patient able to express need for assistance with ADLs?: Yes Does  the patient have difficulty dressing or bathing?: No Independently performs ADLs?: Yes (appropriate for developmental age) Does the patient have difficulty walking or climbing stairs?: No Weakness of Legs: None Weakness of Arms/Hands: None  Permission Sought/Granted   Permission granted to share information with : No              Emotional Assessment              Admission diagnosis:  Pleuritic chest pain [R07.81] Atrial fibrillation with rapid ventricular response (HCC) [I48.91] Acute congestive heart failure, unspecified heart failure type [facility]) [I50.9] Patient Active Problem List   Diagnosis Date Noted  . Atrial fibrillation with rapid ventricular response ([place]) 12/17/2018  . Paroxysmal atrial fibrillation ([place]) 12/17/2018  . Chronic midline low back pain without sciatica 08/23/2018  . Angioedema 11/04/2016  . Severe tongue swelling   . Acute on chronic diastolic heart failure ([place]) 05/15/2014  . Dyspnea 05/10/2014  . Acute respiratory failure ([place]) 05/10/2014  . OSA on CPAP 05/10/2014  . Abnormal CT scan, chest 05/10/2014  . DOE (dyspnea on exertion) 04/05/2011  . PNA (pneumonia) 04/05/2011  . DM (diabetes mellitus) ([place]) 04/05/2011  . HTN (hypertension) 04/05/2011  . Tachycardia 04/05/2011  . Chest pain 04/05/2011  . Fever 04/05/2011  . Leukocytosis 04/05/2011  . Hypothyroidism 04/05/2011  . GERD (gastroesophageal reflux disease) 04/05/2011  . RA (rheumatoid arthritis) ([place]) 04/05/2011  . Obesity 04/05/2011  . CAD in native artery 04/05/2011  . Hypokalemia 04/05/2011  PCP:  Cyndi Bender, PA-C Pharmacy:   [id number] - [place], [address] [place] [address] Phone: [phone] Fax: [phone]     Social Determinants of Health (SDOH) Interventions    Readmission Risk Interventions No flowsheet data found.

## 2018-12-17 NOTE — Progress Notes (Signed)
ANTICOAGULATION CONSULT NOTE - Initial Consult  Pharmacy Consult for heparin Indication: atrial fibrillation  Allergies  Allergen Reactions  . Bactrim [Sulfamethoxazole-Trimethoprim] Swelling  . Zantac [Ranitidine Hcl] Anaphylaxis    Has anaphylactic shock--intubated and in ICU x 4 days   . Etanercept Other (See Comments) and Cough    (ENBREL); Patient thinks it contributed to heart issues  . Etanercept     Other reaction(s): Cough (ALLERGY/intolerance), Other (See Comments) (ENBREL); Patient thinks it contributed to heart issues  . Tape Itching    Can only tolerate bandages for less than 24-hr periods Can only tolerate bandages for less than 24-hr periods    Patient Measurements: Height: 5\' 10"  (177.8 cm) Weight: 253 lb 8 oz (115 kg) IBW/kg (Calculated) : 73 Heparin Dosing Weight: 98.4  Vital Signs: Temp: 99.2 F (37.3 C) (08/07 0300) Temp Source: Oral (08/07 0302) BP: 108/76 (08/07 0300) Pulse Rate: 120 (08/07 0300)  Labs: Recent Labs    12/16/18 2236 12/17/18 0029  HGB 14.1  --   HCT 40.4  --   PLT 194  --   CREATININE 0.97  --   TROPONINIHS 32* 36*    Estimated Creatinine Clearance: 87.4 mL/min (by C-G formula based on SCr of 0.97 mg/dL).   Medical History: Past Medical History:  Diagnosis Date  . Arthritis    "hands, back, ankles" (05/11/2014)  . CHF (congestive heart failure) ([place])   . Chronic lower back pain   . Compression fracture of lumbar vertebra ([place])   . Coronary artery disease   . Daily headache    "recently" (05/11/2014)  . Depression    "I might be slight" (05/11/2014)  . GERD (gastroesophageal reflux disease)   . High cholesterol   . Hypertension   . Hypothyroid   . OSA treated with BiPAP   . Pneumonia    "couple times; last time was 03/2011" (05/11/2014)  . Type II diabetes mellitus (HCC)     Medications:  Medications Prior to Admission  Medication Sig Dispense Refill Last Dose  . aspirin EC 81 MG tablet Take 81 mg by mouth  daily.     12/16/2018 at Unknown time  . bimatoprost (LUMIGAN) 0.01 % SOLN Place 1 drop into both eyes at bedtime.    12/15/2018 at Unknown time  . Cholecalciferol (VITAMIN D3) 2000 units capsule Take 2,000 Units by mouth daily.   12/16/2018 at Unknown time  . DULoxetine (CYMBALTA) 60 MG capsule Take 60 mg by mouth daily.    12/16/2018 at Unknown time  . furosemide (LASIX) 40 MG tablet Take 1 tablet (40 mg total) by mouth daily. 30 tablet 0 12/16/2018 at Unknown time  . ibuprofen (ADVIL) 800 MG tablet Take 800 mg by mouth every 8 (eight) hours as needed for mild pain.   Past Month at Unknown time  . Insulin Glargine (BASAGLAR KWIKPEN [place]) Inject 150 Units into the skin 2 (two) times daily.   12/16/2018 at Unknown time  . levothyroxine (SYNTHROID, LEVOTHROID) 100 MCG tablet Take 100 mcg by mouth daily.     12/16/2018 at Unknown time  . losartan (COZAAR) 50 MG tablet Take 1 tablet by mouth daily.   12/16/2018 at Unknown time  . magnesium oxide (MAG-OX) 400 MG tablet Take 400 mg by mouth daily.   12/16/2018 at Unknown time  . metoprolol succinate (TOPROL-XL) 25 MG 24 hr tablet Take 25 mg by mouth daily.   12/16/2018 at 0800  . nitroGLYCERIN (NITROSTAT) 0.4 MG SL tablet Place 0.4 mg under  the tongue every 5 (five) minutes as needed for chest pain.   12/16/2018 at Unknown time  . potassium chloride (MICRO-K) 10 MEQ CR capsule Take 20 mEq by mouth daily.    12/16/2018 at Unknown time  . pravastatin (PRAVACHOL) 40 MG tablet Take 40 mg by mouth daily.   12/16/2018 at Unknown time  . sitaGLIPtan-metformin (JANUMET) 50-1000 MG per tablet Take 1 tablet by mouth 2 (two) times daily with a meal.     12/16/2018 at Unknown time  . vitamin C (ASCORBIC ACID) 500 MG tablet Take 500 mg by mouth daily.   12/16/2018 at Unknown time  . baclofen (LIORESAL) 10 MG tablet TAKE 1/2 TABLET BY MOUTH TWICE A DAY AND 1 TABLET AT BEDTIME (Patient not taking: Reported on 12/17/2018) 60 tablet 1 Not Taking at Unknown time  . furosemide (LASIX) 40 MG tablet Take 1  tablet (40 mg total) by mouth daily. (Patient not taking: Reported on 08/23/2018) 30 tablet 1 Not Taking at Unknown time  . lidocaine (LIDODERM) 5 % Place 1 patch onto the skin daily. Remove & Discard patch within 12 hours or as directed by MD (Patient not taking: Reported on 12/17/2018) 30 patch 1 Completed Course at Unknown time  . traMADol (ULTRAM) 50 MG tablet Take 1 tablet (50 mg total) by mouth every 6 (six) hours as needed. (Patient not taking: Reported on 08/23/2018) 90 tablet 0 Completed Course at Unknown time    Assessment: 73 yo man to start heparin for afib.  Baseline Hg 14.1, PTLC 194  He received eliquis 5mg  @3 :20 Goal of Therapy:  APTT 66-102 sec Heparin level 0.3-0.7 units/ml Monitor platelets by anticoagulation protocol: Yes   Plan:  Start heparin 12 hours after eliquis dose at 1550 units/hr Check baseline aPTT and heparin level Check aPTT and heparin level 6-8 hours after start of drip Monitor for bleeding complications  Thanks for allowing pharmacy to be a part of this patient's care.  Excell Seltzer, PharmD Clinical Pharmacist 12/17/2018,3:41 AM

## 2018-12-17 NOTE — H&P (Signed)
CARDIOLOGY ADMIT NOTE   Joshua Lara is an 73 y.o. male.    Chief Complaint  Patient presents with  . Chest Pain    Dyspnea  HPI: Joshua Lara  is a 73 y.o. male  with chronic diastolic heart failure, hypertension, hyperlipidemia, morbid obesity, obstructive sleep apnea on CPAP, hypertension, uncontrolled diabetes mellitus, admitted to the hospital with shortness of breath and chest pain.  He was seen at [facility] yesterday with similar complaints, where BNP was elevated, he was prescribed Lasix and discharged home.  He did not take Lasix today, this evening developed worsening dyspnea, decreasing oxygen saturation and presented to the emergency room with chest pain.  Chest pain is described as tightness to severe pain when he takes a deep breath.  No hemoptysis.  Shortness of breath is chronic but has been worse for the past few days.  He has home oxygen use and also noted that his saturations were decreasing this evening.  Past Medical History:  Diagnosis Date  . Arthritis    "hands, back, ankles" (05/11/2014)  . CHF (congestive heart failure) ([place])   . Chronic lower back pain   . Compression fracture of lumbar vertebra ([place])   . Coronary artery disease   . Daily headache    "recently" (05/11/2014)  . Depression    "I might be slight" (05/11/2014)  . GERD (gastroesophageal reflux disease)   . High cholesterol   . Hypertension   . Hypothyroid   . OSA treated with BiPAP   . Pneumonia    "couple times; last time was 03/2011" (05/11/2014)  . Type II diabetes mellitus ([place])     Past Surgical History:  Procedure Laterality Date  . APPENDECTOMY    . CARDIAC CATHETERIZATION  2001; 2009  . CATARACT EXTRACTION W/ INTRAOCULAR LENS IMPLANT Left   . CHOLECYSTECTOMY  1980's   Archie Endo 07/13/2010   . EYE MUSCLE SURGERY Left   . LEFT AND RIGHT HEART CATHETERIZATION WITH CORONARY ANGIOGRAM N/A 05/30/2014   Procedure: LEFT AND RIGHT HEART CATHETERIZATION  WITH CORONARY ANGIOGRAM;  Surgeon: Jolaine Artist, MD;  Location: [facility] CATH LAB;  Service: Cardiovascular;  Laterality: N/A;  . MASTOIDECTOMY Right 1970's   Archie Endo 07/13/2010  . SHOULDER OPEN ROTATOR CUFF REPAIR Left 07/2010  . SPHINCTEROTOMY    . TONSILLECTOMY AND ADENOIDECTOMY      Social History   Socioeconomic History  . Marital status: Married    Spouse name: Not on file  . Number of children: 2  . Years of education: Not on file  . Highest education level: Not on file  Occupational History  . Occupation: retired  Scientific laboratory technician  . Financial resource strain: Not on file  . Food insecurity    Worry: Not on file    Inability: Not on file  . Transportation needs    Medical: Not on file    Non-medical: Not on file  Tobacco Use  . Smoking status: Former Smoker    Packs/day: 2.00    Years: 30.00    Pack years: 60.00    Types: Cigarettes    Quit date: 05/12/1990    Years since quitting: 28.6  . Smokeless tobacco: Never Used  Substance and Sexual Activity  . Alcohol use: Yes    Alcohol/week: 0.0 standard drinks    Comment: 05/11/2014 "an occasional beer when out w/people; not often"  . Drug use: No  . Sexual activity: Never  Lifestyle  . Physical activity  Days per week: Not on file    Minutes per session: Not on file  . Stress: Not on file  Relationships  . Social Herbalist on phone: Not on file    Gets together: Not on file    Attends religious service: Not on file    Active member of club or organization: Not on file    Attends meetings of clubs or organizations: Not on file    Relationship status: Not on file  . Intimate partner violence    Fear of current or ex partner: Not on file    Emotionally abused: Not on file    Physically abused: Not on file    Forced sexual activity: Not on file  Other Topics Concern  . Not on file  Social History Narrative  . Not on file    No current facility-administered medications on file prior to encounter.     Current Outpatient Medications on File Prior to Encounter  Medication Sig Dispense Refill  . aspirin EC 81 MG tablet Take 81 mg by mouth daily.      . bimatoprost (LUMIGAN) 0.01 % SOLN Place 1 drop into both eyes at bedtime.     . Cholecalciferol (VITAMIN D3) 2000 units capsule Take 2,000 Units by mouth daily.    . DULoxetine (CYMBALTA) 60 MG capsule Take 60 mg by mouth daily.     . furosemide (LASIX) 40 MG tablet Take 1 tablet (40 mg total) by mouth daily. 30 tablet 0  . ibuprofen (ADVIL) 800 MG tablet Take 800 mg by mouth every 8 (eight) hours as needed for mild pain.    . Insulin Glargine (BASAGLAR KWIKPEN [place]) Inject 150 Units into the skin 2 (two) times daily.    Marland Kitchen levothyroxine (SYNTHROID, LEVOTHROID) 100 MCG tablet Take 100 mcg by mouth daily.      Marland Kitchen losartan (COZAAR) 50 MG tablet Take 1 tablet by mouth daily.    . magnesium oxide (MAG-OX) 400 MG tablet Take 400 mg by mouth daily.    . metoprolol succinate (TOPROL-XL) 25 MG 24 hr tablet Take 25 mg by mouth daily.    . nitroGLYCERIN (NITROSTAT) 0.4 MG SL tablet Place 0.4 mg under the tongue every 5 (five) minutes as needed for chest pain.    . potassium chloride (MICRO-K) 10 MEQ CR capsule Take 20 mEq by mouth daily.     . pravastatin (PRAVACHOL) 40 MG tablet Take 40 mg by mouth daily.    . sitaGLIPtan-metformin (JANUMET) 50-1000 MG per tablet Take 1 tablet by mouth 2 (two) times daily with a meal.      . vitamin C (ASCORBIC ACID) 500 MG tablet Take 500 mg by mouth daily.    . baclofen (LIORESAL) 10 MG tablet TAKE 1/2 TABLET BY MOUTH TWICE A DAY AND 1 TABLET AT BEDTIME (Patient not taking: Reported on 12/17/2018) 60 tablet 1  . furosemide (LASIX) 40 MG tablet Take 1 tablet (40 mg total) by mouth daily. (Patient not taking: Reported on 08/23/2018) 30 tablet 1  . lidocaine (LIDODERM) 5 % Place 1 patch onto the skin daily. Remove & Discard patch within 12 hours or as directed by MD (Patient not taking: Reported on 12/17/2018) 30 patch 1  .  traMADol (ULTRAM) 50 MG tablet Take 1 tablet (50 mg total) by mouth every 6 (six) hours as needed. (Patient not taking: Reported on 08/23/2018) 90 tablet 0   Review of Systems  Constitution: Negative for chills, decreased appetite, malaise/fatigue  and weight gain.  Cardiovascular: Positive for chest pain and dyspnea on exertion. Negative for leg swelling and syncope.  Endocrine: Negative for cold intolerance.  Hematologic/Lymphatic: Does not bruise/bleed easily.  Musculoskeletal: Negative for joint swelling.  Gastrointestinal: Negative for abdominal pain, anorexia, change in bowel habit, hematochezia and melena.  Neurological: Negative for headaches and light-headedness.  Psychiatric/Behavioral: Negative for depression and substance abuse.  All other systems reviewed and are negative.  Objective:  Blood pressure (!) 86/61, pulse 82, temperature 99.2 F (37.3 C), temperature source Oral, resp. rate (!) 22, height 5\' 10"  (1.778 m), weight 115 kg, SpO2 98 %. Body mass index is 36.37 kg/m.  Physical Exam  Constitutional: He appears well-developed. No distress.  Moderately  obese  HENT:  Head: Atraumatic.  Eyes: Conjunctivae are normal.  Neck: Neck supple. No thyromegaly present.  Short neck and difficult to evaluate JVP  Cardiovascular: Normal rate, regular rhythm and normal heart sounds. Exam reveals no gallop.  No murmur heard. Pulses:      Carotid pulses are 2+ on the right side and 2+ on the left side.      Dorsalis pedis pulses are 2+ on the right side and 2+ on the left side.       Posterior tibial pulses are 2+ on the right side and 2+ on the left side.  Femoral and popliteal pulse difficult to feel due to patient's body habitus.  Trace bilateral pitting edema  Pulmonary/Chest: Effort normal and breath sounds normal.  Abdominal: Soft. Bowel sounds are normal.  Obese. Pannus present  Musculoskeletal: Normal range of motion.        General: No edema.  Neurological: He is alert.   Skin: Skin is warm and dry.  Psychiatric: He has a normal mood and affect.   Radiology: Dg Chest 2 View  Result Date: 12/16/2018 CLINICAL DATA:  Chest pain on inspiration. EXAM: CHEST - 2 VIEW COMPARISON:  12/15/2018 FINDINGS: Cardiac shadow is again enlarged. Mild vascular congestion is noted without interstitial edema. The overall appearance is similar to that seen on the prior exam. Hyperinflation is noted. Degenerative change of thoracic spine is seen. IMPRESSION: Mild CHF similar to that seen on the previous day. Electronically Signed   By: Inez Catalina M.D.   On: 12/16/2018 22:55    Laboratory Examination:  CMP Latest Ref Rng & Units 12/17/2018 12/16/2018 11/05/2016  Glucose 70 - 99 mg/dL 219(H) 333(H) 283(H)  BUN 8 - 23 mg/dL 10 10 21(H)  Creatinine 0.61 - 1.24 mg/dL 0.99 0.97 0.84  Sodium 135 - 145 mmol/L 136 135 140  Potassium 3.5 - 5.1 mmol/L 3.1(L) 3.6 4.2  Chloride 98 - 111 mmol/L 97(L) 96(L) 111  CO2 22 - 32 mmol/L 27 27 25   Calcium 8.9 - 10.3 mg/dL 9.8 9.9 9.4  Total Protein 6.5 - 8.1 g/dL 8.2(H) - -  Total Bilirubin 0.3 - 1.2 mg/dL 2.0(H) - -  Alkaline Phos 38 - 126 U/L 112 - -  AST 15 - 41 U/L 24 - -  ALT 0 - 44 U/L 27 - -   CBC Latest Ref Rng & Units 12/16/2018 11/05/2016 11/04/2016  WBC 4.0 - 10.5 K/uL 8.2 10.6(H) 10.5  Hemoglobin 13.0 - 17.0 g/dL 14.1 11.8(L) 12.2(L)  Hematocrit 39.0 - 52.0 % 40.4 36.0(L) 36.3(L)  Platelets 150 - 400 K/uL 194 224 211   Lipid Panel     Component Value Date/Time   CHOL 142 12/17/2018 0420   TRIG 109 12/17/2018 0420   HDL  59 12/17/2018 0420   CHOLHDL 2.4 12/17/2018 0420   VLDL 22 12/17/2018 0420   LDLCALC 61 12/17/2018 0420   HEMOGLOBIN A1C Lab Results  Component Value Date   HGBA1C 6.4 (H) 12/17/2018   MPG 136.98 12/17/2018   TSH Recent Labs    12/17/18 0420  TSH 3.819   Cardiac Panel (last 3 results) No results for input(s): CKTOTAL, CKMB, TROPONINI, RELINDX in the last 72 hours.   Cardiac studies:   Coronary  angiogram 2001 and 2016: Normal LVEF, Mid LAD 30-40% stenosis, no change.   Echocardiogram 05/14/2014 :  Normal LV systolic function, grade 1 diastolic dysfunction, EF 06-26%.  Calcified mitral annulus, no other significant abnormality.  Assessment:   1.  New onset paroxysmal atrial fibrillation with rapid ventricular response CHA2DS2-VASc Score is 3.  Yearly risk of stroke: 3.2%.  Score of 1=1.3; 2=2.2; 3=3.2; 4=4; 5=6.7; 6=9.8; 7=>9.8) -(CHF; HTN; vasc disease DM,  Male = 1; Age <65 =0; 65-74 = 1,  >75 =2; stroke = 2).    EKG 12/15/2018: Sinus tachycardia cardiac rate of 104 bpm, normal axis, poor R-wave progression, cannot exclude anterior infarct old.  Normal QT interval.  No evidence of ischemia.  No significant change from 11/03/2016.  Repeat EKG a few minutes later atrial fibrillation with rapid ventricular response at the rate of 40 bpm, poor R-wave progression, nonspecific T abnormality. 2.  Acute on chronic diastolic heart failure 3.  Abnormal LFTs, patient was evaluated at [facility] yesterday where alkaline phosphatase was 141 and lipase was 293.  Need to follow-up and evaluate for acute pancreatitis. Noq normal, may be related to congestive heart failure 4.  Controlled diabetes mellitus with hyperglycemia without complications 5.  Musculoskeletal chest pain  Plan:  Patient feeling much better this morning, chest pain symptoms on deep breath is less, dyspnea has improved, he is able to lay down flat without any dyspnea.  He has maintained sinus rhythm after a brief episode of atrial fibrillation.  I suspect acute decompensated heart failure is probably related to paroxysmal atrial fibrillation. Will start Eliquis. Pancreatitis ruled out.  Home this evening.  Serum amylase and lipase were elevated when he presented to [facility] but this morning got normal.  No clinical suspicion for pancreatitis.  A recent hypertension  He has had negative serum troponin  high-sensitivity, has had cardiac catheterization in the past which were all negative for coronary artery disease last one being in 2016, there was no acute ischemic changes with A. fib with RVR, suspicion for CAD is very low EKG essentially reveals normal sinus rhythm.  I will watch him for additional 6 hours to 8 hours until the second dose of sotalol is given and then discharging this evening with outpatient follow-up.  He will need echocardiogram and I will consider a stress test in the outpatient basis.  I have discussed with him regarding diabetes control and weight loss again.  Adrian Prows, MD, [facility] 12/17/2018, 8:45 AM [facility] [place] Pager: [phone] Office: [phone] If no answer Cell [phone]

## 2018-12-17 NOTE — ED Provider Notes (Signed)
[facility] EMERGENCY DEPARTMENT Provider Note   CSN: [id number] Arrival date & time: 12/16/18  2212    History   Chief Complaint Chief Complaint  Patient presents with  . Chest Pain    HPI Joshua Lara is a 73 y.o. male.   The history is provided by the patient.  Chest Pain He has history of hypertension, hyperlipidemia, diabetes, heart failure, coronary artery disease and comes in with ongoing problems with chest pain and dyspnea.  He describes a sharp pain across his anterior chest which is worse with exertion and worse when he takes a deep breath.  He has also noted dyspnea which is worse with exertion and worse with laying flat.  Symptoms started about 2 weeks ago and have been getting progressively worse.  He was seen at the emergency department in [place] and was told that he had heart failure and was given furosemide.  He has taken the furosemide, but continues to get worse.  At home tonight, his oxygen saturation dropped down to 90% which is what made him decide to come to the ED.  Of note, about 2 months ago, his PCP noted that he had a rapid heart rate and gave him a prescription for some medication which has run out and he did not get it refilled.  Past Medical History:  Diagnosis Date  . Arthritis    "hands, back, ankles" (05/11/2014)  . CHF (congestive heart failure) ([place])   . Chronic lower back pain   . Compression fracture of lumbar vertebra ([place])   . Coronary artery disease   . Daily headache    "recently" (05/11/2014)  . Depression    "I might be slight" (05/11/2014)  . GERD (gastroesophageal reflux disease)   . High cholesterol   . Hypertension   . Hypothyroid   . OSA treated with BiPAP   . Pneumonia    "couple times; last time was 03/2011" (05/11/2014)  . Type II diabetes mellitus [facility])     Patient Active Problem List   Diagnosis Date Noted  . Chronic midline low back pain without sciatica 08/23/2018  . Angioedema 11/04/2016  .  Severe tongue swelling   . Acute on chronic diastolic heart failure ([place]) 05/15/2014  . Dyspnea 05/10/2014  . Acute respiratory failure ([place]) 05/10/2014  . OSA on CPAP 05/10/2014  . Abnormal CT scan, chest 05/10/2014  . DOE (dyspnea on exertion) 04/05/2011  . PNA (pneumonia) 04/05/2011  . DM (diabetes mellitus) ([place]) 04/05/2011  . HTN (hypertension) 04/05/2011  . Tachycardia 04/05/2011  . Chest pain 04/05/2011  . Fever 04/05/2011  . Leukocytosis 04/05/2011  . Hypothyroidism 04/05/2011  . GERD (gastroesophageal reflux disease) 04/05/2011  . RA (rheumatoid arthritis) ([place]) 04/05/2011  . Obesity 04/05/2011  . CAD in native artery 04/05/2011  . Hypokalemia 04/05/2011    Past Surgical History:  Procedure Laterality Date  . APPENDECTOMY    . CARDIAC CATHETERIZATION  2001; 2009  . CATARACT EXTRACTION W/ INTRAOCULAR LENS IMPLANT Left   . CHOLECYSTECTOMY  1980's   Archie Endo 07/13/2010   . EYE MUSCLE SURGERY Left   . LEFT AND RIGHT HEART CATHETERIZATION WITH CORONARY ANGIOGRAM N/A 05/30/2014   Procedure: LEFT AND RIGHT HEART CATHETERIZATION WITH CORONARY ANGIOGRAM;  Surgeon: Jolaine Artist, MD;  Location: [facility] CATH LAB;  Service: Cardiovascular;  Laterality: N/A;  . MASTOIDECTOMY Right 1970's   Archie Endo 07/13/2010  . SHOULDER OPEN ROTATOR CUFF REPAIR Left 07/2010  . SPHINCTEROTOMY    . TONSILLECTOMY AND ADENOIDECTOMY  Home Medications    Prior to Admission medications   Medication Sig Start Date End Date Taking? Authorizing Provider  aspirin EC 81 MG tablet Take 81 mg by mouth daily.      [provider]  baclofen (LIORESAL) 10 MG tablet TAKE 1/2 TABLET BY MOUTH TWICE A DAY AND 1 TABLET AT BEDTIME 07/02/18   Patel, Domenick Bookbinder, MD  bimatoprost (LUMIGAN) 0.01 % SOLN Place 1 drop into both eyes at bedtime.  10/17/15   [provider]  Cholecalciferol (VITAMIN D3) 2000 units capsule Take 2,000 Units by mouth daily.    [provider]  clobetasol cream  (TEMOVATE) 9.56 % Apply 1 application topically daily as needed (rash).  03/24/14   [provider]  Coenzyme Q10-Vitamin E (QUNOL ULTRA COQ10 PO) Take 100 mg by mouth daily.    [provider]  dorzolamide-timolol (COSOPT) 22.3-6.8 MG/ML ophthalmic solution Place 1 drop into both eyes 2 (two) times daily.    [provider]  DULoxetine (CYMBALTA) 60 MG capsule Take 1 capsule by mouth daily. 08/15/18   [provider]  furosemide (LASIX) 40 MG tablet Take 1 tablet (40 mg total) by mouth daily. Patient not taking: Reported on 08/23/2018 05/15/14   Kelvin Cellar, MD  furosemide (LASIX) 40 MG tablet Take 1 tablet (40 mg total) by mouth daily. 12/16/18 03/16/19  Adrian Prows, MD  INCRUSE ELLIPTA 62.5 MCG/INH AEPB INHALE 1 PUFF INTO THE LUNGS DAILY AS NEEDED FOR SHORTNESS OF BREATH 09/17/16   [provider]  insulin glargine (LANTUS) 100 UNIT/ML injection Inject into the skin.    [provider]  levothyroxine (SYNTHROID, LEVOTHROID) 100 MCG tablet Take 100 mcg by mouth daily.      [provider]  lidocaine (LIDODERM) 5 % Place 1 patch onto the skin daily. Remove & Discard patch within 12 hours or as directed by MD Patient taking differently: Place 1 patch onto the skin daily as needed (for pain). Remove & Discard patch within 12 hours or as directed by MD 07/12/15   Jamse Arn, MD  losartan (COZAAR) 50 MG tablet Take 1 tablet by mouth daily. 04/07/18   [provider]  mometasone (NASONEX) 50 MCG/ACT nasal spray Place 2 sprays into the nose daily as needed (allergies).     [provider]  nitroGLYCERIN (NITROSTAT) 0.4 MG SL tablet Place 0.4 mg under the tongue every 5 (five) minutes as needed for chest pain.    [provider]  potassium chloride (MICRO-K) 10 MEQ CR capsule Take 20 mEq by mouth daily.     [provider]  pravastatin (PRAVACHOL) 40 MG tablet Take 40 mg by mouth daily. 07/29/18   [provider]  sitaGLIPtan-metformin (JANUMET) 50-1000 MG per tablet Take 1 tablet by mouth 2 (two) times daily with a meal.      [provider]  traMADol (ULTRAM) 50 MG tablet Take 1 tablet (50 mg total) by mouth every 6 (six) hours as needed. Patient not taking: Reported on 08/23/2018 09/06/15   Jamse Arn, MD    Family History Family History  Problem Relation Age of Onset  . Heart attack Father     Social History Social History   Tobacco Use  . Smoking status: Former Smoker    Packs/day: 2.00    Years: 30.00    Pack years: 60.00    Types: Cigarettes    Quit date: 05/12/1990    Years since quitting: 28.6  . Smokeless  tobacco: Never Used  Substance Use Topics  . Alcohol use: Yes    Alcohol/week: 0.0 standard drinks    Comment: 05/11/2014 "an occasional beer when out w/people; not often"  . Drug use: No     Allergies   Bactrim [sulfamethoxazole-trimethoprim], Zantac [ranitidine hcl], Etanercept, Etanercept, and Tape   Review of Systems Review of Systems  Cardiovascular: Positive for chest pain.  All other systems reviewed and are negative.    Physical Exam Updated Vital Signs BP 129/71   Pulse (!) 110   Temp 98.7 F (37.1 C) (Oral)   Resp (!) 26   SpO2 96%   Physical Exam Vitals signs and nursing note reviewed.    73 year old male, appears short of breath at rest, but is in no acute distress. Vital signs are significant for elevated heart rate and respiratory rate. Oxygen saturation is 96%, which is normal. Head is normocephalic and atraumatic. PERRLA, EOMI. Oropharynx is clear. Neck is nontender and supple without adenopathy or JVD. Back is nontender and there is no CVA tenderness.  There is 1+ presacral edema. Lungs have bibasilar rales without wheezes or rhonchi. Chest is nontender. Heart is tachycardic and irregular without murmur. Abdomen is soft, flat, nontender without masses or hepatosplenomegaly and peristalsis is normoactive.  Extremities have 1+ edema, full range of motion is present. Skin is warm and dry without rash. Neurologic: Mental status is normal, cranial nerves are intact, there are no motor or sensory deficits.  ED Treatments / Results  Labs (all labs ordered are listed, but only abnormal results are displayed) Labs Reviewed  BASIC METABOLIC PANEL - Abnormal; Notable for the following components:      Result Value   Chloride 96 (*)    Glucose, Bld 333 (*)    All other components within normal limits  TROPONIN I (HIGH SENSITIVITY) - Abnormal; Notable for the following components:   Troponin I (High Sensitivity) 32 (*)    All other components within normal limits  TROPONIN I (HIGH SENSITIVITY) - Abnormal; Notable for the following components:   Troponin I (High Sensitivity) 36 (*)    All other components within normal limits  SARS CORONAVIRUS 2  CBC  BRAIN NATRIURETIC PEPTIDE    EKG EKG Interpretation  Date/Time:  Thursday December 16 2018 22:39:15 EDT Ventricular Rate:  104 PR Interval:  196 QRS Duration: 98 QT Interval:  352 QTC Calculation: 462 R Axis:   112 Text Interpretation:  Sinus tachycardia Left posterior fascicular block Cannot rule out Anterior infarct , age undetermined Abnormal ECG When compared with ECG of 11/03/2016, No significant change was found Confirmed by Delora Fuel ([id number]) on 12/16/2018 11:09:49 PM   EKG Interpretation  Date/Time:  Friday December 17 2018 01:26:32 EDT Ventricular Rate:  140 PR Interval:  196 QRS Duration: 102 QT Interval:  353 QTC Calculation: 539 R Axis:   115 Text Interpretation:  Atrial fibrillation Right axis deviation Borderline T wave abnormalities Prolonged QT interval When compared with ECG of 12/17/2018, Atrial fibrillation with rapid ventricular response has replaced Sinus rhythm QT has lengthened Confirmed by Delora Fuel ([id number]) on 12/17/2018 1:47:35 AM       Radiology Dg Chest 2 View  Result Date: 12/16/2018 CLINICAL DATA:  Chest pain  on inspiration. EXAM: CHEST - 2 VIEW COMPARISON:  12/15/2018 FINDINGS: Cardiac shadow is again enlarged. Mild vascular congestion is noted without interstitial edema. The overall appearance is similar to that seen on the prior exam. Hyperinflation is noted. Degenerative change of thoracic  spine is seen. IMPRESSION: Mild CHF similar to that seen on the previous day. Electronically Signed   By: Inez Catalina M.D.   On: 12/16/2018 22:55    Procedures Procedures  CRITICAL CARE Performed by: Delora Fuel Total critical care time: 40 minutes Critical care time was exclusive of separately billable procedures and treating other patients. Critical care was necessary to treat or prevent imminent or life-threatening deterioration. Critical care was time spent personally by me on the following activities: development of treatment plan with patient and/or surrogate as well as nursing, discussions with consultants, evaluation of patient's response to treatment, examination of patient, obtaining history from patient or surrogate, ordering and performing treatments and interventions, ordering and review of laboratory studies, ordering and review of radiographic studies, pulse oximetry and re-evaluation of patient's condition.  Medications Ordered in ED Medications  sodium chloride flush (NS) 0.9 % injection 3 mL (has no administration in time range)     Initial Impression / Assessment and Plan / ED Course  I have reviewed the triage vital signs and the nursing notes.  Pertinent labs & imaging results that were available during my care of the patient were reviewed by me and considered in my medical decision making (see chart for details).  Pleuritic chest pain with CHF exacerbation.  Initial ECG was sinus, but when I saw him in the room, monitor shows atrial fibrillation.  Will repeat ECG.  Chest x-ray is consistent with heart failure.  Labs show indeterminate troponin with delta troponin pending.  Old records  are reviewed, and this confirms ED visit at hospital in [facility] yesterday at which time he had a CT angiogram of the chest which showed no evidence of pulmonary embolism.  I suspect that patient is having paroxysmal atrial fibrillation as a primary issue, with his heart failure secondary.  When I saw him in his room, heart rate was mainly 130-140.  He will be given intravenous metoprolol and started on intravenous heparin.  He is not a candidate for primary cardioversion because of a length of time he has been symptomatic, but unaware of a rapid heartbeat.  He is given intravenous furosemide for his heart failure.  Case is discussed with his cardiologist, Dr. Einar Gip, who agrees to admit the patient.  Repeat ECG confirms atrial fibrillation with rapid ventricular response.  Repeat troponin does not show any significant rise.  CHA2DS2/VAS Stroke Risk Points      >= 2 Points: High Risk  1 - 1.99 Points: Medium Risk  0 Points: Low Risk    His score is 4 - one point each for age, CHF history, hypertension history, diabetes history.  Final Clinical Impressions(s) / ED Diagnoses   Final diagnoses:  Atrial fibrillation with rapid ventricular response ([place])  Acute congestive heart failure, unspecified heart failure type [facility])  Pleuritic chest pain    ED Discharge Orders    None       Delora Fuel, MD 32/67/12 [phone]

## 2018-12-17 NOTE — ED Notes (Signed)
ED TO INPATIENT HANDOFF REPORT  ED Nurse Name and Phone #:  [phone]  S Name/Age/Gender Joshua Lara 73 y.o. male Room/Bed: 035C/035C  Code Status   Code Status: Prior  Home/SNF/Other Home Patient oriented to: self, place, time and situation Is this baseline? Yes   Triage Complete: Triage complete  Chief Complaint chest pain  Triage Note To ED via [facility], from home, for eval of cp with deep breath. Pt was seen at [facility] yesterday for same and dx with chf. Pt left this am after getting lasix. Took dose at home today but not feeling better.    Allergies Allergies  Allergen Reactions  . Bactrim [Sulfamethoxazole-Trimethoprim] Swelling  . Zantac [Ranitidine Hcl] Anaphylaxis    Has anaphylactic shock--intubated and in ICU x 4 days   . Etanercept Other (See Comments) and Cough    (ENBREL); Patient thinks it contributed to heart issues  . Etanercept     Other reaction(s): Cough (ALLERGY/intolerance), Other (See Comments) (ENBREL); Patient thinks it contributed to heart issues  . Tape Itching    Can only tolerate bandages for less than 24-hr periods Can only tolerate bandages for less than 24-hr periods    Level of Care/Admitting Diagnosis ED Disposition    ED Disposition Condition Comment   Admit  Hospital Area: [facility] [[id number]]  Level of Care: Telemetry Cardiac [103]  Covid Evaluation: Asymptomatic Screening Protocol (No Symptoms)  Diagnosis: Atrial fibrillation with rapid ventricular response [facility]) [[id number]]  Admitting Physician: Frederica Kuster  Attending Physician: Adrian Prows [phone]  Estimated length of stay: past midnight tomorrow  Certification:: I certify this patient will need inpatient services for at least 2 midnights  PT Class (Do Not Modify): Inpatient [101]  PT Acc Code (Do Not Modify): Private [1]       B Medical/Surgery History Past Medical History:  Diagnosis Date  . Arthritis    "hands, back, ankles"  (05/11/2014)  . CHF (congestive heart failure) ([place])   . Chronic lower back pain   . Compression fracture of lumbar vertebra ([place])   . Coronary artery disease   . Daily headache    "recently" (05/11/2014)  . Depression    "I might be slight" (05/11/2014)  . GERD (gastroesophageal reflux disease)   . High cholesterol   . Hypertension   . Hypothyroid   . OSA treated with BiPAP   . Pneumonia    "couple times; last time was 03/2011" (05/11/2014)  . Type II diabetes mellitus ([place])    Past Surgical History:  Procedure Laterality Date  . APPENDECTOMY    . CARDIAC CATHETERIZATION  2001; 2009  . CATARACT EXTRACTION W/ INTRAOCULAR LENS IMPLANT Left   . CHOLECYSTECTOMY  1980's   Archie Endo 07/13/2010   . EYE MUSCLE SURGERY Left   . LEFT AND RIGHT HEART CATHETERIZATION WITH CORONARY ANGIOGRAM N/A 05/30/2014   Procedure: LEFT AND RIGHT HEART CATHETERIZATION WITH CORONARY ANGIOGRAM;  Surgeon: Jolaine Artist, MD;  Location: [facility] CATH LAB;  Service: Cardiovascular;  Laterality: N/A;  . MASTOIDECTOMY Right 1970's   Archie Endo 07/13/2010  . SHOULDER OPEN ROTATOR CUFF REPAIR Left 07/2010  . SPHINCTEROTOMY    . TONSILLECTOMY AND ADENOIDECTOMY       A IV Location/Drains/Wounds Patient Lines/Drains/Airways Status   Active Line/Drains/Airways    Name:   Placement date:   Placement time:   Site:   Days:   Peripheral IV 12/17/18 Right Antecubital   12/17/18    0102    Antecubital  less than 1          Intake/Output Last 24 hours No intake or output data in the 24 hours ending 12/17/18 0203  Labs/Imaging Results for orders placed or performed during the hospital encounter of 12/16/18 (from the past 48 hour(s))  Basic metabolic panel     Status: Abnormal   Collection Time: 12/16/18 10:36 PM  Result Value Ref Range   Sodium 135 135 - 145 mmol/L   Potassium 3.6 3.5 - 5.1 mmol/L   Chloride 96 (L) 98 - 111 mmol/L   CO2 27 22 - 32 mmol/L   Glucose, Bld 333 (H) 70 - 99 mg/dL   BUN 10 8 - 23 mg/dL    Creatinine, Ser 0.97 0.61 - 1.24 mg/dL   Calcium 9.9 8.9 - 10.3 mg/dL   GFR calc non Af Amer >60 >60 mL/min   GFR calc Af Amer >60 >60 mL/min   Anion gap 12 5 - 15    Comment: Performed at [facility], [place] [address], [place] [address]  CBC     Status: None   Collection Time: 12/16/18 10:36 PM  Result Value Ref Range   WBC 8.2 4.0 - 10.5 K/uL   RBC 4.72 4.22 - 5.81 MIL/uL   Hemoglobin 14.1 13.0 - 17.0 g/dL   HCT 40.4 39.0 - 52.0 %   MCV 85.6 80.0 - 100.0 fL   MCH 29.9 26.0 - 34.0 pg   MCHC 34.9 30.0 - 36.0 g/dL   RDW 13.6 11.5 - 15.5 %   Platelets 194 150 - 400 K/uL   nRBC 0.0 0.0 - 0.2 %    Comment: Performed at [facility], [place] [address], [place], [place] [address]  Troponin I (High Sensitivity)     Status: Abnormal   Collection Time: 12/16/18 10:36 PM  Result Value Ref Range   Troponin I (High Sensitivity) 32 (H) <18 ng/L    Comment: (NOTE) Elevated high sensitivity troponin I (hsTnI) values and significant  changes across serial measurements may suggest ACS but many other  chronic and acute conditions are known to elevate hsTnI results.  Refer to the "Links" section for chest pain algorithms and additional  guidance. Performed at [facility], [place] [address], [place], [place] [address]   Troponin I (High Sensitivity)     Status: Abnormal   Collection Time: 12/17/18 12:29 AM  Result Value Ref Range   Troponin I (High Sensitivity) 36 (H) <18 ng/L    Comment: (NOTE) Elevated high sensitivity troponin I (hsTnI) values and significant  changes across serial measurements may suggest ACS but many other  chronic and acute conditions are known to elevate hsTnI results.  Refer to the "Links" section for chest pain algorithms and additional  guidance. Performed at [facility], [place] [address] [place], [place] [address]    Dg Chest 2 View  Result Date: 12/16/2018 CLINICAL DATA:  Chest pain on inspiration. EXAM: CHEST - 2 VIEW COMPARISON:   12/15/2018 FINDINGS: Cardiac shadow is again enlarged. Mild vascular congestion is noted without interstitial edema. The overall appearance is similar to that seen on the prior exam. Hyperinflation is noted. Degenerative change of thoracic spine is seen. IMPRESSION: Mild CHF similar to that seen on the previous day. Electronically Signed   By: Inez Catalina M.D.   On: 12/16/2018 22:55    Pending Labs [employer] (From admission, onward)    Start     Ordered  12/17/18 0139  SARS CORONAVIRUS 2 Nasal Swab Aptima Multi Swab  (Asymptomatic/Tier 2)  Once,   STAT    Question Answer Comment  Is this test for diagnosis or screening Screening   Symptomatic for COVID-19 as defined by CDC No   Hospitalized for COVID-19 No   Admitted to ICU for COVID-19 No   Previously tested for COVID-19 No   Resident in a congregate (group) care setting No   Employed in healthcare setting No      12/17/18 0138   12/17/18 0123  Brain natriuretic peptide  Add-on,   AD     12/17/18 0122          Vitals/Pain Today's Vitals   12/17/18 0041 12/17/18 0100 12/17/18 0130 12/17/18 0200  BP: 129/71 122/79 130/78 124/78  Pulse: (!) 110 92 (!) 42 (!) 102  Resp: (!) 26 (!) 26 (!) 22 (!) 25  Temp:      TempSrc:      SpO2: 96% 96% 92% 93%  PainSc:        Isolation Precautions No active isolations  Medications Medications  apixaban (ELIQUIS) tablet 5 mg (has no administration in time range)  sodium chloride flush (NS) 0.9 % injection 3 mL (3 mLs Intravenous Given 12/17/18 0133)  furosemide (LASIX) injection 40 mg (40 mg Intravenous Given 12/17/18 0132)  metoprolol tartrate (LOPRESSOR) injection 2.5 mg (2.5 mg Intravenous Given 12/17/18 0132)  heparin bolus via infusion 4,000 Units (4,000 Units Intravenous Bolus from Bag 12/17/18 0135)    Mobility walks High fall risk   Focused Assessments Cardiac Assessment Handoff:  Cardiac Rhythm: Atrial fibrillation Lab Results  Component Value Date   CKTOTAL 252 (H)  04/06/2011   CKMB 3.1 04/06/2011   TROPONINI <0.03 11/04/2016   Lab Results  Component Value Date   DDIMER 0.71 (H) 05/10/2014   Does the Patient currently have chest pain? No     R Recommendations: See Admitting Provider Note  Report given to:   Additional Notes:  Lives with son, daughter assists in care. Currently works as a Presenter, broadcasting.

## 2018-12-17 NOTE — Progress Notes (Signed)
MD, pt wears c-pap at night, will need an order for this Friday night, thanks [place]

## 2018-12-18 ENCOUNTER — Encounter (HOSPITAL_COMMUNITY): Payer: Self-pay

## 2018-12-18 DIAGNOSIS — R0789 Other chest pain: Secondary | ICD-10-CM

## 2018-12-18 DIAGNOSIS — R0609 Other forms of dyspnea: Secondary | ICD-10-CM

## 2018-12-18 DIAGNOSIS — I480 Paroxysmal atrial fibrillation: Principal | ICD-10-CM

## 2018-12-18 LAB — GLUCOSE, CAPILLARY
Glucose-Capillary: 91 mg/dL (ref 70–99)
Glucose-Capillary: 132 mg/dL — ABNORMAL HIGH (ref 70–99)
Glucose-Capillary: 194 mg/dL — ABNORMAL HIGH (ref 70–99)
Glucose-Capillary: 168 mg/dL — ABNORMAL HIGH (ref 70–99)

## 2018-12-18 MED ORDER — POTASSIUM CHLORIDE CRYS ER 20 MEQ PO TBCR
40.0000 meq | EXTENDED_RELEASE_TABLET | Freq: Once | ORAL | Status: AC
  Administered 2018-12-18: 40 meq via ORAL
  Filled 2018-12-18: qty 2

## 2018-12-18 MED ORDER — POTASSIUM CHLORIDE CRYS ER 20 MEQ PO TBCR: 20.0000 meq | EXTENDED_RELEASE_TABLET | Freq: Every day | ORAL | 1 refills | Status: DC

## 2018-12-18 MED ORDER — LOSARTAN POTASSIUM 50 MG PO TABS: 25.0000 mg | ORAL_TABLET | Freq: Every day | ORAL | Status: DC

## 2018-12-18 MED ORDER — NITROGLYCERIN 0.4 MG SL SUBL
SUBLINGUAL_TABLET | SUBLINGUAL | Status: AC
  Filled 2018-12-18: qty 1

## 2018-12-18 MED ORDER — SOTALOL HCL 80 MG PO TABS: 40.0000 mg | ORAL_TABLET | ORAL | 1 refills | Status: DC

## 2018-12-18 MED ORDER — SOTALOL HCL 80 MG PO TABS: 80.0000 mg | ORAL_TABLET | Freq: Every day | ORAL | Status: DC

## 2018-12-18 MED ORDER — APIXABAN 5 MG PO TABS: 5.0000 mg | ORAL_TABLET | Freq: Two times a day (BID) | ORAL | 3 refills | Status: DC

## 2018-12-18 MED ORDER — SOTALOL HCL 80 MG PO TABS
40.0000 mg | ORAL_TABLET | Freq: Every day | ORAL | Status: DC
  Administered 2018-12-19: 40 mg via ORAL
  Filled 2018-12-18: qty 1

## 2018-12-18 NOTE — Progress Notes (Addendum)
Subjective:  Patient about to be discharged this morning, was asymptomatic except for underlying mild dyspnea.  After I rounded on the patient, patient was made to ambulate and complained of severe chest pain EKG obtained and patient was put back into bed chest pain relieved with oxygen supplementation rest.  Intake/Output from previous day:  I/O last 3 completed shifts: In: 1080 [P.O.:1080] Out: 5247 [Urine:1730] Total I/O In: 240 [P.O.:240] Out: 425 [Urine:425]  Blood pressure 97/66, pulse 74, temperature 98.6 F (37 C), temperature source Oral, resp. rate 17, height 5' 10" (1.778 m), weight 114.8 kg, SpO2 98 %. Physical Exam  Constitutional: He appears well-developed. No distress.  Moderately  obese  HENT:  Head: Atraumatic.  Eyes: Conjunctivae are normal.  Neck: Neck supple. No thyromegaly present.  Short neck and difficult to evaluate JVP  Cardiovascular: Normal rate, regular rhythm and normal heart sounds. Exam reveals no gallop.  No murmur heard. Pulses:      Carotid pulses are 2+ on the right side and 2+ on the left side.      Dorsalis pedis pulses are 2+ on the right side and 2+ on the left side.       Posterior tibial pulses are 2+ on the right side and 2+ on the left side.  Femoral and popliteal pulse difficult to feel due to patient's body habitus.  Trace bilateral pitting edema  Pulmonary/Chest: Effort normal and breath sounds normal.  Abdominal: Soft. Bowel sounds are normal.  Obese. Pannus present  Musculoskeletal: Normal range of motion.        General: No edema.  Neurological: He is alert.  Skin: Skin is warm and dry.  Psychiatric: He has a normal mood and affect.   Lab Results: BMP BNP (last 3 results) Recent Labs    12/16/18 2236  BNP 226.9*    ProBNP (last 3 results) No results for input(s): PROBNP in the last 8760 hours. BMP Latest Ref Rng & Units 12/17/2018 12/16/2018 11/05/2016  Glucose 70 - 99 mg/dL 219(H) 333(H) 283(H)  BUN 8 - 23 mg/dL 10 10  21(H)  Creatinine 0.61 - 1.24 mg/dL 0.99 0.97 0.84  Sodium 135 - 145 mmol/L 136 135 140  Potassium 3.5 - 5.1 mmol/L 3.1(L) 3.6 4.2  Chloride 98 - 111 mmol/L 97(L) 96(L) 111  CO2 22 - 32 mmol/L _0 Calcium 8.9 - 10.3 mg/dL 9.8 9.9 9.4   Hepatic Function Latest Ref Rng & Units 12/17/2018 11/04/2016 05/11/2014  Total Protein 6.5 - 8.1 g/dL 8.2(H) 6.6 7.1  Albumin 3.5 - 5.0 g/dL 3.7 3.6 3.8  AST 15 - 41 U/L 24 33 37  ALT 0 - 44 U/L 27 24 36  Alk Phosphatase 38 - 126 U/L 112 92 99  Total Bilirubin 0.3 - 1.2 mg/dL 2.0(H) 0.8 1.0  Bilirubin, Direct 0.1 - 0.5 mg/dL - 0.2 -   CBC Latest Ref Rng & Units 12/16/2018 11/05/2016 11/04/2016  WBC 4.0 - 10.5 K/uL 8.2 10.6(H) 10.5  Hemoglobin 13.0 - 17.0 g/dL 14.1 11.8(L) 12.2(L)  Hematocrit 39.0 - 52.0 % 40.4 36.0(L) 36.3(L)  Platelets 150 - 400 K/uL 194 224 211   Lipid Panel     Component Value Date/Time   CHOL 142 12/17/2018 0420   TRIG 109 12/17/2018 0420   HDL 59 12/17/2018 0420   CHOLHDL 2.4 12/17/2018 0420   VLDL 22 12/17/2018 0420   LDLCALC 61 12/17/2018 0420   Cardiac Panel (last 3 results) No results for input(s): CKTOTAL, CKMB, TROPONINI, RELINDX  in the last 72 hours.  HEMOGLOBIN A1C Lab Results  Component Value Date   HGBA1C 6.4 (H) 12/17/2018   MPG 136.98 12/17/2018   TSH Recent Labs    12/17/18 0420  TSH 3.819   Imaging: Dg Chest 2 View  Result Date: 12/16/2018 CLINICAL DATA:  Chest pain on inspiration. EXAM: CHEST - 2 VIEW COMPARISON:  12/15/2018 FINDINGS: Cardiac shadow is again enlarged. Mild vascular congestion is noted without interstitial edema. The overall appearance is similar to that seen on the prior exam. Hyperinflation is noted. Degenerative change of thoracic spine is seen. IMPRESSION: Mild CHF similar to that seen on the previous day. Electronically Signed   By: Inez Catalina M.D.   On: 12/16/2018 22:55    No results found for this or any previous visit (from the past 43800 hour(s)).  Scheduled Meds:  .  apixaban  5 mg Oral Q12H  . diltiazem  30 mg Oral Q6H  . DULoxetine  60 mg Oral Daily  . furosemide  40 mg Oral BID  . insulin aspart  0-20 Units Subcutaneous TID WC  . insulin aspart  0-5 Units Subcutaneous QHS  . insulin glargine  75 Units Subcutaneous BID  . latanoprost  1 drop Both Eyes QHS  . levothyroxine  100 mcg Oral Q0600  . linagliptin  5 mg Oral Daily  . losartan  25 mg Oral QPM  . metFORMIN  1,000 mg Oral BID WC  . nitroGLYCERIN      . potassium chloride  20 mEq Oral Daily  . pravastatin  40 mg Oral q1800  . sotalol  80 mg Oral Q12H   Continuous Infusions: PRN Meds:.acetaminophen, ALPRAZolam, ondansetron (ZOFRAN) IV  Cardiac studies:  Coronary angiogram 2001 and 2016: Normal LVEF, Mid LAD 30-40% stenosis, no change.   Echocardiogram 05/14/2014 :  Normal LV systolic function, grade 1 diastolic dysfunction, EF 16-10%.  Calcified mitral annulus, no other significant abnormality.  Assessment:   1.  New onset paroxysmal atrial fibrillation with rapid ventricular response CHA2DS2-VASc Score is 3.  Yearly risk of stroke: 3.2%.  Score of 1=1.3; 2=2.2; 3=3.2; 4=4; 5=6.7; 6=9.8; 7=>9.8) -(CHF; HTN; vasc disease DM,  Male = 1; Age <65 =0; 65-74 = 1,  >75 =2; stroke = 2).    EKG 12/16/2018: Sinus tachycardia cardiac rate of 104 bpm, normal axis, poor R-wave progression, cannot exclude anterior infarct old.  PQT interval.  No evidence of ischemia.  No significant change from 11/03/2016. Repeat EKG a few minutes later atrial fibrillation with rapid ventricular response at the rate of 40 bpm, poor R-wave progression, nonspecific T abnormality.  EKG 12/18/2018 with chest pain: Normal sinus rhythm at rate of 70 bpm, normal axis, prolonged QT at 506 ms.  No significant change from prior EKG.  2.  Acute on chronic diastolic heart failure 3.  Atypical chest pain, has musculoskeletal component but has high risk factors including DM and obesity.  4.  Shortness of breath and dyspnea on  exertion 5.  Was a on CPAP, will order CPAP today.  Plan:   Patient with chest discomfort although appeared to be muscular skeletal in the beginning, due to his underlying cardiovascular risk factors although cardiac markers are negative for myocardial injury, he will need cardiac catheterization for definitive diagnosis of progression of disease as he already has established moderate proximal mid LAD stenosis by prior cardiac catheterization.  EKG does not reveal any acute ischemic changes, QT interval is slightly prolonged, sotalol dose has been reduced from  80 mg b.i.d. to 40 mg BID.  Will follow-up on the QT interval.  No arrhythmias noted on the telemetry except for rare PVCs. Dyspnea is improved although with exertional activity he was dyspneic and had chest discomfort needing oxygen supplementation.  We'll cancel his discharge and observe for today, I'll obtain an echocardiogram as well.  Adrian Prows, M.D. 12/18/2018, 3:23 PM [facility], [place] Pager: [phone] Office: [phone] If no answer: [phone]

## 2018-12-18 NOTE — Discharge Summary (Signed)
Physician Discharge Summary  Patient ID: Joshua Lara MRN: [id number] DOB/AGE: Aug 10, 1945 73 y.o.  Admit date: 12/16/2018 Discharge date: 12/20/2018  Primary Discharge Diagnosis Paroxysmal atrial fibrillation withRVR CHA2DS2-VASc Score is 3.  Yearly risk of stroke: 3.2%.  Score of 1=1.3; 2=2.2; 3=3.2; 4=4; 5=6.7; 6=9.8; 7=>9.8) -(CHF; HTN; vasc disease DM,  Male = 1; Age <65 =0; 65-74 = 1,  >75 =2; stroke = 2).    Secondary Discharge Diagnosis 2.  Acute on chronic diastolic heart failure 3.  OSA on CPAP 4.  Controlled diabetes mellitus with hyperglycemia without complications 5.  Musculoskeletal chest pain  Significant Diagnostic Studies:  EKG 12/19/2018: Sinus rhythm with first-degree AV block at rate of 80 bpm, right axis.  Poor R-wave progression, cannot exclude anteroseptal infarct old.  QT interval minimally prolonged at 490 ms.  Improved from yesterday.  EKG G08/01/2019: Sinus rhythm with first-degree AV block at rate of 82 bpm, rightward axis, incomplete right bundle branch block.  Poor R progression, pulmonary disease pattern.  QTc 502.  No significant change from 12/17/2018  Coronary angiogram  12/20/2018: Minimal luminal irregularity without significant coronary artery disease.  Mildly dilated LV, mildly reduced LVEF at 45%, normal EDP.  Echocardiogram 12/19/2018:   The left ventricle has mildly reduced systolic function, with an ejection fraction of 45-50%. The cavity size was mildly dilated. Indeterminate diastolic filling due to E-A fusion. E/e" suggests elevated LA and LVEDP Left ventrical global hypokinesis without regional wall motion abnormalities. Left atrial size was moderately dilated. Right atrial size was mildly dilated. Mild thickening of the mitral valve leaflet. Mild calcification of the mitral valve leaflet.The aortic valve was not well visualized. Mild calcification of the aortic valve. Right ventricle is not well visualized but appears mildly dilated with  normal function. IVC is dilated with blunted respiratory response, suggesting elevated central venous pressure.  Compared to 05/14/2014 :  Normal LV systolic function, grade 1 diastolic dysfunction, EF 57-84%.   Hospital Course: Patient admitted with dyspnea, chest pain, while in the emergency room patient went into sustained atrial fibrillation and eventually spontaneously converted to sinus rhythm.  He was admitted for further evaluation and management of acute diastolic heart failure and atypical chest pain and atrial fibrillation.  He was initiated on sotalol, was able to be discharged but then developed exertional chest discomfort and marked dyspnea, hence he was kept back in the hospital and underwent coronary angiography as previously was told to have a 40 to 50% mid LAD stenosis.  In view of his cardiac risk factors including diabetes, hypertension and hyperlipidemia and morbid obesity, cardiac catheterization was performed.  Coronary geography with no significant disease.  He was felt stable for discharge.  Physical Exam  Constitutional: He appears well-developed. No distress.  Moderately obese  HENT:  Head: Atraumatic.  Eyes: Conjunctivae are normal.  Neck: Neck supple. No JVD present. No thyromegaly present.  Cardiovascular: Normal rate, regular rhythm, normal heart sounds and intact distal pulses. Exam reveals no gallop.  No murmur heard. Pulmonary/Chest: Effort normal and breath sounds normal.  Abdominal: Soft. Bowel sounds are normal.  Musculoskeletal: Normal range of motion.  Neurological: He is alert.  Skin: Skin is warm and dry.  Psychiatric: He has a normal mood and affect.    Recommendations on discharge:   Patient will start Eliquis 1 p.o. twice daily for paroxysmal atrial fibrillation.  Weight loss was discussed with the patient.  He will also be discharged home on furosemide 40 mg daily.  Labs:  Lab Results  Component Value Date   WBC 8.2 12/16/2018   HGB 14.1  12/16/2018   HCT 40.4 12/16/2018   MCV 85.6 12/16/2018   PLT 194 12/16/2018    Recent Labs  Lab 12/17/18 0420 12/19/18 1047  NA 136 136  K 3.1* 3.8  CL 97* 98  CO2 27 26  BUN 10 20  CREATININE 0.99 1.01  CALCIUM 9.8 9.9  PROT 8.2*  --   BILITOT 2.0*  --   ALKPHOS 112  --   ALT 27  --   AST 24  --   GLUCOSE 219* 144*   Lipid Panel     Component Value Date/Time   CHOL 142 12/17/2018 0420   TRIG 109 12/17/2018 0420   HDL 59 12/17/2018 0420   CHOLHDL 2.4 12/17/2018 0420   VLDL 22 12/17/2018 0420   LDLCALC 61 12/17/2018 0420    BNP (last 3 results) Recent Labs    12/16/18 2236  BNP 226.9*    HEMOGLOBIN A1C Lab Results  Component Value Date   HGBA1C 6.4 (H) 12/17/2018   MPG 136.98 12/17/2018    Cardiac Panel (last 3 results) No results for input(s): CKTOTAL, CKMB, TROPONINI, RELINDX in the last 8760 hours.  Lab Results  Component Value Date   CKTOTAL 252 (H) 04/06/2011   CKMB 3.1 04/06/2011   TROPONINI <0.03 11/04/2016     TSH Recent Labs    12/17/18 0420  TSH 3.819   Radiology: Dg Chest 2 View  Result Date: 12/16/2018 CLINICAL DATA:  Chest pain on inspiration. EXAM: CHEST - 2 VIEW COMPARISON:  12/15/2018 FINDINGS: Cardiac shadow is again enlarged. Mild vascular congestion is noted without interstitial edema. The overall appearance is similar to that seen on the prior exam. Hyperinflation is noted. Degenerative change of thoracic spine is seen. IMPRESSION: Mild CHF similar to that seen on the previous day. Electronically Signed   By: Inez Catalina M.D.   On: 12/16/2018 22:55   FOLLOW UP PLANS AND APPOINTMENTS  Allergies as of 12/20/2018      Reactions   Bactrim [sulfamethoxazole-trimethoprim] Swelling   Zantac [ranitidine Hcl] Anaphylaxis   Has anaphylactic shock--intubated and in ICU x 4 days    Etanercept Other (See Comments), Cough   (ENBREL); Patient thinks it contributed to heart issues   Etanercept    Other reaction(s): Cough  (ALLERGY/intolerance), Other (See Comments) (ENBREL); Patient thinks it contributed to heart issues   Tape Itching   Can only tolerate bandages for less than 24-hr periods Can only tolerate bandages for less than 24-hr periods      Medication List    STOP taking these medications   aspirin EC 81 MG tablet   baclofen 10 MG tablet Commonly known as: LIORESAL   ibuprofen 800 MG tablet Commonly known as: ADVIL   lidocaine 5 % Commonly known as: LIDODERM   metoprolol succinate 25 MG 24 hr tablet Commonly known as: TOPROL-XL   potassium chloride 10 MEQ CR capsule Commonly known as: MICRO-K   traMADol 50 MG tablet Commonly known as: Ultram     TAKE these medications   apixaban 5 MG Tabs tablet Commonly known as: ELIQUIS Take 1 tablet (5 mg total) by mouth every 12 (twelve) hours.   BASAGLAR KWIKPEN Saunders Inject 150 Units into the skin 2 (two) times daily.   bimatoprost 0.01 % Soln Commonly known as: LUMIGAN Place 1 drop into both eyes at bedtime.   DULoxetine 60 MG capsule Commonly known as: CYMBALTA Take  60 mg by mouth daily.   furosemide 40 MG tablet Commonly known as: LASIX Take 1 tablet (40 mg total) by mouth daily.   furosemide 40 MG tablet Commonly known as: LASIX Take 1 tablet (40 mg total) by mouth daily.   levothyroxine 100 MCG tablet Commonly known as: SYNTHROID Take 100 mcg by mouth daily.   losartan 50 MG tablet Commonly known as: COZAAR Take 0.5 tablets (25 mg total) by mouth daily. What changed: how much to take   magnesium oxide 400 MG tablet Commonly known as: MAG-OX Take 400 mg by mouth daily.   nitroGLYCERIN 0.4 MG SL tablet Commonly known as: NITROSTAT Place 0.4 mg under the tongue every 5 (five) minutes as needed for chest pain.   potassium chloride SA 20 MEQ tablet Commonly known as: K-DUR Take 1 tablet (20 mEq total) by mouth daily. With Furosemide   pravastatin 40 MG tablet Commonly known as: PRAVACHOL Take 40 mg by mouth  daily.   sitaGLIPtin-metformin 50-1000 MG tablet Commonly known as: JANUMET Take 1 tablet by mouth 2 (two) times daily with a meal. Start taking on: December 22, 2018 What changed: These instructions start on December 22, 2018. If you are unsure what to do until then, ask your doctor or other care provider.   sotalol 80 MG tablet Commonly known as: BETAPACE Take 0.5 tablets (40 mg total) by mouth every morning. 1 tablet in the evening   vitamin C 500 MG tablet Commonly known as: ASCORBIC ACID Take 500 mg by mouth daily.   Vitamin D3 50 MCG (2000 UT) capsule Take 2,000 Units by mouth daily.         Adrian Prows, MD 12/20/2018, 8:10 AM  Pager: [phone] Office: [phone] If no answer: [phone]

## 2018-12-18 NOTE — Progress Notes (Signed)
Patient started having chest discomfort/tightness after walking to restroom.   I had removed O2 to do a walking ambulation.   Paged MD  O2 sat prior to replacing oxygen was 95%.   V.S and a EKG obtained.   Placed back on O2  MD returned call- will cancel discharge at this time.   Will continue to monitor

## 2018-12-18 NOTE — Progress Notes (Signed)
Daughter would like to be updated with plan of care.   Patient is very drowsy today and was not able to fully explain what was going on.   Contact number listed on Summary page.

## 2018-12-18 NOTE — Progress Notes (Signed)
Discharge paperwork completed for primary nurse.

## 2018-12-19 ENCOUNTER — Inpatient Hospital Stay (HOSPITAL_COMMUNITY): Payer: Medicare Other

## 2018-12-19 DIAGNOSIS — I420 Dilated cardiomyopathy: Secondary | ICD-10-CM

## 2018-12-19 LAB — BASIC METABOLIC PANEL
Anion gap: 12 (ref 5–15)
BUN: 20 mg/dL (ref 8–23)
CO2: 26 mmol/L (ref 22–32)
Calcium: 9.9 mg/dL (ref 8.9–10.3)
Chloride: 98 mmol/L (ref 98–111)
Creatinine, Ser: 1.01 mg/dL (ref 0.61–1.24)
GFR calc Af Amer: >60 mL/min (ref >60)
GFR calc non Af Amer: >60 mL/min (ref >60)
Glucose, Bld: 144 mg/dL — ABNORMAL HIGH (ref 70–99)
Potassium: 3.8 mmol/L (ref 3.5–5.1)
Sodium: 136 mmol/L (ref 135–145)

## 2018-12-19 LAB — ECHOCARDIOGRAM COMPLETE
Height: 70 in
Weight: 4024.01 oz

## 2018-12-19 LAB — GLUCOSE, CAPILLARY
Glucose-Capillary: 74 mg/dL (ref 70–99)
Glucose-Capillary: 140 mg/dL — ABNORMAL HIGH (ref 70–99)
Glucose-Capillary: 152 mg/dL — ABNORMAL HIGH (ref 70–99)
Glucose-Capillary: 202 mg/dL — ABNORMAL HIGH (ref 70–99)

## 2018-12-19 LAB — MAGNESIUM: Magnesium: 2 mg/dL (ref 1.7–2.4)

## 2018-12-19 MED ORDER — SODIUM CHLORIDE 0.9 % WEIGHT BASED INFUSION
1.0000 mL/kg/h | INTRAVENOUS | Status: DC
  Administered 2018-12-20: 1 mL/kg/h via INTRAVENOUS

## 2018-12-19 MED ORDER — SODIUM CHLORIDE 0.9% FLUSH
3.0000 mL | Freq: Two times a day (BID) | INTRAVENOUS | Status: DC
  Administered 2018-12-19 (×2): 3 mL via INTRAVENOUS

## 2018-12-19 MED ORDER — SOTALOL HCL 80 MG PO TABS
40.0000 mg | ORAL_TABLET | Freq: Two times a day (BID) | ORAL | Status: DC
  Administered 2018-12-19 – 2018-12-20 (×2): 40 mg via ORAL
  Filled 2018-12-19 (×2): qty 1

## 2018-12-19 MED ORDER — SODIUM CHLORIDE 0.9 % WEIGHT BASED INFUSION
3.0000 mL/kg/h | INTRAVENOUS | Status: DC
  Administered 2018-12-20: 3 mL/kg/h via INTRAVENOUS

## 2018-12-19 MED ORDER — FUROSEMIDE 40 MG PO TABS
40.0000 mg | ORAL_TABLET | Freq: Every day | ORAL | Status: DC
  Administered 2018-12-20: 40 mg via ORAL
  Filled 2018-12-19: qty 1

## 2018-12-19 MED ORDER — SODIUM CHLORIDE 0.9 % IV SOLN: 250.0000 mL | INTRAVENOUS | Status: DC | PRN

## 2018-12-19 MED ORDER — SODIUM CHLORIDE 0.9% FLUSH: 3.0000 mL | INTRAVENOUS | Status: DC | PRN

## 2018-12-19 MED ORDER — ASPIRIN 81 MG PO CHEW
81.0000 mg | CHEWABLE_TABLET | ORAL | Status: AC
  Administered 2018-12-20: 81 mg via ORAL
  Filled 2018-12-19: qty 1

## 2018-12-19 NOTE — Progress Notes (Signed)
Patient has small amount of redness at top of IV site-  Patient skin seems to react to different material.   Flushed IV, nonpainful, flushed well.  Will continue to monitor closely

## 2018-12-19 NOTE — Progress Notes (Signed)
Subjective:  No further chest pain or dyspnea. Feeling better  Intake/Output from previous day:  I/O last 3 completed shifts: In: 1540 [P.O.:1540] Out: 1749 [Urine:3340] Total I/O In: 240 [P.O.:240] Out: -   Blood pressure 104/67, pulse 84, temperature 99 F (37.2 C), temperature source Oral, resp. rate 18, height '5\' 10"'$  (1.778 m), weight 114.1 kg, SpO2 97 %. Physical Exam  Constitutional: He appears well-developed. No distress.  Moderately  obese  HENT:  Head: Atraumatic.  Eyes: Conjunctivae are normal.  Neck: Neck supple. No thyromegaly present.  Short neck and difficult to evaluate JVP  Cardiovascular: Normal rate, regular rhythm and normal heart sounds. Exam reveals no gallop.  No murmur heard. Pulses:      Carotid pulses are 2+ on the right side and 2+ on the left side.      Dorsalis pedis pulses are 2+ on the right side and 2+ on the left side.       Posterior tibial pulses are 2+ on the right side and 2+ on the left side.  Femoral and popliteal pulse difficult to feel due to patient's body habitus.  Trace bilateral pitting edema  Pulmonary/Chest: Effort normal and breath sounds normal.  Abdominal: Soft. Bowel sounds are normal.  Obese. Pannus present  Musculoskeletal: Normal range of motion.        General: No edema.  Neurological: He is alert.  Skin: Skin is warm and dry.  Psychiatric: He has a normal mood and affect.   Lab Results: BMP BNP (last 3 results) Recent Labs    12/16/18 2236  BNP 226.9*    ProBNP (last 3 results) No results for input(s): PROBNP in the last 8760 hours. BMP Latest Ref Rng & Units 12/17/2018 12/16/2018 11/05/2016  Glucose 70 - 99 mg/dL 219(H) 333(H) 283(H)  BUN 8 - 23 mg/dL 10 10 21(H)  Creatinine 0.61 - 1.24 mg/dL 0.99 0.97 0.84  Sodium 135 - 145 mmol/L 136 135 140  Potassium 3.5 - 5.1 mmol/L 3.1(L) 3.6 4.2  Chloride 98 - 111 mmol/L 97(L) 96(L) 111  CO2 22 - 32 mmol/L '27 27 25  '$ Calcium 8.9 - 10.3 mg/dL 9.8 9.9 9.4   Hepatic  Function Latest Ref Rng & Units 12/17/2018 11/04/2016 05/11/2014  Total Protein 6.5 - 8.1 g/dL 8.2(H) 6.6 7.1  Albumin 3.5 - 5.0 g/dL 3.7 3.6 3.8  AST 15 - 41 U/L 24 33 37  ALT 0 - 44 U/L 27 24 36  Alk Phosphatase 38 - 126 U/L 112 92 99  Total Bilirubin 0.3 - 1.2 mg/dL 2.0(H) 0.8 1.0  Bilirubin, Direct 0.1 - 0.5 mg/dL - 0.2 -   CBC Latest Ref Rng & Units 12/16/2018 11/05/2016 11/04/2016  WBC 4.0 - 10.5 K/uL 8.2 10.6(H) 10.5  Hemoglobin 13.0 - 17.0 g/dL 14.1 11.8(L) 12.2(L)  Hematocrit 39.0 - 52.0 % 40.4 36.0(L) 36.3(L)  Platelets 150 - 400 K/uL 194 224 211   Lipid Panel     Component Value Date/Time   CHOL 142 12/17/2018 0420   TRIG 109 12/17/2018 0420   HDL 59 12/17/2018 0420   CHOLHDL 2.4 12/17/2018 0420   VLDL 22 12/17/2018 0420   LDLCALC 61 12/17/2018 0420   Cardiac Panel (last 3 results) No results for input(s): CKTOTAL, CKMB, TROPONINI, RELINDX in the last 72 hours.  HEMOGLOBIN A1C Lab Results  Component Value Date   HGBA1C 6.4 (H) 12/17/2018   MPG 136.98 12/17/2018   TSH Recent Labs    12/17/18 0420  TSH 3.819  Imaging: No results found.  Recent Results (from the past 43800 hour(s))  ECHOCARDIOGRAM COMPLETE   Collection Time: 12/19/18 10:21 AM  Result Value   Weight 4,024.01   Height 70   BP 104/67   *Note: Due to a large number of results and/or encounters for the requested time period, some results have not been displayed. A complete set of results can be found in Results Review.    Scheduled Meds:  . DULoxetine  60 mg Oral Daily  . [START ON 12/20/2018] furosemide  40 mg Oral Daily  . insulin aspart  0-20 Units Subcutaneous TID WC  . insulin aspart  0-5 Units Subcutaneous QHS  . insulin glargine  75 Units Subcutaneous BID  . latanoprost  1 drop Both Eyes QHS  . levothyroxine  100 mcg Oral Q0600  . linagliptin  5 mg Oral Daily  . potassium chloride  20 mEq Oral Daily  . pravastatin  40 mg Oral q1800  . sodium chloride flush  3 mL Intravenous Q12H  .  sotalol  40 mg Oral Q12H   Continuous Infusions: PRN Meds:.acetaminophen, ALPRAZolam, ondansetron (ZOFRAN) IV  Cardiac studies:  Coronary angiogram 2001 and 2016: Normal LVEF, Mid LAD 30-40% stenosis, no change.   Echocardiogram 12/19/2018:   The left ventricle has mildly reduced systolic function, with an ejection fraction of 45-50%. The cavity size was mildly dilated. Indeterminate diastolic filling due to E-A fusion. E/e" suggests elevated LA and LVEDP Left ventrical global hypokinesis without regional wall motion abnormalities. Left atrial size was moderately dilated. Right atrial size was mildly dilated. Mild thickening of the mitral valve leaflet. Mild calcification of the mitral valve leaflet.The aortic valve was not well visualized. Mild calcification of the aortic valve. Right ventricle is not well visualized but appears mildly dilated with normal function. IVC is dilated with blunted respiratory response, suggesting elevated central venous pressure.  Compared to 05/14/2014 :  Normal LV systolic function, grade 1 diastolic dysfunction, EF 22-63%.   Assessment:   1.  New onset paroxysmal atrial fibrillation with rapid ventricular response CHA2DS2-VASc Score is 3.  Yearly risk of stroke: 3.2%.  Score of 1=1.3; 2=2.2; 3=3.2; 4=4; 5=6.7; 6=9.8; 7=>9.8) -(CHF; HTN; vasc disease DM,  Male = 1; Age <65 =0; 65-74 = 1,  >75 =2; stroke = 2).    Tele: NSR EKG 12/19/2018: Sinus rhythm with first-degree AV block at rate of 80 bpm, right axis.  Poor R-wave progression, cannot exclude anteroseptal infarct old.  QT interval minimally prolonged at 490 ms.  Improved from yesterday.  EKG 12/16/2018: Sinus tachycardia cardiac rate of 104 bpm, normal axis, poor R-wave progression, cannot exclude anterior infarct old.  PQT interval.  No evidence of ischemia.  No significant change from 11/03/2016. Repeat EKG a few minutes later atrial fibrillation with rapid ventricular response at the rate of 40 bpm,  poor R-wave progression, nonspecific T abnormality.  EKG 12/18/2018 with chest pain: Normal sinus rhythm at rate of 70 bpm, normal axis, prolonged QT at 506 ms.  No significant change from prior EKG.  2.  Acute on chronic diastolic heart failure, echo reviewed. LV mildly dilated and mild reduction in LVEF.  3.  Atypical chest pain, has musculoskeletal component but has high risk factors including DM and obesity.  4.  Shortness of breath and dyspnea on exertion 5. Dilated cardiomyopathy.  Plan:   Patient with chest discomfort although appeared to be muscular skeletal in the beginning, due to his underlying cardiovascular risk factors although cardiac markers  are negative for myocardial injury, he will need cardiac catheterization for definitive diagnosis of progression of disease as he already has established moderate proximal mid LAD stenosis by prior cardiac catheterization. The EF is also reduced, ? A. Fib related.   EKG does not reveal any acute ischemic changes, QT interval is slightly prolonged but improved on reducing Sotalol dose to 40 mg BID. No arrhythmias noted on the telemetry except for rare PVCs. Dyspnea is improved although with exertional activity he was dyspneic and had chest discomfort needing oxygen supplementation.  We'll cancel his discharge and observe for today, I'll obtain an echocardiogram as well.  Discussed risks, benefits and alternatives of angiogram including but not limited to <1% risk of death, stroke, MI, need for urgent surgical revascularization, renal failure, but not limited to thest. patient is willing to proceed.  His right radial artery is not very prominent and he has had radial cath previously, ? Occluded.  Adrian Prows, M.D. 12/19/2018, 10:26 AM [facility] Pager: [phone] Office: [phone] If no answer: [phone]

## 2018-12-19 NOTE — H&P (View-Only) (Signed)
Subjective:  No further chest pain or dyspnea. Feeling better  Intake/Output from previous day:  I/O last 3 completed shifts: In: 1540 [P.O.:1540] Out: 5681 [Urine:3340] Total I/O In: 240 [P.O.:240] Out: -   Blood pressure 104/67, pulse 84, temperature 99 F (37.2 C), temperature source Oral, resp. rate 18, height '5\' 10"'$  (1.778 m), weight 114.1 kg, SpO2 97 %. Physical Exam  Constitutional: He appears well-developed. No distress.  Moderately  obese  HENT:  Head: Atraumatic.  Eyes: Conjunctivae are normal.  Neck: Neck supple. No thyromegaly present.  Short neck and difficult to evaluate JVP  Cardiovascular: Normal rate, regular rhythm and normal heart sounds. Exam reveals no gallop.  No murmur heard. Pulses:      Carotid pulses are 2+ on the right side and 2+ on the left side.      Dorsalis pedis pulses are 2+ on the right side and 2+ on the left side.       Posterior tibial pulses are 2+ on the right side and 2+ on the left side.  Femoral and popliteal pulse difficult to feel due to patient's body habitus.  Trace bilateral pitting edema  Pulmonary/Chest: Effort normal and breath sounds normal.  Abdominal: Soft. Bowel sounds are normal.  Obese. Pannus present  Musculoskeletal: Normal range of motion.        General: No edema.  Neurological: He is alert.  Skin: Skin is warm and dry.  Psychiatric: He has a normal mood and affect.   Lab Results: BMP BNP (last 3 results) Recent Labs    12/16/18 2236  BNP 226.9*    ProBNP (last 3 results) No results for input(s): PROBNP in the last 8760 hours. BMP Latest Ref Rng & Units 12/17/2018 12/16/2018 11/05/2016  Glucose 70 - 99 mg/dL 219(H) 333(H) 283(H)  BUN 8 - 23 mg/dL 10 10 21(H)  Creatinine 0.61 - 1.24 mg/dL 0.99 0.97 0.84  Sodium 135 - 145 mmol/L 136 135 140  Potassium 3.5 - 5.1 mmol/L 3.1(L) 3.6 4.2  Chloride 98 - 111 mmol/L 97(L) 96(L) 111  CO2 22 - 32 mmol/L '27 27 25  '$ Calcium 8.9 - 10.3 mg/dL 9.8 9.9 9.4   Hepatic  Function Latest Ref Rng & Units 12/17/2018 11/04/2016 05/11/2014  Total Protein 6.5 - 8.1 g/dL 8.2(H) 6.6 7.1  Albumin 3.5 - 5.0 g/dL 3.7 3.6 3.8  AST 15 - 41 U/L 24 33 37  ALT 0 - 44 U/L 27 24 36  Alk Phosphatase 38 - 126 U/L 112 92 99  Total Bilirubin 0.3 - 1.2 mg/dL 2.0(H) 0.8 1.0  Bilirubin, Direct 0.1 - 0.5 mg/dL - 0.2 -   CBC Latest Ref Rng & Units 12/16/2018 11/05/2016 11/04/2016  WBC 4.0 - 10.5 K/uL 8.2 10.6(H) 10.5  Hemoglobin 13.0 - 17.0 g/dL 14.1 11.8(L) 12.2(L)  Hematocrit 39.0 - 52.0 % 40.4 36.0(L) 36.3(L)  Platelets 150 - 400 K/uL 194 224 211   Lipid Panel     Component Value Date/Time   CHOL 142 12/17/2018 0420   TRIG 109 12/17/2018 0420   HDL 59 12/17/2018 0420   CHOLHDL 2.4 12/17/2018 0420   VLDL 22 12/17/2018 0420   LDLCALC 61 12/17/2018 0420   Cardiac Panel (last 3 results) No results for input(s): CKTOTAL, CKMB, TROPONINI, RELINDX in the last 72 hours.  HEMOGLOBIN A1C Lab Results  Component Value Date   HGBA1C 6.4 (H) 12/17/2018   MPG 136.98 12/17/2018   TSH Recent Labs    12/17/18 0420  TSH 3.819  Imaging: No results found.  Recent Results (from the past 43800 hour(s))  ECHOCARDIOGRAM COMPLETE   Collection Time: 12/19/18 10:21 AM  Result Value   Weight 4,024.01   Height 70   BP 104/67   *Note: Due to a large number of results and/or encounters for the requested time period, some results have not been displayed. A complete set of results can be found in Results Review.    Scheduled Meds:  . DULoxetine  60 mg Oral Daily  . [START ON 12/20/2018] furosemide  40 mg Oral Daily  . insulin aspart  0-20 Units Subcutaneous TID WC  . insulin aspart  0-5 Units Subcutaneous QHS  . insulin glargine  75 Units Subcutaneous BID  . latanoprost  1 drop Both Eyes QHS  . levothyroxine  100 mcg Oral Q0600  . linagliptin  5 mg Oral Daily  . potassium chloride  20 mEq Oral Daily  . pravastatin  40 mg Oral q1800  . sodium chloride flush  3 mL Intravenous Q12H  .  sotalol  40 mg Oral Q12H   Continuous Infusions: PRN Meds:.acetaminophen, ALPRAZolam, ondansetron (ZOFRAN) IV  Cardiac studies:  Coronary angiogram 2001 and 2016: Normal LVEF, Mid LAD 30-40% stenosis, no change.   Echocardiogram 12/19/2018:   The left ventricle has mildly reduced systolic function, with an ejection fraction of 45-50%. The cavity size was mildly dilated. Indeterminate diastolic filling due to E-A fusion. E/e" suggests elevated LA and LVEDP Left ventrical global hypokinesis without regional wall motion abnormalities. Left atrial size was moderately dilated. Right atrial size was mildly dilated. Mild thickening of the mitral valve leaflet. Mild calcification of the mitral valve leaflet.The aortic valve was not well visualized. Mild calcification of the aortic valve. Right ventricle is not well visualized but appears mildly dilated with normal function. IVC is dilated with blunted respiratory response, suggesting elevated central venous pressure.  Compared to 05/14/2014 :  Normal LV systolic function, grade 1 diastolic dysfunction, EF 41-66%.   Assessment:   1.  New onset paroxysmal atrial fibrillation with rapid ventricular response CHA2DS2-VASc Score is 3.  Yearly risk of stroke: 3.2%.  Score of 1=1.3; 2=2.2; 3=3.2; 4=4; 5=6.7; 6=9.8; 7=>9.8) -(CHF; HTN; vasc disease DM,  Male = 1; Age <65 =0; 65-74 = 1,  >75 =2; stroke = 2).    Tele: NSR EKG 12/19/2018: Sinus rhythm with first-degree AV block at rate of 80 bpm, right axis.  Poor R-wave progression, cannot exclude anteroseptal infarct old.  QT interval minimally prolonged at 490 ms.  Improved from yesterday.  EKG 12/16/2018: Sinus tachycardia cardiac rate of 104 bpm, normal axis, poor R-wave progression, cannot exclude anterior infarct old.  PQT interval.  No evidence of ischemia.  No significant change from 11/03/2016. Repeat EKG a few minutes later atrial fibrillation with rapid ventricular response at the rate of 40 bpm,  poor R-wave progression, nonspecific T abnormality.  EKG 12/18/2018 with chest pain: Normal sinus rhythm at rate of 70 bpm, normal axis, prolonged QT at 506 ms.  No significant change from prior EKG.  2.  Acute on chronic diastolic heart failure, echo reviewed. LV mildly dilated and mild reduction in LVEF.  3.  Atypical chest pain, has musculoskeletal component but has high risk factors including DM and obesity.  4.  Shortness of breath and dyspnea on exertion 5. Dilated cardiomyopathy.  Plan:   Patient with chest discomfort although appeared to be muscular skeletal in the beginning, due to his underlying cardiovascular risk factors although cardiac markers  are negative for myocardial injury, he will need cardiac catheterization for definitive diagnosis of progression of disease as he already has established moderate proximal mid LAD stenosis by prior cardiac catheterization. The EF is also reduced, ? A. Fib related.   EKG does not reveal any acute ischemic changes, QT interval is slightly prolonged but improved on reducing Sotalol dose to 40 mg BID. No arrhythmias noted on the telemetry except for rare PVCs. Dyspnea is improved although with exertional activity he was dyspneic and had chest discomfort needing oxygen supplementation.  We'll cancel his discharge and observe for today, I'll obtain an echocardiogram as well.  Discussed risks, benefits and alternatives of angiogram including but not limited to <1% risk of death, stroke, MI, need for urgent surgical revascularization, renal failure, but not limited to thest. patient is willing to proceed.  His right radial artery is not very prominent and he has had radial cath previously, ? Occluded.  Adrian Prows, M.D. 12/19/2018, 10:26 AM [facility] Pager: [phone] Office: [phone] If no answer: [phone]

## 2018-12-19 NOTE — Progress Notes (Signed)
Holding metformin this morning- Patient possibly going for a Cardiac Cath tomorrow

## 2018-12-20 ENCOUNTER — Encounter (HOSPITAL_COMMUNITY): Payer: Self-pay | Admitting: Cardiology

## 2018-12-20 ENCOUNTER — Encounter (HOSPITAL_COMMUNITY)
Admission: EM | Disposition: A | Discharge status: Home / Self Care | Payer: Self-pay | Source: Home / Self Care | Attending: Cardiology

## 2018-12-20 HISTORY — PX: LEFT HEART CATH AND CORONARY ANGIOGRAPHY: CATH118249

## 2018-12-20 LAB — GLUCOSE, CAPILLARY
Glucose-Capillary: 78 mg/dL (ref 70–99)
Glucose-Capillary: 72 mg/dL (ref 70–99)
Glucose-Capillary: 145 mg/dL — ABNORMAL HIGH (ref 70–99)

## 2018-12-20 SURGERY — LEFT HEART CATH AND CORONARY ANGIOGRAPHY
Anesthesia: LOCAL

## 2018-12-20 MED ORDER — ONDANSETRON HCL 4 MG/2ML IJ SOLN: 4.0000 mg | Freq: Four times a day (QID) | INTRAMUSCULAR | Status: DC | PRN

## 2018-12-20 MED ORDER — SITAGLIPTIN PHOS-METFORMIN HCL 50-1000 MG PO TABS: 1.0000 | ORAL_TABLET | Freq: Two times a day (BID) | ORAL | Status: DC

## 2018-12-20 MED ORDER — HEPARIN SODIUM (PORCINE) 1000 UNIT/ML IJ SOLN
INTRAMUSCULAR | Status: DC | PRN
  Administered 2018-12-20: 6000 [IU] via INTRAVENOUS

## 2018-12-20 MED ORDER — LIDOCAINE HCL (PF) 1 % IJ SOLN
INTRAMUSCULAR | Status: DC | PRN
  Administered 2018-12-20: 2 mL

## 2018-12-20 MED ORDER — SODIUM CHLORIDE 0.9% FLUSH: 3.0000 mL | Freq: Two times a day (BID) | INTRAVENOUS | Status: DC

## 2018-12-20 MED ORDER — SODIUM CHLORIDE 0.9% FLUSH: 3.0000 mL | INTRAVENOUS | Status: DC | PRN

## 2018-12-20 MED ORDER — FENTANYL CITRATE (PF) 100 MCG/2ML IJ SOLN
INTRAMUSCULAR | Status: AC
  Filled 2018-12-20: qty 2

## 2018-12-20 MED ORDER — HEPARIN (PORCINE) IN NACL 1000-0.9 UT/500ML-% IV SOLN
INTRAVENOUS | Status: AC
  Filled 2018-12-20: qty 1000

## 2018-12-20 MED ORDER — LIDOCAINE HCL (PF) 1 % IJ SOLN
INTRAMUSCULAR | Status: AC
  Filled 2018-12-20: qty 30

## 2018-12-20 MED ORDER — FENTANYL CITRATE (PF) 100 MCG/2ML IJ SOLN
INTRAMUSCULAR | Status: DC | PRN
  Administered 2018-12-20: 50 ug via INTRAVENOUS

## 2018-12-20 MED ORDER — SODIUM CHLORIDE 0.9 % IV SOLN: 250.0000 mL | INTRAVENOUS | Status: DC | PRN

## 2018-12-20 MED ORDER — MIDAZOLAM HCL 2 MG/2ML IJ SOLN
INTRAMUSCULAR | Status: DC | PRN
  Administered 2018-12-20: 2 mg via INTRAVENOUS

## 2018-12-20 MED ORDER — HEPARIN (PORCINE) IN NACL 1000-0.9 UT/500ML-% IV SOLN
INTRAVENOUS | Status: DC | PRN
  Administered 2018-12-20 (×2): 500 mL

## 2018-12-20 MED ORDER — SODIUM CHLORIDE 0.9 % WEIGHT BASED INFUSION: 1.0000 mL/kg/h | INTRAVENOUS | Status: AC

## 2018-12-20 MED ORDER — MIDAZOLAM HCL 2 MG/2ML IJ SOLN
INTRAMUSCULAR | Status: AC
  Filled 2018-12-20: qty 2

## 2018-12-20 MED ORDER — VERAPAMIL HCL 2.5 MG/ML IV SOLN
INTRAVENOUS | Status: DC | PRN
  Administered 2018-12-20: 10 mL via INTRA_ARTERIAL

## 2018-12-20 MED ORDER — VERAPAMIL HCL 2.5 MG/ML IV SOLN
INTRAVENOUS | Status: AC
  Filled 2018-12-20: qty 2

## 2018-12-20 MED ORDER — IOHEXOL 350 MG/ML SOLN
INTRAVENOUS | Status: DC | PRN
  Administered 2018-12-20: 40 mL via INTRA_ARTERIAL

## 2018-12-20 MED FILL — ELIQUIS 5 MG TABLET: 5 | 30 days supply | Qty: 60 | Fill #0

## 2018-12-20 MED FILL — POTASSIUM CL ER 20 MEQ TABL: 20 | 30 days supply | Qty: 30 | Fill #0

## 2018-12-20 MED FILL — SOTALOL HCL 80 MG TAB: 80 | 40 days supply | Qty: 60 | Fill #0

## 2018-12-20 SURGICAL SUPPLY — 9 items
BAND ZEPHYR COMPRESS 30 LONG (HEMOSTASIS) ×2 IMPLANT
CATH OPTITORQUE TIG 4.0 5F (CATHETERS) ×2 IMPLANT
GUIDEWIRE INQWIRE 1.5J.035X260 (WIRE) ×1 IMPLANT
INQWIRE 1.5J .035X260CM (WIRE) ×2
KIT HEART LEFT (KITS) ×2 IMPLANT
PACK CARDIAC CATHETERIZATION (CUSTOM PROCEDURE TRAY) ×2 IMPLANT
SHEATH RAIN RADIAL 21G 6FR (SHEATH) ×2 IMPLANT
TRANSDUCER W/STOPCOCK (MISCELLANEOUS) ×2 IMPLANT
TUBING CIL FLEX 10 FLL-RA (TUBING) ×2 IMPLANT

## 2018-12-20 NOTE — Progress Notes (Signed)
Received pt. From Cathlab, still drowsy but easily arousable and oriented. Right Radial site level 0, pt able to move fingers. Will monitor accordingly.

## 2018-12-20 NOTE — Interval H&P Note (Signed)
History and Physical Interval Note:  12/20/2018 7:34 AM  Joshua Lara  has presented today for surgery, with the diagnosis of Chest pain and atrial fibrillation.  The various methods of treatment have been discussed with the patient and family. After consideration of risks, benefits and other options for treatment, the patient has consented to  Procedure(s): LEFT HEART CATH AND CORONARY ANGIOGRAPHY and possible intervention (N/A) as a surgical intervention.  The patient's history has been reviewed, patient examined, no change in status, stable for surgery.  I have reviewed the patient's chart and labs.  Questions were answered to the patient's satisfaction.     Adrian Prows  Cath Lab Visit (complete for each Cath Lab visit)  Clinical Evaluation Leading to the Procedure:   ACS: Yes.    Non-ACS:    Anginal Classification: CCS IV  Anti-ischemic medical therapy: Minimal Therapy (1 class of medications)  Non-Invasive Test Results: No non-invasive testing performed  Prior CABG: No previous CABG

## 2018-12-20 NOTE — Progress Notes (Signed)
Cath lab to pick up pt for a first case heart cath. Called and notified daughter with patients permission. Pt spoke to daughter before leaving the floor.

## 2018-12-20 NOTE — Plan of Care (Signed)

## 2018-12-20 NOTE — Progress Notes (Signed)
D/W his daughter the findings. Reassured her.  Joshua Lara

## 2018-12-21 ENCOUNTER — Ambulatory Visit: Payer: Self-pay | Admitting: Cardiology

## 2018-12-21 ENCOUNTER — Telehealth: Payer: Self-pay

## 2018-12-21 NOTE — Telephone Encounter (Signed)
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

## 2018-12-21 NOTE — Telephone Encounter (Signed)
Left detailed message on machine.

## 2018-12-21 NOTE — Telephone Encounter (Signed)
Location of hospitalization: [place] Reason for hospitalization: Atrial Fib Date of discharge: 12/20/2018 Date of first communication with patient: today Person contacting patient: Cheri Kearns RMA Current symptoms: Pt states that when he bends over he gets a sharp pain in L side of chest; Overall feeling better  Do you understand why you were in the Hospital: Yes Questions regarding discharge instructions: None Where were you discharged to: Home Medications reviewed: Yes Allergies reviewed: Yes Dietary changes reviewed: Yes. Discussed low fat and low salt diet.  Referals reviewed: NA Activities of Daily Living: Able to with mild limitations Any transportation issues/concerns: None Any patient concerns: None Confirmed importance & date/time of Follow up appt: Yes Confirmed with patient if condition begins to worsen call. Pt was given the office number and encouraged to call back with questions or concerns: Yes

## 2018-12-28 ENCOUNTER — Ambulatory Visit (INDEPENDENT_AMBULATORY_CARE_PROVIDER_SITE_OTHER): Payer: Medicare Other | Admitting: Cardiology

## 2018-12-28 ENCOUNTER — Encounter: Payer: Self-pay | Admitting: Cardiology

## 2018-12-28 ENCOUNTER — Other Ambulatory Visit: Payer: Self-pay

## 2018-12-28 VITALS — BP 120/69 | HR 88 | Ht 70.0 in | Wt 253.1 lb

## 2018-12-28 DIAGNOSIS — I480 Paroxysmal atrial fibrillation: Secondary | ICD-10-CM | POA: Diagnosis not present

## 2018-12-28 DIAGNOSIS — I5032 Chronic diastolic (congestive) heart failure: Secondary | ICD-10-CM

## 2018-12-28 DIAGNOSIS — I10 Essential (primary) hypertension: Secondary | ICD-10-CM

## 2018-12-28 MED ORDER — FUROSEMIDE 40 MG PO TABS: 40.0000 mg | ORAL_TABLET | Freq: Every day | ORAL | 1 refills | Status: DC | PRN

## 2018-12-28 NOTE — Progress Notes (Signed)
Primary Physician:  Cyndi Bender, PA-C   Patient ID: Joshua Lara, male    DOB: 16-Jun-1945, 73 y.o.   MRN: [id number]  Subjective:    Chief Complaint  Patient presents with  . Congestive Heart Failure  . hosp fu    HPI: Joshua Lara  is a 73 y.o. male  with chronic diastolic heart failure, hypertension, hyperlipidemia, morbid obesity, obstructive sleep apnea on CPAP, hypertension, uncontrolled diabetes mellitus, admitted to the hospital on 12/17/2018 with shortness of breath and chest pain related to acute systolic and diastolic heart failure and found to be in A fib with RVR on presentation that spontaneously converted.  Prior to being seen at [facility], he was evaluated at [facility] the day before with similar complaints, where BNP was elevated, he was prescribed Lasix and discharged home. He developed chest pain, which led him to [facility] hospital. During his hospital stay, he was started on Sotalol as his heart failure was felt to be related to A fib. Echo on 08/09 showed mildly reduced LVEF of 45-50%. Although chest pain felt to be musculoskeletal, in view of newly noted LVEF depression and continued chest pain, underwent coronary angiogram on 08/10 that revealed normal coronary arteries. He now presents for follow up.  He is on low dose Sotalol due to QT prolongation while in the hospital.  Since discharge, patient states that he is doing well and symptoms have significantly improved. He has not had further episodes of chest pain and has had minimal dyspnea on exertion. He is tolerating medications well. Right radial access site is healing well.   Past Medical History:  Diagnosis Date  . Arthritis    "hands, back, ankles" (05/11/2014)  . CHF (congestive heart failure) ([place])   . Chronic lower back pain   . Compression fracture of lumbar vertebra ([place])   . Coronary artery disease   . Daily headache    "recently" (05/11/2014)  . Depression    "I  might be slight" (05/11/2014)  . GERD (gastroesophageal reflux disease)   . High cholesterol   . Hypertension   . Hypothyroid   . OSA treated with BiPAP   . Pneumonia    "couple times; last time was 03/2011" (05/11/2014)  . Type II diabetes mellitus ([place])     Past Surgical History:  Procedure Laterality Date  . APPENDECTOMY    . CARDIAC CATHETERIZATION  2001; 2009  . CATARACT EXTRACTION W/ INTRAOCULAR LENS IMPLANT Left   . CHOLECYSTECTOMY  1980's   Archie Endo 07/13/2010   . EYE MUSCLE SURGERY Left   . LEFT AND RIGHT HEART CATHETERIZATION WITH CORONARY ANGIOGRAM N/A 05/30/2014   Procedure: LEFT AND RIGHT HEART CATHETERIZATION WITH CORONARY ANGIOGRAM;  Surgeon: Jolaine Artist, MD;  Location: [facility] CATH LAB;  Service: Cardiovascular;  Laterality: N/A;  . LEFT HEART CATH AND CORONARY ANGIOGRAPHY N/A 12/20/2018   Procedure: LEFT HEART CATH AND CORONARY ANGIOGRAPHY and possible intervention;  Surgeon: Adrian Prows, MD;  Location: [place] CV LAB;  Service: Cardiovascular;  Laterality: N/A;  . MASTOIDECTOMY Right 1970's   Archie Endo 07/13/2010  . SHOULDER OPEN ROTATOR CUFF REPAIR Left 07/2010  . SPHINCTEROTOMY    . TONSILLECTOMY AND ADENOIDECTOMY      Social History   Socioeconomic History  . Marital status: Widowed    Spouse name: Not on file  . Number of children: 2  . Years of education: Not on file  . Highest education level: Not on file  Occupational History  .  Occupation: retired  Scientific laboratory technician  . Financial resource strain: Not on file  . Food insecurity    Worry: Not on file    Inability: Not on file  . Transportation needs    Medical: Not on file    Non-medical: Not on file  Tobacco Use  . Smoking status: Former Smoker    Packs/day: 2.00    Years: 30.00    Pack years: 60.00    Types: Cigarettes    Quit date: 05/12/1990    Years since quitting: 28.6  . Smokeless tobacco: Never Used  Substance and Sexual Activity  . Alcohol use: Yes    Alcohol/week: 0.0 standard drinks     Comment: 05/11/2014 "an occasional beer when out w/people; not often"  . Drug use: No  . Sexual activity: Never  Lifestyle  . Physical activity    Days per week: Not on file    Minutes per session: Not on file  . Stress: Not on file  Relationships  . Social Herbalist on phone: Not on file    Gets together: Not on file    Attends religious service: Not on file    Active member of club or organization: Not on file    Attends meetings of clubs or organizations: Not on file    Relationship status: Not on file  . Intimate partner violence    Fear of current or ex partner: Not on file    Emotionally abused: Not on file    Physically abused: Not on file    Forced sexual activity: Not on file  Other Topics Concern  . Not on file  Social History Narrative  . Not on file    Review of Systems  Constitution: Negative for decreased appetite, malaise/fatigue, weight gain and weight loss.  Eyes: Negative for visual disturbance.  Cardiovascular: Negative for chest pain, claudication, dyspnea on exertion, leg swelling, orthopnea, palpitations and syncope.  Respiratory: Negative for hemoptysis and wheezing.   Endocrine: Negative for cold intolerance and heat intolerance.  Hematologic/Lymphatic: Does not bruise/bleed easily.  Skin: Negative for nail changes.  Musculoskeletal: Negative for muscle weakness and myalgias.  Gastrointestinal: Negative for abdominal pain, change in bowel habit, nausea and vomiting.  Neurological: Negative for difficulty with concentration, dizziness, focal weakness and headaches.  Psychiatric/Behavioral: Negative for altered mental status and suicidal ideas.  All other systems reviewed and are negative.     Objective:  Blood pressure 120/69, pulse 88, height 5\' 10"  (1.778 m), weight 253 lb 1.6 oz (114.8 kg), SpO2 96 %. Body mass index is 36.32 kg/m.    Physical Exam  Constitutional: He is oriented to person, place, and time. Vital signs are normal. He  appears well-developed and well-nourished.  HENT:  Head: Normocephalic and atraumatic.  Neck: Normal range of motion.  Cardiovascular: Normal rate, regular rhythm, normal heart sounds and intact distal pulses.  Pulmonary/Chest: Effort normal and breath sounds normal. No accessory muscle usage. No respiratory distress.  Abdominal: Soft. Bowel sounds are normal.  Musculoskeletal: Normal range of motion.  Neurological: He is alert and oriented to person, place, and time.  Skin: Skin is warm and dry.  Vitals reviewed.  Radiology: No results found.  Laboratory examination:    CMP Latest Ref Rng & Units 12/19/2018 12/17/2018 12/16/2018  Glucose 70 - 99 mg/dL 144(H) 219(H) 333(H)  BUN 8 - 23 mg/dL 20 10 10   Creatinine 0.61 - 1.24 mg/dL 1.01 0.99 0.97  Sodium 135 - 145 mmol/L 136  136 135  Potassium 3.5 - 5.1 mmol/L 3.8 3.1(L) 3.6  Chloride 98 - 111 mmol/L 98 97(L) 96(L)  CO2 22 - 32 mmol/L 26 27 27   Calcium 8.9 - 10.3 mg/dL 9.9 9.8 9.9  Total Protein 6.5 - 8.1 g/dL - 8.2(H) -  Total Bilirubin 0.3 - 1.2 mg/dL - 2.0(H) -  Alkaline Phos 38 - 126 U/L - 112 -  AST 15 - 41 U/L - 24 -  ALT 0 - 44 U/L - 27 -   CBC Latest Ref Rng & Units 12/16/2018 11/05/2016 11/04/2016  WBC 4.0 - 10.5 K/uL 8.2 10.6(H) 10.5  Hemoglobin 13.0 - 17.0 g/dL 14.1 11.8(L) 12.2(L)  Hematocrit 39.0 - 52.0 % 40.4 36.0(L) 36.3(L)  Platelets 150 - 400 K/uL 194 224 211   Lipid Panel     Component Value Date/Time   CHOL 142 12/17/2018 0420   TRIG 109 12/17/2018 0420   HDL 59 12/17/2018 0420   CHOLHDL 2.4 12/17/2018 0420   VLDL 22 12/17/2018 0420   LDLCALC 61 12/17/2018 0420   HEMOGLOBIN A1C Lab Results  Component Value Date   HGBA1C 6.4 (H) 12/17/2018   MPG 136.98 12/17/2018   TSH Recent Labs    12/17/18 0420  TSH 3.819    PRN Meds:. Medications Discontinued During This Encounter  Medication Reason  . furosemide (LASIX) 40 MG tablet Reorder   Current Meds  Medication Sig  . apixaban (ELIQUIS) 5 MG TABS  tablet Take 1 tablet (5 mg total) by mouth every 12 (twelve) hours.  . bimatoprost (LUMIGAN) 0.01 % SOLN Place 1 drop into both eyes at bedtime.   . Cholecalciferol (VITAMIN D3) 2000 units capsule Take 2,000 Units by mouth daily.  . DULoxetine (CYMBALTA) 60 MG capsule Take 60 mg by mouth daily.   . Insulin Glargine (BASAGLAR KWIKPEN [place]) Inject 100 Units into the skin 2 (two) times daily.   Marland Kitchen levothyroxine (SYNTHROID, LEVOTHROID) 100 MCG tablet Take 100 mcg by mouth daily.    Marland Kitchen losartan (COZAAR) 50 MG tablet Take 0.5 tablets (25 mg total) by mouth daily.  . magnesium oxide (MAG-OX) 400 MG tablet Take 400 mg by mouth daily.  . nitroGLYCERIN (NITROSTAT) 0.4 MG SL tablet Place 0.4 mg under the tongue every 5 (five) minutes as needed for chest pain.  . potassium chloride (K-DUR) 20 MEQ tablet Take 1 tablet (20 mEq total) by mouth daily. With Furosemide  . pravastatin (PRAVACHOL) 40 MG tablet Take 40 mg by mouth daily.  . sitaGLIPtin-metformin (JANUMET) 50-1000 MG tablet Take 1 tablet by mouth 2 (two) times daily with a meal.  . sotalol (BETAPACE) 80 MG tablet Take 0.5 tablets (40 mg total) by mouth every morning. 1 tablet in the evening  . vitamin C (ASCORBIC ACID) 500 MG tablet Take 500 mg by mouth daily.    Cardiac Studies:   Coronary angiogram 12/20/2018: Left ventricle is borderline dilated.  Mild decrease in LV systolic function with global hypokinesis, EF 45%.  Normal LVEDP. Minimal luminal irregularity, no coronary artery disease.  Right dominant circulation.  Echo 12/19/2018:  1. The left ventricle has mildly reduced systolic function, with an ejection fraction of 45-50%. The cavity size was mildly dilated. Indeterminate diastolic filling due to E-A fusion. E/e" suggests elevated LA and LVEDP Left ventrical global hypokinesis  without regional wall motion abnormalities.  2. Left atrial size was moderately dilated.  3. Right atrial size was mildly dilated.  4. The mitral valve is grossly  normal. Mild thickening of the  mitral valve leaflet. Mild calcification of the mitral valve leaflet.  5. The aortic valve is grossly normal. Mild calcification of the aortic valve.  6. Right ventricle is not well visualized but appears mildly dilated with normal function.  7. IVC is dilated with blunted respiratory response, suggesting elevated central venous pressure.  8. The aorta is normal in size and structure.  9. The right ventricle Right ventricle is not well visualized but appears mildly dilated with normal function.. The cavity was mildly enlarged. There is no increase in right ventricular wall thickness.  Assessment:     ICD-10-CM   1. Paroxysmal atrial fibrillation (HCC)  I48.0 EKG 12-Lead  2. Chronic diastolic heart failure (HCC)  I50.32 EKG 12-Lead    PCV ECHOCARDIOGRAM COMPLETE  3. Essential hypertension  I10   4. Morbid obesity ([place])  E66.01    EKG 12/28/2018: Normal sinus rhythm at 92 bpm with first degree AV block, 1 PVC, rightward axis, PRWP cannot exclude anterior infarct old. NO evidence of ischemia.   CHA2DS2-VASc Score is3. Yearly risk of stroke: 3.2%. Score of 1=1.3; 2=2.2; 3=3.2; 4=4; 5=6.7; 6=9.8; 7=>9.8) -(CHF; HTN; vasc disease DM, Male = 1; Age <65 =0; 65-74 = 1, >75 =2; stroke = 2).   Recommendations:   Patient is presently doing well since being discharged from the hospital, has had significant improvement in his symptoms.  Does continue to have occasional shortness of breath with overexertion.  He is tolerating medications well.  He has been out of his Lasix for the last several days, I will refill this.  He will take on an as-needed basis because he is overall doing well without Lasix.  He is encouraged to take for greater than 2 pound weight gain, worsening shortness of breath, or leg edema.  He has been making dietary changes and has lost 15 pounds, congratulated him on this.  He has not had known recurrence of A. fib.  QT interval has remained stable  with sotalol.  We will continue the same.  Recent echocardiogram showed mildly depressed LVEF, likely related to A. fib.  We will repeat his echocardiogram in 3 months for surveillance of EF.  Blood pressure is well controlled.  He is tolerating anticoagulation well, no bleeding diathesis.  Right radial access site is healing well, no hematoma.  He was not found to have any coronary artery stenosis by coronary angiogram.  Continue with risk factor modification.  We will see him back in 3 months or sooner if needed.  Miquel Dunn, MSN, APRN, FNP-C [facility]. [place] Office: [phone] Fax: [phone]

## 2019-01-03 DIAGNOSIS — I2510 Atherosclerotic heart disease of native coronary artery without angina pectoris: Secondary | ICD-10-CM | POA: Diagnosis not present

## 2019-01-03 DIAGNOSIS — I480 Paroxysmal atrial fibrillation: Secondary | ICD-10-CM | POA: Diagnosis not present

## 2019-01-03 DIAGNOSIS — E118 Type 2 diabetes mellitus with unspecified complications: Secondary | ICD-10-CM | POA: Diagnosis not present

## 2019-01-03 DIAGNOSIS — I5030 Unspecified diastolic (congestive) heart failure: Secondary | ICD-10-CM | POA: Diagnosis not present

## 2019-01-14 ENCOUNTER — Ambulatory Visit (INDEPENDENT_AMBULATORY_CARE_PROVIDER_SITE_OTHER): Payer: Medicare Other

## 2019-01-14 ENCOUNTER — Other Ambulatory Visit: Payer: Self-pay

## 2019-01-14 DIAGNOSIS — I5032 Chronic diastolic (congestive) heart failure: Secondary | ICD-10-CM

## 2019-01-18 NOTE — Progress Notes (Signed)
Pt called to inform him about his echo. Pt understood.

## 2019-01-19 ENCOUNTER — Telehealth: Payer: Self-pay

## 2019-01-19 DIAGNOSIS — I5033 Acute on chronic diastolic (congestive) heart failure: Secondary | ICD-10-CM

## 2019-01-19 MED ORDER — APIXABAN 5 MG PO TABS: 5.0000 mg | ORAL_TABLET | Freq: Two times a day (BID) | ORAL | 3 refills | Status: DC

## 2019-01-19 MED ORDER — POTASSIUM CHLORIDE CRYS ER 20 MEQ PO TBCR: 20.0000 meq | EXTENDED_RELEASE_TABLET | Freq: Every day | ORAL | 1 refills | Status: DC

## 2019-01-25 DIAGNOSIS — Z6837 Body mass index (BMI) 37.0-37.9, adult: Secondary | ICD-10-CM | POA: Diagnosis not present

## 2019-01-25 DIAGNOSIS — Z23 Encounter for immunization: Secondary | ICD-10-CM | POA: Diagnosis not present

## 2019-01-25 DIAGNOSIS — E118 Type 2 diabetes mellitus with unspecified complications: Secondary | ICD-10-CM | POA: Diagnosis not present

## 2019-01-25 DIAGNOSIS — L02213 Cutaneous abscess of chest wall: Secondary | ICD-10-CM | POA: Diagnosis not present

## 2019-02-02 ENCOUNTER — Telehealth: Payer: Self-pay

## 2019-02-02 NOTE — Telephone Encounter (Signed)
Pt called and is having severe aches and pains cannot move his muscles.

## 2019-02-02 NOTE — Telephone Encounter (Signed)
Did this just suddenly start? Not sure if related to medication. Can try holding pravastatin and see if this helps, but do hold for longer than 2 weeks without letting us know if this helped or not.

## 2019-02-02 NOTE — Telephone Encounter (Signed)
Pt agreed to hold pravastatin meds and call us in a few days to let us know if he feels better

## 2019-02-02 NOTE — Telephone Encounter (Signed)
It has been getting progressively worse

## 2019-02-02 NOTE — Telephone Encounter (Signed)
Continue with recommendations as above

## 2019-02-04 IMAGING — CR DG CHEST 1V PORT
1 series · 1 of 1 positions shown · non-contrast
Comparison: 12/25/2015

CLINICAL DATA: Tongue swelling.  Respiratory distress.  Intubation.

EXAM:
PORTABLE CHEST 1 VIEW

[AP]
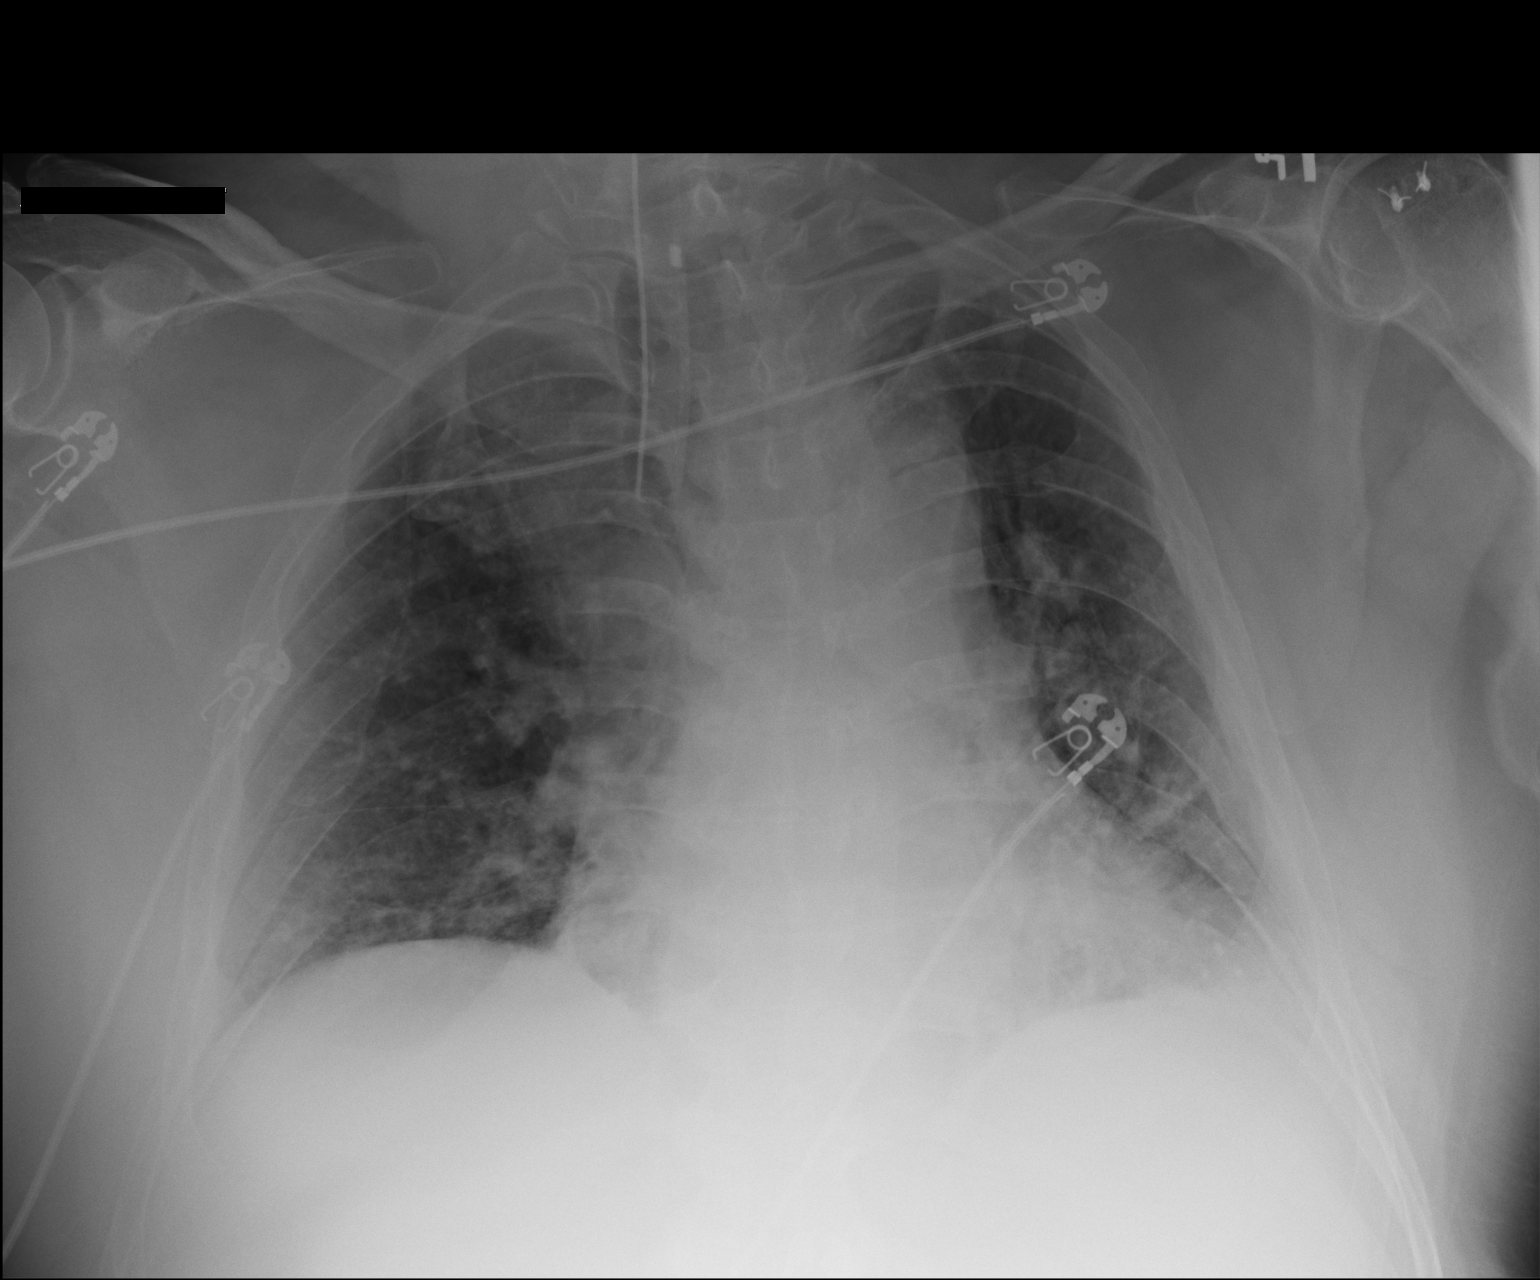

[1 of 1 positions shown; findings below may reference images not displayed]

FINDINGS: Endotracheal tube is 3.6 cm above the carina. Mild streaky opacities
in the bases may represent scarring or atelectasis. No confluent
consolidation. No large effusion. No pneumothorax.
IMPRESSION: Satisfactory ET tube position.  No consolidation or large effusion.

## 2019-02-10 NOTE — Telephone Encounter (Signed)
Sent in meds 

## 2019-02-11 DIAGNOSIS — I480 Paroxysmal atrial fibrillation: Secondary | ICD-10-CM | POA: Diagnosis not present

## 2019-02-11 DIAGNOSIS — M069 Rheumatoid arthritis, unspecified: Secondary | ICD-10-CM | POA: Diagnosis not present

## 2019-02-11 DIAGNOSIS — E118 Type 2 diabetes mellitus with unspecified complications: Secondary | ICD-10-CM | POA: Diagnosis not present

## 2019-02-11 DIAGNOSIS — Z6837 Body mass index (BMI) 37.0-37.9, adult: Secondary | ICD-10-CM | POA: Diagnosis not present

## 2019-03-18 ENCOUNTER — Other Ambulatory Visit: Payer: Self-pay

## 2019-03-18 DIAGNOSIS — I5033 Acute on chronic diastolic (congestive) heart failure: Secondary | ICD-10-CM

## 2019-03-18 MED ORDER — POTASSIUM CHLORIDE CRYS ER 20 MEQ PO TBCR: 20.0000 meq | EXTENDED_RELEASE_TABLET | Freq: Every day | ORAL | 1 refills | Status: DC

## 2019-03-21 DIAGNOSIS — Z794 Long term (current) use of insulin: Secondary | ICD-10-CM | POA: Diagnosis not present

## 2019-03-21 DIAGNOSIS — E1142 Type 2 diabetes mellitus with diabetic polyneuropathy: Secondary | ICD-10-CM | POA: Diagnosis not present

## 2019-03-21 DIAGNOSIS — T380X5A Adverse effect of glucocorticoids and synthetic analogues, initial encounter: Secondary | ICD-10-CM | POA: Diagnosis not present

## 2019-03-21 DIAGNOSIS — R197 Diarrhea, unspecified: Secondary | ICD-10-CM | POA: Diagnosis not present

## 2019-03-21 DIAGNOSIS — M2550 Pain in unspecified joint: Secondary | ICD-10-CM | POA: Diagnosis not present

## 2019-03-29 DIAGNOSIS — Z20828 Contact with and (suspected) exposure to other viral communicable diseases: Secondary | ICD-10-CM | POA: Diagnosis not present

## 2019-03-29 DIAGNOSIS — E118 Type 2 diabetes mellitus with unspecified complications: Secondary | ICD-10-CM | POA: Diagnosis not present

## 2019-04-01 ENCOUNTER — Ambulatory Visit: Payer: Medicare Other | Admitting: Cardiology

## 2019-04-04 ENCOUNTER — Other Ambulatory Visit: Payer: Self-pay

## 2019-04-04 DIAGNOSIS — I480 Paroxysmal atrial fibrillation: Secondary | ICD-10-CM

## 2019-04-04 MED ORDER — SOTALOL HCL 80 MG PO TABS: 40.0000 mg | ORAL_TABLET | ORAL | 1 refills | Status: DC

## 2019-04-11 DIAGNOSIS — K115 Sialolithiasis: Secondary | ICD-10-CM | POA: Diagnosis not present

## 2019-04-11 DIAGNOSIS — M069 Rheumatoid arthritis, unspecified: Secondary | ICD-10-CM | POA: Diagnosis not present

## 2019-04-11 DIAGNOSIS — Z20828 Contact with and (suspected) exposure to other viral communicable diseases: Secondary | ICD-10-CM | POA: Diagnosis not present

## 2019-04-11 DIAGNOSIS — K1120 Sialoadenitis, unspecified: Secondary | ICD-10-CM | POA: Diagnosis not present

## 2019-04-15 DIAGNOSIS — R5383 Other fatigue: Secondary | ICD-10-CM | POA: Diagnosis not present

## 2019-04-15 DIAGNOSIS — E669 Obesity, unspecified: Secondary | ICD-10-CM | POA: Diagnosis not present

## 2019-04-15 DIAGNOSIS — M5136 Other intervertebral disc degeneration, lumbar region: Secondary | ICD-10-CM | POA: Diagnosis not present

## 2019-04-15 DIAGNOSIS — Z6837 Body mass index (BMI) 37.0-37.9, adult: Secondary | ICD-10-CM | POA: Diagnosis not present

## 2019-04-15 DIAGNOSIS — M0609 Rheumatoid arthritis without rheumatoid factor, multiple sites: Secondary | ICD-10-CM | POA: Diagnosis not present

## 2019-04-15 DIAGNOSIS — M1A09X0 Idiopathic chronic gout, multiple sites, without tophus (tophi): Secondary | ICD-10-CM | POA: Diagnosis not present

## 2019-04-20 ENCOUNTER — Other Ambulatory Visit: Payer: Self-pay

## 2019-04-20 ENCOUNTER — Ambulatory Visit (INDEPENDENT_AMBULATORY_CARE_PROVIDER_SITE_OTHER): Payer: Medicare Other | Admitting: Cardiology

## 2019-04-20 ENCOUNTER — Encounter: Payer: Self-pay | Admitting: Cardiology

## 2019-04-20 VITALS — BP 139/77 | HR 76 | Resp 16 | Ht 69.0 in | Wt 255.3 lb

## 2019-04-20 DIAGNOSIS — I10 Essential (primary) hypertension: Secondary | ICD-10-CM

## 2019-04-20 DIAGNOSIS — I5032 Chronic diastolic (congestive) heart failure: Secondary | ICD-10-CM | POA: Diagnosis not present

## 2019-04-20 DIAGNOSIS — E782 Mixed hyperlipidemia: Secondary | ICD-10-CM

## 2019-04-20 DIAGNOSIS — I480 Paroxysmal atrial fibrillation: Secondary | ICD-10-CM | POA: Diagnosis not present

## 2019-04-20 MED ORDER — POTASSIUM CHLORIDE CRYS ER 20 MEQ PO TBCR: 20.0000 meq | EXTENDED_RELEASE_TABLET | ORAL | 2 refills | Status: DC

## 2019-04-20 MED ORDER — LIVALO 2 MG PO TABS: 2.0000 mg | ORAL_TABLET | Freq: Every day | ORAL | 3 refills | Status: DC

## 2019-04-20 NOTE — Progress Notes (Signed)
Primary Physician:  Cyndi Bender, PA-C   Patient ID: Joshua Lara, male    DOB: 06-20-1945, 73 y.o.   MRN: [id number]  Subjective:    Chief Complaint  Patient presents with  . Atrial Fibrillation  . Congestive Heart Failure  . Follow-up    3 month    HPI: Joshua Lara  is a 73 y.o. male  with chronic diastolic heart failure, hypertension, hyperlipidemia, morbid obesity, obstructive sleep apnea on CPAP, hypertension, uncontrolled diabetes mellitus, admitted to the hospital on 12/17/2018 with shortness of breath and chest pain related to acute systolic and diastolic heart failure and found to be in A fib with RVR on presentation that spontaneously converted. He was started on stoalol therapy due to depressed LVEF and underwent coronary angiogram on 08/10 that revealed normal coronary arteries.   He now presents for 6 month follow up. He has not had any known recurrence of A fib since being on Sotalol, on low dose Sotalol due to QT prolongation. States that he is doing well. Dyspnea on exertion is unchanged.   He reports stopping is statin due to myalgia symptoms and myalgia has improved.    Past Medical History:  Diagnosis Date  . Arthritis    "hands, back, ankles" (05/11/2014)  . CHF (congestive heart failure) ([place])   . Chronic lower back pain   . Compression fracture of lumbar vertebra ([place])   . Coronary artery disease   . Daily headache    "recently" (05/11/2014)  . Depression    "I might be slight" (05/11/2014)  . GERD (gastroesophageal reflux disease)   . High cholesterol   . Hypertension   . Hypothyroid   . OSA treated with BiPAP   . Pneumonia    "couple times; last time was 03/2011" (05/11/2014)  . Type II diabetes mellitus ([place])     Past Surgical History:  Procedure Laterality Date  . APPENDECTOMY    . CARDIAC CATHETERIZATION  2001; 2009  . CATARACT EXTRACTION W/ INTRAOCULAR LENS IMPLANT Left   . CHOLECYSTECTOMY  1980's   Archie Endo 07/13/2010   . EYE  MUSCLE SURGERY Left   . LEFT AND RIGHT HEART CATHETERIZATION WITH CORONARY ANGIOGRAM N/A 05/30/2014   Procedure: LEFT AND RIGHT HEART CATHETERIZATION WITH CORONARY ANGIOGRAM;  Surgeon: Jolaine Artist, MD;  Location: [facility] CATH LAB;  Service: Cardiovascular;  Laterality: N/A;  . LEFT HEART CATH AND CORONARY ANGIOGRAPHY N/A 12/20/2018   Procedure: LEFT HEART CATH AND CORONARY ANGIOGRAPHY and possible intervention;  Surgeon: Adrian Prows, MD;  Location: [facility] CV LAB;  Service: Cardiovascular;  Laterality: N/A;  . MASTOIDECTOMY Right 1970's   Archie Endo 07/13/2010  . SHOULDER OPEN ROTATOR CUFF REPAIR Left 07/2010  . SPHINCTEROTOMY    . TONSILLECTOMY AND ADENOIDECTOMY      Social History   Socioeconomic History  . Marital status: Widowed    Spouse name: Not on file  . Number of children: 2  . Years of education: Not on file  . Highest education level: Not on file  Occupational History  . Occupation: retired  Tobacco Use  . Smoking status: Former Smoker    Packs/day: 2.00    Years: 30.00    Pack years: 60.00    Types: Cigarettes    Quit date: 05/12/1990    Years since quitting: 28.9  . Smokeless tobacco: Never Used  Substance and Sexual Activity  . Alcohol use: Yes    Alcohol/week: 0.0 standard drinks    Comment: 05/11/2014 "an occasional  beer when out w/people; not often"  . Drug use: No  . Sexual activity: Never  Other Topics Concern  . Not on file  Social History Narrative  . Not on file   Social Determinants of Health   Financial Resource Strain:   . Difficulty of Paying Living Expenses: Not on file  Food Insecurity:   . Worried About Charity fundraiser in the Last Year: Not on file  . Ran Out of Food in the Last Year: Not on file  Transportation Needs:   . Lack of Transportation (Medical): Not on file  . Lack of Transportation (Non-Medical): Not on file  Physical Activity:   . Days of Exercise per Week: Not on file  . Minutes of Exercise per Session: Not on file   Stress:   . Feeling of Stress : Not on file  Social Connections:   . Frequency of Communication with Friends and Family: Not on file  . Frequency of Social Gatherings with Friends and Family: Not on file  . Attends Religious Services: Not on file  . Active Member of Clubs or Organizations: Not on file  . Attends Archivist Meetings: Not on file  . Marital Status: Not on file  Intimate Partner Violence:   . Fear of Current or Ex-Partner: Not on file  . Emotionally Abused: Not on file  . Physically Abused: Not on file  . Sexually Abused: Not on file    Review of Systems  Constitution: Negative for decreased appetite, malaise/fatigue, weight gain and weight loss.  Eyes: Negative for visual disturbance.  Cardiovascular: Positive for dyspnea on exertion (chronic and unchanged). Negative for chest pain, claudication, leg swelling, orthopnea, palpitations and syncope.  Respiratory: Negative for hemoptysis and wheezing.   Endocrine: Negative for cold intolerance and heat intolerance.  Hematologic/Lymphatic: Does not bruise/bleed easily.  Skin: Negative for nail changes.  Musculoskeletal: Negative for muscle weakness and myalgias.  Gastrointestinal: Negative for abdominal pain, change in bowel habit, nausea and vomiting.  Neurological: Negative for difficulty with concentration, dizziness, focal weakness and headaches.  Psychiatric/Behavioral: Negative for altered mental status and suicidal ideas.  All other systems reviewed and are negative.     Objective:  Blood pressure 139/77, pulse 76, resp. rate 16, height 5\' 9"  (1.753 m), weight 255 lb 4.8 oz (115.8 kg), SpO2 98 %. Body mass index is 37.7 kg/m.    Physical Exam  Constitutional: He is oriented to person, place, and time. Vital signs are normal. He appears well-developed and well-nourished.  HENT:  Head: Normocephalic and atraumatic.  Neck: Normal range of motion.  Cardiovascular: Normal rate, regular rhythm, normal  heart sounds and intact distal pulses.  Pulmonary/Chest: Effort normal and breath sounds normal. No accessory muscle usage. No respiratory distress.  Abdominal: Soft. Bowel sounds are normal.  Musculoskeletal: Normal range of motion.  Neurological: He is alert and oriented to person, place, and time.  Skin: Skin is warm and dry.  Vitals reviewed.  Radiology: No results found.  Laboratory examination:    CMP Latest Ref Rng & Units 12/19/2018 12/17/2018 12/16/2018  Glucose 70 - 99 mg/dL 144(H) 219(H) 333(H)  BUN 8 - 23 mg/dL 20 10 10   Creatinine 0.61 - 1.24 mg/dL 1.01 0.99 0.97  Sodium 135 - 145 mmol/L 136 136 135  Potassium 3.5 - 5.1 mmol/L 3.8 3.1(L) 3.6  Chloride 98 - 111 mmol/L 98 97(L) 96(L)  CO2 22 - 32 mmol/L 26 27 27   Calcium 8.9 - 10.3 mg/dL 9.9 9.8  9.9  Total Protein 6.5 - 8.1 g/dL - 8.2(H) -  Total Bilirubin 0.3 - 1.2 mg/dL - 2.0(H) -  Alkaline Phos 38 - 126 U/L - 112 -  AST 15 - 41 U/L - 24 -  ALT 0 - 44 U/L - 27 -   CBC Latest Ref Rng & Units 12/16/2018 11/05/2016 11/04/2016  WBC 4.0 - 10.5 K/uL 8.2 10.6(H) 10.5  Hemoglobin 13.0 - 17.0 g/dL 14.1 11.8(L) 12.2(L)  Hematocrit 39.0 - 52.0 % 40.4 36.0(L) 36.3(L)  Platelets 150 - 400 K/uL 194 224 211   Lipid Panel     Component Value Date/Time   CHOL 142 12/17/2018 0420   TRIG 109 12/17/2018 0420   HDL 59 12/17/2018 0420   CHOLHDL 2.4 12/17/2018 0420   VLDL 22 12/17/2018 0420   LDLCALC 61 12/17/2018 0420   HEMOGLOBIN A1C Lab Results  Component Value Date   HGBA1C 6.4 (H) 12/17/2018   MPG 136.98 12/17/2018   TSH Recent Labs    12/17/18 0420  TSH 3.819    PRN Meds:. Medications Discontinued During This Encounter  Medication Reason  . furosemide (LASIX) 40 MG tablet Error  . sitaGLIPtin-metformin (JANUMET) 50-1000 MG tablet Error  . pravastatin (PRAVACHOL) 40 MG tablet Error  . Insulin Glargine (BASAGLAR KWIKPEN ) Error  . potassium chloride SA (KLOR-CON) 20 MEQ tablet Duplicate  . potassium chloride SA  (KLOR-CON) 20 MEQ tablet Reorder   Current Meds  Medication Sig  . apixaban (ELIQUIS) 5 MG TABS tablet Take 1 tablet (5 mg total) by mouth every 12 (twelve) hours.  . bimatoprost (LUMIGAN) 0.01 % SOLN Place 1 drop into both eyes at bedtime.   . Cholecalciferol (VITAMIN D3) 2000 units capsule Take 2,000 Units by mouth daily.  Marland Kitchen doxycycline (VIBRAMYCIN) 100 MG capsule TAKE 1 CAPSULE BY MOUTH TWICE A DAY FOR SKIN INFECTION  . DULoxetine (CYMBALTA) 60 MG capsule Take 60 mg by mouth daily.   . furosemide (LASIX) 40 MG tablet Take 1 tablet (40 mg total) by mouth daily as needed.  Marland Kitchen glucosamine-chondroitin 500-400 MG tablet Take 1 tablet by mouth 3 (three) times daily.  Marland Kitchen HUMULIN R U-500 KWIKPEN 500 UNIT/ML kwikpen Inject 145 Units into the skin 2 (two) times daily.  Marland Kitchen levothyroxine (SYNTHROID, LEVOTHROID) 100 MCG tablet Take 100 mcg by mouth daily.    Marland Kitchen losartan (COZAAR) 50 MG tablet Take 0.5 tablets (25 mg total) by mouth daily.  . metoprolol succinate (TOPROL-XL) 25 MG 24 hr tablet Take 25 mg by mouth daily.  . nitroGLYCERIN (NITROSTAT) 0.4 MG SL tablet Place 0.4 mg under the tongue every 5 (five) minutes as needed for chest pain.  . predniSONE (DELTASONE) 10 MG tablet Take 10 mg by mouth 2 (two) times daily.  . sotalol (BETAPACE) 80 MG tablet Take 0.5 tablets (40 mg total) by mouth every morning. 1 tablet in the evening  . vitamin C (ASCORBIC ACID) 500 MG tablet Take 500 mg by mouth daily.    Cardiac Studies:   Coronary angiogram 12/20/2018: Left ventricle is borderline dilated.  Mild decrease in LV systolic function with global hypokinesis, EF 45%.  Normal LVEDP. Minimal luminal irregularity, no coronary artery disease.  Right dominant circulation.  Echo 12/19/2018:  1. The left ventricle has mildly reduced systolic function, with an ejection fraction of 45-50%. The cavity size was mildly dilated. Indeterminate diastolic filling due to E-A fusion. E/e" suggests elevated LA and LVEDP Left  ventrical global hypokinesis  without regional wall motion abnormalities.  2.  Left atrial size was moderately dilated.  3. Right atrial size was mildly dilated.  4. The mitral valve is grossly normal. Mild thickening of the mitral valve leaflet. Mild calcification of the mitral valve leaflet.  5. The aortic valve is grossly normal. Mild calcification of the aortic valve.  6. Right ventricle is not well visualized but appears mildly dilated with normal function.  7. IVC is dilated with blunted respiratory response, suggesting elevated central venous pressure.  8. The aorta is normal in size and structure.  9. The right ventricle Right ventricle is not well visualized but appears mildly dilated with normal function.. The cavity was mildly enlarged. There is no increase in right ventricular wall thickness.  Assessment:     ICD-10-CM   1. Paroxysmal atrial fibrillation (HCC)  I48.0   2. Chronic diastolic heart failure (HCC)  I50.32 EKG 12-Lead  3. Essential hypertension  I10   4. Morbid obesity ([place])  E66.01   5. Mixed hyperlipidemia  E78.2    EKG 04/20/2019: Normal sinus rhythm at 79 bpm with first degree AV block, rightward axis, PRWP cannot exclude anterior infarct old. Diffuse T wave abnormality. QT interval stable.   CHA2DS2-VASc Score is3. Yearly risk of stroke: 3.2%. Score of 1=1.3; 2=2.2; 3=3.2; 4=4; 5=6.7; 6=9.8; 7=>9.8) -(CHF; HTN; vasc disease DM, Male = 1; Age <65 =0; 65-74 = 1, >75 =2; stroke = 2).   Recommendations:   Patient is here for 6 month follow up. He is doing well without any complaints. He is continuing to maintain sinus rhythm with Sotalol. QT interval is stable. No bleeding reported. I have discussed recently obtained echocardiogram, LVEF continues to be depressed, but stable. Continue with metoprolol and Losartan.   Blood pressure is well controlled. He was previously on Pravastatin and has recently discontinued this due to myalgia, in view of diabetes and  history of hyperlipidemia, he should be on statin therapy if he can tolerate. He reports not tolerating other statins in the past, will start Livalo 2 mg daily. He will notify me on how he is tolerating. I have encouraged him to continue to work on his weight with diet modifications. I will see him back in 6 months or sooner if needed.  Miquel Dunn, MSN, APRN, FNP-C [facility]. [place] Office: [phone] Fax: [phone]

## 2019-04-21 DIAGNOSIS — E669 Obesity, unspecified: Secondary | ICD-10-CM | POA: Diagnosis not present

## 2019-04-21 DIAGNOSIS — M1A09X0 Idiopathic chronic gout, multiple sites, without tophus (tophi): Secondary | ICD-10-CM | POA: Diagnosis not present

## 2019-04-21 DIAGNOSIS — Z6837 Body mass index (BMI) 37.0-37.9, adult: Secondary | ICD-10-CM | POA: Diagnosis not present

## 2019-04-21 DIAGNOSIS — M5136 Other intervertebral disc degeneration, lumbar region: Secondary | ICD-10-CM | POA: Diagnosis not present

## 2019-04-21 DIAGNOSIS — M0609 Rheumatoid arthritis without rheumatoid factor, multiple sites: Secondary | ICD-10-CM | POA: Diagnosis not present

## 2019-05-16 DIAGNOSIS — Z794 Long term (current) use of insulin: Secondary | ICD-10-CM | POA: Diagnosis not present

## 2019-05-16 DIAGNOSIS — E1165 Type 2 diabetes mellitus with hyperglycemia: Secondary | ICD-10-CM | POA: Diagnosis not present

## 2019-05-16 DIAGNOSIS — E1142 Type 2 diabetes mellitus with diabetic polyneuropathy: Secondary | ICD-10-CM | POA: Diagnosis not present

## 2019-05-18 DIAGNOSIS — L219 Seborrheic dermatitis, unspecified: Secondary | ICD-10-CM | POA: Diagnosis not present

## 2019-05-18 DIAGNOSIS — L700 Acne vulgaris: Secondary | ICD-10-CM | POA: Diagnosis not present

## 2019-05-18 DIAGNOSIS — L821 Other seborrheic keratosis: Secondary | ICD-10-CM | POA: Diagnosis not present

## 2019-05-20 DIAGNOSIS — I5030 Unspecified diastolic (congestive) heart failure: Secondary | ICD-10-CM | POA: Diagnosis not present

## 2019-05-20 DIAGNOSIS — K115 Sialolithiasis: Secondary | ICD-10-CM | POA: Diagnosis not present

## 2019-05-20 DIAGNOSIS — E039 Hypothyroidism, unspecified: Secondary | ICD-10-CM | POA: Diagnosis not present

## 2019-05-20 DIAGNOSIS — Z6838 Body mass index (BMI) 38.0-38.9, adult: Secondary | ICD-10-CM | POA: Diagnosis not present

## 2019-05-20 DIAGNOSIS — M069 Rheumatoid arthritis, unspecified: Secondary | ICD-10-CM | POA: Diagnosis not present

## 2019-05-20 DIAGNOSIS — Z79899 Other long term (current) drug therapy: Secondary | ICD-10-CM | POA: Diagnosis not present

## 2019-05-20 DIAGNOSIS — I480 Paroxysmal atrial fibrillation: Secondary | ICD-10-CM | POA: Diagnosis not present

## 2019-05-20 DIAGNOSIS — E782 Mixed hyperlipidemia: Secondary | ICD-10-CM | POA: Diagnosis not present

## 2019-05-20 DIAGNOSIS — I10 Essential (primary) hypertension: Secondary | ICD-10-CM | POA: Diagnosis not present

## 2019-05-20 DIAGNOSIS — G4733 Obstructive sleep apnea (adult) (pediatric): Secondary | ICD-10-CM | POA: Diagnosis not present

## 2019-05-20 DIAGNOSIS — E118 Type 2 diabetes mellitus with unspecified complications: Secondary | ICD-10-CM | POA: Diagnosis not present

## 2019-05-20 DIAGNOSIS — Z125 Encounter for screening for malignant neoplasm of prostate: Secondary | ICD-10-CM | POA: Diagnosis not present

## 2019-06-10 ENCOUNTER — Encounter (INDEPENDENT_AMBULATORY_CARE_PROVIDER_SITE_OTHER): Payer: Self-pay | Admitting: Otolaryngology

## 2019-06-10 ENCOUNTER — Other Ambulatory Visit: Payer: Self-pay

## 2019-06-10 ENCOUNTER — Ambulatory Visit (INDEPENDENT_AMBULATORY_CARE_PROVIDER_SITE_OTHER): Payer: PPO | Admitting: Otolaryngology

## 2019-06-10 VITALS — Temp 97.7°F

## 2019-06-10 DIAGNOSIS — K115 Sialolithiasis: Secondary | ICD-10-CM

## 2019-06-10 DIAGNOSIS — K1123 Chronic sialoadenitis: Secondary | ICD-10-CM

## 2019-06-10 NOTE — Progress Notes (Signed)
HPI: Joshua Lara is a 74 y.o. male who returns today for evaluation of recurrent right submandibular sialadenitis.  Patient has had another infection of his right semitubular gland since last visit about 8 months ago.  He has had a previous CT scan that shows a large stone within the distal duct on the right side.  He states that he has 2 or 3 infections a year.  We had previously discussed possible excision of only the stone under local anesthesia or possible IV sedation as he has a significant cardiac history and is on Eliquis which would have to be stopped for surgery.  He also has history of insulin-dependent diabetes.  He presently does not smoke but is a former smoker and quit smoking in 1991.  He also has obstructive sleep apnea. He is referred here today by Cyndi Bender, PA.  Past Medical History:  Diagnosis Date  . Arthritis    "hands, back, ankles" (05/11/2014)  . CHF (congestive heart failure) ([place])   . Chronic lower back pain   . Compression fracture of lumbar vertebra ([place])   . Coronary artery disease   . Daily headache    "recently" (05/11/2014)  . Depression    "I might be slight" (05/11/2014)  . GERD (gastroesophageal reflux disease)   . High cholesterol   . Hypertension   . Hypothyroid   . OSA treated with BiPAP   . Pneumonia    "couple times; last time was 03/2011" (05/11/2014)  . Type II diabetes mellitus ([place])    Past Surgical History:  Procedure Laterality Date  . APPENDECTOMY    . CARDIAC CATHETERIZATION  2001; 2009  . CATARACT EXTRACTION W/ INTRAOCULAR LENS IMPLANT Left   . CHOLECYSTECTOMY  1980's   Archie Endo 07/13/2010   . EYE MUSCLE SURGERY Left   . LEFT AND RIGHT HEART CATHETERIZATION WITH CORONARY ANGIOGRAM N/A 05/30/2014   Procedure: LEFT AND RIGHT HEART CATHETERIZATION WITH CORONARY ANGIOGRAM;  Surgeon: Jolaine Artist, MD;  Location: [facility] CATH LAB;  Service: Cardiovascular;  Laterality: N/A;  . LEFT HEART CATH AND CORONARY ANGIOGRAPHY N/A 12/20/2018   Procedure: LEFT HEART CATH AND CORONARY ANGIOGRAPHY and possible intervention;  Surgeon: Adrian Prows, MD;  Location: [place] CV LAB;  Service: Cardiovascular;  Laterality: N/A;  . MASTOIDECTOMY Right 1970's   Archie Endo 07/13/2010  . SHOULDER OPEN ROTATOR CUFF REPAIR Left 07/2010  . SPHINCTEROTOMY    . TONSILLECTOMY AND ADENOIDECTOMY     Social History   Socioeconomic History  . Marital status: Widowed    Spouse name: Not on file  . Number of children: 2  . Years of education: Not on file  . Highest education level: Not on file  Occupational History  . Occupation: retired  Tobacco Use  . Smoking status: Former Smoker    Packs/day: 2.00    Years: 30.00    Pack years: 60.00    Types: Cigarettes    Quit date: 05/12/1990    Years since quitting: 29.0  . Smokeless tobacco: Never Used  Substance and Sexual Activity  . Alcohol use: Yes    Alcohol/week: 0.0 standard drinks    Comment: 05/11/2014 "an occasional beer when out w/people; not often"  . Drug use: No  . Sexual activity: Never  Other Topics Concern  . Not on file  Social History Narrative  . Not on file   Social Determinants of Health   Financial Resource Strain:   . Difficulty of Paying Living Expenses: Not on file  Food  Insecurity:   . Worried About Charity fundraiser in the Last Year: Not on file  . Ran Out of Food in the Last Year: Not on file  Transportation Needs:   . Lack of Transportation (Medical): Not on file  . Lack of Transportation (Non-Medical): Not on file  Physical Activity:   . Days of Exercise per Week: Not on file  . Minutes of Exercise per Session: Not on file  Stress:   . Feeling of Stress : Not on file  Social Connections:   . Frequency of Communication with Friends and Family: Not on file  . Frequency of Social Gatherings with Friends and Family: Not on file  . Attends Religious Services: Not on file  . Active Member of Clubs or Organizations: Not on file  . Attends Archivist  Meetings: Not on file  . Marital Status: Not on file   Family History  Problem Relation Age of Onset  . Kidney failure Mother   . Heart attack Father    Allergies  Allergen Reactions  . Bactrim [Sulfamethoxazole-Trimethoprim] Swelling  . Zantac [Ranitidine Hcl] Anaphylaxis    Has anaphylactic shock--intubated and in ICU x 4 days   . Etanercept Other (See Comments) and Cough    (ENBREL); Patient thinks it contributed to heart issues  . Etanercept     Other reaction(s): Cough (ALLERGY/intolerance), Other (See Comments) (ENBREL); Patient thinks it contributed to heart issues  . Tape Itching    Can only tolerate bandages for less than 24-hr periods Can only tolerate bandages for less than 24-hr periods   Prior to Admission medications   Medication Sig Start Date End Date Taking? Authorizing Provider  apixaban (ELIQUIS) 5 MG TABS tablet Take 1 tablet (5 mg total) by mouth every 12 (twelve) hours. 01/19/19  Yes Miquel Dunn, NP  bimatoprost (LUMIGAN) 0.01 % SOLN Place 1 drop into both eyes at bedtime.  10/17/15  Yes [provider]  Cholecalciferol (VITAMIN D3) 2000 units capsule Take 2,000 Units by mouth daily.   Yes [provider]  doxycycline (VIBRAMYCIN) 100 MG capsule TAKE 1 CAPSULE BY MOUTH TWICE A DAY FOR SKIN INFECTION 01/25/19  Yes [provider]  DULoxetine (CYMBALTA) 60 MG capsule Take 60 mg by mouth daily.  08/15/18  Yes [provider]  furosemide (LASIX) 40 MG tablet Take 1 tablet (40 mg total) by mouth daily as needed. 12/28/18  Yes Miquel Dunn, NP  glucosamine-chondroitin 500-400 MG tablet Take 1 tablet by mouth 3 (three) times daily.   Yes [provider]  HUMULIN R U-500 KWIKPEN 500 UNIT/ML kwikpen Inject 145 Units into the skin 2 (two) times daily. 04/12/19  Yes [provider]  levothyroxine (SYNTHROID, LEVOTHROID) 100 MCG tablet Take 100 mcg by mouth daily.     Yes [provider]  losartan  (COZAAR) 50 MG tablet Take 0.5 tablets (25 mg total) by mouth daily. 12/18/18  Yes Adrian Prows, MD  magnesium oxide (MAG-OX) 400 MG tablet Take 400 mg by mouth daily.   Yes [provider]  metoprolol succinate (TOPROL-XL) 25 MG 24 hr tablet Take 25 mg by mouth daily.   Yes [provider]  nitroGLYCERIN (NITROSTAT) 0.4 MG SL tablet Place 0.4 mg under the tongue every 5 (five) minutes as needed for chest pain.   Yes [provider]  Pitavastatin Calcium (LIVALO) 2 MG TABS Take 1 tablet (2 mg total) by mouth daily. 04/20/19  Yes Miquel Dunn, NP  potassium chloride SA (KLOR-CON) 20 MEQ tablet Take 1 tablet (20 mEq total) by mouth as directed. Take only on days taking Lasix 04/20/19  Yes Miquel Dunn, NP  predniSONE (DELTASONE) 10 MG tablet Take 10 mg by mouth 2 (two) times daily.   Yes [provider]  sotalol (BETAPACE) 80 MG tablet Take 0.5 tablets (40 mg total) by mouth every morning. 1 tablet in the evening 04/04/19  Yes Adrian Prows, MD  vitamin C (ASCORBIC ACID) 500 MG tablet Take 500 mg by mouth daily.   Yes [provider]     Positive ROS: Otherwise negative  All other systems have been reviewed and were otherwise negative with the exception of those mentioned in the HPI and as above.  Physical Exam: Constitutional: Alert, well-appearing, no acute distress Ears: External ears without lesions or tenderness. Ear canals are clear bilaterally with intact, clear TMs.  Nasal: External nose without lesions.. Clear nasal passages Oral: Lips and gums without lesions. Tongue and palate mucosa without lesions. Posterior oropharynx clear.  He does have a palpable stone within the distal duct along the floor the mouth on the right side.  However drainage from the submandibular duct is clear today.  Palpation of the right semitubular gland is significantly enlarged compared to palpation of the left semitubular gland. Neck: No palpable adenopathy  or masses. Respiratory: Breathing comfortably  Skin: No facial/neck lesions or rash noted.  Procedures  Assessment: Chronic right submandibular sialoadenitis.  Large stone within the right submandibular duct. History of recurrent right submandibular sialadenitis.  Plan: Discussed with the patient's concerning options.  We could just remove the stone from the distal duct which is effective in some people and would require less surgical intervention and could be performed under local anesthesia with IV sedation in approximately 30 minutes.  Or could perform excision of the submandibular gland under general anesthesia.  In either situation would require clearance with his cardiologist and stop usage of Eliquis for 3 to 4 days.  In the meantime we will treat the acute infections with antibiotics as needed and stay well-hydrated with drinking plenty of water and use of sialagogues as needed. He will check with his cardiologist next time he has a visit in the next couple of months as to the risk of general anesthesia versus IV sedation.  Reviewed the surgical options with him in the office today he will call us back if he decides to pursue surgical intervention. Gave him a prescription for Keflex 500 mg 3 times daily for 1 week if needed in the meantime.   Radene Journey, MD

## 2019-06-15 DIAGNOSIS — E669 Obesity, unspecified: Secondary | ICD-10-CM | POA: Diagnosis not present

## 2019-06-15 DIAGNOSIS — M5136 Other intervertebral disc degeneration, lumbar region: Secondary | ICD-10-CM | POA: Diagnosis not present

## 2019-06-15 DIAGNOSIS — Z6838 Body mass index (BMI) 38.0-38.9, adult: Secondary | ICD-10-CM | POA: Diagnosis not present

## 2019-06-15 DIAGNOSIS — M1A09X0 Idiopathic chronic gout, multiple sites, without tophus (tophi): Secondary | ICD-10-CM | POA: Diagnosis not present

## 2019-06-15 DIAGNOSIS — M0609 Rheumatoid arthritis without rheumatoid factor, multiple sites: Secondary | ICD-10-CM | POA: Diagnosis not present

## 2019-06-19 ENCOUNTER — Other Ambulatory Visit: Payer: Self-pay | Admitting: Cardiology

## 2019-06-19 DIAGNOSIS — I480 Paroxysmal atrial fibrillation: Secondary | ICD-10-CM

## 2019-06-21 DIAGNOSIS — G4733 Obstructive sleep apnea (adult) (pediatric): Secondary | ICD-10-CM | POA: Diagnosis not present

## 2019-06-23 ENCOUNTER — Other Ambulatory Visit: Payer: Self-pay

## 2019-06-23 DIAGNOSIS — I480 Paroxysmal atrial fibrillation: Secondary | ICD-10-CM

## 2019-06-23 MED ORDER — SOTALOL HCL 80 MG PO TABS: 40.0000 mg | ORAL_TABLET | ORAL | 1 refills | Status: DC

## 2019-06-24 DIAGNOSIS — Z794 Long term (current) use of insulin: Secondary | ICD-10-CM | POA: Diagnosis not present

## 2019-06-24 DIAGNOSIS — E1142 Type 2 diabetes mellitus with diabetic polyneuropathy: Secondary | ICD-10-CM | POA: Diagnosis not present

## 2019-06-24 DIAGNOSIS — E1165 Type 2 diabetes mellitus with hyperglycemia: Secondary | ICD-10-CM | POA: Diagnosis not present

## 2019-06-25 ENCOUNTER — Ambulatory Visit: Payer: PPO | Attending: Internal Medicine

## 2019-06-25 DIAGNOSIS — Z23 Encounter for immunization: Secondary | ICD-10-CM | POA: Insufficient documentation

## 2019-06-25 NOTE — Progress Notes (Signed)
   Covid-19 Vaccination Clinic  Name:  POYRAZ SHAUB    MRN: [id number] DOB: 10/11/1945  06/25/2019  Mr. Fausnaugh was observed post Covid-19 immunization for 30 minutes based on pre-vaccination screening without incidence. He was provided with Vaccine Information Sheet and instruction to access the V-Safe system.   Mr. Benard was instructed to call 911 with any severe reactions post vaccine: Marland Kitchen Difficulty breathing  . Swelling of your face and throat  . A fast heartbeat  . A bad rash all over your body  . Dizziness and weakness    Immunizations Administered    Name Date Dose VIS Date Route   Pfizer COVID-19 Vaccine 06/25/2019  3:18 PM 0.3 mL 04/22/2019 Intramuscular   Manufacturer: [place]   Lot: [id number]   [place]: [id number]

## 2019-07-19 ENCOUNTER — Ambulatory Visit: Payer: PPO | Attending: Internal Medicine

## 2019-07-19 DIAGNOSIS — Z23 Encounter for immunization: Secondary | ICD-10-CM

## 2019-07-19 NOTE — Progress Notes (Signed)
   Covid-19 Vaccination Clinic  Name:  Joshua Lara    MRN: [id number] DOB: 30-May-1945  07/19/2019  Joshua Lara was observed post Covid-19 immunization for 30 minutes based on pre-vaccination screening without incident. He was provided with Vaccine Information Sheet and instruction to access the V-Safe system.   Joshua Lara was instructed to call 911 with any severe reactions post vaccine: Marland Kitchen Difficulty breathing  . Swelling of face and throat  . A fast heartbeat  . A bad rash all over body  . Dizziness and weakness   Immunizations Administered    Name Date Dose VIS Date Route   Pfizer COVID-19 Vaccine 07/19/2019  1:51 PM 0.3 mL 04/22/2019 Intramuscular   Manufacturer: [place]   Lot: [id number]   [place]: [id number]

## 2019-07-22 DIAGNOSIS — Z961 Presence of intraocular lens: Secondary | ICD-10-CM | POA: Diagnosis not present

## 2019-07-22 DIAGNOSIS — E119 Type 2 diabetes mellitus without complications: Secondary | ICD-10-CM | POA: Diagnosis not present

## 2019-07-22 DIAGNOSIS — H53002 Unspecified amblyopia, left eye: Secondary | ICD-10-CM | POA: Diagnosis not present

## 2019-07-22 DIAGNOSIS — H401134 Primary open-angle glaucoma, bilateral, indeterminate stage: Secondary | ICD-10-CM | POA: Diagnosis not present

## 2019-08-03 DIAGNOSIS — H6122 Impacted cerumen, left ear: Secondary | ICD-10-CM | POA: Diagnosis not present

## 2019-08-03 DIAGNOSIS — M069 Rheumatoid arthritis, unspecified: Secondary | ICD-10-CM | POA: Diagnosis not present

## 2019-08-03 DIAGNOSIS — H9202 Otalgia, left ear: Secondary | ICD-10-CM | POA: Diagnosis not present

## 2019-08-03 DIAGNOSIS — E118 Type 2 diabetes mellitus with unspecified complications: Secondary | ICD-10-CM | POA: Diagnosis not present

## 2019-08-03 DIAGNOSIS — R5383 Other fatigue: Secondary | ICD-10-CM | POA: Diagnosis not present

## 2019-08-03 DIAGNOSIS — Z6839 Body mass index (BMI) 39.0-39.9, adult: Secondary | ICD-10-CM | POA: Diagnosis not present

## 2019-08-17 ENCOUNTER — Ambulatory Visit: Payer: Self-pay | Admitting: Physical Medicine & Rehabilitation

## 2019-08-31 ENCOUNTER — Other Ambulatory Visit: Payer: Self-pay

## 2019-09-01 ENCOUNTER — Other Ambulatory Visit: Payer: Self-pay

## 2019-09-01 DIAGNOSIS — I5033 Acute on chronic diastolic (congestive) heart failure: Secondary | ICD-10-CM

## 2019-09-01 MED ORDER — APIXABAN 5 MG PO TABS: 5.0000 mg | ORAL_TABLET | Freq: Two times a day (BID) | ORAL | 3 refills | Status: DC

## 2019-09-01 NOTE — Telephone Encounter (Signed)
Eliquis sent in to [facility]

## 2019-09-09 DIAGNOSIS — G4733 Obstructive sleep apnea (adult) (pediatric): Secondary | ICD-10-CM | POA: Diagnosis not present

## 2019-09-09 DIAGNOSIS — R5383 Other fatigue: Secondary | ICD-10-CM | POA: Diagnosis not present

## 2019-09-09 DIAGNOSIS — R05 Cough: Secondary | ICD-10-CM | POA: Diagnosis not present

## 2019-09-09 DIAGNOSIS — E662 Morbid (severe) obesity with alveolar hypoventilation: Secondary | ICD-10-CM | POA: Diagnosis not present

## 2019-09-14 DIAGNOSIS — M5136 Other intervertebral disc degeneration, lumbar region: Secondary | ICD-10-CM | POA: Diagnosis not present

## 2019-09-14 DIAGNOSIS — M1A09X0 Idiopathic chronic gout, multiple sites, without tophus (tophi): Secondary | ICD-10-CM | POA: Diagnosis not present

## 2019-09-14 DIAGNOSIS — Z6838 Body mass index (BMI) 38.0-38.9, adult: Secondary | ICD-10-CM | POA: Diagnosis not present

## 2019-09-14 DIAGNOSIS — E669 Obesity, unspecified: Secondary | ICD-10-CM | POA: Diagnosis not present

## 2019-09-14 DIAGNOSIS — M0609 Rheumatoid arthritis without rheumatoid factor, multiple sites: Secondary | ICD-10-CM | POA: Diagnosis not present

## 2019-09-17 DIAGNOSIS — G4733 Obstructive sleep apnea (adult) (pediatric): Secondary | ICD-10-CM | POA: Diagnosis not present

## 2019-09-23 DIAGNOSIS — E1142 Type 2 diabetes mellitus with diabetic polyneuropathy: Secondary | ICD-10-CM | POA: Diagnosis not present

## 2019-09-23 DIAGNOSIS — Z794 Long term (current) use of insulin: Secondary | ICD-10-CM | POA: Diagnosis not present

## 2019-09-23 DIAGNOSIS — E1165 Type 2 diabetes mellitus with hyperglycemia: Secondary | ICD-10-CM | POA: Diagnosis not present

## 2019-10-19 ENCOUNTER — Ambulatory Visit: Payer: Medicare Other | Admitting: Cardiology

## 2019-10-21 DIAGNOSIS — G4733 Obstructive sleep apnea (adult) (pediatric): Secondary | ICD-10-CM | POA: Diagnosis not present

## 2019-10-21 DIAGNOSIS — E662 Morbid (severe) obesity with alveolar hypoventilation: Secondary | ICD-10-CM | POA: Diagnosis not present

## 2019-10-21 DIAGNOSIS — R05 Cough: Secondary | ICD-10-CM | POA: Diagnosis not present

## 2019-10-21 DIAGNOSIS — R5383 Other fatigue: Secondary | ICD-10-CM | POA: Diagnosis not present

## 2019-10-24 ENCOUNTER — Other Ambulatory Visit: Payer: Self-pay | Admitting: Cardiology

## 2019-10-24 DIAGNOSIS — I480 Paroxysmal atrial fibrillation: Secondary | ICD-10-CM

## 2019-10-24 NOTE — Telephone Encounter (Signed)
Refill request

## 2019-10-28 ENCOUNTER — Ambulatory Visit: Payer: PPO | Admitting: Cardiology

## 2019-10-28 ENCOUNTER — Encounter: Payer: Self-pay | Admitting: Cardiology

## 2019-10-28 ENCOUNTER — Other Ambulatory Visit: Payer: Self-pay

## 2019-10-28 VITALS — BP 110/70 | HR 88 | Resp 16 | Ht 70.0 in | Wt 264.2 lb

## 2019-10-28 DIAGNOSIS — I10 Essential (primary) hypertension: Secondary | ICD-10-CM

## 2019-10-28 DIAGNOSIS — Z79899 Other long term (current) drug therapy: Secondary | ICD-10-CM | POA: Diagnosis not present

## 2019-10-28 DIAGNOSIS — R0602 Shortness of breath: Secondary | ICD-10-CM | POA: Diagnosis not present

## 2019-10-28 DIAGNOSIS — Z87891 Personal history of nicotine dependence: Secondary | ICD-10-CM | POA: Diagnosis not present

## 2019-10-28 DIAGNOSIS — E782 Mixed hyperlipidemia: Secondary | ICD-10-CM | POA: Diagnosis not present

## 2019-10-28 DIAGNOSIS — I480 Paroxysmal atrial fibrillation: Secondary | ICD-10-CM

## 2019-10-28 DIAGNOSIS — G4733 Obstructive sleep apnea (adult) (pediatric): Secondary | ICD-10-CM | POA: Diagnosis not present

## 2019-10-28 DIAGNOSIS — I5022 Chronic systolic (congestive) heart failure: Secondary | ICD-10-CM | POA: Diagnosis not present

## 2019-10-28 DIAGNOSIS — Z7901 Long term (current) use of anticoagulants: Secondary | ICD-10-CM | POA: Diagnosis not present

## 2019-10-28 DIAGNOSIS — Z6837 Body mass index (BMI) 37.0-37.9, adult: Secondary | ICD-10-CM | POA: Diagnosis not present

## 2019-10-28 NOTE — Progress Notes (Signed)
Joshua Lara Date of Birth: 1946-04-09 MRN: [id number] Primary Care Provider:[name], [name] Former Cardiology Providers: Jeri Lager, APRN, FNP-C Primary Cardiologist: Rex Kras, DO, [facility] (established care 10/28/2019)  Date: 10/28/19 Last Visit: 04/20/2019  Chief Complaint  Patient presents with   Paroxysmal Atrial Fibrillation   Follow-up    6 month    HPI  Joshua Lara is a 74 y.o.  male who presents to the office with a chief complaint of "Paroxysmal atrial fibrillation management." Patient's past medical history and cardiovascular risk factors include: Paroxysmal atrial fibrillation, oral anticoagulation, long-term antiarrhythmic medication, heart failure with reduced EF, stage C, NYHA class II, hypertension, hyperlipidemia, OSA on CPAP, diabetes mellitus, obesity due to excess calories, advanced age..  Patient was originally under the care of Jeri Lager, APRN, FNP-C and I am seeing him for the first time for the above-mentioned chief complaint.   Since last office visit patient states that he has been doing well overall.  However, he does get short of breath with effort related activities.  He feels that he gets worn out more quicker with physical exertion.  He denies any chest pain at rest or with effort related activities.  He does not use any sublingual nitroglycerin tablets since last seen in the office.  He is currently on sotalol for rhythm control given his underlying paroxysmal atrial fibrillation and mildly reduced left ventricular systolic function as per his last echocardiogram.  Tolerating the medication well without any significant side effects or intolerances.  Patient does not endorse any evidence of bleeding and is currently on Eliquis twice a day.  At the last office visit he was also recommended to start Livalo given his lipid profile.  He has been intolerant to multiple statin therapies in the past.  He is tolerating Livalo well.    ALLERGIES: Allergies  Allergen Reactions   Bactrim [Sulfamethoxazole-Trimethoprim] Swelling   Zantac [Ranitidine Hcl] Anaphylaxis    Has anaphylactic shock--intubated and in ICU x 4 days    Etanercept Other (See Comments) and Cough    (ENBREL); Patient thinks it contributed to heart issues   Etanercept     Other reaction(s): Cough (ALLERGY/intolerance), Other (See Comments) (ENBREL); Patient thinks it contributed to heart issues   Tape Itching    Can only tolerate bandages for less than 24-hr periods Can only tolerate bandages for less than 24-hr periods     MEDICATION LIST PRIOR TO VISIT: Current Outpatient Medications on File Prior to Visit  Medication Sig Dispense Refill   apixaban (ELIQUIS) 5 MG TABS tablet Take 1 tablet (5 mg total) by mouth every 12 (twelve) hours. 60 tablet 3   bimatoprost (LUMIGAN) 0.01 % SOLN Place 1 drop into both eyes at bedtime.      Cholecalciferol (VITAMIN D3) 2000 units capsule Take 2,000 Units by mouth daily.     folic acid (FOLVITE) 1 MG tablet Take 1 mg by mouth daily.     furosemide (LASIX) 20 MG tablet Take 20 mg by mouth daily.     glucosamine-chondroitin 500-400 MG tablet Take 1 tablet by mouth 3 (three) times daily.     HUMULIN R U-500 KWIKPEN 500 UNIT/ML kwikpen Inject 145 Units into the skin 2 (two) times daily.     JARDIANCE 10 MG TABS tablet Take 10 mg by mouth daily.     levothyroxine (SYNTHROID, LEVOTHROID) 100 MCG tablet Take 100 mcg by mouth daily.       losartan (COZAAR) 50 MG tablet Take 0.5 tablets (  25 mg total) by mouth daily.     nitroGLYCERIN (NITROSTAT) 0.4 MG SL tablet Place 0.4 mg under the tongue every 5 (five) minutes as needed for chest pain.      Pitavastatin Calcium (LIVALO) 2 MG TABS Take 1 tablet (2 mg total) by mouth daily. 30 tablet 3   potassium chloride SA (KLOR-CON) 20 MEQ tablet Take 1 tablet (20 mEq total) by mouth as directed. Take only on days taking Lasix 30 tablet 2   sotalol (BETAPACE)  80 MG tablet TAKE 0.5 TABLETS BY MOUTH EVERY MORNING AND TAKE 1 TABLET IN THE EVENING 60 tablet 1   TRULICITY 1.61 WR/6.0AV SOPN INJECT 0.75 MG ONCE A WEEK SUBCUTANEOUS 30 DAYS     No current facility-administered medications on file prior to visit.    PAST MEDICAL HISTORY: Past Medical History:  Diagnosis Date   Arthritis    "hands, back, ankles" (05/11/2014)   CHF (congestive heart failure) (HCC)    Chronic lower back pain    Compression fracture of lumbar vertebra (HCC)    Coronary artery disease    Daily headache    "recently" (05/11/2014)   Depression    "I might be slight" (05/11/2014)   GERD (gastroesophageal reflux disease)    High cholesterol    Hypertension    Hypothyroid    OSA treated with BiPAP    Pneumonia    "couple times; last time was 03/2011" (05/11/2014)   Type II diabetes mellitus ([place])     PAST SURGICAL HISTORY: Past Surgical History:  Procedure Laterality Date   APPENDECTOMY     CARDIAC CATHETERIZATION  2001; 2009   CATARACT EXTRACTION W/ INTRAOCULAR LENS IMPLANT Left    CHOLECYSTECTOMY  1980's   Archie Endo 07/13/2010    EYE MUSCLE SURGERY Left    LEFT AND RIGHT HEART CATHETERIZATION WITH CORONARY ANGIOGRAM N/A 05/30/2014   Procedure: LEFT AND RIGHT HEART CATHETERIZATION WITH CORONARY ANGIOGRAM;  Surgeon: Jolaine Artist, MD;  Location: [facility] CATH LAB;  Service: Cardiovascular;  Laterality: N/A;   LEFT HEART CATH AND CORONARY ANGIOGRAPHY N/A 12/20/2018   Procedure: LEFT HEART CATH AND CORONARY ANGIOGRAPHY and possible intervention;  Surgeon: Adrian Prows, MD;  Location: [facility] CV LAB;  Service: Cardiovascular;  Laterality: N/A;   MASTOIDECTOMY Right 1970's   Archie Endo 07/13/2010   SHOULDER OPEN ROTATOR CUFF REPAIR Left 07/2010   SPHINCTEROTOMY     TONSILLECTOMY AND ADENOIDECTOMY      FAMILY HISTORY: The patient's family history includes Heart attack in his father; Kidney failure in his mother.   SOCIAL HISTORY:  The patient   reports that he quit smoking about 29 years ago. His smoking use included cigarettes. He has a 60.00 pack-year smoking history. He has never used smokeless tobacco. He reports current alcohol use. He reports that he does not use drugs.  Review of Systems  Constitutional: Positive for malaise/fatigue. Negative for chills and fever.  HENT: Negative for hoarse voice and nosebleeds.   Eyes: Negative for discharge, double vision and pain.  Cardiovascular: Positive for dyspnea on exertion. Negative for chest pain, claudication, leg swelling, near-syncope, orthopnea, palpitations, paroxysmal nocturnal dyspnea and syncope.  Respiratory: Negative for hemoptysis and shortness of breath.   Musculoskeletal: Negative for muscle cramps and myalgias.  Gastrointestinal: Negative for abdominal pain, constipation, diarrhea, hematemesis, hematochezia, melena, nausea and vomiting.  Neurological: Negative for dizziness and light-headedness.    PHYSICAL EXAM: Vitals with BMI 10/28/2019 04/20/2019 12/28/2018  Height 5\' 10"  5\' 9"  5\' 10"   Weight 264  lbs 3 oz 255 lbs 5 oz 253 lbs 2 oz  BMI 37.91 06.30 16.01  Systolic 093 235 573  Diastolic 70 77 69  Pulse 88 76 88   CONSTITUTIONAL: Appropriate Caucasian gentleman, hemodynamically stable, no acute distress. SKIN: Skin is warm and dry. No rash noted. No cyanosis. No pallor. No jaundice HEAD: Normocephalic and atraumatic.  EYES: No scleral icterus MOUTH/THROAT: Moist oral membranes.  NECK: No JVD present. No thyromegaly noted. No carotid bruits  LYMPHATIC: No visible cervical adenopathy.  CHEST Normal respiratory effort. No intercostal retractions  LUNGS: Clear to auscultation bilaterally. No stridor. No wheezes. No rales.  CARDIOVASCULAR: Regular rate and rhythm, positive S1-S2, no murmurs rubs or gallops appreciated. ABDOMINAL: Obese, soft, nontender, nondistended, positive bowel sounds all 4 quadrants. No apparent ascites.  EXTREMITIES: No peripheral edema   HEMATOLOGIC: No significant bruising NEUROLOGIC: Oriented to person, place, and time. Nonfocal. Normal muscle tone.  PSYCHIATRIC: Normal mood and affect. Normal behavior. Cooperative  CARDIAC DATABASE: EKG: 10/28/2019: Normal sinus rhythm, 94 bpm, rightward axis, first-degree AV block, poor R wave progression, consider old anterior infarct.   Echocardiogram: 12/19/2018: 45-50%, indeterminate diastolic filling, elevated LAP, moderately dilated left atrium, mildly dilated right atrium, mildly dilated right ventricle.   Stress Testing:  NA  Heart Catheterization: 12/20/2018: Left ventricle is borderline dilated. Mild decrease in LV systolic function with global hypokinesis, EF 45%. Normal LVEDP. Minimal luminal irregularity, no coronary artery disease. Right dominant circulation.   LABORATORY DATA: CBC Latest Ref Rng & Units 12/16/2018 11/05/2016 11/04/2016  WBC 4.0 - 10.5 K/uL 8.2 10.6(H) 10.5  Hemoglobin 13.0 - 17.0 g/dL 14.1 11.8(L) 12.2(L)  Hematocrit 39 - 52 % 40.4 36.0(L) 36.3(L)  Platelets 150 - 400 K/uL 194 224 211    CMP Latest Ref Rng & Units 12/19/2018 12/17/2018 12/16/2018  Glucose 70 - 99 mg/dL 144(H) 219(H) 333(H)  BUN 8 - 23 mg/dL 20 10 10   Creatinine 0.61 - 1.24 mg/dL 1.01 0.99 0.97  Sodium 135 - 145 mmol/L 136 136 135  Potassium 3.5 - 5.1 mmol/L 3.8 3.1(L) 3.6  Chloride 98 - 111 mmol/L 98 97(L) 96(L)  CO2 22 - 32 mmol/L 26 27 27   Calcium 8.9 - 10.3 mg/dL 9.9 9.8 9.9  Total Protein 6.5 - 8.1 g/dL - 8.2(H) -  Total Bilirubin 0.3 - 1.2 mg/dL - 2.0(H) -  Alkaline Phos 38 - 126 U/L - 112 -  AST 15 - 41 U/L - 24 -  ALT 0 - 44 U/L - 27 -    Lipid Panel     Component Value Date/Time   CHOL 142 12/17/2018 0420   TRIG 109 12/17/2018 0420   HDL 59 12/17/2018 0420   CHOLHDL 2.4 12/17/2018 0420   VLDL 22 12/17/2018 0420   LDLCALC 61 12/17/2018 0420    Lab Results  Component Value Date   HGBA1C 6.4 (H) 12/17/2018   HGBA1C 6.2 (H) 05/11/2014   HGBA1C 7.3 (H) 04/05/2011    No components found for: NTPROBNP Lab Results  Component Value Date   TSH 3.819 12/17/2018   TSH 1.309 05/11/2014   TSH 1.580 04/05/2011    Cardiac Panel (last 3 results) No results for input(s): CKTOTAL, CKMB, TROPONINIHS, RELINDX in the last 72 hours.  IMPRESSION:    ICD-10-CM   1. Paroxysmal atrial fibrillation (HCC)  I48.0 EKG 12-Lead    PCV MYOCARDIAL PERFUSION WITH LEXISCAN  2. Long term (current) use of anticoagulants  Z79.01   3. Long term current use of antiarrhythmic drug  K09.381 Basic metabolic panel    Magnesium    Pro b natriuretic peptide (BNP)  4. Chronic HFrEF (heart failure with reduced ejection fraction) (HCC)  I50.22 PCV ECHOCARDIOGRAM COMPLETE    PCV MYOCARDIAL PERFUSION WITH LEXISCAN  5. Essential hypertension  I10   6. Mixed hyperlipidemia  E78.2 Lipid Panel With LDL/HDL Ratio  7. OSA treated with BiPAP  G47.33   8. Former smoker  Z87.891   55. Class 2 severe obesity due to excess calories with serious comorbidity and body mass index (BMI) of 37.0 to 37.9 in adult (HCC)  E66.01    Z68.37   10. Shortness of breath  R06.02 PCV MYOCARDIAL PERFUSION WITH LEXISCAN     RECOMMENDATIONS: Joshua Lara is a 74 y.o. male whose past medical history and cardiovascular risk factors include: Paroxysmal atrial fibrillation, oral anticoagulation, long-term antiarrhythmic medication, heart failure with reduced EF, stage C, NYHA class II, hypertension, hyperlipidemia, OSA on CPAP, diabetes mellitus, obesity due to excess calories, advanced age.  Paroxysmal atrial fibrillation:  CHA2DS2-VASc SCORE is 5 which correlates to 6.7 % risk of stroke per year.  Rate/rhythm control: Sotalol.  Thromboembolic prophylaxis: Eliquis.  EKG as noted above.   Long-term oral anticoagulation:  Indication: Paroxysmal atrial fibrillation.  Patient does not endorse any evidence of bleeding.  Risks, benefits, and alternatives to oral anticoagulation reemphasized at today's office  visit.  Long-term antiarrhythmic medications: We will check BMP and Mg level.   Chronic HFrEF, stage C, NYHA class II/III:  Continue current medical therapy.  Patient has significant amount of fluid intake on a daily basis  and he is asked to decrease the fluid intake.  Check BMP, BNP, Mg level.   Recommend daily weight check, strict I/O's  Fluid restriction to <2L per day, Na restriction < 1.5g per day  Not starting Entresto secondary to soft blood pressures.  Echocardiogram will be ordered to evaluate for structural heart disease and left ventricular systolic function.  Nuclear stress test recommended to evaluate for reversible ischemia.  Mixed hyperlipidemia:  Patient is tolerating Livalo well without any side effects or intolerances.  Patient was previously on pravastatin which was discontinued secondary to myalgias.    Repeat fasting lipid profile.  FINAL MEDICATION LIST END OF ENCOUNTER: No orders of the defined types were placed in this encounter.    Current Outpatient Medications:    apixaban (ELIQUIS) 5 MG TABS tablet, Take 1 tablet (5 mg total) by mouth every 12 (twelve) hours., Disp: 60 tablet, Rfl: 3   bimatoprost (LUMIGAN) 0.01 % SOLN, Place 1 drop into both eyes at bedtime. , Disp: , Rfl:    Cholecalciferol (VITAMIN D3) 2000 units capsule, Take 2,000 Units by mouth daily., Disp: , Rfl:    folic acid (FOLVITE) 1 MG tablet, Take 1 mg by mouth daily., Disp: , Rfl:    furosemide (LASIX) 20 MG tablet, Take 20 mg by mouth daily., Disp: , Rfl:    glucosamine-chondroitin 500-400 MG tablet, Take 1 tablet by mouth 3 (three) times daily., Disp: , Rfl:    HUMULIN R U-500 KWIKPEN 500 UNIT/ML kwikpen, Inject 145 Units into the skin 2 (two) times daily., Disp: , Rfl:    JARDIANCE 10 MG TABS tablet, Take 10 mg by mouth daily., Disp: , Rfl:    levothyroxine (SYNTHROID, LEVOTHROID) 100 MCG tablet, Take 100 mcg by mouth daily.  , Disp: , Rfl:    losartan (COZAAR) 50  MG tablet, Take 0.5 tablets (25 mg total) by mouth daily., Disp: ,  Rfl:    nitroGLYCERIN (NITROSTAT) 0.4 MG SL tablet, Place 0.4 mg under the tongue every 5 (five) minutes as needed for chest pain. , Disp: , Rfl:    Pitavastatin Calcium (LIVALO) 2 MG TABS, Take 1 tablet (2 mg total) by mouth daily., Disp: 30 tablet, Rfl: 3   potassium chloride SA (KLOR-CON) 20 MEQ tablet, Take 1 tablet (20 mEq total) by mouth as directed. Take only on days taking Lasix, Disp: 30 tablet, Rfl: 2   sotalol (BETAPACE) 80 MG tablet, TAKE 0.5 TABLETS BY MOUTH EVERY MORNING AND TAKE 1 TABLET IN THE EVENING, Disp: 60 tablet, Rfl: 1   TRULICITY 8.88 KC/0.0LK SOPN, INJECT 0.75 MG ONCE A WEEK SUBCUTANEOUS 30 DAYS, Disp: , Rfl:   Orders Placed This Encounter  Procedures   Basic metabolic panel   Magnesium   Pro b natriuretic peptide (BNP)   Lipid Panel With LDL/HDL Ratio   PCV MYOCARDIAL PERFUSION WITH LEXISCAN   EKG 12-Lead   PCV ECHOCARDIOGRAM COMPLETE   --Continue cardiac medications as reconciled in final medication list. --Return for  HF and AFifb follow up . Or sooner if needed. --Continue follow-up with your primary care physician regarding the management of your other chronic comorbid conditions.  Patient's questions and concerns were addressed to his satisfaction. He voices understanding of the instructions provided during this encounter.   This note was created using a voice recognition software as a result there may be grammatical errors inadvertently enclosed that do not reflect the nature of this encounter. Every attempt is made to correct such errors.  Rex Kras, [place], [facility]  Pager: [phone] Office: [phone]

## 2019-10-31 DIAGNOSIS — I5030 Unspecified diastolic (congestive) heart failure: Secondary | ICD-10-CM | POA: Diagnosis not present

## 2019-10-31 DIAGNOSIS — E782 Mixed hyperlipidemia: Secondary | ICD-10-CM | POA: Diagnosis not present

## 2019-11-14 NOTE — Progress Notes (Signed)
External Labs: Collected: 10/31/2019 Creatinine 0.84 mg/dL. eGFR: 87 mL/min per 1.73 m Lipid profile: Total cholesterol 138, triglycerides 265, HDL 37, LDL 48, non-HDL 59. BNP: 9.5

## 2019-11-15 ENCOUNTER — Telehealth: Payer: Self-pay

## 2019-11-15 NOTE — Telephone Encounter (Signed)
-----   Message from [place], [place] sent at 11/14/2019  3:41 PM EDT ----- Please inform the patient that the labs have been reviewed.  His triglyceride levels are elevated which we will address at the next office visit.  Please remind him to bring all medication bottles.

## 2019-11-15 NOTE — Telephone Encounter (Signed)
LVM for patient to call back regarding test results.

## 2019-11-18 ENCOUNTER — Telehealth: Payer: Self-pay

## 2019-11-18 MED ORDER — LIVALO 2 MG PO TABS: 2.0000 mg | ORAL_TABLET | Freq: Every day | ORAL | 6 refills | Status: DC

## 2019-11-18 NOTE — Telephone Encounter (Signed)
Discussed lab results with patient. He verbalized understanding.

## 2019-11-18 NOTE — Telephone Encounter (Signed)
Less likely, no new meds started. Have him see is PCP or dermatologist for more guidance.

## 2019-11-18 NOTE — Telephone Encounter (Signed)
Patient says that he has developed a rash on his arms. Wants to know if any of his heart medications can cause a rash.

## 2019-11-18 NOTE — Telephone Encounter (Signed)
Relayed information to pt. Patient voiced understanding.

## 2019-11-23 ENCOUNTER — Encounter (HOSPITAL_COMMUNITY): Payer: Self-pay | Admitting: Emergency Medicine

## 2019-11-23 ENCOUNTER — Ambulatory Visit: Payer: BLUE CROSS/BLUE SHIELD

## 2019-11-23 ENCOUNTER — Encounter: Payer: Self-pay | Admitting: Cardiology

## 2019-11-23 ENCOUNTER — Emergency Department (HOSPITAL_COMMUNITY)
Admission: EM | Admit: 2019-11-23 | Discharge: 2019-11-23 | Disposition: A | Discharge status: Home / Self Care | Payer: PPO | Attending: Emergency Medicine | Admitting: Emergency Medicine

## 2019-11-23 ENCOUNTER — Other Ambulatory Visit: Payer: Self-pay | Admitting: Cardiology

## 2019-11-23 ENCOUNTER — Ambulatory Visit: Payer: PPO

## 2019-11-23 ENCOUNTER — Other Ambulatory Visit: Payer: Self-pay

## 2019-11-23 ENCOUNTER — Ambulatory Visit: Payer: PPO | Admitting: Cardiology

## 2019-11-23 VITALS — BP 94/60 | HR 85 | Resp 15 | Ht 70.0 in | Wt 266.0 lb

## 2019-11-23 DIAGNOSIS — Z7901 Long term (current) use of anticoagulants: Secondary | ICD-10-CM

## 2019-11-23 DIAGNOSIS — R5383 Other fatigue: Secondary | ICD-10-CM | POA: Insufficient documentation

## 2019-11-23 DIAGNOSIS — I10 Essential (primary) hypertension: Secondary | ICD-10-CM

## 2019-11-23 DIAGNOSIS — R0609 Other forms of dyspnea: Secondary | ICD-10-CM | POA: Diagnosis not present

## 2019-11-23 DIAGNOSIS — E782 Mixed hyperlipidemia: Secondary | ICD-10-CM

## 2019-11-23 DIAGNOSIS — E039 Hypothyroidism, unspecified: Secondary | ICD-10-CM | POA: Diagnosis not present

## 2019-11-23 DIAGNOSIS — R42 Dizziness and giddiness: Secondary | ICD-10-CM

## 2019-11-23 DIAGNOSIS — R0602 Shortness of breath: Secondary | ICD-10-CM

## 2019-11-23 DIAGNOSIS — I480 Paroxysmal atrial fibrillation: Secondary | ICD-10-CM | POA: Insufficient documentation

## 2019-11-23 DIAGNOSIS — Z7984 Long term (current) use of oral hypoglycemic drugs: Secondary | ICD-10-CM | POA: Insufficient documentation

## 2019-11-23 DIAGNOSIS — Z79890 Hormone replacement therapy (postmenopausal): Secondary | ICD-10-CM | POA: Diagnosis not present

## 2019-11-23 DIAGNOSIS — I2510 Atherosclerotic heart disease of native coronary artery without angina pectoris: Secondary | ICD-10-CM | POA: Diagnosis not present

## 2019-11-23 DIAGNOSIS — Z87891 Personal history of nicotine dependence: Secondary | ICD-10-CM

## 2019-11-23 DIAGNOSIS — G4733 Obstructive sleep apnea (adult) (pediatric): Secondary | ICD-10-CM

## 2019-11-23 DIAGNOSIS — Z79899 Other long term (current) drug therapy: Secondary | ICD-10-CM

## 2019-11-23 DIAGNOSIS — I5033 Acute on chronic diastolic (congestive) heart failure: Secondary | ICD-10-CM | POA: Diagnosis not present

## 2019-11-23 DIAGNOSIS — I110 Hypertensive heart disease with heart failure: Secondary | ICD-10-CM | POA: Diagnosis not present

## 2019-11-23 DIAGNOSIS — K921 Melena: Secondary | ICD-10-CM

## 2019-11-23 DIAGNOSIS — I5022 Chronic systolic (congestive) heart failure: Secondary | ICD-10-CM | POA: Diagnosis not present

## 2019-11-23 DIAGNOSIS — R0600 Dyspnea, unspecified: Secondary | ICD-10-CM

## 2019-11-23 DIAGNOSIS — Z6838 Body mass index (BMI) 38.0-38.9, adult: Secondary | ICD-10-CM

## 2019-11-23 LAB — CBC
HCT: 44 % (ref 39.0–52.0)
Hemoglobin: 14.9 g/dL (ref 13.0–17.0)
MCH: 30.7 pg (ref 26.0–34.0)
MCHC: 33.9 g/dL (ref 30.0–36.0)
MCV: 90.5 fL (ref 80.0–100.0)
Platelets: 167 10*3/uL (ref 150–400)
RBC: 4.86 MIL/uL (ref 4.22–5.81)
RDW: 14.8 % (ref 11.5–15.5)
WBC: 5.5 10*3/uL (ref 4.0–10.5)
nRBC: 0 % (ref 0.0–0.2)

## 2019-11-23 LAB — COMPREHENSIVE METABOLIC PANEL
ALT: 46 U/L — ABNORMAL HIGH (ref 0–44)
AST: 41 U/L (ref 15–41)
Albumin: 3.9 g/dL (ref 3.5–5.0)
Alkaline Phosphatase: 99 U/L (ref 38–126)
Anion gap: 11 (ref 5–15)
BUN: 16 mg/dL (ref 8–23)
CO2: 23 mmol/L (ref 22–32)
Calcium: 9.7 mg/dL (ref 8.9–10.3)
Chloride: 102 mmol/L (ref 98–111)
Creatinine, Ser: 0.94 mg/dL (ref 0.61–1.24)
GFR calc Af Amer: >60 mL/min (ref >60)
GFR calc non Af Amer: >60 mL/min (ref >60)
Glucose, Bld: 222 mg/dL — ABNORMAL HIGH (ref 70–99)
Potassium: 4.1 mmol/L (ref 3.5–5.1)
Sodium: 136 mmol/L (ref 135–145)
Total Bilirubin: 0.9 mg/dL (ref 0.3–1.2)
Total Protein: 7.2 g/dL (ref 6.5–8.1)

## 2019-11-23 LAB — TYPE AND SCREEN
ABO/RH(D): B POS
Antibody Screen: NEGATIVE

## 2019-11-23 LAB — POC OCCULT BLOOD, ED: Fecal Occult Bld: NEGATIVE

## 2019-11-23 MED ORDER — PANTOPRAZOLE SODIUM 40 MG PO TBEC
40.0000 mg | DELAYED_RELEASE_TABLET | Freq: Two times a day (BID) | ORAL | 0 refills | Status: DC
Start: 2019-11-23 — End: 2019-11-23

## 2019-11-23 NOTE — Progress Notes (Signed)
Patient came in for stress test and mentioned to the nuclear tech he has been having black tarry stools.  Check CBC, Mg, BMP.  Hold Eliquis for now and he understands he is at higher thromboembolic risk as a result.  He is asked to call his PCP for additional workup .  He also mention that he cannot afford Livalo, samples provided and will try to do a prior auth.   ST

## 2019-11-23 NOTE — Discharge Instructions (Signed)
Please follow-up with your cardiologist tomorrow to reschedule your appointment and be seen for further evaluation.  You can always return to the emergency department if your symptoms worsen.  It was a pleasure to meet you.

## 2019-11-23 NOTE — Progress Notes (Signed)
Joshua Lara Date of Birth: 1945-07-24 MRN: [id number] Primary Care Provider:[name], [name] Former Cardiology Providers: Jeri Lager, APRN, FNP-C Primary Cardiologist: Rex Kras, DO, [facility] (established care 10/28/2019)  Date: 11/23/19 Last Visit: 10/28/2019  Chief Complaint  Patient presents with   Follow-up    1 month   Melena    HPI  Joshua Lara is a 74 y.o.  male who presents to the office with a chief complaint of "black stools." Patient's past medical history and cardiovascular risk factors include: Paroxysmal atrial fibrillation, oral anticoagulation, long-term antiarrhythmic medication, heart failure with reduced EF, stage C, NYHA class II, hypertension, hyperlipidemia, OSA on CPAP, diabetes mellitus, obesity due to excess calories, advanced age..  Patient was originally under the care of Jeri Lager, APRN, FNP-C and I started seeing him in June 2021 for Afib management.   At last office visit he was having symptoms of effort related dyspnea and therefore recommend an ischemic evaluation.  He was scheduled for nuclear stress test today.  However the nuclear tech noted that his blood pressure was low and patient noted that he has been having black tarry stools and diarrhea for the last 2 weeks at least.  Patient was worked in to see me in the office on more urgent basis.  Patient states that he has been having black tarry stools for about 2 weeks in duration.  The frequency of black tarry stools have been getting progressively worse and they are foul-smelling.  He denies any bright red blood per rectum.  Patient states that he has no prior history of gastrointestinal bleedin.  But remembers having a remote history of ulcers.  He feels more tired and fatigued since last office visit and more short of breath.  At times he gets lightheaded and dizzy when he has to change positions.   For reasons unknown he did not call the office or his primary care provider for  further directions given the symptoms noted above.  In addition, patient states that he is unable to afford Livalo.   ALLERGIES: Allergies  Allergen Reactions   Bactrim [Sulfamethoxazole-Trimethoprim] Swelling   Zantac [Ranitidine Hcl] Anaphylaxis    Has anaphylactic shock--intubated and in ICU x 4 days    Etanercept Other (See Comments) and Cough    (ENBREL); Patient thinks it contributed to heart issues   Etanercept     Other reaction(s): Cough (ALLERGY/intolerance), Other (See Comments) (ENBREL); Patient thinks it contributed to heart issues   Tape Itching    Can only tolerate bandages for less than 24-hr periods Can only tolerate bandages for less than 24-hr periods     MEDICATION LIST PRIOR TO VISIT: Current Outpatient Medications on File Prior to Visit  Medication Sig Dispense Refill   BD PEN NEEDLE NANO 2ND GEN 32G X 4 MM MISC      bimatoprost (LUMIGAN) 0.01 % SOLN Place 1 drop into both eyes at bedtime.      Cholecalciferol (VITAMIN D3) 2000 units capsule Take 2,000 Units by mouth daily.     folic acid (FOLVITE) 1 MG tablet Take 1 mg by mouth daily.     glucosamine-chondroitin 500-400 MG tablet Take 1 tablet by mouth 3 (three) times daily.     HUMULIN R U-500 KWIKPEN 500 UNIT/ML kwikpen Inject 145 Units into the skin 2 (two) times daily.     JARDIANCE 10 MG TABS tablet Take 10 mg by mouth daily.     levothyroxine (SYNTHROID, LEVOTHROID) 100 MCG tablet Take 100 mcg  by mouth daily.       losartan (COZAAR) 50 MG tablet Take 0.5 tablets (25 mg total) by mouth daily. (Patient taking differently: Take 50 mg by mouth daily. )     methotrexate 2.5 MG tablet Take 5 mg by mouth once a week. 5mg  in Morning, 5mg  at night     nitroGLYCERIN (NITROSTAT) 0.4 MG SL tablet Place 0.4 mg under the tongue every 5 (five) minutes as needed for chest pain.      Pitavastatin Calcium (LIVALO) 2 MG TABS Take 1 tablet (2 mg total) by mouth daily. 30 tablet 6   potassium chloride SA  (KLOR-CON) 20 MEQ tablet Take 1 tablet (20 mEq total) by mouth as directed. Take only on days taking Lasix 30 tablet 2   sotalol (BETAPACE) 80 MG tablet TAKE 0.5 TABLETS BY MOUTH EVERY MORNING AND TAKE 1 TABLET IN THE EVENING 60 tablet 1   TRULICITY 1.02 VO/5.3GU SOPN INJECT 0.75 MG ONCE A WEEK SUBCUTANEOUS 30 DAYS     No current facility-administered medications on file prior to visit.    PAST MEDICAL HISTORY: Past Medical History:  Diagnosis Date   Arthritis    "hands, back, ankles" (05/11/2014)   CHF (congestive heart failure) (HCC)    Chronic lower back pain    Compression fracture of lumbar vertebra (HCC)    Coronary artery disease    Daily headache    "recently" (05/11/2014)   Depression    "I might be slight" (05/11/2014)   GERD (gastroesophageal reflux disease)    High cholesterol    Hypertension    Hypothyroid    OSA treated with BiPAP    Pneumonia    "couple times; last time was 03/2011" (05/11/2014)   Type II diabetes mellitus ([place])     PAST SURGICAL HISTORY: Past Surgical History:  Procedure Laterality Date   APPENDECTOMY     CARDIAC CATHETERIZATION  2001; 2009   CATARACT EXTRACTION W/ INTRAOCULAR LENS IMPLANT Left    CHOLECYSTECTOMY  1980's   Archie Endo 07/13/2010    EYE MUSCLE SURGERY Left    LEFT AND RIGHT HEART CATHETERIZATION WITH CORONARY ANGIOGRAM N/A 05/30/2014   Procedure: LEFT AND RIGHT HEART CATHETERIZATION WITH CORONARY ANGIOGRAM;  Surgeon: Jolaine Artist, MD;  Location: [facility] CATH LAB;  Service: Cardiovascular;  Laterality: N/A;   LEFT HEART CATH AND CORONARY ANGIOGRAPHY N/A 12/20/2018   Procedure: LEFT HEART CATH AND CORONARY ANGIOGRAPHY and possible intervention;  Surgeon: Adrian Prows, MD;  Location: [place] CV LAB;  Service: Cardiovascular;  Laterality: N/A;   MASTOIDECTOMY Right 1970's   Archie Endo 07/13/2010   SHOULDER OPEN ROTATOR CUFF REPAIR Left 07/2010   SPHINCTEROTOMY     TONSILLECTOMY AND ADENOIDECTOMY      FAMILY  HISTORY: The patient's family history includes Heart attack in his father; Kidney failure in his mother.   SOCIAL HISTORY:  The patient  reports that he quit smoking about 29 years ago. His smoking use included cigarettes. He has a 60.00 pack-year smoking history. He has never used smokeless tobacco. He reports current alcohol use. He reports that he does not use drugs.  Review of Systems  Constitutional: Positive for malaise/fatigue. Negative for chills and fever.  HENT: Negative for hoarse voice and nosebleeds.   Eyes: Negative for discharge, double vision and pain.  Cardiovascular: Positive for dyspnea on exertion. Negative for chest pain, claudication, leg swelling, near-syncope, orthopnea, palpitations, paroxysmal nocturnal dyspnea and syncope.  Respiratory: Negative for hemoptysis and shortness of breath.   Musculoskeletal:  Negative for muscle cramps and myalgias.  Gastrointestinal: Positive for diarrhea and melena. Negative for abdominal pain, constipation, hematemesis, hematochezia, nausea and vomiting.  Neurological: Negative for dizziness and light-headedness.    PHYSICAL EXAM: Vitals with BMI 11/23/2019 11/23/2019 10/28/2019  Height - 5\' 10"  5\' 10"   Weight - 266 lbs 264 lbs 3 oz  BMI - 25.05 39.76  Systolic 94 92 734  Diastolic 60 57 70  Pulse 85 89 88   CONSTITUTIONAL: Appropriate Caucasian gentleman, hemodynamically stable, no acute distress. SKIN: Skin is warm and dry. No rash noted. No cyanosis. No pallor. No jaundice HEAD: Normocephalic and atraumatic.  EYES: No scleral icterus MOUTH/THROAT: Moist oral membranes.  NECK: No JVD present. No thyromegaly noted. No carotid bruits  LYMPHATIC: No visible cervical adenopathy.  Dose of CHEST Normal respiratory effort. No intercostal retractions  LUNGS: Clear to auscultation bilaterally. No stridor. No wheezes. No rales.  CARDIOVASCULAR: Regular rate and rhythm, positive S1-S2, no murmurs rubs or gallops appreciated. ABDOMINAL:  Obese, soft, nontender, nondistended, positive bowel sounds all 4 quadrants. No apparent ascites.  EXTREMITIES: No peripheral edema  HEMATOLOGIC: No significant bruising NEUROLOGIC: Oriented to person, place, and time. Nonfocal. Normal muscle tone.  PSYCHIATRIC: Normal mood and affect. Normal behavior. Cooperative  CARDIAC DATABASE: EKG:  10/28/2019: Normal sinus rhythm, 94 bpm, rightward axis, first-degree AV block, poor R wave progression, consider old anterior infarct.   Echocardiogram: 12/19/2018: 45-50%, indeterminate diastolic filling, elevated LAP, moderately dilated left atrium, mildly dilated right atrium, mildly dilated right ventricle.   Stress Testing:  NA  Heart Catheterization: 12/20/2018: Left ventricle is borderline dilated. Mild decrease in LV systolic function with global hypokinesis, EF 45%. Normal LVEDP. Minimal luminal irregularity, no coronary artery disease. Right dominant circulation.   LABORATORY DATA: CBC Latest Ref Rng & Units 12/16/2018 11/05/2016 11/04/2016  WBC 4.0 - 10.5 K/uL 8.2 10.6(H) 10.5  Hemoglobin 13.0 - 17.0 g/dL 14.1 11.8(L) 12.2(L)  Hematocrit 39 - 52 % 40.4 36.0(L) 36.3(L)  Platelets 150 - 400 K/uL 194 224 211    CMP Latest Ref Rng & Units 12/19/2018 12/17/2018 12/16/2018  Glucose 70 - 99 mg/dL 144(H) 219(H) 333(H)  BUN 8 - 23 mg/dL 20 10 10   Creatinine 0.61 - 1.24 mg/dL 1.01 0.99 0.97  Sodium 135 - 145 mmol/L 136 136 135  Potassium 3.5 - 5.1 mmol/L 3.8 3.1(L) 3.6  Chloride 98 - 111 mmol/L 98 97(L) 96(L)  CO2 22 - 32 mmol/L 26 27 27   Calcium 8.9 - 10.3 mg/dL 9.9 9.8 9.9  Total Protein 6.5 - 8.1 g/dL - 8.2(H) -  Total Bilirubin 0.3 - 1.2 mg/dL - 2.0(H) -  Alkaline Phos 38 - 126 U/L - 112 -  AST 15 - 41 U/L - 24 -  ALT 0 - 44 U/L - 27 -    Lipid Panel     Component Value Date/Time   CHOL 142 12/17/2018 0420   TRIG 109 12/17/2018 0420   HDL 59 12/17/2018 0420   CHOLHDL 2.4 12/17/2018 0420   VLDL 22 12/17/2018 0420   LDLCALC 61  12/17/2018 0420    Lab Results  Component Value Date   HGBA1C 6.4 (H) 12/17/2018   HGBA1C 6.2 (H) 05/11/2014   HGBA1C 7.3 (H) 04/05/2011   No components found for: NTPROBNP Lab Results  Component Value Date   TSH 3.819 12/17/2018   TSH 1.309 05/11/2014   TSH 1.580 04/05/2011    Cardiac Panel (last 3 results) No results for input(s): CKTOTAL, CKMB, TROPONINIHS, RELINDX in  the last 72 hours.  IMPRESSION:    ICD-10-CM   1. Black tarry stools  K92.1   2. Dyspnea on exertion  R06.00   3. Lightheadedness  R42   4. Paroxysmal atrial fibrillation (HCC)  I48.0   5. Long term (current) use of anticoagulants  Z79.01   6. Long term current use of antiarrhythmic drug  Z79.899   7. Chronic HFrEF (heart failure with reduced ejection fraction) (HCC)  I50.22   8. Essential hypertension  I10   9. Mixed hyperlipidemia  E78.2   10. OSA treated with BiPAP  G47.33   11. Former smoker  Z87.891   77. Class 2 severe obesity due to excess calories with serious comorbidity and body mass index (BMI) of 38.0 to 38.9 in adult [facility])  E66.01    Z68.38      RECOMMENDATIONS: ZAILYN ROWSER is a 74 y.o. male whose past medical history and cardiovascular risk factors include: Paroxysmal atrial fibrillation, oral anticoagulation, long-term antiarrhythmic medication, heart failure with reduced EF, stage C, NYHA class II, hypertension, hyperlipidemia, OSA on CPAP, diabetes mellitus, obesity due to excess calories, advanced age.  Black tarry stools:  Patient's been having symptoms of melanotic stools for approximately 2 weeks and have been getting progressively worse.  He is also on diuretic therapy for his chronic heart failure and in addition to diarrhea may be intravascularly depleted which is also contributing to his lightheaded and dizziness.  Patient's blood pressures today are very soft with systolics in the 90 mmHg range.  His heart rate is in the high 80s despite being on sotalol.  Patient states  that he just does not feel baseline and needs to be evaluated sooner and wishes to go to ER as opposed to outpatient evaluation, which I agree with. Patient does not want to be transported via EMS to ER.  But he will drive to ER after today's office visit.  I did get the most recent blood work from the patient's primary care office dated October 31, 2019 but it did not include CBC.   I have asked the patient to let the ER physician contact me for transition of care purposes.  Hold anticoagulation for now until he has been evaluated by GI.  Hold diuretic therapy.  Paroxysmal atrial fibrillation:  CHA2DS2-VASc SCORE is 5 which correlates to 6.7 % risk of stroke per year.  Rate/rhythm control: Sotalol.  Thromboembolic prophylaxis: Eliquis (currently being held-see above)  Long-term oral anticoagulation:  Indication: Paroxysmal atrial fibrillation.  Patient does endorse any evidence of bleeding.   Hold Eliquis for now until evaluated by GI.   Patient understands by holding oral anticoagulation he is at higher risk of thromboembolic events.  He will seek medical attention at the closest ER via EMS if he has such symptoms.  Long-term antiarrhythmic medications: We will check BMP and Mg level.   Chronic HFrEF, stage C, NYHA class II/III:  Continue current medical therapy.  Check BMP, BNP, Mg level.   Recommend daily weight check, strict I/O's  Fluid restriction to <2L per day, Na restriction < 1.5g per day  Not starting Entresto secondary to soft blood pressures.  Echocardiogram will be ordered to evaluate for structural heart disease and left ventricular systolic function, re-schedule.  Nuclear stress test recommended to evaluate for reversible ischemia, pending.   Mixed hyperlipidemia:  Patient is tolerating Livalo well without any side effects or intolerances. But now cannot afford it.   Samples provided will have the office reach out  to see if prior Josem Kaufmann is needed.    Patient was previously on pravastatin which was discontinued secondary to myalgias.    FINAL MEDICATION LIST END OF ENCOUNTER: Meds ordered this encounter  Medications   DISCONTD: pantoprazole (PROTONIX) 40 MG tablet    Sig: Take 1 tablet (40 mg total) by mouth 2 (two) times daily.    Dispense:  60 tablet    Refill:  0     Current Outpatient Medications:    BD PEN NEEDLE NANO 2ND GEN 32G X 4 MM MISC, , Disp: , Rfl:    bimatoprost (LUMIGAN) 0.01 % SOLN, Place 1 drop into both eyes at bedtime. , Disp: , Rfl:    Cholecalciferol (VITAMIN D3) 2000 units capsule, Take 2,000 Units by mouth daily., Disp: , Rfl:    folic acid (FOLVITE) 1 MG tablet, Take 1 mg by mouth daily., Disp: , Rfl:    glucosamine-chondroitin 500-400 MG tablet, Take 1 tablet by mouth 3 (three) times daily., Disp: , Rfl:    HUMULIN R U-500 KWIKPEN 500 UNIT/ML kwikpen, Inject 145 Units into the skin 2 (two) times daily., Disp: , Rfl:    JARDIANCE 10 MG TABS tablet, Take 10 mg by mouth daily., Disp: , Rfl:    levothyroxine (SYNTHROID, LEVOTHROID) 100 MCG tablet, Take 100 mcg by mouth daily.  , Disp: , Rfl:    losartan (COZAAR) 50 MG tablet, Take 0.5 tablets (25 mg total) by mouth daily. (Patient taking differently: Take 50 mg by mouth daily. ), Disp: , Rfl:    methotrexate 2.5 MG tablet, Take 5 mg by mouth once a week. 5mg  in Morning, 5mg  at night, Disp: , Rfl:    nitroGLYCERIN (NITROSTAT) 0.4 MG SL tablet, Place 0.4 mg under the tongue every 5 (five) minutes as needed for chest pain. , Disp: , Rfl:    Pitavastatin Calcium (LIVALO) 2 MG TABS, Take 1 tablet (2 mg total) by mouth daily., Disp: 30 tablet, Rfl: 6   potassium chloride SA (KLOR-CON) 20 MEQ tablet, Take 1 tablet (20 mEq total) by mouth as directed. Take only on days taking Lasix, Disp: 30 tablet, Rfl: 2   sotalol (BETAPACE) 80 MG tablet, TAKE 0.5 TABLETS BY MOUTH EVERY MORNING AND TAKE 1 TABLET IN THE EVENING, Disp: 60 tablet, Rfl: 1   TRULICITY 1.00  FH/2.1FX SOPN, INJECT 0.75 MG ONCE A WEEK SUBCUTANEOUS 30 DAYS, Disp: , Rfl:   No orders of the defined types were placed in this encounter.  --Continue cardiac medications as reconciled in final medication list. --Return in about 2 weeks (around 12/07/2019) for post-hospitalization. . Or sooner if needed. --Continue follow-up with your primary care physician regarding the management of your other chronic comorbid conditions.  Patient's questions and concerns were addressed to his satisfaction. He voices understanding of the instructions provided during this encounter.   This note was created using a voice recognition software as a result there may be grammatical errors inadvertently enclosed that do not reflect the nature of this encounter. Every attempt is made to correct such errors.  Rex Kras, [place], [facility]  Pager: [phone] Office: [phone]

## 2019-11-23 NOTE — ED Notes (Signed)
Patient verbalizes understanding of discharge instructions. Opportunity for questioning and answers were provided. Armband removed by staff, pt discharged from ED to home 

## 2019-11-23 NOTE — ED Notes (Signed)
Pt given a urinal.

## 2019-11-23 NOTE — ED Triage Notes (Signed)
Pt states his doctor sent him over for ongoing fatigue x1.5 weeks, pt also reports that he has had diarrhea that has been black. States doctor stopped his blood thinner due to this. Pt a/ox4, resp e/u, nad.

## 2019-11-23 NOTE — ED Provider Notes (Signed)
[facility] DE[facility] Provider Note   CSN: [id number] Arrival date & time: 11/23/19  1350     History Chief Complaint  Patient presents with  . Fatigue  . Rectal Bleeding    Joshua Lara is a 74 y.o. male.  HPI Patient is a 74 year old male with a medical history as noted below.  He presents today from his cardiology office due to fatigue for the past month as well as intermittent black stools for the past 2 weeks. He was being evaluated by his cardiologist today for a stress test and echocardiogram and mentioned his sx. It was recommended that he come to the ED for further evaluation. He denies any other sx at this time.  No fevers, chills, chest pain, shortness of breath, abdominal pain, nausea, vomiting, constipation, urinary changes, syncope.    Past Medical History:  Diagnosis Date  . Arthritis    "hands, back, ankles" (05/11/2014)  . CHF (congestive heart failure) ([place])   . Chronic lower back pain   . Compression fracture of lumbar vertebra ([place])   . Coronary artery disease   . Daily headache    "recently" (05/11/2014)  . Depression    "I might be slight" (05/11/2014)  . GERD (gastroesophageal reflux disease)   . High cholesterol   . Hypertension   . Hypothyroid   . OSA treated with BiPAP   . Pneumonia    "couple times; last time was 03/2011" (05/11/2014)  . Type II diabetes mellitus [facility])     Patient Active Problem List   Diagnosis Date Noted  . Atrial fibrillation with rapid ventricular response ([place]) 12/17/2018  . Paroxysmal atrial fibrillation ([place]) 12/17/2018  . Chronic midline low back pain without sciatica 08/23/2018  . Angioedema 11/04/2016  . Severe tongue swelling   . Acute on chronic diastolic heart failure ([place]) 05/15/2014  . Dyspnea 05/10/2014  . Acute respiratory failure ([place]) 05/10/2014  . OSA on CPAP 05/10/2014  . Abnormal CT scan, chest 05/10/2014  . DOE (dyspnea on exertion) 04/05/2011  . PNA (pneumonia)  04/05/2011  . DM (diabetes mellitus) ([place]) 04/05/2011  . HTN (hypertension) 04/05/2011  . Tachycardia 04/05/2011  . Chest pain 04/05/2011  . Fever 04/05/2011  . Leukocytosis 04/05/2011  . Hypothyroidism 04/05/2011  . GERD (gastroesophageal reflux disease) 04/05/2011  . RA (rheumatoid arthritis) ([place]) 04/05/2011  . Obesity 04/05/2011  . CAD in native artery 04/05/2011  . Hypokalemia 04/05/2011    Past Surgical History:  Procedure Laterality Date  . APPENDECTOMY    . CARDIAC CATHETERIZATION  2001; 2009  . CATARACT EXTRACTION W/ INTRAOCULAR LENS IMPLANT Left   . CHOLECYSTECTOMY  1980's   Archie Endo 07/13/2010   . EYE MUSCLE SURGERY Left   . LEFT AND RIGHT HEART CATHETERIZATION WITH CORONARY ANGIOGRAM N/A 05/30/2014   Procedure: LEFT AND RIGHT HEART CATHETERIZATION WITH CORONARY ANGIOGRAM;  Surgeon: Jolaine Artist, MD;  Location[facility] C[facility];  Service: Cardiovascular;  Laterality: N/A;  . LEFT HEART CATH AND CORONARY ANGIOGRAPHY N/A 12/20/2018   Procedure: LEFT HEART CATH AND CORONARY ANGIOGRAPHY and possible intervention;  Surgeon: Adrian Prows, MD;  Location: [facility] CV LAB;  Service: Cardiovascular;  Laterality: N/A;  . MASTOIDECTOMY Right 1970's   Archie Endo 07/13/2010  . SHOULDER OPEN ROTATOR CUFF REPAIR Left 07/2010  . SPHINCTEROTOMY    . TONSILLECTOMY AND ADENOIDECTOMY         Family History  Problem Relation Age of Onset  . Kidney failure Mother   . Heart attack  Father     Social History   Tobacco Use  . Smoking status: Former Smoker    Packs/day: 2.00    Years: 30.00    Pack years: 60.00    Types: Cigarettes    Quit date: 05/12/1990    Years since quitting: 29.5  . Smokeless tobacco: Never Used  Substance Use Topics  . Alcohol use: Yes    Alcohol/week: 0.0 standard drinks    Comment: 05/11/2014 "an occasional beer when out w/people; not often"  . Drug use: No    Home Medications Prior to Admission medications   Medication Sig Start Date End Date Taking?  Authorizing Provider  BD PEN NEEDLE NANO 2ND GEN 32G X 4 MM MISC  11/22/19   [provider]  bimatoprost (LUMIGAN) 0.01 % SOLN Place 1 drop into both eyes at bedtime.  10/17/15   [provider]  Cholecalciferol (VITAMIN D3) 2000 units capsule Take 2,000 Units by mouth daily.    [provider]  folic acid (FOLVITE) 1 MG tablet Take 1 mg by mouth daily. 09/05/19   [provider]  glucosamine-chondroitin 500-400 MG tablet Take 1 tablet by mouth 3 (three) times daily.    [provider]  HUMULIN R U-500 KWIKPEN 500 UNIT/ML kwikpen Inject 145 Units into the skin 2 (two) times daily. 04/12/19   [provider]  JARDIANCE 10 MG TABS tablet Take 10 mg by mouth daily. 10/03/19   [provider]  levothyroxine (SYNTHROID, LEVOTHROID) 100 MCG tablet Take 100 mcg by mouth daily.      [provider]  losartan (COZAAR) 50 MG tablet Take 0.5 tablets (25 mg total) by mouth daily. Patient taking differently: Take 50 mg by mouth daily.  12/18/18   Adrian Prows, MD  methotrexate 2.5 MG tablet Take 5 mg by mouth once a week. 5mg  in Morning, 5mg  at night    [provider]  nitroGLYCERIN (NITROSTAT) 0.4 MG SL tablet Place 0.4 mg under the tongue every 5 (five) minutes as needed for chest pain.     [provider]  Pitavastatin Calcium (LIVALO) 2 MG TABS Take 1 tablet (2 mg total) by mouth daily. 11/18/19   Tolia, Sunit, DO  potassium chloride SA (KLOR-CON) 20 MEQ tablet Take 1 tablet (20 mEq total) by mouth as directed. Take only on days taking Lasix 04/20/19   Miquel Dunn, NP  sotalol (BETAPACE) 80 MG tablet TAKE 0.5 TABLETS BY MOUTH EVERY MORNING AND TAKE 1 TABLET IN THE EVENING 10/24/19   Tolia, Sunit, DO  TRULICITY 0.73 XT/0.6YI SOPN INJECT 0.75 MG ONCE A WEEK SUBCUTANEOUS 30 DAYS 09/11/19   [provider]    Allergies    Bactrim [sulfamethoxazole-trimethoprim], Zantac [ranitidine hcl], Etanercept, Etanercept, and  Tape  Review of Systems   Review of Systems  All other systems reviewed and are negative. Ten systems reviewed and are negative for acute change, except as noted in the HPI.   Physical Exam Updated Vital Signs BP 114/77   Pulse 80   Temp 98.3 F (36.8 C) (Oral)   Resp (!) 23   SpO2 96%   Physical Exam Vitals and nursing note reviewed.  Constitutional:      General: He is not in acute distress.    Appearance: Normal appearance. He is obese. He is not ill-appearing, toxic-appearing or diaphoretic.  HENT:     Head: Normocephalic and atraumatic.     Right Ear: External ear normal.     Left Ear:  External ear normal.     Nose: Nose normal.     Mouth/Throat:     Mouth: Mucous membranes are moist.     Pharynx: Oropharynx is clear. No oropharyngeal exudate or posterior oropharyngeal erythema.  Eyes:     General: No scleral icterus.       Right eye: No discharge.        Left eye: No discharge.     Extraocular Movements: Extraocular movements intact.     Conjunctiva/sclera: Conjunctivae normal.  Cardiovascular:     Rate and Rhythm: Normal rate and regular rhythm.     Pulses: Normal pulses.     Heart sounds: Normal heart sounds. No murmur heard.  No friction rub. No gallop.   Pulmonary:     Effort: Pulmonary effort is normal. No respiratory distress.     Breath sounds: Normal breath sounds. No stridor. No wheezing, rhonchi or rales.  Abdominal:     General: Abdomen is flat.     Palpations: Abdomen is soft.     Tenderness: There is no abdominal tenderness.  Genitourinary:    Rectum: Guaiac result negative.     Comments: Nursing chaperone present.  Normal-appearing anus with one nonthrombosed external hemorrhoid.  Small amount of brown stool appreciated in the rectal vault.  Patient nontender throughout exam.  No bright red blood.  No melena. Musculoskeletal:        General: Normal range of motion.     Cervical back: Normal range of motion and neck supple. No tenderness.   Skin:    General: Skin is warm and dry.  Neurological:     General: No focal deficit present.     Mental Status: He is alert and oriented to person, place, and time.  Psychiatric:        Mood and Affect: Mood normal.        Behavior: Behavior normal.     ED Results / Procedures / Treatments   Labs (all labs ordered are listed, but only abnormal results are displayed) Labs Reviewed  COMPREHENSIVE METABOLIC PANEL - Abnormal; Notable for the following components:      Result Value   Glucose, Bld 222 (*)    ALT 46 (*)    All other components within normal limits  CBC  POC OCCULT BLOOD, ED  TYPE AND SCREEN  ABO/RH    EKG None  Radiology No results found.  Procedures Procedures (including critical care time)  Medications Ordered in ED Medications - No data to display  ED Course  I have reviewed the triage vital signs and the nursing notes.  Pertinent labs & imaging results that were available during my care of the patient were reviewed by me and considered in my medical decision making (see chart for details).  Clinical Course as of Nov 22 2017  Wed Nov 23, 2019  1938 I spoke to his cardiologist and he agrees that patient is safe for discharge at this time.  They are planning on reaching out to them tomorrow to schedule a new appointment.  Patient can be restarted on his Eliquis at this time.  I will discuss this with the patient.   [LJ]  2017 Fecal Occult Blood, POC: NEGATIVE [LJ]  2017 Hemoglobin: 14.9 [LJ]    Clinical Course User Index [LJ] Rayna Sexton, PA-C   MDM Rules/Calculators/A&P                          Pt is a  74 y.o. male that present with a history, physical exam, ED Clinical Course as noted above.   Patient presents today with fatigue and concern for melanotic stools.  He was sent over at the request of his cardiologist.  Physical exam is reassuring.  POC Hemoccult test was negative.  Hemoglobin is stable at this time at 14.9.   I discussed  this with his cardiologist and they agree that the patient is safe for discharge at this time.  Patient can resume his Eliquis.  I discussed that with him.  Cardiology is planning on following up with the patient tomorrow for reevaluation.  His questions were answered and he was amicable at the time of discharge.  His vital signs are stable.  Patient discharged to home/self care.  Condition at discharge: Stable  Note: Portions of this report may have been transcribed using voice recognition software. Every effort was made to ensure accuracy; however, inadvertent computerized transcription errors may be present.   Final Clinical Impression(s) / ED Diagnoses Final diagnoses:  Fatigue, unspecified type    Rx / DC Orders ED Discharge Orders    None       Rayna Sexton, PA-C 11/23/19 2021    Deno Etienne, DO 11/23/19 2024    Deno Etienne, DO 11/23/19 2025

## 2019-11-29 ENCOUNTER — Telehealth: Payer: Self-pay

## 2019-11-29 NOTE — Telephone Encounter (Signed)
Joshua Lara from [facility] called to inform us that pt mention that pt has not been taking Livalo 2 mg since January, and they would like to know is there anything else he can take. Please advise thank you

## 2019-11-29 NOTE — Telephone Encounter (Signed)
-----   Message from [place], [place] sent at 11/26/2019 11:56 PM EDT ----- The nuclear stress test that was recently performed was reported as low risk study.   The other details of the report will be discussed at the next office visit.    If you continue to have symptoms that have increased in intensity, frequency, duration or new symptoms suggestive of typical chest pain as discussed during last office visit please still seek medical attention at the closest ER via EMS.

## 2019-11-29 NOTE — Telephone Encounter (Signed)
Pt called asking will he be continue taking BIDIL or will you change it to another medication. Please advise.

## 2019-11-29 NOTE — Telephone Encounter (Signed)
Left a voicemail to cb in regards to stress test.

## 2019-12-01 NOTE — Telephone Encounter (Signed)
Still working on it

## 2019-12-01 NOTE — Telephone Encounter (Signed)
Joshua Lara, is this taken care off. We spoke about how he has been getting samples from the office and working on PA right? Anything else for me to do at this time let me know.

## 2019-12-02 ENCOUNTER — Ambulatory Visit: Payer: BLUE CROSS/BLUE SHIELD | Admitting: Cardiology

## 2019-12-02 ENCOUNTER — Other Ambulatory Visit: Payer: PPO

## 2019-12-02 ENCOUNTER — Other Ambulatory Visit: Payer: Self-pay | Admitting: Cardiology

## 2019-12-02 DIAGNOSIS — M069 Rheumatoid arthritis, unspecified: Secondary | ICD-10-CM | POA: Diagnosis not present

## 2019-12-02 DIAGNOSIS — R05 Cough: Secondary | ICD-10-CM | POA: Diagnosis not present

## 2019-12-02 DIAGNOSIS — Z6841 Body Mass Index (BMI) 40.0 and over, adult: Secondary | ICD-10-CM | POA: Diagnosis not present

## 2019-12-02 DIAGNOSIS — Z79899 Other long term (current) drug therapy: Secondary | ICD-10-CM | POA: Diagnosis not present

## 2019-12-02 DIAGNOSIS — I10 Essential (primary) hypertension: Secondary | ICD-10-CM | POA: Diagnosis not present

## 2019-12-02 DIAGNOSIS — I2510 Atherosclerotic heart disease of native coronary artery without angina pectoris: Secondary | ICD-10-CM | POA: Diagnosis not present

## 2019-12-02 DIAGNOSIS — G4733 Obstructive sleep apnea (adult) (pediatric): Secondary | ICD-10-CM | POA: Diagnosis not present

## 2019-12-02 DIAGNOSIS — I480 Paroxysmal atrial fibrillation: Secondary | ICD-10-CM

## 2019-12-02 DIAGNOSIS — R5383 Other fatigue: Secondary | ICD-10-CM | POA: Diagnosis not present

## 2019-12-02 DIAGNOSIS — E118 Type 2 diabetes mellitus with unspecified complications: Secondary | ICD-10-CM | POA: Diagnosis not present

## 2019-12-02 DIAGNOSIS — I5030 Unspecified diastolic (congestive) heart failure: Secondary | ICD-10-CM | POA: Diagnosis not present

## 2019-12-02 DIAGNOSIS — E662 Morbid (severe) obesity with alveolar hypoventilation: Secondary | ICD-10-CM | POA: Diagnosis not present

## 2019-12-02 DIAGNOSIS — E039 Hypothyroidism, unspecified: Secondary | ICD-10-CM | POA: Diagnosis not present

## 2019-12-02 NOTE — Patient Outreach (Signed)
[facility]) Care Management  12/02/2019  TERREZ ANDER [birth year] [id number]   Received referral for the patient by fax from Cyndi Bender, PA-patient's PCP. Patient is not attributed to [facility]. Sent referral information to [facility]. Referral information and OV notes scanned into patient's chart.  Information sent back to PCP to be aware who will assist patient with this request.

## 2019-12-02 NOTE — Telephone Encounter (Signed)
Can this be refilled? 

## 2019-12-04 NOTE — Progress Notes (Signed)
External Labs: Collected: 10/31/2019 Creatinine 0.84 mg/dL. eGFR: 87 mL/min per 1.73 m Lipid profile: Total cholesterol 138, triglycerides 265, HDL 37, LDL 59 ProBNP: 9.5

## 2019-12-09 ENCOUNTER — Other Ambulatory Visit: Payer: Self-pay

## 2019-12-09 ENCOUNTER — Ambulatory Visit: Payer: PPO

## 2019-12-09 DIAGNOSIS — I5022 Chronic systolic (congestive) heart failure: Secondary | ICD-10-CM

## 2019-12-13 ENCOUNTER — Telehealth: Payer: Self-pay

## 2019-12-13 NOTE — Telephone Encounter (Signed)
-----   Message from [place], [place] sent at 12/11/2019 10:58 PM EDT ----- We will review the results at the upcoming office visit.  Please remind the patient to bring the medication bottles at the next office visit.

## 2019-12-13 NOTE — Telephone Encounter (Signed)
Informed patient of upcoming appt and to bring all medication bottles with him.

## 2019-12-14 NOTE — Telephone Encounter (Signed)
Did this get taken core of?

## 2019-12-15 ENCOUNTER — Ambulatory Visit: Payer: PPO | Admitting: Cardiology

## 2019-12-18 ENCOUNTER — Other Ambulatory Visit: Payer: Self-pay | Admitting: Cardiology

## 2019-12-21 ENCOUNTER — Encounter: Payer: Self-pay | Admitting: Cardiology

## 2019-12-21 ENCOUNTER — Other Ambulatory Visit: Payer: Self-pay

## 2019-12-21 ENCOUNTER — Ambulatory Visit: Payer: PPO | Admitting: Cardiology

## 2019-12-21 VITALS — BP 126/70 | HR 83 | Resp 16 | Ht 70.0 in | Wt 264.0 lb

## 2019-12-21 DIAGNOSIS — E66812 Obesity, class 2: Secondary | ICD-10-CM

## 2019-12-21 DIAGNOSIS — R0600 Dyspnea, unspecified: Secondary | ICD-10-CM

## 2019-12-21 DIAGNOSIS — Z7901 Long term (current) use of anticoagulants: Secondary | ICD-10-CM

## 2019-12-21 DIAGNOSIS — R0609 Other forms of dyspnea: Secondary | ICD-10-CM

## 2019-12-21 DIAGNOSIS — Z87891 Personal history of nicotine dependence: Secondary | ICD-10-CM

## 2019-12-21 DIAGNOSIS — I480 Paroxysmal atrial fibrillation: Secondary | ICD-10-CM | POA: Diagnosis not present

## 2019-12-21 DIAGNOSIS — G4733 Obstructive sleep apnea (adult) (pediatric): Secondary | ICD-10-CM | POA: Diagnosis not present

## 2019-12-21 DIAGNOSIS — E782 Mixed hyperlipidemia: Secondary | ICD-10-CM | POA: Diagnosis not present

## 2019-12-21 DIAGNOSIS — Z6837 Body mass index (BMI) 37.0-37.9, adult: Secondary | ICD-10-CM

## 2019-12-21 DIAGNOSIS — I10 Essential (primary) hypertension: Secondary | ICD-10-CM | POA: Diagnosis not present

## 2019-12-21 DIAGNOSIS — I5022 Chronic systolic (congestive) heart failure: Secondary | ICD-10-CM | POA: Diagnosis not present

## 2019-12-21 DIAGNOSIS — Z79899 Other long term (current) drug therapy: Secondary | ICD-10-CM | POA: Diagnosis not present

## 2019-12-21 MED ORDER — ENTRESTO 49-51 MG PO TABS: 1.0000 | ORAL_TABLET | Freq: Two times a day (BID) | ORAL | 0 refills | Status: DC

## 2019-12-21 NOTE — Progress Notes (Signed)
Joshua Lara Date of Birth: 1945/09/13 MRN: [id number] Primary Care Provider:[name], [name] Former Cardiology Providers: Jeri Lager, APRN, FNP-C Primary Cardiologist: Rex Kras, DO, [facility] (established care 10/28/2019)  Date: 12/21/19 Last Office Visit: 11/23/2019  Chief Complaint  Patient presents with  . Shortness of Breath  . Results    HPI  Joshua Lara is a 74 y.o.  male who presents to the office with a chief complaint of "shortness of breath and review test results." Patient's past medical history and cardiovascular risk factors include: Paroxysmal atrial fibrillation, oral anticoagulation, long-term antiarrhythmic medication, heart failure with reduced EF, stage C, NYHA class II, hypertension, hyperlipidemia, OSA on CPAP, diabetes mellitus, obesity due to excess calories, advanced age..  Patient was originally under the care of Jeri Lager, APRN, FNP-C and I started seeing him in June 2021 for Afib management.   During last office visit patient been having symptoms of effort related dyspnea and given his extensive cardiac history he was recommended to undergo nuclear stress test and echocardiogram.  Patient's LVEF per echocardiogram is mildly reduced at 45-50% with indeterminate diastolic filling pattern elevated left atrial pressure.  No significant valvular heart disease or evidence of pulmonary hypertension with RVSP.  His a nuclear stress test was reported to be overall low risk study no reversible ischemia noted but he did have a soft tissue attenuation and apical thinning artifact.  Since last visit patient states that his symptoms of shortness of breath remained stable without any progression.  No chest pain at rest or with effort related activities.  Patient is also been approved for Livalo and is now on cholesterol medications.  In addition, patient states that he is unable to afford Livalo.   ALLERGIES: Allergies  Allergen Reactions  . Bactrim  [Sulfamethoxazole-Trimethoprim] Swelling  . Zantac [Ranitidine Hcl] Anaphylaxis    Has anaphylactic shock--intubated and in ICU x 4 days   . Etanercept Other (See Comments) and Cough    (ENBREL); Patient thinks it contributed to heart issues  . Etanercept     Other reaction(s): Cough (ALLERGY/intolerance), Other (See Comments) (ENBREL); Patient thinks it contributed to heart issues  . Tape Itching    Can only tolerate bandages for less than 24-hr periods Can only tolerate bandages for less than 24-hr periods     MEDICATION LIST PRIOR TO VISIT: Current Outpatient Medications on File Prior to Visit  Medication Sig Dispense Refill  . BD PEN NEEDLE NANO 2ND GEN 32G X 4 MM MISC     . bimatoprost (LUMIGAN) 0.01 % SOLN Place 1 drop into both eyes at bedtime.     Marland Kitchen ELIQUIS 5 MG TABS tablet Take 1 tablet by mouth 2 (two) times daily.    . folic acid (FOLVITE) 1 MG tablet Take 1 mg by mouth daily.    Marland Kitchen HUMULIN R U-500 KWIKPEN 500 UNIT/ML kwikpen Inject 145 Units into the skin 2 (two) times daily.    Marland Kitchen JARDIANCE 10 MG TABS tablet Take 10 mg by mouth daily.    Marland Kitchen levothyroxine (SYNTHROID, LEVOTHROID) 100 MCG tablet Take 100 mcg by mouth daily.      . methotrexate 2.5 MG tablet Take 5 mg by mouth once a week. '5mg'$  in Morning, '5mg'$  at night    . nitroGLYCERIN (NITROSTAT) 0.4 MG SL tablet Place 0.4 mg under the tongue every 5 (five) minutes as needed for chest pain.     . pantoprazole (PROTONIX) 40 MG tablet Take 40 mg by mouth 2 (two) times  daily.    . Pitavastatin Calcium (LIVALO) 2 MG TABS Take 1 tablet (2 mg total) by mouth daily. 30 tablet 6  . sotalol (BETAPACE) 80 MG tablet TAKE 0.5 TABLETS BY MOUTH EVERY MORNING AND TAKE 1 TABLET IN THE EVENING 60 tablet 1  . TRULICITY 1.44 RX/5.4MG SOPN INJECT 0.75 MG ONCE A WEEK SUBCUTANEOUS 30 DAYS     No current facility-administered medications on file prior to visit.    PAST MEDICAL HISTORY: Past Medical History:  Diagnosis Date  . Arthritis     "hands, back, ankles" (05/11/2014)  . CHF (congestive heart failure) ([place])   . Chronic lower back pain   . Compression fracture of lumbar vertebra ([place])   . Coronary artery disease   . Daily headache    "recently" (05/11/2014)  . Depression    "I might be slight" (05/11/2014)  . GERD (gastroesophageal reflux disease)   . High cholesterol   . Hypertension   . Hypothyroid   . OSA treated with BiPAP   . Pneumonia    "couple times; last time was 03/2011" (05/11/2014)  . Type II diabetes mellitus ([place])     PAST SURGICAL HISTORY: Past Surgical History:  Procedure Laterality Date  . APPENDECTOMY    . CARDIAC CATHETERIZATION  2001; 2009  . CATARACT EXTRACTION W/ INTRAOCULAR LENS IMPLANT Left   . CHOLECYSTECTOMY  1980's   Archie Endo 07/13/2010   . EYE MUSCLE SURGERY Left   . LEFT AND RIGHT HEART CATHETERIZATION WITH CORONARY ANGIOGRAM N/A 05/30/2014   Procedure: LEFT AND RIGHT HEART CATHETERIZATION WITH CORONARY ANGIOGRAM;  Surgeon: Jolaine Artist, MD;  Location: [facility] CATH LAB;  Service: Cardiovascular;  Laterality: N/A;  . LEFT HEART CATH AND CORONARY ANGIOGRAPHY N/A 12/20/2018   Procedure: LEFT HEART CATH AND CORONARY ANGIOGRAPHY and possible intervention;  Surgeon: Adrian Prows, MD;  Location: [facility] LAB;  Service: Cardiovascular;  Laterality: N/A;  . MASTOIDECTOMY Right 1970's   Archie Endo 07/13/2010  . SHOULDER OPEN ROTATOR CUFF REPAIR Left 07/2010  . SPHINCTEROTOMY    . TONSILLECTOMY AND ADENOIDECTOMY      FAMILY HISTORY: The patient's family history includes Heart attack in his father; Kidney failure in his mother.   SOCIAL HISTORY:  The patient  reports that he quit smoking about 29 years ago. His smoking use included cigarettes. He has a 60.00 pack-year smoking history. He has never used smokeless tobacco. He reports current alcohol use. He reports that he does not use drugs.  Review of Systems  Constitutional: Negative for chills and fever.  HENT: Negative for hoarse voice  and nosebleeds.   Eyes: Negative for discharge, double vision and pain.  Cardiovascular: Positive for dyspnea on exertion (improving). Negative for chest pain, claudication, leg swelling, near-syncope, orthopnea, palpitations, paroxysmal nocturnal dyspnea and syncope.  Respiratory: Negative for hemoptysis and shortness of breath.   Musculoskeletal: Negative for muscle cramps and myalgias.  Gastrointestinal: Positive for diarrhea. Negative for abdominal pain, constipation, hematemesis, hematochezia, melena, nausea and vomiting.  Neurological: Negative for dizziness and light-headedness.    PHYSICAL EXAM: Vitals with BMI 12/21/2019 11/23/2019 11/23/2019  Height '5\' 10"'$  - -  Weight 264 lbs - -  BMI 86.76 - -  Systolic 195 - -  Diastolic 70 - -  Pulse 83 81 83   CONSTITUTIONAL: Appropriate Caucasian gentleman, hemodynamically stable, no acute distress. SKIN: Skin is warm and dry. No rash noted. No cyanosis. No pallor. No jaundice HEAD: Normocephalic and atraumatic.  EYES: No scleral icterus MOUTH/THROAT: Moist oral  membranes.  NECK: No JVD present. No thyromegaly noted. No carotid bruits  LYMPHATIC: No visible cervical adenopathy.  Dose of CHEST Normal respiratory effort. No intercostal retractions  LUNGS: Clear to auscultation bilaterally. No stridor. No wheezes. No rales.  CARDIOVASCULAR: Regular rate and rhythm, positive S1-S2, no murmurs rubs or gallops appreciated. ABDOMINAL: Obese, soft, nontender, nondistended, positive bowel sounds all 4 quadrants. No apparent ascites.  EXTREMITIES: No peripheral edema  HEMATOLOGIC: No significant bruising NEUROLOGIC: Oriented to person, place, and time. Nonfocal. Normal muscle tone.  PSYCHIATRIC: Normal mood and affect. Normal behavior. Cooperative  CARDIAC DATABASE: EKG:  10/28/2019: Normal sinus rhythm, 94 bpm, rightward axis, first-degree AV block, poor R wave progression, consider old anterior infarct.   Echocardiogram: 12/09/2019: Mildly  depressed LV systolic function with visual EF 45-50%. Left ventricle cavity is normal in size. Mild left ventricular hypertrophy. Normal global wall motion. Indeterminate diastolic filling pattern, elevated LAP. Calculated EF 42%. Mild tricuspid regurgitation. No evidence of pulmonary hypertension. No other significant valvular abnormalities. Compared to previous study 12/19/2018 no significant change.   Stress Testing:  Lexiscan/modified Bruce Tetrofosmin stress test 11/23/2019: Lexiscan/modified Bruce nuclear stress test performed using 1-day protocol. Stress EKG is non-diagnostic, as this is pharmacological stress test. In addition, stress EKG at 71% MPHR showed sinus rhythm, possible old anteroseptal infarct, poor R wave progression, low voltage, no ischemic changes.   Large areas of soft tissue attenuation seen, particularly in vertical long axis images. In addition, Apical thinning seen on both rest and stress images may be physiological. Overall, no reversible ischemia noted. Stress LVEF 49%, although visually appears normal.  Low risk study.   Heart Catheterization: 12/20/2018: Left ventricle is borderline dilated. Mild decrease in LV systolic function with global hypokinesis, EF 45%. Normal LVEDP. Minimal luminal irregularity, no coronary artery disease. Right dominant circulation.   LABORATORY DATA: CBC Latest Ref Rng & Units 11/23/2019 12/16/2018 11/05/2016  WBC 4.0 - 10.5 K/uL 5.5 8.2 10.6(H)  Hemoglobin 13.0 - 17.0 g/dL 14.9 14.1 11.8(L)  Hematocrit 39 - 52 % 44.0 40.4 36.0(L)  Platelets 150 - 400 K/uL 167 194 224    CMP Latest Ref Rng & Units 11/23/2019 12/19/2018 12/17/2018  Glucose 70 - 99 mg/dL 222(H) 144(H) 219(H)  BUN 8 - 23 mg/dL '16 20 10  '$ Creatinine 0.61 - 1.24 mg/dL 0.94 1.01 0.99  Sodium 135 - 145 mmol/L 136 136 136  Potassium 3.5 - 5.1 mmol/L 4.1 3.8 3.1(L)  Chloride 98 - 111 mmol/L 102 98 97(L)  CO2 22 - 32 mmol/L '23 26 27  '$ Calcium 8.9 - 10.3 mg/dL 9.7 9.9 9.8  Total  Protein 6.5 - 8.1 g/dL 7.2 - 8.2(H)  Total Bilirubin 0.3 - 1.2 mg/dL 0.9 - 2.0(H)  Alkaline Phos 38 - 126 U/L 99 - 112  AST 15 - 41 U/L 41 - 24  ALT 0 - 44 U/L 46(H) - 27    Lipid Panel     Component Value Date/Time   CHOL 142 12/17/2018 0420   TRIG 109 12/17/2018 0420   HDL 59 12/17/2018 0420   CHOLHDL 2.4 12/17/2018 0420   VLDL 22 12/17/2018 0420   LDLCALC 61 12/17/2018 0420    Lab Results  Component Value Date   HGBA1C 6.4 (H) 12/17/2018   HGBA1C 6.2 (H) 05/11/2014   HGBA1C 7.3 (H) 04/05/2011   No components found for: NTPROBNP Lab Results  Component Value Date   TSH 3.819 12/17/2018   TSH 1.309 05/11/2014   TSH 1.580 04/05/2011   External  Labs: Collected: 10/31/2019 Creatinine 0.84 mg/dL. eGFR: 87 mL/min per 1.73 m Lipid profile: Total cholesterol 138, triglycerides 265, HDL 37, LDL 59 ProBNP: 9.5  Cardiac Panel (last 3 results) No results for input(s): CKTOTAL, CKMB, TROPONINIHS, RELINDX in the last 72 hours.  IMPRESSION:    ICD-10-CM   1. Dyspnea on exertion  N82.95 Basic metabolic panel    Magnesium    Pro b natriuretic peptide (BNP)    sacubitril-valsartan (ENTRESTO) 49-51 MG  2. Chronic HFrEF (heart failure with reduced ejection fraction) (HCC)  A21.30 Basic metabolic panel    Magnesium    Pro b natriuretic peptide (BNP)    sacubitril-valsartan (ENTRESTO) 49-51 MG  3. Paroxysmal atrial fibrillation (HCC)  I48.0   4. Long term (current) use of anticoagulants  Z79.01   5. Long term current use of antiarrhythmic drug  Z79.899   6. Essential hypertension  I10   7. Mixed hyperlipidemia  E78.2   8. OSA treated with BiPAP  G47.33   9. Former smoker  Z87.891   42. Class 2 severe obesity due to excess calories with serious comorbidity and body mass index (BMI) of 37.0 to 37.9 in adult [facility])  E66.01    Z68.37      RECOMMENDATIONS: Joshua Lara is a 74 y.o. male whose past medical history and cardiovascular risk factors include: Paroxysmal atrial  fibrillation, oral anticoagulation, long-term antiarrhythmic medication, heart failure with reduced EF, stage C, NYHA class II, hypertension, hyperlipidemia, OSA on CPAP, diabetes mellitus, obesity due to excess calories, advanced age.  Dyspnea on exertion: Multifactorial  Reviewed the results of the echocardiogram and nuclear stress test with the patient at today's office visit.  Patient had a recent left heart catheterization approximately 1 year ago which did not show any significant obstructive coronary disease and nuclear stress test shows no reversible ischemia and LVEF function is a stable compared to prior studies.  Patient symptoms are also overall stable.  We did discuss uptitrating guideline directed medical therapy versus repeating angiography to evaluate for obstructive CAD.  The shared decision was to uptitrate guideline directed medical therapy and if symptoms continue to worsen he will consider angiography at that time.  Discontinue losartan, Lasix, K-Dur.  Start Entresto 49/51 mg p.o. twice daily with blood work 1 week later to evaluate kidney function electrolytes.  We will monitor the patient closely and uptitrate guideline directed medical therapy  Paroxysmal atrial fibrillation:  CHA2DS2-VASc SCORE is 5 which correlates to 6.7 % risk of stroke per year.  Rate/rhythm control: Sotalol.  Thromboembolic prophylaxis: Eliquis   Long-term oral anticoagulation:  Indication: Paroxysmal atrial fibrillation.  Does not endorse any evidence of bleeding.  Long-term antiarrhythmic medications: Currently on sotalol for underlying PAF.  Chronic HFrEF, stage C, NYHA class II/III:  Continue current medical therapy.  We will transition from losartan to [facility].  Check BMP, BNP, Mg level.   Recommend daily weight check, strict I/O's  Fluid restriction to <2L per day, Na restriction < 1.5g per day  Mixed hyperlipidemia:  Patient has been approved for Livalo since last  office visit.  Patient was previously on pravastatin which was discontinued secondary to myalgias.    FINAL MEDICATION LIST END OF ENCOUNTER: Meds ordered this encounter  Medications  . sacubitril-valsartan (ENTRESTO) 49-51 MG    Sig: Take 1 tablet by mouth 2 (two) times daily.    Dispense:  60 tablet    Refill:  0     Current Outpatient Medications:  .  BD PEN  NEEDLE NANO 2ND GEN 32G X 4 MM MISC, , Disp: , Rfl:  .  bimatoprost (LUMIGAN) 0.01 % SOLN, Place 1 drop into both eyes at bedtime. , Disp: , Rfl:  .  ELIQUIS 5 MG TABS tablet, Take 1 tablet by mouth 2 (two) times daily., Disp: , Rfl:  .  folic acid (FOLVITE) 1 MG tablet, Take 1 mg by mouth daily., Disp: , Rfl:  .  HUMULIN R U-500 KWIKPEN 500 UNIT/ML kwikpen, Inject 145 Units into the skin 2 (two) times daily., Disp: , Rfl:  .  JARDIANCE 10 MG TABS tablet, Take 10 mg by mouth daily., Disp: , Rfl:  .  levothyroxine (SYNTHROID, LEVOTHROID) 100 MCG tablet, Take 100 mcg by mouth daily.  , Disp: , Rfl:  .  methotrexate 2.5 MG tablet, Take 5 mg by mouth once a week. '5mg'$  in Morning, '5mg'$  at night, Disp: , Rfl:  .  nitroGLYCERIN (NITROSTAT) 0.4 MG SL tablet, Place 0.4 mg under the tongue every 5 (five) minutes as needed for chest pain. , Disp: , Rfl:  .  pantoprazole (PROTONIX) 40 MG tablet, Take 40 mg by mouth 2 (two) times daily., Disp: , Rfl:  .  Pitavastatin Calcium (LIVALO) 2 MG TABS, Take 1 tablet (2 mg total) by mouth daily., Disp: 30 tablet, Rfl: 6 .  sotalol (BETAPACE) 80 MG tablet, TAKE 0.5 TABLETS BY MOUTH EVERY MORNING AND TAKE 1 TABLET IN THE EVENING, Disp: 60 tablet, Rfl: 1 .  TRULICITY 9.48 XA/7.5SV SOPN, INJECT 0.75 MG ONCE A WEEK SUBCUTANEOUS 30 DAYS, Disp: , Rfl:  .  sacubitril-valsartan (ENTRESTO) 49-51 MG, Take 1 tablet by mouth 2 (two) times daily., Disp: 60 tablet, Rfl: 0  Orders Placed This Encounter  Procedures  . Basic metabolic panel  . Magnesium  . Pro b natriuretic peptide (BNP)   --Continue cardiac  medications as reconciled in final medication list. --Return in about 4 weeks (around 01/18/2020) for heart failure management.. Or sooner if needed. --Continue follow-up with your primary care physician regarding the management of your other chronic comorbid conditions.  Patient's questions and concerns were addressed to his satisfaction. He voices understanding of the instructions provided during this encounter.   This note was created using a voice recognition software as a result there may be grammatical errors inadvertently enclosed that do not reflect the nature of this encounter. Every attempt is made to correct such errors.  Rex Kras, [place], [facility]  Pager: [phone] Office: [phone]

## 2019-12-22 ENCOUNTER — Other Ambulatory Visit: Payer: Self-pay | Admitting: Cardiology

## 2019-12-22 ENCOUNTER — Other Ambulatory Visit: Payer: Self-pay

## 2019-12-22 DIAGNOSIS — I5022 Chronic systolic (congestive) heart failure: Secondary | ICD-10-CM

## 2019-12-22 DIAGNOSIS — R0600 Dyspnea, unspecified: Secondary | ICD-10-CM

## 2019-12-22 DIAGNOSIS — R0609 Other forms of dyspnea: Secondary | ICD-10-CM

## 2019-12-22 MED ORDER — ENTRESTO 49-51 MG PO TABS: 1.0000 | ORAL_TABLET | Freq: Two times a day (BID) | ORAL | 3 refills | Status: DC

## 2019-12-23 DIAGNOSIS — H401123 Primary open-angle glaucoma, left eye, severe stage: Secondary | ICD-10-CM | POA: Diagnosis not present

## 2019-12-23 DIAGNOSIS — H401114 Primary open-angle glaucoma, right eye, indeterminate stage: Secondary | ICD-10-CM | POA: Diagnosis not present

## 2019-12-26 ENCOUNTER — Other Ambulatory Visit: Payer: Self-pay | Admitting: Cardiology

## 2019-12-26 DIAGNOSIS — I5033 Acute on chronic diastolic (congestive) heart failure: Secondary | ICD-10-CM

## 2019-12-28 ENCOUNTER — Other Ambulatory Visit: Payer: Self-pay | Admitting: Cardiology

## 2019-12-28 DIAGNOSIS — I5033 Acute on chronic diastolic (congestive) heart failure: Secondary | ICD-10-CM

## 2019-12-29 DIAGNOSIS — M1A09X0 Idiopathic chronic gout, multiple sites, without tophus (tophi): Secondary | ICD-10-CM | POA: Diagnosis not present

## 2019-12-29 DIAGNOSIS — M5136 Other intervertebral disc degeneration, lumbar region: Secondary | ICD-10-CM | POA: Diagnosis not present

## 2019-12-29 DIAGNOSIS — E669 Obesity, unspecified: Secondary | ICD-10-CM | POA: Diagnosis not present

## 2019-12-29 DIAGNOSIS — M0609 Rheumatoid arthritis without rheumatoid factor, multiple sites: Secondary | ICD-10-CM | POA: Diagnosis not present

## 2019-12-29 DIAGNOSIS — Z6838 Body mass index (BMI) 38.0-38.9, adult: Secondary | ICD-10-CM | POA: Diagnosis not present

## 2019-12-30 DIAGNOSIS — Z794 Long term (current) use of insulin: Secondary | ICD-10-CM | POA: Diagnosis not present

## 2019-12-30 DIAGNOSIS — E1142 Type 2 diabetes mellitus with diabetic polyneuropathy: Secondary | ICD-10-CM | POA: Diagnosis not present

## 2020-01-02 DIAGNOSIS — Z79899 Other long term (current) drug therapy: Secondary | ICD-10-CM | POA: Diagnosis not present

## 2020-01-16 NOTE — Progress Notes (Signed)
External Labs: Collected: 01/02/2020 BNP 18.8 Magnesium 2.2 Potassium: 4 AST/ALT: 20/24 Creatinine 0.88 mg/dL. eGFR: 99 mL/min per 1.73 m Continue Entresto as initiated last office visit.

## 2020-01-17 NOTE — Progress Notes (Signed)
Patient called back , I have discussed results with him he will be here Friday as scheduled.

## 2020-01-17 NOTE — Progress Notes (Signed)
Called patient, NA, LMAM to callback regarding Lab results.

## 2020-01-20 ENCOUNTER — Ambulatory Visit: Payer: PPO | Admitting: Cardiology

## 2020-01-20 ENCOUNTER — Encounter: Payer: Self-pay | Admitting: Cardiology

## 2020-01-20 ENCOUNTER — Other Ambulatory Visit: Payer: Self-pay

## 2020-01-20 VITALS — BP 119/59 | HR 80 | Ht 70.0 in | Wt 262.0 lb

## 2020-01-20 DIAGNOSIS — R0609 Other forms of dyspnea: Secondary | ICD-10-CM | POA: Diagnosis not present

## 2020-01-20 DIAGNOSIS — I480 Paroxysmal atrial fibrillation: Secondary | ICD-10-CM

## 2020-01-20 DIAGNOSIS — Z6837 Body mass index (BMI) 37.0-37.9, adult: Secondary | ICD-10-CM | POA: Diagnosis not present

## 2020-01-20 DIAGNOSIS — E782 Mixed hyperlipidemia: Secondary | ICD-10-CM | POA: Diagnosis not present

## 2020-01-20 DIAGNOSIS — Z87891 Personal history of nicotine dependence: Secondary | ICD-10-CM

## 2020-01-20 DIAGNOSIS — Z7901 Long term (current) use of anticoagulants: Secondary | ICD-10-CM

## 2020-01-20 DIAGNOSIS — G4733 Obstructive sleep apnea (adult) (pediatric): Secondary | ICD-10-CM

## 2020-01-20 DIAGNOSIS — I5022 Chronic systolic (congestive) heart failure: Secondary | ICD-10-CM | POA: Diagnosis not present

## 2020-01-20 DIAGNOSIS — Z79899 Other long term (current) drug therapy: Secondary | ICD-10-CM | POA: Diagnosis not present

## 2020-01-20 DIAGNOSIS — I10 Essential (primary) hypertension: Secondary | ICD-10-CM

## 2020-01-20 DIAGNOSIS — R0600 Dyspnea, unspecified: Secondary | ICD-10-CM

## 2020-01-20 MED ORDER — ENTRESTO 49-51 MG PO TABS: 2.0000 | ORAL_TABLET | Freq: Two times a day (BID) | ORAL | Status: DC

## 2020-01-20 NOTE — Progress Notes (Signed)
Joshua Lara Date of Birth: [birth year] MRN: [id number] Primary Care Provider:[name], [name] Former Cardiology Providers: Jeri Lager, APRN, FNP-C Primary Cardiologist: Rex Kras, DO, [facility] (established care 10/28/2019)  Date: 01/20/2020 Last Office Visit: 12/21/2019  Chief Complaint  Patient presents with  . Congestive Heart Failure    HPI  Joshua Lara is a 74 y.o.  male who presents to the office with a chief complaint of "shortness of breath and review test results." Patient's past medical history and cardiovascular risk factors include: Paroxysmal atrial fibrillation, oral anticoagulation, long-term antiarrhythmic medication, heart failure with reduced EF, stage C, NYHA class II, hypertension, hyperlipidemia, OSA on CPAP, diabetes mellitus, obesity due to excess calories, advanced age..  Patient is being followed for the management of atrial fibrillation, dyspnea, HFrEF.  In the recent past patient has undergone extensive cardiovascular work-up for his symptoms of dyspnea on exertion.  Echocardiogram noted mildly reduced at 45-50% with indeterminate diastolic filling pattern elevated left atrial pressure.  No significant valvular heart disease or evidence of pulmonary hypertension with RVSP.  His a nuclear stress test was reported to be overall low risk study no reversible ischemia noted but he did have a soft tissue attenuation and apical thinning artifact.  Due to continued symptoms despite an unremarkable work-up we did discuss undergoing left heart catheterization at last office visit.  However, patient stated that she wants to continue with medication management and will seek medical attention if his symptoms were to progress.  At the last office visit he was started on Entresto 49/51 mg p.o. twice daily.  Repeat blood work reviewed.  Patient tolerated the medication well without any side effects or intolerances.  Patient states that his effort related dyspnea has  improved.  He is able to walk and exert himself for a longer period of time and also participate in sexual activity.   ALLERGIES: Allergies  Allergen Reactions  . Bactrim [Sulfamethoxazole-Trimethoprim] Swelling  . Zantac [Ranitidine Hcl] Anaphylaxis    Has anaphylactic shock--intubated and in ICU x 4 days   . Etanercept Other (See Comments) and Cough    (ENBREL); Patient thinks it contributed to heart issues  . Etanercept     Other reaction(s): Cough (ALLERGY/intolerance), Other (See Comments) (ENBREL); Patient thinks it contributed to heart issues  . Tape Itching    Can only tolerate bandages for less than 24-hr periods Can only tolerate bandages for less than 24-hr periods     MEDICATION LIST PRIOR TO VISIT: Current Outpatient Medications on File Prior to Visit  Medication Sig Dispense Refill  . BD PEN NEEDLE NANO 2ND GEN 32G X 4 MM MISC     . bimatoprost (LUMIGAN) 0.01 % SOLN Place 1 drop into both eyes at bedtime.     Marland Kitchen ELIQUIS 5 MG TABS tablet TAKE 1 TABLET BY MOUTH EVERY 12 HOURS 60 tablet 3  . folic acid (FOLVITE) 1 MG tablet Take 1 mg by mouth daily.    Marland Kitchen HUMULIN R U-500 KWIKPEN 500 UNIT/ML kwikpen Inject 125-150 Units into the skin 2 (two) times daily.     Marland Kitchen JARDIANCE 10 MG TABS tablet Take 10 mg by mouth daily.    Marland Kitchen levothyroxine (SYNTHROID, LEVOTHROID) 100 MCG tablet Take 100 mcg by mouth daily.      . methotrexate 2.5 MG tablet Take 5 mg by mouth once a week. $Remov'5mg'cMQPLq$  in Morning, $RemoveBef'5mg'eZgRlXIMUl$  at night    . nitroGLYCERIN (NITROSTAT) 0.4 MG SL tablet Place 0.4 mg under the tongue every 5 (  five) minutes as needed for chest pain.     . pantoprazole (PROTONIX) 40 MG tablet Take 40 mg by mouth 2 (two) times daily.    . Pitavastatin Calcium (LIVALO) 2 MG TABS Take 1 tablet (2 mg total) by mouth daily. 30 tablet 6  . sildenafil (VIAGRA) 100 MG tablet Take 100 mg by mouth every 4 (four) hours as needed.    . sotalol (BETAPACE) 80 MG tablet TAKE 0.5 TABLETS BY MOUTH EVERY MORNING AND TAKE 1  TABLET IN THE EVENING 60 tablet 1  . TRULICITY 4.54 UJ/8.1XB SOPN INJECT 0.75 MG ONCE A WEEK SUBCUTANEOUS 30 DAYS     No current facility-administered medications on file prior to visit.    PAST MEDICAL HISTORY: Past Medical History:  Diagnosis Date  . Arthritis    "hands, back, ankles" (05/11/2014)  . CHF (congestive heart failure) ([place])   . Chronic lower back pain   . Compression fracture of lumbar vertebra ([place])   . Coronary artery disease   . Daily headache    "recently" (05/11/2014)  . Depression    "I might be slight" (05/11/2014)  . GERD (gastroesophageal reflux disease)   . High cholesterol   . Hypertension   . Hypothyroid   . OSA treated with BiPAP   . Pneumonia    "couple times; last time was 03/2011" (05/11/2014)  . Type II diabetes mellitus ([place])     PAST SURGICAL HISTORY: Past Surgical History:  Procedure Laterality Date  . APPENDECTOMY    . CARDIAC CATHETERIZATION  2001; 2009  . CATARACT EXTRACTION W/ INTRAOCULAR LENS IMPLANT Left   . CHOLECYSTECTOMY  1980's   Archie Endo 07/13/2010   . EYE MUSCLE SURGERY Left   . LEFT AND RIGHT HEART CATHETERIZATION WITH CORONARY ANGIOGRAM N/A 05/30/2014   Procedure: LEFT AND RIGHT HEART CATHETERIZATION WITH CORONARY ANGIOGRAM;  Surgeon: Jolaine Artist, MD;  Location: [facility] CATH LAB;  Service: Cardiovascular;  Laterality: N/A;  . LEFT HEART CATH AND CORONARY ANGIOGRAPHY N/A 12/20/2018   Procedure: LEFT HEART CATH AND CORONARY ANGIOGRAPHY and possible intervention;  Surgeon: Adrian Prows, MD;  Location: [place] CV LAB;  Service: Cardiovascular;  Laterality: N/A;  . MASTOIDECTOMY Right 1970's   Archie Endo 07/13/2010  . SHOULDER OPEN ROTATOR CUFF REPAIR Left 07/2010  . SPHINCTEROTOMY    . TONSILLECTOMY AND ADENOIDECTOMY      FAMILY HISTORY: The patient's family history includes Heart attack in his father; Kidney failure in his mother.   SOCIAL HISTORY:  The patient  reports that he quit smoking about 29 years ago. His smoking use  included cigarettes. He has a 60.00 pack-year smoking history. He has never used smokeless tobacco. He reports current alcohol use. He reports that he does not use drugs.  Review of Systems  Constitutional: Negative for chills and fever.  HENT: Negative for hoarse voice and nosebleeds.   Eyes: Negative for discharge, double vision and pain.  Cardiovascular: Positive for dyspnea on exertion (improving). Negative for chest pain, claudication, leg swelling, near-syncope, orthopnea, palpitations, paroxysmal nocturnal dyspnea and syncope.  Respiratory: Negative for hemoptysis and shortness of breath.   Musculoskeletal: Negative for muscle cramps and myalgias.  Gastrointestinal: Negative for abdominal pain, constipation, diarrhea, hematemesis, hematochezia, melena, nausea and vomiting.  Neurological: Negative for dizziness and light-headedness.    PHYSICAL EXAM: Vitals with BMI 01/20/2020 12/21/2019 11/23/2019  Height $Remov'5\' 10"'wecQxK$  $RemoveB'5\' 10"'aUxhjvUY$  -  Weight 262 lbs 264 lbs -  BMI 14.78 29.56 -  Systolic 213 086 -  Diastolic 59  70 -  Pulse 80 83 81   CONSTITUTIONAL: Appropriate Caucasian gentleman, hemodynamically stable, no acute distress. SKIN: Skin is warm and dry. No rash noted. No cyanosis. No pallor. No jaundice HEAD: Normocephalic and atraumatic.  EYES: No scleral icterus MOUTH/THROAT: Moist oral membranes.  NECK: No JVD present. No thyromegaly noted. No carotid bruits  LYMPHATIC: No visible cervical adenopathy.  Dose of CHEST Normal respiratory effort. No intercostal retractions  LUNGS: Clear to auscultation bilaterally. No stridor. No wheezes. No rales.  CARDIOVASCULAR: Regular rate and rhythm, positive S1-S2, no murmurs rubs or gallops appreciated. ABDOMINAL: Obese, soft, nontender, nondistended, positive bowel sounds all 4 quadrants. No apparent ascites.  EXTREMITIES: No peripheral edema  HEMATOLOGIC: No significant bruising NEUROLOGIC: Oriented to person, place, and time. Nonfocal. Normal muscle  tone.  PSYCHIATRIC: Normal mood and affect. Normal behavior. Cooperative  CARDIAC DATABASE: EKG:  10/28/2019: Normal sinus rhythm, 94 bpm, rightward axis, first-degree AV block, poor R wave progression, consider old anterior infarct.   Echocardiogram: 12/09/2019: Mildly depressed LV systolic function with visual EF 45-50%. Left ventricle cavity is normal in size. Mild left ventricular hypertrophy. Normal global wall motion. Indeterminate diastolic filling pattern, elevated LAP. Calculated EF 42%. Mild tricuspid regurgitation. No evidence of pulmonary hypertension. No other significant valvular abnormalities. Compared to previous study 12/19/2018 no significant change.   Stress Testing:  Lexiscan/modified Bruce Tetrofosmin stress test 11/23/2019: Lexiscan/modified Bruce nuclear stress test performed using 1-day protocol. Stress EKG is non-diagnostic, as this is pharmacological stress test. In addition, stress EKG at 71% MPHR showed sinus rhythm, possible old anteroseptal infarct, poor R wave progression, low voltage, no ischemic changes.   Large areas of soft tissue attenuation seen, particularly in vertical long axis images. In addition, Apical thinning seen on both rest and stress images may be physiological. Overall, no reversible ischemia noted. Stress LVEF 49%, although visually appears normal.  Low risk study.   Heart Catheterization: 12/20/2018: Left ventricle is borderline dilated. Mild decrease in LV systolic function with global hypokinesis, EF 45%. Normal LVEDP. Minimal luminal irregularity, no coronary artery disease. Right dominant circulation.   LABORATORY DATA: CBC Latest Ref Rng & Units 11/23/2019 12/16/2018 11/05/2016  WBC 4.0 - 10.5 K/uL 5.5 8.2 10.6(H)  Hemoglobin 13.0 - 17.0 g/dL 14.9 14.1 11.8(L)  Hematocrit 39 - 52 % 44.0 40.4 36.0(L)  Platelets 150 - 400 K/uL 167 194 224    CMP Latest Ref Rng & Units 11/23/2019 12/19/2018 12/17/2018  Glucose 70 - 99 mg/dL 222(H) 144(H)  219(H)  BUN 8 - 23 mg/dL $Remove'16 20 10  'KOKhbBt$ Creatinine 0.61 - 1.24 mg/dL 0.94 1.01 0.99  Sodium 135 - 145 mmol/L 136 136 136  Potassium 3.5 - 5.1 mmol/L 4.1 3.8 3.1(L)  Chloride 98 - 111 mmol/L 102 98 97(L)  CO2 22 - 32 mmol/L $RemoveB'23 26 27  'nCZKPrul$ Calcium 8.9 - 10.3 mg/dL 9.7 9.9 9.8  Total Protein 6.5 - 8.1 g/dL 7.2 - 8.2(H)  Total Bilirubin 0.3 - 1.2 mg/dL 0.9 - 2.0(H)  Alkaline Phos 38 - 126 U/L 99 - 112  AST 15 - 41 U/L 41 - 24  ALT 0 - 44 U/L 46(H) - 27    Lipid Panel     Component Value Date/Time   CHOL 142 12/17/2018 0420   TRIG 109 12/17/2018 0420   HDL 59 12/17/2018 0420   CHOLHDL 2.4 12/17/2018 0420   VLDL 22 12/17/2018 0420   LDLCALC 61 12/17/2018 0420    Lab Results  Component Value Date   HGBA1C 6.4 (  H) 12/17/2018   HGBA1C 6.2 (H) 05/11/2014   HGBA1C 7.3 (H) 04/05/2011   No components found for: NTPROBNP Lab Results  Component Value Date   TSH 3.819 12/17/2018   TSH 1.309 05/11/2014   TSH 1.580 04/05/2011   External Labs: Collected: 10/31/2019 Creatinine 0.84 mg/dL. eGFR: 87 mL/min per 1.73 m Lipid profile: Total cholesterol 138, triglycerides 265, HDL 37, LDL 59 ProBNP: 9.5  External Labs: Collected: 01/02/2020 BNP 18.8 Magnesium 2.2 Potassium: 4 AST/ALT: 20/24 Creatinine 0.88 mg/dL. eGFR: 99 mL/min per 1.73 m  Cardiac Panel (last 3 results) No results for input(s): CKTOTAL, CKMB, TROPONINIHS, RELINDX in the last 72 hours.  IMPRESSION:    ICD-10-CM   1. Dyspnea on exertion  R06.00 Magnesium    Basic metabolic panel    Basic metabolic panel    Magnesium    sacubitril-valsartan (ENTRESTO) 49-51 MG  2. Chronic HFrEF (heart failure with reduced ejection fraction) (HCC)  I50.22 Magnesium    Basic metabolic panel    Basic metabolic panel    Magnesium    sacubitril-valsartan (ENTRESTO) 49-51 MG  3. Paroxysmal atrial fibrillation (HCC)  I48.0   4. Long term (current) use of anticoagulants  Z79.01   5. Long term current use of antiarrhythmic drug  Z79.899    6. Essential hypertension  I10   7. Mixed hyperlipidemia  E78.2   8. OSA treated with BiPAP  G47.33   9. Former smoker  Z87.891   16. Class 2 severe obesity due to excess calories with serious comorbidity and body mass index (BMI) of 37.0 to 37.9 in adult [id number])  E66.01    Z68.37      RECOMMENDATIONS: Joshua Lara is a 74 y.o. male whose past medical history and cardiovascular risk factors include: Paroxysmal atrial fibrillation, oral anticoagulation, long-term antiarrhythmic medication, heart failure with reduced EF, stage C, NYHA class II, hypertension, hyperlipidemia, OSA on CPAP, diabetes mellitus, obesity due to excess calories, advanced age.  Dyspnea on exertion: Multifactorial  Patient recently has undergone an ischemic evaluation including an echocardiogram and nuclear stress test results of which are noted above for further reference.  Patient had a recent left heart catheterization approximately 1 year ago which did not show any significant obstructive coronary disease and nuclear stress test shows no reversible ischemia therefore the shared decision was to proceed with up titration of guideline directed medical therapy.    Patient symptoms are also overall stable and a bit better compared to the last office visit.  We will increase Entresto.  Patient states he just got the prescription refilled.  Therefore recommended to take 49/51 mg 2 tablets twice a day for now.  We will repeat blood work in 1 week to evaluate kidney function and electrolytes and if stable will place another prescription in for Entresto 97/103 mg p.o. twice daily.  We will monitor the patient closely and uptitrate guideline directed medical therapy  Chronic HFrEF, stage C, NYHA class II/III:  Continue current medical therapy.  Recommend daily weight check, strict I/O's  Fluid restriction to <2L per day, Na restriction < 1.5g per day  We will continue to uptitrate guideline directed medical therapy as  hemodynamics and laboratory values allow.  Paroxysmal atrial fibrillation:  CHA2DS2-VASc SCORE is 5 which correlates to 6.7 % risk of stroke per year.  Rate/rhythm control: Sotalol.  Thromboembolic prophylaxis: Eliquis   Long-term oral anticoagulation:  Indication: Paroxysmal atrial fibrillation.  Does not endorse any evidence of bleeding.  Long-term antiarrhythmic medications: Currently on sotalol  for underlying PAF.  Mixed hyperlipidemia:  Currently on Livalo.  Patient was previously on pravastatin which was discontinued secondary to myalgias.    FINAL MEDICATION LIST END OF ENCOUNTER: Meds ordered this encounter  Medications  . sacubitril-valsartan (ENTRESTO) 49-51 MG    Sig: Take 2 tablets by mouth 2 (two) times daily.     Current Outpatient Medications:  .  BD PEN NEEDLE NANO 2ND GEN 32G X 4 MM MISC, , Disp: , Rfl:  .  bimatoprost (LUMIGAN) 0.01 % SOLN, Place 1 drop into both eyes at bedtime. , Disp: , Rfl:  .  ELIQUIS 5 MG TABS tablet, TAKE 1 TABLET BY MOUTH EVERY 12 HOURS, Disp: 60 tablet, Rfl: 3 .  folic acid (FOLVITE) 1 MG tablet, Take 1 mg by mouth daily., Disp: , Rfl:  .  HUMULIN R U-500 KWIKPEN 500 UNIT/ML kwikpen, Inject 125-150 Units into the skin 2 (two) times daily. , Disp: , Rfl:  .  JARDIANCE 10 MG TABS tablet, Take 10 mg by mouth daily., Disp: , Rfl:  .  levothyroxine (SYNTHROID, LEVOTHROID) 100 MCG tablet, Take 100 mcg by mouth daily.  , Disp: , Rfl:  .  methotrexate 2.5 MG tablet, Take 5 mg by mouth once a week. $Remov'5mg'lxEHLY$  in Morning, $RemoveBef'5mg'qUnzMXRdsB$  at night, Disp: , Rfl:  .  nitroGLYCERIN (NITROSTAT) 0.4 MG SL tablet, Place 0.4 mg under the tongue every 5 (five) minutes as needed for chest pain. , Disp: , Rfl:  .  pantoprazole (PROTONIX) 40 MG tablet, Take 40 mg by mouth 2 (two) times daily., Disp: , Rfl:  .  Pitavastatin Calcium (LIVALO) 2 MG TABS, Take 1 tablet (2 mg total) by mouth daily., Disp: 30 tablet, Rfl: 6 .  sacubitril-valsartan (ENTRESTO) 49-51 MG, Take 2  tablets by mouth 2 (two) times daily., Disp: , Rfl:  .  sildenafil (VIAGRA) 100 MG tablet, Take 100 mg by mouth every 4 (four) hours as needed., Disp: , Rfl:  .  sotalol (BETAPACE) 80 MG tablet, TAKE 0.5 TABLETS BY MOUTH EVERY MORNING AND TAKE 1 TABLET IN THE EVENING, Disp: 60 tablet, Rfl: 1 .  TRULICITY 0.26 VZ/8.5YI SOPN, INJECT 0.75 MG ONCE A WEEK SUBCUTANEOUS 30 DAYS, Disp: , Rfl:   Orders Placed This Encounter  Procedures  . Magnesium  . Basic metabolic panel   --Continue cardiac medications as reconciled in final medication list. --Return in about 3 months (around 04/20/2020) for heart failure management.. Or sooner if needed. --Continue follow-up with your primary care physician regarding the management of your other chronic comorbid conditions.  Patient's questions and concerns were addressed to his satisfaction. He voices understanding of the instructions provided during this encounter.   This note was created using a voice recognition software as a result there may be grammatical errors inadvertently enclosed that do not reflect the nature of this encounter. Every attempt is made to correct such errors.  Rex Kras, [place], [facility]  Pager: [phone] Office: [phone]

## 2020-01-30 DIAGNOSIS — I5022 Chronic systolic (congestive) heart failure: Secondary | ICD-10-CM | POA: Diagnosis not present

## 2020-01-30 DIAGNOSIS — R0600 Dyspnea, unspecified: Secondary | ICD-10-CM | POA: Diagnosis not present

## 2020-01-31 ENCOUNTER — Other Ambulatory Visit: Payer: Self-pay

## 2020-01-31 DIAGNOSIS — I5022 Chronic systolic (congestive) heart failure: Secondary | ICD-10-CM

## 2020-01-31 DIAGNOSIS — R0609 Other forms of dyspnea: Secondary | ICD-10-CM

## 2020-01-31 DIAGNOSIS — R0600 Dyspnea, unspecified: Secondary | ICD-10-CM

## 2020-01-31 LAB — BASIC METABOLIC PANEL
BUN/Creatinine Ratio: 10 (ref 10–24)
BUN: 9 mg/dL (ref 8–27)
CO2: 24 mmol/L (ref 20–29)
Calcium: 9.9 mg/dL (ref 8.6–10.2)
Chloride: 103 mmol/L (ref 96–106)
Creatinine, Ser: 0.86 mg/dL (ref 0.76–1.27)
GFR calc Af Amer: 99 mL/min/{1.73_m2} (ref >59)
GFR calc non Af Amer: 86 mL/min/{1.73_m2} (ref >59)
Glucose: 208 mg/dL — ABNORMAL HIGH (ref 65–99)
Potassium: 4.1 mmol/L (ref 3.5–5.2)
Sodium: 140 mmol/L (ref 134–144)

## 2020-01-31 LAB — MAGNESIUM: Magnesium: 1.8 mg/dL (ref 1.6–2.3)

## 2020-01-31 MED ORDER — ENTRESTO 49-51 MG PO TABS
2.0000 | ORAL_TABLET | Freq: Two times a day (BID) | ORAL | 0 refills | Status: DC
Start: 2020-01-31 — End: 2020-02-02

## 2020-01-31 NOTE — Progress Notes (Signed)
Spoke with patient. Patient voiced understanding.

## 2020-02-01 ENCOUNTER — Telehealth: Payer: Self-pay

## 2020-02-01 ENCOUNTER — Other Ambulatory Visit: Payer: Self-pay

## 2020-02-01 NOTE — Telephone Encounter (Signed)
Received call from patient regarding Entresto. Patient is requesting a refill, however he believed he was taking double his dosage and is now out of medicine. Insurance will not cover a script unless the dosage/instructions are changed. Please advise. Thanks!

## 2020-02-02 ENCOUNTER — Other Ambulatory Visit: Payer: Self-pay

## 2020-02-02 MED ORDER — ENTRESTO 97-103 MG PO TABS: 1.0000 | ORAL_TABLET | Freq: Two times a day (BID) | ORAL | 1 refills | Status: DC

## 2020-02-02 NOTE — Telephone Encounter (Signed)
Yes, patient is right. And it was noted during the last office visit.  Please d/c Entresto 49/51mg  po bid.  Send in a script for Entresto 97/103mg  po bid.  Let me know if you have questions.

## 2020-02-02 NOTE — Telephone Encounter (Signed)
Sent in script and relayed information to patient.

## 2020-02-03 DIAGNOSIS — H401134 Primary open-angle glaucoma, bilateral, indeterminate stage: Secondary | ICD-10-CM | POA: Diagnosis not present

## 2020-02-20 DIAGNOSIS — E118 Type 2 diabetes mellitus with unspecified complications: Secondary | ICD-10-CM | POA: Diagnosis not present

## 2020-02-20 DIAGNOSIS — E349 Endocrine disorder, unspecified: Secondary | ICD-10-CM | POA: Diagnosis not present

## 2020-02-20 DIAGNOSIS — R5383 Other fatigue: Secondary | ICD-10-CM | POA: Diagnosis not present

## 2020-02-20 DIAGNOSIS — E039 Hypothyroidism, unspecified: Secondary | ICD-10-CM | POA: Diagnosis not present

## 2020-02-24 DIAGNOSIS — R5383 Other fatigue: Secondary | ICD-10-CM | POA: Diagnosis not present

## 2020-02-24 DIAGNOSIS — Z23 Encounter for immunization: Secondary | ICD-10-CM | POA: Diagnosis not present

## 2020-02-24 DIAGNOSIS — G4733 Obstructive sleep apnea (adult) (pediatric): Secondary | ICD-10-CM | POA: Diagnosis not present

## 2020-02-24 DIAGNOSIS — E662 Morbid (severe) obesity with alveolar hypoventilation: Secondary | ICD-10-CM | POA: Diagnosis not present

## 2020-02-24 DIAGNOSIS — R059 Cough, unspecified: Secondary | ICD-10-CM | POA: Diagnosis not present

## 2020-02-28 DIAGNOSIS — Z6839 Body mass index (BMI) 39.0-39.9, adult: Secondary | ICD-10-CM | POA: Diagnosis not present

## 2020-02-28 DIAGNOSIS — M545 Low back pain, unspecified: Secondary | ICD-10-CM | POA: Diagnosis not present

## 2020-03-02 ENCOUNTER — Other Ambulatory Visit: Payer: Self-pay | Admitting: Cardiology

## 2020-03-02 DIAGNOSIS — I480 Paroxysmal atrial fibrillation: Secondary | ICD-10-CM

## 2020-03-02 NOTE — Telephone Encounter (Signed)
Refill request

## 2020-03-21 ENCOUNTER — Other Ambulatory Visit: Payer: Self-pay | Admitting: Cardiology

## 2020-03-23 DIAGNOSIS — N529 Male erectile dysfunction, unspecified: Secondary | ICD-10-CM | POA: Diagnosis not present

## 2020-03-23 DIAGNOSIS — Z794 Long term (current) use of insulin: Secondary | ICD-10-CM | POA: Diagnosis not present

## 2020-03-23 DIAGNOSIS — E1165 Type 2 diabetes mellitus with hyperglycemia: Secondary | ICD-10-CM | POA: Diagnosis not present

## 2020-03-23 DIAGNOSIS — E1142 Type 2 diabetes mellitus with diabetic polyneuropathy: Secondary | ICD-10-CM | POA: Diagnosis not present

## 2020-03-23 DIAGNOSIS — H409 Unspecified glaucoma: Secondary | ICD-10-CM | POA: Diagnosis not present

## 2020-03-30 DIAGNOSIS — L738 Other specified follicular disorders: Secondary | ICD-10-CM | POA: Diagnosis not present

## 2020-03-30 DIAGNOSIS — E1140 Type 2 diabetes mellitus with diabetic neuropathy, unspecified: Secondary | ICD-10-CM | POA: Diagnosis not present

## 2020-03-30 DIAGNOSIS — Z9181 History of falling: Secondary | ICD-10-CM | POA: Diagnosis not present

## 2020-03-30 DIAGNOSIS — N529 Male erectile dysfunction, unspecified: Secondary | ICD-10-CM | POA: Diagnosis not present

## 2020-03-30 DIAGNOSIS — I2510 Atherosclerotic heart disease of native coronary artery without angina pectoris: Secondary | ICD-10-CM | POA: Diagnosis not present

## 2020-03-30 DIAGNOSIS — Z6839 Body mass index (BMI) 39.0-39.9, adult: Secondary | ICD-10-CM | POA: Diagnosis not present

## 2020-03-30 DIAGNOSIS — Z794 Long term (current) use of insulin: Secondary | ICD-10-CM | POA: Diagnosis not present

## 2020-03-30 DIAGNOSIS — Z139 Encounter for screening, unspecified: Secondary | ICD-10-CM | POA: Diagnosis not present

## 2020-03-30 DIAGNOSIS — N5319 Other ejaculatory dysfunction: Secondary | ICD-10-CM | POA: Diagnosis not present

## 2020-04-09 DIAGNOSIS — Z20822 Contact with and (suspected) exposure to covid-19: Secondary | ICD-10-CM | POA: Diagnosis not present

## 2020-04-20 ENCOUNTER — Ambulatory Visit: Payer: PPO | Admitting: Cardiology

## 2020-04-21 DIAGNOSIS — Z20822 Contact with and (suspected) exposure to covid-19: Secondary | ICD-10-CM | POA: Diagnosis not present

## 2020-04-22 ENCOUNTER — Other Ambulatory Visit: Payer: Self-pay | Admitting: Cardiology

## 2020-04-22 DIAGNOSIS — I5033 Acute on chronic diastolic (congestive) heart failure: Secondary | ICD-10-CM

## 2020-04-24 DIAGNOSIS — Z794 Long term (current) use of insulin: Secondary | ICD-10-CM | POA: Diagnosis not present

## 2020-04-24 DIAGNOSIS — E1129 Type 2 diabetes mellitus with other diabetic kidney complication: Secondary | ICD-10-CM | POA: Diagnosis not present

## 2020-04-24 DIAGNOSIS — R809 Proteinuria, unspecified: Secondary | ICD-10-CM | POA: Diagnosis not present

## 2020-04-24 DIAGNOSIS — I5030 Unspecified diastolic (congestive) heart failure: Secondary | ICD-10-CM | POA: Diagnosis not present

## 2020-04-24 DIAGNOSIS — R059 Cough, unspecified: Secondary | ICD-10-CM | POA: Diagnosis not present

## 2020-04-24 DIAGNOSIS — U071 COVID-19: Secondary | ICD-10-CM | POA: Diagnosis not present

## 2020-04-24 DIAGNOSIS — Z6839 Body mass index (BMI) 39.0-39.9, adult: Secondary | ICD-10-CM | POA: Diagnosis not present

## 2020-04-27 ENCOUNTER — Ambulatory Visit: Payer: PPO | Admitting: Cardiology

## 2020-04-27 ENCOUNTER — Encounter: Payer: Self-pay | Admitting: Cardiology

## 2020-04-27 ENCOUNTER — Other Ambulatory Visit: Payer: Self-pay

## 2020-04-27 VITALS — BP 116/69 | HR 95 | Ht 70.0 in | Wt 270.0 lb

## 2020-04-27 DIAGNOSIS — G4733 Obstructive sleep apnea (adult) (pediatric): Secondary | ICD-10-CM

## 2020-04-27 DIAGNOSIS — Z7901 Long term (current) use of anticoagulants: Secondary | ICD-10-CM

## 2020-04-27 DIAGNOSIS — E782 Mixed hyperlipidemia: Secondary | ICD-10-CM | POA: Diagnosis not present

## 2020-04-27 DIAGNOSIS — I5022 Chronic systolic (congestive) heart failure: Secondary | ICD-10-CM

## 2020-04-27 DIAGNOSIS — R0609 Other forms of dyspnea: Secondary | ICD-10-CM | POA: Diagnosis not present

## 2020-04-27 DIAGNOSIS — Z79899 Other long term (current) drug therapy: Secondary | ICD-10-CM | POA: Diagnosis not present

## 2020-04-27 DIAGNOSIS — Z87891 Personal history of nicotine dependence: Secondary | ICD-10-CM | POA: Diagnosis not present

## 2020-04-27 DIAGNOSIS — Z794 Long term (current) use of insulin: Secondary | ICD-10-CM | POA: Diagnosis not present

## 2020-04-27 DIAGNOSIS — R0600 Dyspnea, unspecified: Secondary | ICD-10-CM

## 2020-04-27 DIAGNOSIS — E119 Type 2 diabetes mellitus without complications: Secondary | ICD-10-CM | POA: Diagnosis not present

## 2020-04-27 DIAGNOSIS — I10 Essential (primary) hypertension: Secondary | ICD-10-CM

## 2020-04-27 DIAGNOSIS — I480 Paroxysmal atrial fibrillation: Secondary | ICD-10-CM

## 2020-04-27 DIAGNOSIS — Z8616 Personal history of COVID-19: Secondary | ICD-10-CM

## 2020-04-27 DIAGNOSIS — Z6838 Body mass index (BMI) 38.0-38.9, adult: Secondary | ICD-10-CM

## 2020-04-27 NOTE — Progress Notes (Signed)
Joshua Lara Date of Birth: [birth year] MRN: [id number] Primary Care Provider:[name], [name] Former Cardiology Providers: Jeri Lager, APRN, FNP-C Primary Cardiologist: Rex Kras, DO, [facility] (established care 10/28/2019)  Date: 04/27/20 Last Office Visit: 01/20/2020  Chief Complaint  Patient presents with  . heart failure  . Follow-up    HPI  Joshua Lara is a 74 y.o.  male who presents to the office with a chief complaint of "25-monthfollow-up for shortness of breath." Patient's past medical history and cardiovascular risk factors include: Paroxysmal atrial fibrillation, oral anticoagulation, long-term antiarrhythmic medication, heart failure with reduced EF, stage C, NYHA class II, hypertension, hyperlipidemia, OSA on CPAP, diabetes mellitus, obesity due to excess calories, advanced age.  Patient is being followed for the management of atrial fibrillation, dyspnea, HFrEF.  Patient has presented to the office multiple times complaining of shortness of breath with effort related activities.  He has undergone extensive cardiovascular work-up including an echocardiogram and nuclear stress test results of which were reviewed with him at the last office visit and noted below for further reference.  Medications were uptitrated in a stepwise fashion.  At the last office visit his EDelene Lollwas increased and a week later blood work showed stable kidney function and electrolytes.  Since last office visit patient states that he is doing well from a cardiovascular standpoint.  He is able to exert himself for longer period of time before getting symptomatic with dyspnea on exertion.  He denies any chest pain at rest or with effort related activities.  Unfortunately, despite being vaccinated for COVID-19 he did contract the infection but his symptoms were short-lived but was self quarantined.  ALLERGIES: Allergies  Allergen Reactions  . Bactrim [Sulfamethoxazole-Trimethoprim] Swelling   . Zantac [Ranitidine Hcl] Anaphylaxis    Has anaphylactic shock--intubated and in ICU x 4 days   . Etanercept Other (See Comments) and Cough    (ENBREL); Patient thinks it contributed to heart issues  . Etanercept     Other reaction(s): Cough (ALLERGY/intolerance), Other (See Comments) (ENBREL); Patient thinks it contributed to heart issues  . Sulfa Antibiotics Other (See Comments)  . Tape Itching    Can only tolerate bandages for less than 24-hr periods Can only tolerate bandages for less than 24-hr periods     MEDICATION LIST PRIOR TO VISIT: Current Outpatient Medications on File Prior to Visit  Medication Sig Dispense Refill  . BD PEN NEEDLE NANO 2ND GEN 32G X 4 MM MISC     . bimatoprost (LUMIGAN) 0.01 % SOLN Place 1 drop into both eyes at bedtime.     .Marland KitchenELIQUIS 5 MG TABS tablet TAKE 1 TABLET BY MOUTH EVERY 12 HOURS 60 tablet 3  . folic acid (FOLVITE) 1 MG tablet Take 1 mg by mouth daily.    .Marland KitchenHUMULIN R U-500 KWIKPEN 500 UNIT/ML kwikpen Inject 125-150 Units into the skin 2 (two) times daily.     .Marland KitchenJARDIANCE 10 MG TABS tablet Take 10 mg by mouth daily.    .Marland Kitchenlevothyroxine (SYNTHROID, LEVOTHROID) 100 MCG tablet Take 100 mcg by mouth daily.    . methotrexate 2.5 MG tablet Take 5 mg by mouth once a week. 550min Morning, 10m77mt night    . nitroGLYCERIN (NITROSTAT) 0.4 MG SL tablet Place 0.4 mg under the tongue every 5 (five) minutes as needed for chest pain.     . pantoprazole (PROTONIX) 40 MG tablet TAKE 1 TABLET BY MOUTH TWICE A DAY 60 tablet 2  .  Pitavastatin Calcium (LIVALO) 2 MG TABS Take 1 tablet (2 mg total) by mouth daily. 30 tablet 6  . sacubitril-valsartan (ENTRESTO) 97-103 MG Take 1 tablet by mouth 2 (two) times daily. 180 tablet 1  . sildenafil (VIAGRA) 100 MG tablet Take 100 mg by mouth every 4 (four) hours as needed.    . sotalol (BETAPACE) 80 MG tablet TAKE 0.5 TABLETS BY MOUTH EVERY MORNING AND TAKE 1 TABLET IN THE EVENING 505 tablet 0  . TRULICITY 3.97 QB/3.4LP SOPN  INJECT 0.75 MG ONCE A WEEK SUBCUTANEOUS 30 DAYS     No current facility-administered medications on file prior to visit.    PAST MEDICAL HISTORY: Past Medical History:  Diagnosis Date  . Arthritis    "hands, back, ankles" (05/11/2014)  . CHF (congestive heart failure) ([place])   . Chronic lower back pain   . Compression fracture of lumbar vertebra ([place])   . Coronary artery disease   . Daily headache    "recently" (05/11/2014)  . Depression    "I might be slight" (05/11/2014)  . GERD (gastroesophageal reflux disease)   . High cholesterol   . Hypertension   . Hypothyroid   . OSA treated with BiPAP   . Pneumonia    "couple times; last time was 03/2011" (05/11/2014)  . Type II diabetes mellitus ([place])     PAST SURGICAL HISTORY: Past Surgical History:  Procedure Laterality Date  . APPENDECTOMY    . CARDIAC CATHETERIZATION  2001; 2009  . CATARACT EXTRACTION W/ INTRAOCULAR LENS IMPLANT Left   . CHOLECYSTECTOMY  1980's   Archie Endo 07/13/2010   . EYE MUSCLE SURGERY Left   . LEFT AND RIGHT HEART CATHETERIZATION WITH CORONARY ANGIOGRAM N/A 05/30/2014   Procedure: LEFT AND RIGHT HEART CATHETERIZATION WITH CORONARY ANGIOGRAM;  Surgeon: Jolaine Artist, MD;  Location: [facility] CATH LAB;  Service: Cardiovascular;  Laterality: N/A;  . LEFT HEART CATH AND CORONARY ANGIOGRAPHY N/A 12/20/2018   Procedure: LEFT HEART CATH AND CORONARY ANGIOGRAPHY and possible intervention;  Surgeon: Adrian Prows, MD;  Location: [facility] CV LAB;  Service: Cardiovascular;  Laterality: N/A;  . MASTOIDECTOMY Right 1970's   Archie Endo 07/13/2010  . SHOULDER OPEN ROTATOR CUFF REPAIR Left 07/2010  . SPHINCTEROTOMY    . TONSILLECTOMY AND ADENOIDECTOMY      FAMILY HISTORY: The patient's family history includes Heart attack in his father; Kidney failure in his mother.   SOCIAL HISTORY:  The patient  reports that he quit smoking about 29 years ago. His smoking use included cigarettes. He has a 60.00 pack-year smoking history. He  has never used smokeless tobacco. He reports current alcohol use. He reports that he does not use drugs.  Review of Systems  Constitutional: Negative for chills and fever.  HENT: Negative for hoarse voice and nosebleeds.   Eyes: Negative for discharge, double vision and pain.  Cardiovascular: Positive for dyspnea on exertion (improving). Negative for chest pain, claudication, leg swelling, near-syncope, orthopnea, palpitations, paroxysmal nocturnal dyspnea and syncope.  Respiratory: Negative for hemoptysis and shortness of breath.   Musculoskeletal: Negative for muscle cramps and myalgias.  Gastrointestinal: Negative for abdominal pain, constipation, diarrhea, hematemesis, hematochezia, melena, nausea and vomiting.  Neurological: Negative for dizziness and light-headedness.    PHYSICAL EXAM: Vitals with BMI 04/27/2020 01/20/2020 12/21/2019  Height _0  _1  _2   Weight 270 lbs 262 lbs 264 lbs  BMI 38.74 37.90 24.09  Systolic 735 329 924  Diastolic 69 59 70  Pulse 95 80 83   CONSTITUTIONAL:  Appropriate Caucasian gentleman, hemodynamically stable, no acute distress. SKIN: Skin is warm and dry. No rash noted. No cyanosis. No pallor. No jaundice HEAD: Normocephalic and atraumatic.  EYES: No scleral icterus MOUTH/THROAT: Moist oral membranes.  NECK: No JVD present. No thyromegaly noted. No carotid bruits  LYMPHATIC: No visible cervical adenopathy. CHEST Normal respiratory effort. No intercostal retractions  LUNGS: Clear to auscultation bilaterally. No stridor. No wheezes. No rales.  CARDIOVASCULAR: Regular rate and rhythm, positive S1-S2, no murmurs rubs or gallops appreciated. ABDOMINAL: Obese, soft, nontender, nondistended, positive bowel sounds all 4 quadrants. No apparent ascites.  EXTREMITIES: No peripheral edema  HEMATOLOGIC: No significant bruising NEUROLOGIC: Oriented to person, place, and time. Nonfocal. Normal muscle tone.  PSYCHIATRIC: Normal mood and affect. Normal  behavior. Cooperative  CARDIAC DATABASE: EKG:  04/27/2020: Normal sinus rhythm, 87 bpm, first degree AV block, poor R wave progression, nonspecific T wave abnormality, right axis deviation, without underlying injury pattern.  Echocardiogram: 12/09/2019: Mildly depressed LV systolic function with visual EF 45-50%. Left ventricle cavity is normal in size. Mild left ventricular hypertrophy. Normal global wall motion. Indeterminate diastolic filling pattern, elevated LAP. Calculated EF 42%. Mild tricuspid regurgitation. No evidence of pulmonary hypertension. No other significant valvular abnormalities. Compared to previous study 12/19/2018 no significant change.   Stress Testing:  Lexiscan/modified Bruce Tetrofosmin stress test 11/23/2019: Lexiscan/modified Bruce nuclear stress test performed using 1-day protocol. Stress EKG is non-diagnostic, as this is pharmacological stress test. In addition, stress EKG at 71% MPHR showed sinus rhythm, possible old anteroseptal infarct, poor R wave progression, low voltage, no ischemic changes.   Large areas of soft tissue attenuation seen, particularly in vertical long axis images. In addition, Apical thinning seen on both rest and stress images may be physiological. Overall, no reversible ischemia noted. Stress LVEF 49%, although visually appears normal.  Low risk study.   Heart Catheterization: 12/20/2018: Left ventricle is borderline dilated. Mild decrease in LV systolic function with global hypokinesis, EF 45%. Normal LVEDP. Minimal luminal irregularity, no coronary artery disease. Right dominant circulation.   LABORATORY DATA: CBC Latest Ref Rng & Units 11/23/2019 12/16/2018 11/05/2016  WBC 4.0 - 10.5 K/uL 5.5 8.2 10.6(H)  Hemoglobin 13.0 - 17.0 g/dL 14.9 14.1 11.8(L)  Hematocrit 39.0 - 52.0 % 44.0 40.4 36.0(L)  Platelets 150 - 400 K/uL 167 194 224    CMP Latest Ref Rng & Units 01/30/2020 11/23/2019 12/19/2018  Glucose 65 - 99 mg/dL 208(H) 222(H)  144(H)  BUN 8 - 27 mg/dL _0 Creatinine 0.76 - 1.27 mg/dL 0.86 0.94 1.01  Sodium 134 - 144 mmol/L 140 136 136  Potassium 3.5 - 5.2 mmol/L 4.1 4.1 3.8  Chloride 96 - 106 mmol/L 103 102 98  CO2 20 - 29 mmol/L _1 Calcium 8.6 - 10.2 mg/dL 9.9 9.7 9.9  Total Protein 6.5 - 8.1 g/dL - 7.2 -  Total Bilirubin 0.3 - 1.2 mg/dL - 0.9 -  Alkaline Phos 38 - 126 U/L - 99 -  AST 15 - 41 U/L - 41 -  ALT 0 - 44 U/L - 46(H) -    Lipid Panel     Component Value Date/Time   CHOL 142 12/17/2018 0420   TRIG 109 12/17/2018 0420   HDL 59 12/17/2018 0420   CHOLHDL 2.4 12/17/2018 0420   VLDL 22 12/17/2018 0420   LDLCALC 61 12/17/2018 0420    Lab Results  Component Value Date   HGBA1C 6.4 (H) 12/17/2018   HGBA1C 6.2 (H) 05/11/2014  HGBA1C 7.3 (H) 04/05/2011   No components found for: NTPROBNP Lab Results  Component Value Date   TSH 3.819 12/17/2018   TSH 1.309 05/11/2014   TSH 1.580 04/05/2011   External Labs: Collected: 10/31/2019 Creatinine 0.84 mg/dL. eGFR: 87 mL/min per 1.73 m Lipid profile: Total cholesterol 138, triglycerides 265, HDL 37, LDL 59 ProBNP: 9.5  External Labs: Collected: 01/02/2020 BNP 18.8 Magnesium 2.2 Potassium: 4 AST/ALT: 20/24 Creatinine 0.88 mg/dL. eGFR: 99 mL/min per 1.73 m  Cardiac Panel (last 3 results) No results for input(s): CKTOTAL, CKMB, TROPONINIHS, RELINDX in the last 72 hours.  IMPRESSION:    ICD-10-CM   1. Chronic HFrEF (heart failure with reduced ejection fraction) (HCC)  I50.22 EKG 12-Lead  2. Dyspnea on exertion  R06.00   3. Paroxysmal atrial fibrillation (HCC)  I48.0   4. Long term (current) use of anticoagulants  Z79.01   5. Long term current use of antiarrhythmic drug  Z79.899   6. Essential hypertension  I10   7. Mixed hyperlipidemia  E78.2   8. OSA treated with BiPAP  G47.33   9. Former smoker  Z87.891   10. Class 2 severe obesity due to excess calories with serious comorbidity and body mass index (BMI) of 38.0 to  38.9 in adult (HCC)  E66.01    Z68.38   11. Type 2 diabetes mellitus without complication, with long-term current use of insulin (HCC)  E11.9    Z79.4   12. Long-term insulin use (HCC)  Z79.4   13. History of COVID-19  Z86.16      RECOMMENDATIONS: Joshua Lara is a 74 y.o. male whose past medical history and cardiovascular risk factors include: Paroxysmal atrial fibrillation, oral anticoagulation, long-term antiarrhythmic medication, heart failure with reduced EF, stage C, NYHA class II, hypertension, hyperlipidemia, OSA on CPAP, diabetes mellitus, obesity due to excess calories, advanced age.  Dyspnea on exertion: Multifactorial  Patient has undergone ischemic evaluation as noted above.  Symptoms have improved after up titration of guideline directed medical therapy.  Independently reviewed labs from February 07, 2020 with the patient at today's visit.  Continue current medical therapy.  Chronic HFrEF, stage C, NYHA class II/III:  Continue current medical therapy.  Recommend daily weight check, strict I/O's  Fluid restriction to <2L per day, Na restriction < 1.5g per day  We will continue to uptitrate guideline directed medical therapy as hemodynamics and laboratory values allow.  Paroxysmal atrial fibrillation:  CHA2DS2-VASc SCORE is 5 which correlates to 6.7 % risk of stroke per year.  Rate/rhythm control: Sotalol.  Thromboembolic prophylaxis: Eliquis   Long-term oral anticoagulation:  Indication: Paroxysmal atrial fibrillation.  Does not endorse any evidence of bleeding.  Long-term antiarrhythmic medications: Currently on sotalol for underlying PAF.  Mixed hyperlipidemia:  Currently on Livalo.  Patient was previously on pravastatin which was discontinued secondary to myalgias.    No changes were made during this office encounter.  Patient is relatively stable from a cardiovascular standpoint.  I will see him back in 6 months for heart failure and dyspnea  management.  Sooner if change in clinical status.  Patient is agreeable with the plan of care.  FINAL MEDICATION LIST END OF ENCOUNTER: No orders of the defined types were placed in this encounter.    Current Outpatient Medications:  .  BD PEN NEEDLE NANO 2ND GEN 32G X 4 MM MISC, , Disp: , Rfl:  .  bimatoprost (LUMIGAN) 0.01 % SOLN, Place 1 drop into both eyes at bedtime. , Disp: ,  Rfl:  .  ELIQUIS 5 MG TABS tablet, TAKE 1 TABLET BY MOUTH EVERY 12 HOURS, Disp: 60 tablet, Rfl: 3 .  folic acid (FOLVITE) 1 MG tablet, Take 1 mg by mouth daily., Disp: , Rfl:  .  HUMULIN R U-500 KWIKPEN 500 UNIT/ML kwikpen, Inject 125-150 Units into the skin 2 (two) times daily. , Disp: , Rfl:  .  JARDIANCE 10 MG TABS tablet, Take 10 mg by mouth daily., Disp: , Rfl:  .  levothyroxine (SYNTHROID, LEVOTHROID) 100 MCG tablet, Take 100 mcg by mouth daily., Disp: , Rfl:  .  methotrexate 2.5 MG tablet, Take 5 mg by mouth once a week. 37m in Morning, 577mat night, Disp: , Rfl:  .  nitroGLYCERIN (NITROSTAT) 0.4 MG SL tablet, Place 0.4 mg under the tongue every 5 (five) minutes as needed for chest pain. , Disp: , Rfl:  .  pantoprazole (PROTONIX) 40 MG tablet, TAKE 1 TABLET BY MOUTH TWICE A DAY, Disp: 60 tablet, Rfl: 2 .  Pitavastatin Calcium (LIVALO) 2 MG TABS, Take 1 tablet (2 mg total) by mouth daily., Disp: 30 tablet, Rfl: 6 .  sacubitril-valsartan (ENTRESTO) 97-103 MG, Take 1 tablet by mouth 2 (two) times daily., Disp: 180 tablet, Rfl: 1 .  sildenafil (VIAGRA) 100 MG tablet, Take 100 mg by mouth every 4 (four) hours as needed., Disp: , Rfl:  .  sotalol (BETAPACE) 80 MG tablet, TAKE 0.5 TABLETS BY MOUTH EVERY MORNING AND TAKE 1 TABLET IN THE EVENING, Disp: 120 tablet, Rfl: 0 .  TRULICITY 0.8.91GQX/4.5WTOPN, INJECT 0.75 MG ONCE A WEEK SUBCUTANEOUS 30 DAYS, Disp: , Rfl:   Orders Placed This Encounter  Procedures  . EKG 12-Lead   --Continue cardiac medications as reconciled in final medication list. --Return in about 6  months (around 10/26/2020) for Follow up, Dyspnea. Or sooner if needed. --Continue follow-up with your primary care physician regarding the management of your other chronic comorbid conditions.  Patient's questions and concerns were addressed to his satisfaction. He voices understanding of the instructions provided during this encounter.   This note was created using a voice recognition software as a result there may be grammatical errors inadvertently enclosed that do not reflect the nature of this encounter. Every attempt is made to correct such errors.  SuRex KrasDONevadaFAPrisma Health Baptist Easley HospitalPager: [phone]: [phone]

## 2020-05-03 ENCOUNTER — Other Ambulatory Visit: Payer: Self-pay | Admitting: Cardiology

## 2020-05-03 DIAGNOSIS — I480 Paroxysmal atrial fibrillation: Secondary | ICD-10-CM

## 2020-05-11 DIAGNOSIS — R059 Cough, unspecified: Secondary | ICD-10-CM | POA: Diagnosis not present

## 2020-05-11 DIAGNOSIS — E662 Morbid (severe) obesity with alveolar hypoventilation: Secondary | ICD-10-CM | POA: Diagnosis not present

## 2020-05-11 DIAGNOSIS — G4733 Obstructive sleep apnea (adult) (pediatric): Secondary | ICD-10-CM | POA: Diagnosis not present

## 2020-05-11 DIAGNOSIS — R5383 Other fatigue: Secondary | ICD-10-CM | POA: Diagnosis not present

## 2020-05-30 DIAGNOSIS — R059 Cough, unspecified: Secondary | ICD-10-CM | POA: Diagnosis not present

## 2020-05-30 DIAGNOSIS — B3749 Other urogenital candidiasis: Secondary | ICD-10-CM | POA: Diagnosis not present

## 2020-05-30 DIAGNOSIS — H6090 Unspecified otitis externa, unspecified ear: Secondary | ICD-10-CM | POA: Diagnosis not present

## 2020-05-30 DIAGNOSIS — K115 Sialolithiasis: Secondary | ICD-10-CM | POA: Diagnosis not present

## 2020-05-30 DIAGNOSIS — Z6839 Body mass index (BMI) 39.0-39.9, adult: Secondary | ICD-10-CM | POA: Diagnosis not present

## 2020-06-04 ENCOUNTER — Other Ambulatory Visit: Payer: Self-pay | Admitting: Cardiology

## 2020-06-04 DIAGNOSIS — I480 Paroxysmal atrial fibrillation: Secondary | ICD-10-CM

## 2020-06-08 DIAGNOSIS — R5383 Other fatigue: Secondary | ICD-10-CM | POA: Diagnosis not present

## 2020-06-08 DIAGNOSIS — R059 Cough, unspecified: Secondary | ICD-10-CM | POA: Diagnosis not present

## 2020-06-08 DIAGNOSIS — E662 Morbid (severe) obesity with alveolar hypoventilation: Secondary | ICD-10-CM | POA: Diagnosis not present

## 2020-06-08 DIAGNOSIS — G4733 Obstructive sleep apnea (adult) (pediatric): Secondary | ICD-10-CM | POA: Diagnosis not present

## 2020-06-08 DIAGNOSIS — Z79899 Other long term (current) drug therapy: Secondary | ICD-10-CM | POA: Diagnosis not present

## 2020-06-08 DIAGNOSIS — J439 Emphysema, unspecified: Secondary | ICD-10-CM | POA: Diagnosis not present

## 2020-06-08 DIAGNOSIS — J189 Pneumonia, unspecified organism: Secondary | ICD-10-CM | POA: Diagnosis not present

## 2020-06-08 DIAGNOSIS — R911 Solitary pulmonary nodule: Secondary | ICD-10-CM | POA: Diagnosis not present

## 2020-06-08 DIAGNOSIS — R053 Chronic cough: Secondary | ICD-10-CM | POA: Diagnosis not present

## 2020-06-11 DIAGNOSIS — M069 Rheumatoid arthritis, unspecified: Secondary | ICD-10-CM | POA: Diagnosis not present

## 2020-06-11 DIAGNOSIS — R059 Cough, unspecified: Secondary | ICD-10-CM | POA: Diagnosis not present

## 2020-06-11 DIAGNOSIS — I480 Paroxysmal atrial fibrillation: Secondary | ICD-10-CM | POA: Diagnosis not present

## 2020-06-11 DIAGNOSIS — Z79899 Other long term (current) drug therapy: Secondary | ICD-10-CM | POA: Diagnosis not present

## 2020-06-11 DIAGNOSIS — Z6839 Body mass index (BMI) 39.0-39.9, adult: Secondary | ICD-10-CM | POA: Diagnosis not present

## 2020-06-11 DIAGNOSIS — I5030 Unspecified diastolic (congestive) heart failure: Secondary | ICD-10-CM | POA: Diagnosis not present

## 2020-06-11 DIAGNOSIS — E039 Hypothyroidism, unspecified: Secondary | ICD-10-CM | POA: Diagnosis not present

## 2020-06-11 DIAGNOSIS — G4733 Obstructive sleep apnea (adult) (pediatric): Secondary | ICD-10-CM | POA: Diagnosis not present

## 2020-06-11 DIAGNOSIS — I2510 Atherosclerotic heart disease of native coronary artery without angina pectoris: Secondary | ICD-10-CM | POA: Diagnosis not present

## 2020-06-11 DIAGNOSIS — I10 Essential (primary) hypertension: Secondary | ICD-10-CM | POA: Diagnosis not present

## 2020-06-11 DIAGNOSIS — E118 Type 2 diabetes mellitus with unspecified complications: Secondary | ICD-10-CM | POA: Diagnosis not present

## 2020-06-29 DIAGNOSIS — Z6838 Body mass index (BMI) 38.0-38.9, adult: Secondary | ICD-10-CM | POA: Diagnosis not present

## 2020-06-29 DIAGNOSIS — Z6839 Body mass index (BMI) 39.0-39.9, adult: Secondary | ICD-10-CM | POA: Diagnosis not present

## 2020-06-29 DIAGNOSIS — R053 Chronic cough: Secondary | ICD-10-CM | POA: Diagnosis not present

## 2020-06-29 DIAGNOSIS — M1A09X0 Idiopathic chronic gout, multiple sites, without tophus (tophi): Secondary | ICD-10-CM | POA: Diagnosis not present

## 2020-06-29 DIAGNOSIS — U099 Post covid-19 condition, unspecified: Secondary | ICD-10-CM | POA: Diagnosis not present

## 2020-06-29 DIAGNOSIS — E669 Obesity, unspecified: Secondary | ICD-10-CM | POA: Diagnosis not present

## 2020-06-29 DIAGNOSIS — M5136 Other intervertebral disc degeneration, lumbar region: Secondary | ICD-10-CM | POA: Diagnosis not present

## 2020-06-29 DIAGNOSIS — M0609 Rheumatoid arthritis without rheumatoid factor, multiple sites: Secondary | ICD-10-CM | POA: Diagnosis not present

## 2020-06-29 DIAGNOSIS — R059 Cough, unspecified: Secondary | ICD-10-CM | POA: Diagnosis not present

## 2020-07-23 ENCOUNTER — Other Ambulatory Visit: Payer: Self-pay | Admitting: Cardiology

## 2020-07-23 DIAGNOSIS — I480 Paroxysmal atrial fibrillation: Secondary | ICD-10-CM

## 2020-08-03 DIAGNOSIS — Z961 Presence of intraocular lens: Secondary | ICD-10-CM | POA: Diagnosis not present

## 2020-08-03 DIAGNOSIS — H53002 Unspecified amblyopia, left eye: Secondary | ICD-10-CM | POA: Diagnosis not present

## 2020-08-03 DIAGNOSIS — H401123 Primary open-angle glaucoma, left eye, severe stage: Secondary | ICD-10-CM | POA: Diagnosis not present

## 2020-08-03 DIAGNOSIS — H401114 Primary open-angle glaucoma, right eye, indeterminate stage: Secondary | ICD-10-CM | POA: Diagnosis not present

## 2020-08-03 DIAGNOSIS — E1136 Type 2 diabetes mellitus with diabetic cataract: Secondary | ICD-10-CM | POA: Diagnosis not present

## 2020-08-03 DIAGNOSIS — H2511 Age-related nuclear cataract, right eye: Secondary | ICD-10-CM | POA: Diagnosis not present

## 2020-08-03 DIAGNOSIS — Z794 Long term (current) use of insulin: Secondary | ICD-10-CM | POA: Diagnosis not present

## 2020-08-13 DIAGNOSIS — L300 Nummular dermatitis: Secondary | ICD-10-CM | POA: Diagnosis not present

## 2020-08-17 DIAGNOSIS — D692 Other nonthrombocytopenic purpura: Secondary | ICD-10-CM | POA: Diagnosis not present

## 2020-08-17 DIAGNOSIS — D6869 Other thrombophilia: Secondary | ICD-10-CM | POA: Diagnosis not present

## 2020-08-17 DIAGNOSIS — Z6838 Body mass index (BMI) 38.0-38.9, adult: Secondary | ICD-10-CM | POA: Diagnosis not present

## 2020-08-17 DIAGNOSIS — J449 Chronic obstructive pulmonary disease, unspecified: Secondary | ICD-10-CM | POA: Diagnosis not present

## 2020-08-17 DIAGNOSIS — I10 Essential (primary) hypertension: Secondary | ICD-10-CM | POA: Diagnosis not present

## 2020-08-17 DIAGNOSIS — E785 Hyperlipidemia, unspecified: Secondary | ICD-10-CM | POA: Diagnosis not present

## 2020-08-17 DIAGNOSIS — Z7984 Long term (current) use of oral hypoglycemic drugs: Secondary | ICD-10-CM | POA: Diagnosis not present

## 2020-08-17 DIAGNOSIS — E1151 Type 2 diabetes mellitus with diabetic peripheral angiopathy without gangrene: Secondary | ICD-10-CM | POA: Diagnosis not present

## 2020-08-17 DIAGNOSIS — I4891 Unspecified atrial fibrillation: Secondary | ICD-10-CM | POA: Diagnosis not present

## 2020-08-17 DIAGNOSIS — G4733 Obstructive sleep apnea (adult) (pediatric): Secondary | ICD-10-CM | POA: Diagnosis not present

## 2020-08-17 DIAGNOSIS — Z7901 Long term (current) use of anticoagulants: Secondary | ICD-10-CM | POA: Diagnosis not present

## 2020-08-31 DIAGNOSIS — N5201 Erectile dysfunction due to arterial insufficiency: Secondary | ICD-10-CM | POA: Diagnosis not present

## 2020-08-31 DIAGNOSIS — N401 Enlarged prostate with lower urinary tract symptoms: Secondary | ICD-10-CM | POA: Diagnosis not present

## 2020-08-31 DIAGNOSIS — N3943 Post-void dribbling: Secondary | ICD-10-CM | POA: Diagnosis not present

## 2020-09-02 ENCOUNTER — Other Ambulatory Visit: Payer: Self-pay | Admitting: Cardiology

## 2020-09-05 ENCOUNTER — Other Ambulatory Visit: Payer: Self-pay

## 2020-09-07 DIAGNOSIS — E1151 Type 2 diabetes mellitus with diabetic peripheral angiopathy without gangrene: Secondary | ICD-10-CM | POA: Diagnosis not present

## 2020-09-07 DIAGNOSIS — J302 Other seasonal allergic rhinitis: Secondary | ICD-10-CM | POA: Diagnosis not present

## 2020-09-07 DIAGNOSIS — D692 Other nonthrombocytopenic purpura: Secondary | ICD-10-CM | POA: Diagnosis not present

## 2020-09-07 DIAGNOSIS — D6869 Other thrombophilia: Secondary | ICD-10-CM | POA: Diagnosis not present

## 2020-09-07 DIAGNOSIS — J449 Chronic obstructive pulmonary disease, unspecified: Secondary | ICD-10-CM | POA: Diagnosis not present

## 2020-09-07 DIAGNOSIS — G4733 Obstructive sleep apnea (adult) (pediatric): Secondary | ICD-10-CM | POA: Diagnosis not present

## 2020-09-07 DIAGNOSIS — E785 Hyperlipidemia, unspecified: Secondary | ICD-10-CM | POA: Diagnosis not present

## 2020-09-07 DIAGNOSIS — Z6838 Body mass index (BMI) 38.0-38.9, adult: Secondary | ICD-10-CM | POA: Diagnosis not present

## 2020-09-07 DIAGNOSIS — I4891 Unspecified atrial fibrillation: Secondary | ICD-10-CM | POA: Diagnosis not present

## 2020-09-07 DIAGNOSIS — Z7901 Long term (current) use of anticoagulants: Secondary | ICD-10-CM | POA: Diagnosis not present

## 2020-09-07 DIAGNOSIS — I10 Essential (primary) hypertension: Secondary | ICD-10-CM | POA: Diagnosis not present

## 2020-09-10 ENCOUNTER — Telehealth: Payer: Self-pay

## 2020-09-10 NOTE — Telephone Encounter (Signed)
Pt was given samples and co pay card for Livalo.

## 2020-09-10 NOTE — Telephone Encounter (Signed)
Thanks for arranging his medications.

## 2020-09-18 ENCOUNTER — Other Ambulatory Visit: Payer: Self-pay | Admitting: Cardiology

## 2020-09-18 DIAGNOSIS — I5033 Acute on chronic diastolic (congestive) heart failure: Secondary | ICD-10-CM

## 2020-09-30 DIAGNOSIS — K7460 Unspecified cirrhosis of liver: Secondary | ICD-10-CM | POA: Diagnosis not present

## 2020-09-30 DIAGNOSIS — E119 Type 2 diabetes mellitus without complications: Secondary | ICD-10-CM | POA: Diagnosis not present

## 2020-09-30 DIAGNOSIS — M549 Dorsalgia, unspecified: Secondary | ICD-10-CM | POA: Diagnosis not present

## 2020-09-30 DIAGNOSIS — S20229A Contusion of unspecified back wall of thorax, initial encounter: Secondary | ICD-10-CM | POA: Diagnosis not present

## 2020-09-30 DIAGNOSIS — Z6837 Body mass index (BMI) 37.0-37.9, adult: Secondary | ICD-10-CM | POA: Diagnosis not present

## 2020-09-30 DIAGNOSIS — I2510 Atherosclerotic heart disease of native coronary artery without angina pectoris: Secondary | ICD-10-CM | POA: Diagnosis not present

## 2020-09-30 DIAGNOSIS — Z043 Encounter for examination and observation following other accident: Secondary | ICD-10-CM | POA: Diagnosis not present

## 2020-09-30 DIAGNOSIS — I712 Thoracic aortic aneurysm, without rupture: Secondary | ICD-10-CM | POA: Diagnosis not present

## 2020-09-30 DIAGNOSIS — S20412A Abrasion of left back wall of thorax, initial encounter: Secondary | ICD-10-CM | POA: Diagnosis not present

## 2020-09-30 DIAGNOSIS — T797XXA Traumatic subcutaneous emphysema, initial encounter: Secondary | ICD-10-CM | POA: Diagnosis not present

## 2020-09-30 DIAGNOSIS — Z794 Long term (current) use of insulin: Secondary | ICD-10-CM | POA: Diagnosis not present

## 2020-09-30 DIAGNOSIS — J439 Emphysema, unspecified: Secondary | ICD-10-CM | POA: Diagnosis not present

## 2020-09-30 DIAGNOSIS — G8929 Other chronic pain: Secondary | ICD-10-CM | POA: Diagnosis not present

## 2020-09-30 DIAGNOSIS — I700 Atherosclerosis of aorta: Secondary | ICD-10-CM | POA: Diagnosis not present

## 2020-09-30 DIAGNOSIS — E039 Hypothyroidism, unspecified: Secondary | ICD-10-CM | POA: Diagnosis not present

## 2020-09-30 DIAGNOSIS — E7800 Pure hypercholesterolemia, unspecified: Secondary | ICD-10-CM | POA: Diagnosis not present

## 2020-09-30 DIAGNOSIS — Z7901 Long term (current) use of anticoagulants: Secondary | ICD-10-CM | POA: Diagnosis not present

## 2020-09-30 DIAGNOSIS — M109 Gout, unspecified: Secondary | ICD-10-CM | POA: Diagnosis not present

## 2020-09-30 DIAGNOSIS — E785 Hyperlipidemia, unspecified: Secondary | ICD-10-CM | POA: Diagnosis not present

## 2020-09-30 DIAGNOSIS — I10 Essential (primary) hypertension: Secondary | ICD-10-CM | POA: Diagnosis not present

## 2020-09-30 DIAGNOSIS — S2242XA Multiple fractures of ribs, left side, initial encounter for closed fracture: Secondary | ICD-10-CM | POA: Diagnosis not present

## 2020-09-30 DIAGNOSIS — I4891 Unspecified atrial fibrillation: Secondary | ICD-10-CM | POA: Diagnosis not present

## 2020-10-02 DIAGNOSIS — S2242XA Multiple fractures of ribs, left side, initial encounter for closed fracture: Secondary | ICD-10-CM | POA: Diagnosis not present

## 2020-10-02 DIAGNOSIS — L309 Dermatitis, unspecified: Secondary | ICD-10-CM | POA: Diagnosis not present

## 2020-10-02 DIAGNOSIS — I480 Paroxysmal atrial fibrillation: Secondary | ICD-10-CM | POA: Diagnosis not present

## 2020-10-02 DIAGNOSIS — G4733 Obstructive sleep apnea (adult) (pediatric): Secondary | ICD-10-CM | POA: Diagnosis not present

## 2020-10-02 DIAGNOSIS — Z6841 Body Mass Index (BMI) 40.0 and over, adult: Secondary | ICD-10-CM | POA: Diagnosis not present

## 2020-10-02 DIAGNOSIS — Z79899 Other long term (current) drug therapy: Secondary | ICD-10-CM | POA: Diagnosis not present

## 2020-10-02 DIAGNOSIS — E118 Type 2 diabetes mellitus with unspecified complications: Secondary | ICD-10-CM | POA: Diagnosis not present

## 2020-10-12 DIAGNOSIS — H524 Presbyopia: Secondary | ICD-10-CM | POA: Diagnosis not present

## 2020-10-12 DIAGNOSIS — Z794 Long term (current) use of insulin: Secondary | ICD-10-CM | POA: Diagnosis not present

## 2020-10-12 DIAGNOSIS — H409 Unspecified glaucoma: Secondary | ICD-10-CM | POA: Diagnosis not present

## 2020-10-12 DIAGNOSIS — E1142 Type 2 diabetes mellitus with diabetic polyneuropathy: Secondary | ICD-10-CM | POA: Diagnosis not present

## 2020-10-12 DIAGNOSIS — H2511 Age-related nuclear cataract, right eye: Secondary | ICD-10-CM | POA: Diagnosis not present

## 2020-10-12 DIAGNOSIS — E1136 Type 2 diabetes mellitus with diabetic cataract: Secondary | ICD-10-CM | POA: Diagnosis not present

## 2020-10-12 DIAGNOSIS — H401134 Primary open-angle glaucoma, bilateral, indeterminate stage: Secondary | ICD-10-CM | POA: Diagnosis not present

## 2020-10-19 DIAGNOSIS — R5383 Other fatigue: Secondary | ICD-10-CM | POA: Diagnosis not present

## 2020-10-19 DIAGNOSIS — R059 Cough, unspecified: Secondary | ICD-10-CM | POA: Diagnosis not present

## 2020-10-19 DIAGNOSIS — E662 Morbid (severe) obesity with alveolar hypoventilation: Secondary | ICD-10-CM | POA: Diagnosis not present

## 2020-10-19 DIAGNOSIS — G4733 Obstructive sleep apnea (adult) (pediatric): Secondary | ICD-10-CM | POA: Diagnosis not present

## 2020-10-22 ENCOUNTER — Other Ambulatory Visit: Payer: Self-pay | Admitting: Cardiology

## 2020-10-22 DIAGNOSIS — I480 Paroxysmal atrial fibrillation: Secondary | ICD-10-CM

## 2020-10-26 ENCOUNTER — Ambulatory Visit: Payer: PPO | Admitting: Cardiology

## 2020-10-26 DIAGNOSIS — M0609 Rheumatoid arthritis without rheumatoid factor, multiple sites: Secondary | ICD-10-CM | POA: Diagnosis not present

## 2020-10-26 DIAGNOSIS — M5136 Other intervertebral disc degeneration, lumbar region: Secondary | ICD-10-CM | POA: Diagnosis not present

## 2020-10-26 DIAGNOSIS — Z6839 Body mass index (BMI) 39.0-39.9, adult: Secondary | ICD-10-CM | POA: Diagnosis not present

## 2020-10-26 DIAGNOSIS — U099 Post covid-19 condition, unspecified: Secondary | ICD-10-CM | POA: Diagnosis not present

## 2020-10-26 DIAGNOSIS — R053 Chronic cough: Secondary | ICD-10-CM | POA: Diagnosis not present

## 2020-10-26 DIAGNOSIS — E669 Obesity, unspecified: Secondary | ICD-10-CM | POA: Diagnosis not present

## 2020-10-26 DIAGNOSIS — M1A09X0 Idiopathic chronic gout, multiple sites, without tophus (tophi): Secondary | ICD-10-CM | POA: Diagnosis not present

## 2020-11-02 DIAGNOSIS — H43813 Vitreous degeneration, bilateral: Secondary | ICD-10-CM | POA: Diagnosis not present

## 2020-11-02 DIAGNOSIS — H2511 Age-related nuclear cataract, right eye: Secondary | ICD-10-CM | POA: Diagnosis not present

## 2020-11-02 DIAGNOSIS — E119 Type 2 diabetes mellitus without complications: Secondary | ICD-10-CM | POA: Diagnosis not present

## 2020-11-02 DIAGNOSIS — H401134 Primary open-angle glaucoma, bilateral, indeterminate stage: Secondary | ICD-10-CM | POA: Diagnosis not present

## 2020-11-09 DIAGNOSIS — Z6839 Body mass index (BMI) 39.0-39.9, adult: Secondary | ICD-10-CM | POA: Diagnosis not present

## 2020-11-09 DIAGNOSIS — M069 Rheumatoid arthritis, unspecified: Secondary | ICD-10-CM | POA: Diagnosis not present

## 2020-11-23 ENCOUNTER — Ambulatory Visit: Payer: PPO | Admitting: Cardiology

## 2020-11-23 ENCOUNTER — Other Ambulatory Visit: Payer: Self-pay

## 2020-11-23 ENCOUNTER — Encounter: Payer: Self-pay | Admitting: Cardiology

## 2020-11-23 VITALS — BP 112/76 | HR 82 | Temp 97.4°F | Resp 16 | Ht 70.0 in | Wt 267.0 lb

## 2020-11-23 DIAGNOSIS — I5022 Chronic systolic (congestive) heart failure: Secondary | ICD-10-CM

## 2020-11-23 DIAGNOSIS — I480 Paroxysmal atrial fibrillation: Secondary | ICD-10-CM | POA: Diagnosis not present

## 2020-11-23 DIAGNOSIS — Z7901 Long term (current) use of anticoagulants: Secondary | ICD-10-CM | POA: Diagnosis not present

## 2020-11-23 DIAGNOSIS — Z794 Long term (current) use of insulin: Secondary | ICD-10-CM | POA: Diagnosis not present

## 2020-11-23 DIAGNOSIS — Z87891 Personal history of nicotine dependence: Secondary | ICD-10-CM

## 2020-11-23 DIAGNOSIS — R0609 Other forms of dyspnea: Secondary | ICD-10-CM | POA: Diagnosis not present

## 2020-11-23 DIAGNOSIS — Z8616 Personal history of COVID-19: Secondary | ICD-10-CM

## 2020-11-23 DIAGNOSIS — Z6838 Body mass index (BMI) 38.0-38.9, adult: Secondary | ICD-10-CM

## 2020-11-23 DIAGNOSIS — E782 Mixed hyperlipidemia: Secondary | ICD-10-CM | POA: Diagnosis not present

## 2020-11-23 DIAGNOSIS — I10 Essential (primary) hypertension: Secondary | ICD-10-CM

## 2020-11-23 DIAGNOSIS — G4733 Obstructive sleep apnea (adult) (pediatric): Secondary | ICD-10-CM | POA: Diagnosis not present

## 2020-11-23 DIAGNOSIS — Z79899 Other long term (current) drug therapy: Secondary | ICD-10-CM | POA: Diagnosis not present

## 2020-11-23 DIAGNOSIS — E119 Type 2 diabetes mellitus without complications: Secondary | ICD-10-CM

## 2020-11-23 DIAGNOSIS — R0600 Dyspnea, unspecified: Secondary | ICD-10-CM

## 2020-11-23 MED ORDER — NITROGLYCERIN 0.4 MG SL SUBL: 0.4000 mg | SUBLINGUAL_TABLET | SUBLINGUAL | 0 refills | Status: AC | PRN

## 2020-11-23 NOTE — Progress Notes (Signed)
Joshua Lara Date of Birth: [birth year] MRN: [id number] Primary Care Provider:[name], [name] Former Cardiology Providers: Jeri Lager, APRN, FNP-C Primary Cardiologist: Rex Kras, DO, [facility] (established care 10/28/2019)  Date: 11/23/20 Last Office Visit: 04/27/2020  Chief Complaint  Patient presents with   Dyspnea on exertion   Follow-up    HPI  Joshua Lara is a 75 y.o.  male who presents to the office with a chief complaint of "45-month follow-up for shortness of breath." Patient's past medical history and cardiovascular risk factors include: Paroxysmal atrial fibrillation, oral anticoagulation, long-term antiarrhythmic medication, heart failure with reduced EF, stage C, NYHA class II, hypertension, hyperlipidemia, OSA on CPAP, diabetes mellitus, obesity due to excess calories, advanced age.  Patient presents for 63-month follow-up and states that he is doing well from a cardiovascular standpoint.  No recent hospitalizations or urgent care visits for cardiovascular symptoms.  Patient states that when he wakes up morning he notices some substernal discomfort that lasts for less than 10 minutes and is usually self-limited.  This usually occurs once or twice a month.  The symptoms are now brought on by effort related activities.  He has not used sublingual nitroglycerin tablets.  Patient states that his shortness of breath remains chronic and stable.  He is lost approximately 3 pounds since last office visit.  He remains overall euvolemic.  Patient states that approximately 3 weeks ago he was having generalized joint pain, fatigue, muscle aches and pain and decided to come off of Livalo and since then his symptoms have substantially resolved.  He chooses not to be on statin therapies as he has been intolerant to them in the past.  ALLERGIES: Allergies  Allergen Reactions   Bactrim [Sulfamethoxazole-Trimethoprim] Swelling   Zantac [Ranitidine Hcl] Anaphylaxis    Has  anaphylactic shock--intubated and in ICU x 4 days    Etanercept Other (See Comments) and Cough    (ENBREL); Patient thinks it contributed to heart issues   Etanercept     Other reaction(s): Cough (ALLERGY/intolerance), Other (See Comments) (ENBREL); Patient thinks it contributed to heart issues   Sulfa Antibiotics Other (See Comments)   Tape Itching    Can only tolerate bandages for less than 24-hr periods Can only tolerate bandages for less than 24-hr periods     MEDICATION LIST PRIOR TO VISIT: Current Outpatient Medications on File Prior to Visit  Medication Sig Dispense Refill   BD PEN NEEDLE NANO 2ND GEN 32G X 4 MM MISC      bimatoprost (LUMIGAN) 0.01 % SOLN Place 1 drop into both eyes at bedtime.      ELIQUIS 5 MG TABS tablet TAKE 1 TABLET BY MOUTH EVERY 12 HOURS 60 tablet 3   ENTRESTO 97-103 MG TAKE 1 TABLET BY MOUTH TWICE A DAY 60 tablet 5   folic acid (FOLVITE) 1 MG tablet Take 1 mg by mouth daily.     HUMULIN R U-500 KWIKPEN 500 UNIT/ML kwikpen Inject 125-150 Units into the skin 2 (two) times daily.      JARDIANCE 10 MG TABS tablet Take 10 mg by mouth daily.     levothyroxine (SYNTHROID, LEVOTHROID) 100 MCG tablet Take 100 mcg by mouth daily.     methotrexate 2.5 MG tablet Take 5 mg by mouth once a week. $Remov'5mg'zqYZvX$  in Morning, $RemoveBef'5mg'unrmZHoUza$  at night     pantoprazole (PROTONIX) 40 MG tablet TAKE 1 TABLET BY MOUTH TWICE A DAY 180 tablet 0   sildenafil (VIAGRA) 100 MG tablet Take 100 mg by  mouth every 4 (four) hours as needed.     sotalol (BETAPACE) 80 MG tablet TAKE 0.5 TABLETS BY MOUTH EVERY MORNING AND TAKE 1 TABLET IN THE EVENING 846 tablet 0   TRULICITY 6.59 DJ/5.7SV SOPN INJECT 0.75 MG ONCE A WEEK SUBCUTANEOUS 30 DAYS     No current facility-administered medications on file prior to visit.    PAST MEDICAL HISTORY: Past Medical History:  Diagnosis Date   Arthritis    "hands, back, ankles" (05/11/2014)   CHF (congestive heart failure) (HCC)    Chronic lower back pain    Compression  fracture of lumbar vertebra (HCC)    Coronary artery disease    Daily headache    "recently" (05/11/2014)   Depression    "I might be slight" (05/11/2014)   GERD (gastroesophageal reflux disease)    High cholesterol    Hypertension    Hypothyroid    OSA treated with BiPAP    Pneumonia    "couple times; last time was 03/2011" (05/11/2014)   Type II diabetes mellitus ([place])     PAST SURGICAL HISTORY: Past Surgical History:  Procedure Laterality Date   APPENDECTOMY     CARDIAC CATHETERIZATION  2001; 2009   CATARACT EXTRACTION W/ INTRAOCULAR LENS IMPLANT Left    CHOLECYSTECTOMY  1980's   Archie Endo 07/13/2010    EYE MUSCLE SURGERY Left    LEFT AND RIGHT HEART CATHETERIZATION WITH CORONARY ANGIOGRAM N/A 05/30/2014   Procedure: LEFT AND RIGHT HEART CATHETERIZATION WITH CORONARY ANGIOGRAM;  Surgeon: Jolaine Artist, MD;  Location: [facility] CATH LAB;  Service: Cardiovascular;  Laterality: N/A;   LEFT HEART CATH AND CORONARY ANGIOGRAPHY N/A 12/20/2018   Procedure: LEFT HEART CATH AND CORONARY ANGIOGRAPHY and possible intervention;  Surgeon: Adrian Prows, MD;  Location: [place] CV LAB;  Service: Cardiovascular;  Laterality: N/A;   MASTOIDECTOMY Right 1970's   Archie Endo 07/13/2010   SHOULDER OPEN ROTATOR CUFF REPAIR Left 07/2010   SPHINCTEROTOMY     TONSILLECTOMY AND ADENOIDECTOMY      FAMILY HISTORY: The patient's family history includes Heart attack in his father; Kidney failure in his mother.   SOCIAL HISTORY:  The patient  reports that he quit smoking about 30 years ago. His smoking use included cigarettes. He has a 60.00 pack-year smoking history. He has never used smokeless tobacco. He reports current alcohol use. He reports that he does not use drugs.  Review of Systems  Constitutional: Negative for chills and fever.  HENT:  Negative for hoarse voice and nosebleeds.   Eyes:  Negative for discharge, double vision and pain.  Cardiovascular:  Positive for dyspnea on exertion (improving).  Negative for chest pain, claudication, leg swelling, near-syncope, orthopnea, palpitations, paroxysmal nocturnal dyspnea and syncope.  Respiratory:  Negative for hemoptysis and shortness of breath.   Musculoskeletal:  Negative for muscle cramps and myalgias.  Gastrointestinal:  Negative for abdominal pain, constipation, diarrhea, hematemesis, hematochezia, melena, nausea and vomiting.  Neurological:  Positive for vertigo. Negative for dizziness and light-headedness.   PHYSICAL EXAM: Vitals with BMI 11/23/2020 04/27/2020 01/20/2020  Height $Remov'5\' 10"'pVDqrn$  $RemoveB'5\' 10"'qbufsxeb$  $RemoveBef'5\' 10"'KQOkMNxCpk$   Weight 267 lbs 270 lbs 262 lbs  BMI 38.31 77.93 90.30  Systolic 092 330 076  Diastolic 76 69 59  Pulse 82 95 80   CONSTITUTIONAL: Caucasian gentleman, hemodynamically stable, no acute distress. SKIN: Skin is warm and dry. No rash noted. No cyanosis. No pallor. No jaundice HEAD: Normocephalic and atraumatic.  EYES: No scleral icterus MOUTH/THROAT: Moist oral membranes.  NECK: No  JVD present. No thyromegaly noted. No carotid bruits  LYMPHATIC: No visible cervical adenopathy. CHEST Normal respiratory effort. No intercostal retractions  LUNGS: Clear to auscultation bilaterally. No stridor. No wheezes. No rales.  CARDIOVASCULAR: Regular rate and rhythm, positive S1-S2, no murmurs rubs or gallops appreciated. ABDOMINAL: Obese, soft, nontender, nondistended, positive bowel sounds all 4 quadrants. No apparent ascites.  EXTREMITIES: No peripheral edema  HEMATOLOGIC: No significant bruising NEUROLOGIC: Oriented to person, place, and time. Nonfocal. Normal muscle tone.  PSYCHIATRIC: Normal mood and affect. Normal behavior. Cooperative  CARDIAC DATABASE: EKG:  11/23/2020: Normal sinus rhythm, 100 bpm, first-degree AV block, nonspecific T wave abnormality.  Echocardiogram: 12/09/2019: Mildly depressed LV systolic function with visual EF 45-50%. Left ventricle cavity is normal in size. Mild left ventricular hypertrophy. Normal global wall  motion. Indeterminate diastolic filling pattern, elevated LAP.  Mild tricuspid regurgitation. No evidence of pulmonary hypertension. No other significant valvular abnormalities. Compared to previous study 12/19/2018 no significant change.   Stress Testing:  Lexiscan/modified Bruce Tetrofosmin stress test 11/23/2019: Lexiscan/modified Bruce nuclear stress test performed using 1-day protocol. Stress EKG is non-diagnostic, as this is pharmacological stress test. In addition, stress EKG at 71% MPHR showed sinus rhythm, possible old anteroseptal infarct, poor R wave progression, low voltage, no ischemic changes.   Large areas of soft tissue attenuation seen, particularly in vertical long axis images. In addition, Apical thinning seen on both rest and stress images may be physiological. Overall, no reversible ischemia noted. Stress LVEF 49%, although visually appears normal.  Low risk study.   Heart Catheterization: 12/20/2018: Left ventricle is borderline dilated.  Mild decrease in LV systolic function with global hypokinesis, EF 45%.  Normal LVEDP. Minimal luminal irregularity, no coronary artery disease.  Right dominant circulation.   LABORATORY DATA: CBC Latest Ref Rng & Units 11/23/2019 12/16/2018 11/05/2016  WBC 4.0 - 10.5 K/uL 5.5 8.2 10.6(H)  Hemoglobin 13.0 - 17.0 g/dL 14.9 14.1 11.8(L)  Hematocrit 39.0 - 52.0 % 44.0 40.4 36.0(L)  Platelets 150 - 400 K/uL 167 194 224    CMP Latest Ref Rng & Units 01/30/2020 11/23/2019 12/19/2018  Glucose 65 - 99 mg/dL 208(H) 222(H) 144(H)  BUN 8 - 27 mg/dL $Remove'9 16 20  'YPgnVph$ Creatinine 0.76 - 1.27 mg/dL 0.86 0.94 1.01  Sodium 134 - 144 mmol/L 140 136 136  Potassium 3.5 - 5.2 mmol/L 4.1 4.1 3.8  Chloride 96 - 106 mmol/L 103 102 98  CO2 20 - 29 mmol/L $RemoveB'24 23 26  'sLdDAiwe$ Calcium 8.6 - 10.2 mg/dL 9.9 9.7 9.9  Total Protein 6.5 - 8.1 g/dL - 7.2 -  Total Bilirubin 0.3 - 1.2 mg/dL - 0.9 -  Alkaline Phos 38 - 126 U/L - 99 -  AST 15 - 41 U/L - 41 -  ALT 0 - 44 U/L - 46(H) -     Lipid Panel     Component Value Date/Time   CHOL 142 12/17/2018 0420   TRIG 109 12/17/2018 0420   HDL 59 12/17/2018 0420   CHOLHDL 2.4 12/17/2018 0420   VLDL 22 12/17/2018 0420   LDLCALC 61 12/17/2018 0420    Lab Results  Component Value Date   HGBA1C 6.4 (H) 12/17/2018   HGBA1C 6.2 (H) 05/11/2014   HGBA1C 7.3 (H) 04/05/2011   No components found for: NTPROBNP Lab Results  Component Value Date   TSH 3.819 12/17/2018   TSH 1.309 05/11/2014   TSH 1.580 04/05/2011   External Labs: Collected: 10/31/2019 Creatinine 0.84 mg/dL. eGFR: 87 mL/min per 1.73 m Lipid  profile: Total cholesterol 138, triglycerides 265, HDL 37, LDL 59 ProBNP: 9.5  External Labs: Collected: 01/02/2020 BNP 18.8 Magnesium 2.2 Potassium: 4 AST/ALT: 20/24 Creatinine 0.88 mg/dL. eGFR: 99 mL/min per 1.73 m  Cardiac Panel (last 3 results) No results for input(s): CKTOTAL, CKMB, TROPONINIHS, RELINDX in the last 72 hours.  IMPRESSION:    ICD-10-CM   1. Chronic HFrEF (heart failure with reduced ejection fraction) (HCC)  I50.22 nitroGLYCERIN (NITROSTAT) 0.4 MG SL tablet    2. Dyspnea on exertion  R06.00 EKG 12-Lead    3. Paroxysmal atrial fibrillation (HCC)  I48.0     4. Long term (current) use of anticoagulants  Z79.01     5. Long term current use of antiarrhythmic drug  Z79.899     6. Essential hypertension  I10     7. Mixed hyperlipidemia  E78.2 Lipid Panel With LDL/HDL Ratio    LDL cholesterol, direct    CMP14+EGFR    8. OSA treated with BiPAP  G47.33     9. Former smoker  Z87.891     26. Class 2 severe obesity due to excess calories with serious comorbidity and body mass index (BMI) of 38.0 to 38.9 in adult (HCC)  E66.01    Z68.38     11. Type 2 diabetes mellitus without complication, with long-term current use of insulin (HCC)  E11.9    Z79.4     12. Long-term insulin use (HCC)  Z79.4     13. History of COVID-19  Z86.16        RECOMMENDATIONS: Joshua Lara is a 75  y.o. male whose past medical history and cardiovascular risk factors include: Paroxysmal atrial fibrillation, oral anticoagulation, long-term antiarrhythmic medication, heart failure with reduced EF, stage C, NYHA class II, hypertension, hyperlipidemia, OSA on CPAP, diabetes mellitus, obesity due to excess calories, advanced age.  Chronic HFrEF, stage C, NYHA class II: Continue current medical therapy. Lost 3 pounds since last office visit.  Recommend daily weight check, strict I/O's Fluid restriction to <2L per day, Na restriction < 1.5g per day We will continue to uptitrate guideline directed medical therapy as hemodynamics and laboratory values allow.  Dyspnea on exertion:  Multifactorial, chronic and stable Patient has undergone ischemic evaluation as noted above. Continue current medical therapy. See above  Precordial pain: Occasional substernal discomfort when he wakes up in the morning.  I suspect that he may have a component of Prinzmetal angina.  He has not required sublingual nitroglycerin tablets with his discomfort and it is usually self-limited and has not seeked medical attention. He recently had both an echocardiogram and stress test. We discussed undergoing additional testing during today's office visit but he would like to hold off unless if symptoms progress. Will refill nitroglycerin tablets.  Mixed hyperlipidemia: Stopped Livalo 3 weeks ago due to myalgia.  Patient states that he has tried Crestor and pravastatin in the past and they both have also caused him myalgias. Check fasting lipid profile and a CMP to reevaluate his lipids Given his statin intolerance may consider nonstatin options given his LDL levels.  Paroxysmal atrial fibrillation: CHA2DS2-VASc SCORE is 5 which correlates to 6.7 % risk of stroke per year. Rate/rhythm control: Sotalol. Thromboembolic prophylaxis: Eliquis   Long-term oral anticoagulation: Indication: Paroxysmal atrial fibrillation. Does  not endorse any evidence of bleeding.  Long-term antiarrhythmic medications: Currently on sotalol for underlying PAF.   FINAL MEDICATION LIST END OF ENCOUNTER: Meds ordered this encounter  Medications   nitroGLYCERIN (NITROSTAT) 0.4 MG SL tablet  Sig: Place 1 tablet (0.4 mg total) under the tongue every 5 (five) minutes as needed for chest pain. If you require more than two tablets five minutes apart go to the nearest ER via EMS.    Dispense:  30 tablet    Refill:  0     Current Outpatient Medications:    BD PEN NEEDLE NANO 2ND GEN 32G X 4 MM MISC, , Disp: , Rfl:    bimatoprost (LUMIGAN) 0.01 % SOLN, Place 1 drop into both eyes at bedtime. , Disp: , Rfl:    ELIQUIS 5 MG TABS tablet, TAKE 1 TABLET BY MOUTH EVERY 12 HOURS, Disp: 60 tablet, Rfl: 3   ENTRESTO 97-103 MG, TAKE 1 TABLET BY MOUTH TWICE A DAY, Disp: 60 tablet, Rfl: 5   folic acid (FOLVITE) 1 MG tablet, Take 1 mg by mouth daily., Disp: , Rfl:    HUMULIN R U-500 KWIKPEN 500 UNIT/ML kwikpen, Inject 125-150 Units into the skin 2 (two) times daily. , Disp: , Rfl:    JARDIANCE 10 MG TABS tablet, Take 10 mg by mouth daily., Disp: , Rfl:    levothyroxine (SYNTHROID, LEVOTHROID) 100 MCG tablet, Take 100 mcg by mouth daily., Disp: , Rfl:    methotrexate 2.5 MG tablet, Take 5 mg by mouth once a week. $Remov'5mg'UuQVIy$  in Morning, $RemoveBef'5mg'qJtrVtPKgn$  at night, Disp: , Rfl:    nitroGLYCERIN (NITROSTAT) 0.4 MG SL tablet, Place 1 tablet (0.4 mg total) under the tongue every 5 (five) minutes as needed for chest pain. If you require more than two tablets five minutes apart go to the nearest ER via EMS., Disp: 30 tablet, Rfl: 0   pantoprazole (PROTONIX) 40 MG tablet, TAKE 1 TABLET BY MOUTH TWICE A DAY, Disp: 180 tablet, Rfl: 0   sildenafil (VIAGRA) 100 MG tablet, Take 100 mg by mouth every 4 (four) hours as needed., Disp: , Rfl:    sotalol (BETAPACE) 80 MG tablet, TAKE 0.5 TABLETS BY MOUTH EVERY MORNING AND TAKE 1 TABLET IN THE EVENING, Disp: 120 tablet, Rfl: 0   TRULICITY  9.89 QJ/1.9ER SOPN, INJECT 0.75 MG ONCE A WEEK SUBCUTANEOUS 30 DAYS, Disp: , Rfl:   Orders Placed This Encounter  Procedures   Lipid Panel With LDL/HDL Ratio   LDL cholesterol, direct   CMP14+EGFR   EKG 12-Lead   --Continue cardiac medications as reconciled in final medication list. --Return in about 6 months (around 05/26/2021) for Follow up, heart failure management., A. fib. Or sooner if needed. --Continue follow-up with your primary care physician regarding the management of your other chronic comorbid conditions.  Patient's questions and concerns were addressed to his satisfaction. He voices understanding of the instructions provided during this encounter.   This note was created using a voice recognition software as a result there may be grammatical errors inadvertently enclosed that do not reflect the nature of this encounter. Every attempt is made to correct such errors.  Rex Kras, [place], [facility]  Pager: [phone] Office: [phone]

## 2020-11-26 DIAGNOSIS — E782 Mixed hyperlipidemia: Secondary | ICD-10-CM | POA: Diagnosis not present

## 2020-11-29 DIAGNOSIS — Z6839 Body mass index (BMI) 39.0-39.9, adult: Secondary | ICD-10-CM | POA: Diagnosis not present

## 2020-11-29 DIAGNOSIS — R059 Cough, unspecified: Secondary | ICD-10-CM | POA: Diagnosis not present

## 2020-11-29 DIAGNOSIS — I5030 Unspecified diastolic (congestive) heart failure: Secondary | ICD-10-CM | POA: Diagnosis not present

## 2020-11-29 DIAGNOSIS — U071 COVID-19: Secondary | ICD-10-CM | POA: Diagnosis not present

## 2020-11-29 DIAGNOSIS — Z794 Long term (current) use of insulin: Secondary | ICD-10-CM | POA: Diagnosis not present

## 2020-11-29 DIAGNOSIS — E1140 Type 2 diabetes mellitus with diabetic neuropathy, unspecified: Secondary | ICD-10-CM | POA: Diagnosis not present

## 2020-12-06 DIAGNOSIS — Z20822 Contact with and (suspected) exposure to covid-19: Secondary | ICD-10-CM | POA: Diagnosis not present

## 2020-12-06 DIAGNOSIS — K219 Gastro-esophageal reflux disease without esophagitis: Secondary | ICD-10-CM | POA: Diagnosis not present

## 2020-12-06 DIAGNOSIS — Z6839 Body mass index (BMI) 39.0-39.9, adult: Secondary | ICD-10-CM | POA: Diagnosis not present

## 2020-12-06 DIAGNOSIS — I5030 Unspecified diastolic (congestive) heart failure: Secondary | ICD-10-CM | POA: Diagnosis not present

## 2020-12-06 DIAGNOSIS — R059 Cough, unspecified: Secondary | ICD-10-CM | POA: Diagnosis not present

## 2020-12-07 DIAGNOSIS — G4733 Obstructive sleep apnea (adult) (pediatric): Secondary | ICD-10-CM | POA: Diagnosis not present

## 2020-12-07 NOTE — Progress Notes (Signed)
External Labs: Collected: 11/26/2020 Sodium 143, potassium 3.7, chloride 106, bicarb 24, BUN 8, creatinine 0.94 mg/dL. AST 20, ALT 23, alkaline phosphatase 114 (all within normal limits) Total cholesterol 175, triglycerides 216, HDL 45, LDL 86, non-HDL 93

## 2020-12-14 DIAGNOSIS — R059 Cough, unspecified: Secondary | ICD-10-CM | POA: Diagnosis not present

## 2020-12-14 DIAGNOSIS — Z79899 Other long term (current) drug therapy: Secondary | ICD-10-CM | POA: Diagnosis not present

## 2020-12-14 DIAGNOSIS — M069 Rheumatoid arthritis, unspecified: Secondary | ICD-10-CM | POA: Diagnosis not present

## 2020-12-14 DIAGNOSIS — E662 Morbid (severe) obesity with alveolar hypoventilation: Secondary | ICD-10-CM | POA: Diagnosis not present

## 2020-12-14 DIAGNOSIS — I2510 Atherosclerotic heart disease of native coronary artery without angina pectoris: Secondary | ICD-10-CM | POA: Diagnosis not present

## 2020-12-14 DIAGNOSIS — R5383 Other fatigue: Secondary | ICD-10-CM | POA: Diagnosis not present

## 2020-12-14 DIAGNOSIS — K219 Gastro-esophageal reflux disease without esophagitis: Secondary | ICD-10-CM | POA: Diagnosis not present

## 2020-12-14 DIAGNOSIS — I5030 Unspecified diastolic (congestive) heart failure: Secondary | ICD-10-CM | POA: Diagnosis not present

## 2020-12-14 DIAGNOSIS — E118 Type 2 diabetes mellitus with unspecified complications: Secondary | ICD-10-CM | POA: Diagnosis not present

## 2020-12-14 DIAGNOSIS — U071 COVID-19: Secondary | ICD-10-CM | POA: Diagnosis not present

## 2020-12-14 DIAGNOSIS — I10 Essential (primary) hypertension: Secondary | ICD-10-CM | POA: Diagnosis not present

## 2020-12-14 DIAGNOSIS — G4733 Obstructive sleep apnea (adult) (pediatric): Secondary | ICD-10-CM | POA: Diagnosis not present

## 2020-12-14 DIAGNOSIS — E039 Hypothyroidism, unspecified: Secondary | ICD-10-CM | POA: Diagnosis not present

## 2020-12-14 DIAGNOSIS — I480 Paroxysmal atrial fibrillation: Secondary | ICD-10-CM | POA: Diagnosis not present

## 2020-12-19 NOTE — Progress Notes (Signed)
Tried calling patient no answer left a vm

## 2020-12-21 DIAGNOSIS — H401112 Primary open-angle glaucoma, right eye, moderate stage: Secondary | ICD-10-CM | POA: Diagnosis not present

## 2020-12-21 DIAGNOSIS — H401123 Primary open-angle glaucoma, left eye, severe stage: Secondary | ICD-10-CM | POA: Diagnosis not present

## 2020-12-31 NOTE — Progress Notes (Signed)
No answer left a vm

## 2021-01-02 NOTE — Progress Notes (Signed)
Patient called back he is willing to try a nonstatin medication best number to contact pt is [phone] after 1 pm

## 2021-01-04 DIAGNOSIS — G4733 Obstructive sleep apnea (adult) (pediatric): Secondary | ICD-10-CM | POA: Diagnosis not present

## 2021-01-14 ENCOUNTER — Other Ambulatory Visit: Payer: Self-pay | Admitting: Cardiology

## 2021-01-14 DIAGNOSIS — I5033 Acute on chronic diastolic (congestive) heart failure: Secondary | ICD-10-CM

## 2021-01-14 DIAGNOSIS — I480 Paroxysmal atrial fibrillation: Secondary | ICD-10-CM

## 2021-01-15 NOTE — Telephone Encounter (Signed)
Can I refill sotalol

## 2021-01-18 DIAGNOSIS — Z794 Long term (current) use of insulin: Secondary | ICD-10-CM | POA: Diagnosis not present

## 2021-01-18 DIAGNOSIS — H409 Unspecified glaucoma: Secondary | ICD-10-CM | POA: Diagnosis not present

## 2021-01-18 DIAGNOSIS — E1136 Type 2 diabetes mellitus with diabetic cataract: Secondary | ICD-10-CM | POA: Diagnosis not present

## 2021-01-18 DIAGNOSIS — E1142 Type 2 diabetes mellitus with diabetic polyneuropathy: Secondary | ICD-10-CM | POA: Diagnosis not present

## 2021-01-20 ENCOUNTER — Other Ambulatory Visit: Payer: Self-pay | Admitting: Cardiology

## 2021-01-20 DIAGNOSIS — I480 Paroxysmal atrial fibrillation: Secondary | ICD-10-CM

## 2021-01-21 NOTE — Telephone Encounter (Signed)
Can I send 90 days?

## 2021-01-25 NOTE — Telephone Encounter (Signed)
Yes refill it.

## 2021-02-22 DIAGNOSIS — M1A09X0 Idiopathic chronic gout, multiple sites, without tophus (tophi): Secondary | ICD-10-CM | POA: Diagnosis not present

## 2021-02-22 DIAGNOSIS — Z6838 Body mass index (BMI) 38.0-38.9, adult: Secondary | ICD-10-CM | POA: Diagnosis not present

## 2021-02-22 DIAGNOSIS — M0609 Rheumatoid arthritis without rheumatoid factor, multiple sites: Secondary | ICD-10-CM | POA: Diagnosis not present

## 2021-02-22 DIAGNOSIS — E669 Obesity, unspecified: Secondary | ICD-10-CM | POA: Diagnosis not present

## 2021-02-22 DIAGNOSIS — M5136 Other intervertebral disc degeneration, lumbar region: Secondary | ICD-10-CM | POA: Diagnosis not present

## 2021-02-22 DIAGNOSIS — R053 Chronic cough: Secondary | ICD-10-CM | POA: Diagnosis not present

## 2021-02-26 DIAGNOSIS — G4733 Obstructive sleep apnea (adult) (pediatric): Secondary | ICD-10-CM | POA: Diagnosis not present

## 2021-03-01 DIAGNOSIS — E1151 Type 2 diabetes mellitus with diabetic peripheral angiopathy without gangrene: Secondary | ICD-10-CM | POA: Diagnosis not present

## 2021-03-01 DIAGNOSIS — D6869 Other thrombophilia: Secondary | ICD-10-CM | POA: Diagnosis not present

## 2021-03-01 DIAGNOSIS — Z7982 Long term (current) use of aspirin: Secondary | ICD-10-CM | POA: Diagnosis not present

## 2021-03-01 DIAGNOSIS — Z6836 Body mass index (BMI) 36.0-36.9, adult: Secondary | ICD-10-CM | POA: Diagnosis not present

## 2021-03-01 DIAGNOSIS — G4733 Obstructive sleep apnea (adult) (pediatric): Secondary | ICD-10-CM | POA: Diagnosis not present

## 2021-03-01 DIAGNOSIS — J449 Chronic obstructive pulmonary disease, unspecified: Secondary | ICD-10-CM | POA: Diagnosis not present

## 2021-03-01 DIAGNOSIS — Z7984 Long term (current) use of oral hypoglycemic drugs: Secondary | ICD-10-CM | POA: Diagnosis not present

## 2021-03-01 DIAGNOSIS — I10 Essential (primary) hypertension: Secondary | ICD-10-CM | POA: Diagnosis not present

## 2021-03-01 DIAGNOSIS — I4891 Unspecified atrial fibrillation: Secondary | ICD-10-CM | POA: Diagnosis not present

## 2021-03-01 DIAGNOSIS — Z7901 Long term (current) use of anticoagulants: Secondary | ICD-10-CM | POA: Diagnosis not present

## 2021-03-01 DIAGNOSIS — I2510 Atherosclerotic heart disease of native coronary artery without angina pectoris: Secondary | ICD-10-CM | POA: Diagnosis not present

## 2021-03-13 DIAGNOSIS — M0609 Rheumatoid arthritis without rheumatoid factor, multiple sites: Secondary | ICD-10-CM | POA: Diagnosis not present

## 2021-03-29 DIAGNOSIS — R809 Proteinuria, unspecified: Secondary | ICD-10-CM | POA: Diagnosis not present

## 2021-03-29 DIAGNOSIS — E1129 Type 2 diabetes mellitus with other diabetic kidney complication: Secondary | ICD-10-CM | POA: Diagnosis not present

## 2021-03-29 DIAGNOSIS — K115 Sialolithiasis: Secondary | ICD-10-CM | POA: Diagnosis not present

## 2021-03-29 DIAGNOSIS — Z794 Long term (current) use of insulin: Secondary | ICD-10-CM | POA: Diagnosis not present

## 2021-04-12 DIAGNOSIS — R5383 Other fatigue: Secondary | ICD-10-CM | POA: Diagnosis not present

## 2021-04-12 DIAGNOSIS — G4733 Obstructive sleep apnea (adult) (pediatric): Secondary | ICD-10-CM | POA: Diagnosis not present

## 2021-04-12 DIAGNOSIS — R059 Cough, unspecified: Secondary | ICD-10-CM | POA: Diagnosis not present

## 2021-04-12 DIAGNOSIS — E662 Morbid (severe) obesity with alveolar hypoventilation: Secondary | ICD-10-CM | POA: Diagnosis not present

## 2021-04-26 DIAGNOSIS — H401112 Primary open-angle glaucoma, right eye, moderate stage: Secondary | ICD-10-CM | POA: Diagnosis not present

## 2021-04-26 DIAGNOSIS — H401123 Primary open-angle glaucoma, left eye, severe stage: Secondary | ICD-10-CM | POA: Diagnosis not present

## 2021-05-17 DIAGNOSIS — J019 Acute sinusitis, unspecified: Secondary | ICD-10-CM | POA: Diagnosis not present

## 2021-05-17 DIAGNOSIS — R6889 Other general symptoms and signs: Secondary | ICD-10-CM | POA: Diagnosis not present

## 2021-05-17 DIAGNOSIS — Z20822 Contact with and (suspected) exposure to covid-19: Secondary | ICD-10-CM | POA: Diagnosis not present

## 2021-05-17 DIAGNOSIS — Z6837 Body mass index (BMI) 37.0-37.9, adult: Secondary | ICD-10-CM | POA: Diagnosis not present

## 2021-05-24 DIAGNOSIS — I5030 Unspecified diastolic (congestive) heart failure: Secondary | ICD-10-CM | POA: Diagnosis not present

## 2021-05-24 DIAGNOSIS — D6869 Other thrombophilia: Secondary | ICD-10-CM | POA: Diagnosis not present

## 2021-05-24 DIAGNOSIS — I25118 Atherosclerotic heart disease of native coronary artery with other forms of angina pectoris: Secondary | ICD-10-CM | POA: Diagnosis not present

## 2021-05-24 DIAGNOSIS — I4891 Unspecified atrial fibrillation: Secondary | ICD-10-CM | POA: Diagnosis not present

## 2021-05-24 DIAGNOSIS — D692 Other nonthrombocytopenic purpura: Secondary | ICD-10-CM | POA: Diagnosis not present

## 2021-05-24 DIAGNOSIS — M0600 Rheumatoid arthritis without rheumatoid factor, unspecified site: Secondary | ICD-10-CM | POA: Diagnosis not present

## 2021-05-24 DIAGNOSIS — E1151 Type 2 diabetes mellitus with diabetic peripheral angiopathy without gangrene: Secondary | ICD-10-CM | POA: Diagnosis not present

## 2021-05-24 DIAGNOSIS — G4733 Obstructive sleep apnea (adult) (pediatric): Secondary | ICD-10-CM | POA: Diagnosis not present

## 2021-05-24 DIAGNOSIS — F330 Major depressive disorder, recurrent, mild: Secondary | ICD-10-CM | POA: Diagnosis not present

## 2021-05-24 DIAGNOSIS — E785 Hyperlipidemia, unspecified: Secondary | ICD-10-CM | POA: Diagnosis not present

## 2021-05-24 DIAGNOSIS — Z794 Long term (current) use of insulin: Secondary | ICD-10-CM | POA: Diagnosis not present

## 2021-05-24 DIAGNOSIS — I110 Hypertensive heart disease with heart failure: Secondary | ICD-10-CM | POA: Diagnosis not present

## 2021-05-29 ENCOUNTER — Other Ambulatory Visit: Payer: Self-pay | Admitting: Cardiology

## 2021-05-29 DIAGNOSIS — I5033 Acute on chronic diastolic (congestive) heart failure: Secondary | ICD-10-CM

## 2021-05-29 DIAGNOSIS — I480 Paroxysmal atrial fibrillation: Secondary | ICD-10-CM

## 2021-05-30 DIAGNOSIS — Z79899 Other long term (current) drug therapy: Secondary | ICD-10-CM | POA: Diagnosis not present

## 2021-05-31 ENCOUNTER — Ambulatory Visit: Payer: PPO | Admitting: Cardiology

## 2021-05-31 ENCOUNTER — Encounter: Payer: Self-pay | Admitting: Cardiology

## 2021-05-31 ENCOUNTER — Other Ambulatory Visit: Payer: Self-pay

## 2021-05-31 VITALS — BP 108/69 | HR 64 | Temp 98.4°F | Resp 16 | Ht 70.0 in | Wt 257.0 lb

## 2021-05-31 DIAGNOSIS — I5022 Chronic systolic (congestive) heart failure: Secondary | ICD-10-CM | POA: Diagnosis not present

## 2021-05-31 DIAGNOSIS — E7800 Pure hypercholesterolemia, unspecified: Secondary | ICD-10-CM | POA: Diagnosis not present

## 2021-05-31 DIAGNOSIS — Z87891 Personal history of nicotine dependence: Secondary | ICD-10-CM

## 2021-05-31 DIAGNOSIS — E119 Type 2 diabetes mellitus without complications: Secondary | ICD-10-CM | POA: Diagnosis not present

## 2021-05-31 DIAGNOSIS — G4733 Obstructive sleep apnea (adult) (pediatric): Secondary | ICD-10-CM | POA: Diagnosis not present

## 2021-05-31 DIAGNOSIS — Z789 Other specified health status: Secondary | ICD-10-CM | POA: Diagnosis not present

## 2021-05-31 DIAGNOSIS — Z79899 Other long term (current) drug therapy: Secondary | ICD-10-CM | POA: Diagnosis not present

## 2021-05-31 DIAGNOSIS — Z7901 Long term (current) use of anticoagulants: Secondary | ICD-10-CM

## 2021-05-31 DIAGNOSIS — H401123 Primary open-angle glaucoma, left eye, severe stage: Secondary | ICD-10-CM | POA: Diagnosis not present

## 2021-05-31 DIAGNOSIS — I10 Essential (primary) hypertension: Secondary | ICD-10-CM

## 2021-05-31 DIAGNOSIS — I480 Paroxysmal atrial fibrillation: Secondary | ICD-10-CM

## 2021-05-31 DIAGNOSIS — E781 Pure hyperglyceridemia: Secondary | ICD-10-CM

## 2021-05-31 DIAGNOSIS — Z794 Long term (current) use of insulin: Secondary | ICD-10-CM | POA: Diagnosis not present

## 2021-05-31 DIAGNOSIS — H401112 Primary open-angle glaucoma, right eye, moderate stage: Secondary | ICD-10-CM | POA: Diagnosis not present

## 2021-05-31 MED ORDER — FENOFIBRATE 145 MG PO TABS: 145.0000 mg | ORAL_TABLET | Freq: Every day | ORAL | 0 refills | Status: DC

## 2021-05-31 MED ORDER — REPATHA SURECLICK 140 MG/ML ~~LOC~~ SOAJ: 140.0000 mg | SUBCUTANEOUS | 3 refills | Status: DC

## 2021-05-31 MED ORDER — ENTRESTO 49-51 MG PO TABS: 1.0000 | ORAL_TABLET | Freq: Two times a day (BID) | ORAL | 2 refills | Status: DC

## 2021-05-31 NOTE — Progress Notes (Signed)
Joshua Lara Date of Birth: 26-Nov-1945 MRN: [id number] Primary Care Provider:[name], [name] Former Cardiology Providers: Jeri Lager, APRN, FNP-C Primary Cardiologist: Rex Kras, DO, [facility] (established care 10/28/2019)  Date: 05/31/21 Last Office Visit: 11/23/2020  Chief Complaint  Patient presents with   Atrial Fibrillation   Follow-up   Congestive Heart Failure    HPI  Joshua Lara is a 76 y.o.  male who presents to the office with a chief complaint of "70-month follow-up for afib and heart failure management." Patient's past medical history and cardiovascular risk factors include: Paroxysmal atrial fibrillation, oral anticoagulation, long-term antiarrhythmic medication, heart failure with reduced EF, stage C, NYHA class II, hypertension, hyperlipidemia, OSA on CPAP, diabetes mellitus, glaucoma, obesity due to excess calories, advanced age.  Patient presents today for 98-month follow-up.  For management of atrial fibrillation (paroxysmal), HFrEF, and hyperlipidemia.  From atrial fibrillation standpoint patient remained stable since last office visit.  He denies any evidence of bleeding.  He is tolerating sotalol well without any side effects or intolerances.  EKG today shows normal sinus rhythm.  With regards to HFrEF patient has not been hospitalized since last office visit for cardiovascular symptoms.  He has lost additional 10 pounds due to dietary changes.  He is compliant with his medications.  He voices that his systolic blood pressures have been trending up between 90-100 mmHg.  Otherwise denies orthopnea, paroxysmal nocturnal dyspnea or lower extremity swelling.  Most recent lipid profile reviewed with the patient.  He has stopped Livalo due to myalgias and has been intolerant to statin therapy.  In addition, his triglycerides are not well controlled either.  We discussed pharmacological therapy and patient is agreeable.  ALLERGIES: Allergies  Allergen Reactions    Bactrim [Sulfamethoxazole-Trimethoprim] Swelling   Zantac [Ranitidine Hcl] Anaphylaxis    Has anaphylactic shock--intubated and in ICU x 4 days    Etanercept Other (See Comments) and Cough    (ENBREL); Patient thinks it contributed to heart issues   Etanercept     Other reaction(s): Cough (ALLERGY/intolerance), Other (See Comments) (ENBREL); Patient thinks it contributed to heart issues   Sulfa Antibiotics Other (See Comments)   Tape Itching    Can only tolerate bandages for less than 24-hr periods Can only tolerate bandages for less than 24-hr periods     MEDICATION LIST PRIOR TO VISIT: Current Outpatient Medications on File Prior to Visit  Medication Sig Dispense Refill   acetaminophen (TYLENOL) 650 MG CR tablet Take 650 mg by mouth every 8 (eight) hours as needed for pain.     albuterol (VENTOLIN HFA) 108 (90 Base) MCG/ACT inhaler Inhale into the lungs every 6 (six) hours as needed for wheezing or shortness of breath.     amoxicillin-clavulanate (AUGMENTIN) 875-125 MG tablet Take 1 tablet by mouth 2 (two) times daily.     BD PEN NEEDLE NANO 2ND GEN 32G X 4 MM MISC      dorzolamide-timolol (COSOPT) 22.3-6.8 MG/ML ophthalmic solution 1 drop 2 (two) times daily.     ELIQUIS 5 MG TABS tablet TAKE 1 TABLET BY MOUTH EVERY 12 HOURS 60 tablet 5   fluticasone (FLONASE) 50 MCG/ACT nasal spray Place into both nostrils daily.     folic acid (FOLVITE) 1 MG tablet Take 1 mg by mouth daily.     HUMULIN R U-500 KWIKPEN 500 UNIT/ML kwikpen Inject 125-150 Units into the skin 2 (two) times daily.      JARDIANCE 10 MG TABS tablet Take 10 mg by mouth  daily.     latanoprost (XALATAN) 0.005 % ophthalmic solution 1 drop at bedtime.     levothyroxine (SYNTHROID, LEVOTHROID) 100 MCG tablet Take 100 mcg by mouth daily.     methotrexate 2.5 MG tablet Take 5 mg by mouth once a week. $Remov'5mg'uBtgHu$  in Morning, $RemoveBef'5mg'bhOwhRCNMn$  at night     pantoprazole (PROTONIX) 40 MG tablet TAKE 1 TABLET BY MOUTH TWICE A DAY 180 tablet 0    sildenafil (VIAGRA) 100 MG tablet Take 100 mg by mouth every 4 (four) hours as needed.     sotalol (BETAPACE) 80 MG tablet TAKE 0.5 TABLETS BY MOUTH EVERY MORNING AND TAKE 1 TABLET IN THE EVENING 681 tablet 2   TRULICITY 2.75 TZ/0.0FV SOPN INJECT 0.75 MG ONCE A WEEK SUBCUTANEOUS 30 DAYS     nitroGLYCERIN (NITROSTAT) 0.4 MG SL tablet Place 1 tablet (0.4 mg total) under the tongue every 5 (five) minutes as needed for chest pain. If you require more than two tablets five minutes apart go to the nearest ER via EMS. 30 tablet 0   No current facility-administered medications on file prior to visit.    PAST MEDICAL HISTORY: Past Medical History:  Diagnosis Date   Arthritis    "hands, back, ankles" (05/11/2014)   CHF (congestive heart failure) (HCC)    Chronic lower back pain    Compression fracture of lumbar vertebra (HCC)    Coronary artery disease    Daily headache    "recently" (05/11/2014)   Depression    "I might be slight" (05/11/2014)   GERD (gastroesophageal reflux disease)    High cholesterol    Hypertension    Hypothyroid    OSA treated with BiPAP    Pneumonia    "couple times; last time was 03/2011" (05/11/2014)   Type II diabetes mellitus ([place])     PAST SURGICAL HISTORY: Past Surgical History:  Procedure Laterality Date   APPENDECTOMY     CARDIAC CATHETERIZATION  2001; 2009   CATARACT EXTRACTION W/ INTRAOCULAR LENS IMPLANT Left    CHOLECYSTECTOMY  1980's   Archie Endo 07/13/2010    EYE MUSCLE SURGERY Left    LEFT AND RIGHT HEART CATHETERIZATION WITH CORONARY ANGIOGRAM N/A 05/30/2014   Procedure: LEFT AND RIGHT HEART CATHETERIZATION WITH CORONARY ANGIOGRAM;  Surgeon: Jolaine Artist, MD;  Location: [facility] CATH LAB;  Service: Cardiovascular;  Laterality: N/A;   LEFT HEART CATH AND CORONARY ANGIOGRAPHY N/A 12/20/2018   Procedure: LEFT HEART CATH AND CORONARY ANGIOGRAPHY and possible intervention;  Surgeon: Adrian Prows, MD;  Location: [facility] CV LAB;  Service: Cardiovascular;   Laterality: N/A;   MASTOIDECTOMY Right 1970's   Archie Endo 07/13/2010   SHOULDER OPEN ROTATOR CUFF REPAIR Left 07/2010   SPHINCTEROTOMY     TONSILLECTOMY AND ADENOIDECTOMY      FAMILY HISTORY: The patient's family history includes Heart attack in his father; Kidney failure in his mother.   SOCIAL HISTORY:  The patient  reports that he quit smoking about 31 years ago. His smoking use included cigarettes. He has a 60.00 pack-year smoking history. He has never used smokeless tobacco. He reports current alcohol use. He reports that he does not use drugs.  Review of Systems  Constitutional: Positive for malaise/fatigue. Negative for chills and fever.  HENT:  Negative for hoarse voice and nosebleeds.   Eyes:  Negative for discharge, double vision and pain.  Cardiovascular:  Positive for dyspnea on exertion (improving). Negative for chest pain, claudication, leg swelling, near-syncope, orthopnea, palpitations, paroxysmal nocturnal dyspnea and syncope.  Respiratory:  Negative for hemoptysis and shortness of breath.   Musculoskeletal:  Negative for muscle cramps and myalgias.  Gastrointestinal:  Negative for abdominal pain, constipation, diarrhea, hematemesis, hematochezia, melena, nausea and vomiting.  Neurological:  Negative for dizziness, light-headedness and vertigo.   PHYSICAL EXAM: Vitals with BMI 05/31/2021 11/23/2020 04/27/2020  Height $Remov'5\' 10"'dvPOOy$  $RemoveB'5\' 10"'OQtuCKaN$  $RemoveBef'5\' 10"'kdJxoXAvxz$   Weight 257 lbs 267 lbs 270 lbs  BMI 36.88 10.07 12.19  Systolic 758 832 549  Diastolic 69 76 69  Pulse 64 82 95   CONSTITUTIONAL: Caucasian gentleman, hemodynamically stable, no acute distress. SKIN: Skin is warm and dry. No rash noted. No cyanosis. No pallor. No jaundice HEAD: Normocephalic and atraumatic.  EYES: No scleral icterus MOUTH/THROAT: Moist oral membranes.  NECK: No JVD present. No thyromegaly noted. No carotid bruits  LYMPHATIC: No visible cervical adenopathy. CHEST Normal respiratory effort. No intercostal retractions   LUNGS: Clear to auscultation bilaterally. No stridor. No wheezes. No rales.  CARDIOVASCULAR: Regular rate and rhythm, positive S1-S2, no murmurs rubs or gallops appreciated. ABDOMINAL: Obese, soft, nontender, nondistended, positive bowel sounds all 4 quadrants. No apparent ascites.  EXTREMITIES: No peripheral edema, warm to touch, 2+ DP and PT pulses. HEMATOLOGIC: No significant bruising NEUROLOGIC: Oriented to person, place, and time. Nonfocal. Normal muscle tone.  PSYCHIATRIC: Normal mood and affect. Normal behavior. Cooperative  CARDIAC DATABASE: EKG:  05/31/2021: NSR, 87 bpm, first-degree AV block, consider old anterior infarct, nonspecific T wave abnormality, QT 422msec.  Echocardiogram: 12/09/2019: Mildly depressed LV systolic function with visual EF 45-50%. Left ventricle cavity is normal in size. Mild left ventricular hypertrophy. Normal global wall motion. Indeterminate diastolic filling pattern, elevated LAP.  Mild tricuspid regurgitation. No evidence of pulmonary hypertension. No other significant valvular abnormalities. Compared to previous study 12/19/2018 no significant change.   Stress Testing:  Lexiscan/modified Bruce Tetrofosmin stress test 11/23/2019: Lexiscan/modified Bruce nuclear stress test performed using 1-day protocol. Stress EKG is non-diagnostic, as this is pharmacological stress test. In addition, stress EKG at 71% MPHR showed sinus rhythm, possible old anteroseptal infarct, poor R wave progression, low voltage, no ischemic changes.   Large areas of soft tissue attenuation seen, particularly in vertical long axis images. In addition, Apical thinning seen on both rest and stress images may be physiological. Overall, no reversible ischemia noted. Stress LVEF 49%, although visually appears normal.  Low risk study.   Heart Catheterization: 12/20/2018: Left ventricle is borderline dilated.  Mild decrease in LV systolic function with global hypokinesis, EF 45%.  Normal  LVEDP. Minimal luminal irregularity, no coronary artery disease.  Right dominant circulation.   LABORATORY DATA: CBC Latest Ref Rng & Units 11/23/2019 12/16/2018 11/05/2016  WBC 4.0 - 10.5 K/uL 5.5 8.2 10.6(H)  Hemoglobin 13.0 - 17.0 g/dL 14.9 14.1 11.8(L)  Hematocrit 39.0 - 52.0 % 44.0 40.4 36.0(L)  Platelets 150 - 400 K/uL 167 194 224    CMP Latest Ref Rng & Units 01/30/2020 11/23/2019 12/19/2018  Glucose 65 - 99 mg/dL 208(H) 222(H) 144(H)  BUN 8 - 27 mg/dL $Remove'9 16 20  'bMVdZmC$ Creatinine 0.76 - 1.27 mg/dL 0.86 0.94 1.01  Sodium 134 - 144 mmol/L 140 136 136  Potassium 3.5 - 5.2 mmol/L 4.1 4.1 3.8  Chloride 96 - 106 mmol/L 103 102 98  CO2 20 - 29 mmol/L $RemoveB'24 23 26  'jbkjMdXf$ Calcium 8.6 - 10.2 mg/dL 9.9 9.7 9.9  Total Protein 6.5 - 8.1 g/dL - 7.2 -  Total Bilirubin 0.3 - 1.2 mg/dL - 0.9 -  Alkaline Phos 38 - 126  U/L - 99 -  AST 15 - 41 U/L - 41 -  ALT 0 - 44 U/L - 46(H) -    Lipid Panel     Component Value Date/Time   CHOL 142 12/17/2018 0420   TRIG 109 12/17/2018 0420   HDL 59 12/17/2018 0420   CHOLHDL 2.4 12/17/2018 0420   VLDL 22 12/17/2018 0420   LDLCALC 61 12/17/2018 0420    Lab Results  Component Value Date   HGBA1C 6.4 (H) 12/17/2018   HGBA1C 6.2 (H) 05/11/2014   HGBA1C 7.3 (H) 04/05/2011   No components found for: NTPROBNP Lab Results  Component Value Date   TSH 3.819 12/17/2018   TSH 1.309 05/11/2014   TSH 1.580 04/05/2011   External Labs: Collected: 10/31/2019 Creatinine 0.84 mg/dL. eGFR: 87 mL/min per 1.73 m Lipid profile: Total cholesterol 138, triglycerides 265, HDL 37, LDL 59 ProBNP: 9.5  External Labs: Collected: 01/02/2020 BNP 18.8 Magnesium 2.2 Potassium: 4 AST/ALT: 20/24 Creatinine 0.88 mg/dL. eGFR: 99 mL/min per 1.73 m  External Labs: Collected: 11/26/2020 Sodium 143, potassium 3.7, chloride 106, bicarb 24, BUN 8, creatinine 0.94 mg/dL. AST 20, ALT 23, alkaline phosphatase 114 (all within normal limits) Total cholesterol 175, triglycerides 216, HDL 45, LDL  86, non-HDL 93  Cardiac Panel (last 3 results) No results for input(s): CKTOTAL, CKMB, TROPONINIHS, RELINDX in the last 72 hours.  IMPRESSION:    ICD-10-CM   1. Chronic HFrEF (heart failure with reduced ejection fraction) (HCC)  I50.22 EKG 12-Lead    Evolocumab (REPATHA SURECLICK) 237 MG/ML SOAJ    Pro b natriuretic peptide (BNP)    sacubitril-valsartan (ENTRESTO) 49-51 MG    2. Paroxysmal atrial fibrillation (HCC)  I48.0     3. Long term (current) use of anticoagulants  Z79.01 Hemoglobin and hematocrit, blood    4. Long term current use of antiarrhythmic drug  Z79.899     5. Type 2 diabetes mellitus without complication, with long-term current use of insulin (HCC)  E11.9 Evolocumab (REPATHA SURECLICK) 628 MG/ML SOAJ   Z79.4     6. Essential hypertension  I10     7. Pure hypercholesterolemia  E78.00 Evolocumab (REPATHA SURECLICK) 315 MG/ML SOAJ    CMP14+EGFR    Lipid Panel With LDL/HDL Ratio    LDL cholesterol, direct    8. Hypertriglyceridemia  E78.1 fenofibrate (TRICOR) 145 MG tablet    CMP14+EGFR    Lipid Panel With LDL/HDL Ratio    LDL cholesterol, direct    9. Statin intolerance  Z78.9 Evolocumab (REPATHA SURECLICK) 176 MG/ML SOAJ    CMP14+EGFR    Lipid Panel With LDL/HDL Ratio    LDL cholesterol, direct    10. OSA treated with BiPAP  G47.33     11. Former smoker  Z87.891     42. Class 2 severe obesity due to excess calories with serious comorbidity and body mass index (BMI) of 36.0 to 36.9 in adult [facility])  E66.01    Z68.36        RECOMMENDATIONS: Joshua Lara is a 76 y.o. male whose past medical history and cardiovascular risk factors include: Paroxysmal atrial fibrillation, oral anticoagulation, long-term antiarrhythmic medication, heart failure with reduced EF, stage C, NYHA class II, hypertension, hyperlipidemia, OSA on CPAP, diabetes mellitus, obesity due to excess calories, advanced age.  Chronic HFrEF (heart failure with reduced ejection fraction)  (HCC) Stage C NYHA class II Euvolemic No recent hospitalizations for congestive heart failure. Has lost an additional 10 pounds since last office visit due to  lifestyle changes. Medications reconciled. Given the soft blood pressures and experiencing at home will reduce Entresto to 49/51 mg p.o. twice daily Check NT proBNP. Recommend daily weight check, strict I/O's Fluid restriction to <2L per day, Na restriction < 1.5g per day  Paroxysmal atrial fibrillation (HCC) Rate control: Sotalol. Rhythm control: Sotalol. Thromboembolic prophylaxis: Eliquis. CHA2DS2-VASc SCORE is 5 which correlates to 6.7 % risk of stroke per year.  Long term (current) use of anticoagulants Indication: Paroxysmal atrial fibrillation. Does not endorse evidence of bleeding. Check hemoglobin and hematocrit, and electrolytes  Long term current use of antiarrhythmic drug Indication: Paroxysmal atrial fibrillation. EKG shows normal sinus rhythm. Check renal function and electrolytes.  Type 2 diabetes mellitus without complication, with long-term current use of insulin (HCC) Currently on insulin. With complications of congestive heart failure, glaucoma Continue Entresto, Jardiance. Currently not on statin therapy due to intolerances to multiple statin drugs. See below Educated on importance of glycemic control.  Essential hypertension Soft blood pressures at home. Reduce Entresto as discussed above. Patient is asked to keep a log of his blood pressures, and daily weights, and reducing salt intake  Pure hypercholesterolemia Has been intolerant to statin therapy due to myalgias. Last statin tried Livalo with similar side effect profile. Given the fact that he is diabetic, heart failure, his LDL levels are not well controlled. We will start the patient on Repatha.  Once the prescription is dispensed he is asked to come in for a nurse visit for education. Will check baseline lipids and will need a repeat  lipid profile in 6 weeks after initiation of Repatha.  Hypertriglyceridemia Patient's triglyceride level remains relatively elevated over multiple checks. Patient is willing to start pharmacological therapy. Will start fenofibrate 145 mg p.o. daily  Statin intolerance As discussed below  OSA treated with BiPAP Educated importance of compliance with BiPAP.  Class 2 severe obesity due to excess calories with serious comorbidity and body mass index (BMI) of 36.0 to 36.9 in adult [facility]) Body mass index is 36.88 kg/m. Has lost additional 10 pounds since last office visit which he is congratulated for at today's office visit I reviewed with the patient the importance of diet, regular physical activity/exercise, weight loss.   Patient is educated on increasing physical activity gradually as tolerated.  With the goal of moderate intensity exercise for 30 minutes a day 5 days a week.  In summary, reduce Entresto to 49/51 mg p.o. twice daily.  Fasting lipid profile obtained at baseline.  Start Repatha and fenofibrate and recheck labs in 6 weeks after initiation of pharmacological therapy.  FINAL MEDICATION LIST END OF ENCOUNTER: Meds ordered this encounter  Medications   fenofibrate (TRICOR) 145 MG tablet    Sig: Take 1 tablet (145 mg total) by mouth daily.    Dispense:  90 tablet    Refill:  0   Evolocumab (REPATHA SURECLICK) 833 MG/ML SOAJ    Sig: Inject 140 mg into the skin every 14 (fourteen) days for 6 doses.    Dispense:  2 mL    Refill:  3   sacubitril-valsartan (ENTRESTO) 49-51 MG    Sig: Take 1 tablet by mouth 2 (two) times daily.    Dispense:  60 tablet    Refill:  2     Current Outpatient Medications:    acetaminophen (TYLENOL) 650 MG CR tablet, Take 650 mg by mouth every 8 (eight) hours as needed for pain., Disp: , Rfl:    albuterol (VENTOLIN HFA) 108 (90 Base) MCG/ACT  inhaler, Inhale into the lungs every 6 (six) hours as needed for wheezing or shortness of breath., Disp: ,  Rfl:    amoxicillin-clavulanate (AUGMENTIN) 875-125 MG tablet, Take 1 tablet by mouth 2 (two) times daily., Disp: , Rfl:    BD PEN NEEDLE NANO 2ND GEN 32G X 4 MM MISC, , Disp: , Rfl:    dorzolamide-timolol (COSOPT) 22.3-6.8 MG/ML ophthalmic solution, 1 drop 2 (two) times daily., Disp: , Rfl:    ELIQUIS 5 MG TABS tablet, TAKE 1 TABLET BY MOUTH EVERY 12 HOURS, Disp: 60 tablet, Rfl: 5   Evolocumab (REPATHA SURECLICK) 939 MG/ML SOAJ, Inject 140 mg into the skin every 14 (fourteen) days for 6 doses., Disp: 2 mL, Rfl: 3   fenofibrate (TRICOR) 145 MG tablet, Take 1 tablet (145 mg total) by mouth daily., Disp: 90 tablet, Rfl: 0   fluticasone (FLONASE) 50 MCG/ACT nasal spray, Place into both nostrils daily., Disp: , Rfl:    folic acid (FOLVITE) 1 MG tablet, Take 1 mg by mouth daily., Disp: , Rfl:    HUMULIN R U-500 KWIKPEN 500 UNIT/ML kwikpen, Inject 125-150 Units into the skin 2 (two) times daily. , Disp: , Rfl:    JARDIANCE 10 MG TABS tablet, Take 10 mg by mouth daily., Disp: , Rfl:    latanoprost (XALATAN) 0.005 % ophthalmic solution, 1 drop at bedtime., Disp: , Rfl:    levothyroxine (SYNTHROID, LEVOTHROID) 100 MCG tablet, Take 100 mcg by mouth daily., Disp: , Rfl:    methotrexate 2.5 MG tablet, Take 5 mg by mouth once a week. $Remov'5mg'qQyLnA$  in Morning, $RemoveBef'5mg'ktMfqfSNRD$  at night, Disp: , Rfl:    pantoprazole (PROTONIX) 40 MG tablet, TAKE 1 TABLET BY MOUTH TWICE A DAY, Disp: 180 tablet, Rfl: 0   sacubitril-valsartan (ENTRESTO) 49-51 MG, Take 1 tablet by mouth 2 (two) times daily., Disp: 60 tablet, Rfl: 2   sildenafil (VIAGRA) 100 MG tablet, Take 100 mg by mouth every 4 (four) hours as needed., Disp: , Rfl:    sotalol (BETAPACE) 80 MG tablet, TAKE 0.5 TABLETS BY MOUTH EVERY MORNING AND TAKE 1 TABLET IN THE EVENING, Disp: 135 tablet, Rfl: 2   TRULICITY 0.30 SP/2.3RA SOPN, INJECT 0.75 MG ONCE A WEEK SUBCUTANEOUS 30 DAYS, Disp: , Rfl:    nitroGLYCERIN (NITROSTAT) 0.4 MG SL tablet, Place 1 tablet (0.4 mg total) under the tongue  every 5 (five) minutes as needed for chest pain. If you require more than two tablets five minutes apart go to the nearest ER via EMS., Disp: 30 tablet, Rfl: 0  Orders Placed This Encounter  Procedures   Pro b natriuretic peptide (BNP)   Hemoglobin and hematocrit, blood   CMP14+EGFR   Lipid Panel With LDL/HDL Ratio   LDL cholesterol, direct   EKG 12-Lead   --Continue cardiac medications as reconciled in final medication list. --Return in about 3 months (around 08/29/2021) for Follow up, heart failure management., Lipid. Or sooner if needed. --Continue follow-up with your primary care physician regarding the management of your other chronic comorbid conditions.  Patient's questions and concerns were addressed to his satisfaction. He voices understanding of the instructions provided during this encounter.   This note was created using a voice recognition software as a result there may be grammatical errors inadvertently enclosed that do not reflect the nature of this encounter. Every attempt is made to correct such errors.  Rex Kras, [place], [facility]  Pager: [phone] Office: [phone]

## 2021-06-03 ENCOUNTER — Telehealth: Payer: Self-pay

## 2021-06-03 NOTE — Telephone Encounter (Signed)
yes

## 2021-06-03 NOTE — Telephone Encounter (Signed)
Patient called to confirm that he supposed to be taking both Fenofibrate and Repatha. Please advise.

## 2021-06-03 NOTE — Telephone Encounter (Signed)
Called and spoke with patient, he will take them both as directed.

## 2021-06-07 DIAGNOSIS — I5022 Chronic systolic (congestive) heart failure: Secondary | ICD-10-CM | POA: Diagnosis not present

## 2021-06-07 DIAGNOSIS — Z7901 Long term (current) use of anticoagulants: Secondary | ICD-10-CM | POA: Diagnosis not present

## 2021-06-07 DIAGNOSIS — E782 Mixed hyperlipidemia: Secondary | ICD-10-CM | POA: Diagnosis not present

## 2021-06-07 DIAGNOSIS — Z79899 Other long term (current) drug therapy: Secondary | ICD-10-CM | POA: Diagnosis not present

## 2021-06-14 DIAGNOSIS — H409 Unspecified glaucoma: Secondary | ICD-10-CM | POA: Diagnosis not present

## 2021-06-14 DIAGNOSIS — E1142 Type 2 diabetes mellitus with diabetic polyneuropathy: Secondary | ICD-10-CM | POA: Diagnosis not present

## 2021-06-14 DIAGNOSIS — E1136 Type 2 diabetes mellitus with diabetic cataract: Secondary | ICD-10-CM | POA: Diagnosis not present

## 2021-06-14 DIAGNOSIS — Z794 Long term (current) use of insulin: Secondary | ICD-10-CM | POA: Diagnosis not present

## 2021-06-16 NOTE — Progress Notes (Signed)
External Labs: Collected: 06/07/2021 provided by PCP. BUN 11, creatinine 0.9. eGFR 90. Sodium 141, potassium 4, chloride 103, bicarb 27 AST 23, ALT 27, alkaline phosphatase 113 Hemoglobin 16.9 g/dL, hematocrit 50.3% BNP 8.2 Total cholesterol 173, triglycerides 259, HDL 42, LDL 88

## 2021-06-21 DIAGNOSIS — Z6838 Body mass index (BMI) 38.0-38.9, adult: Secondary | ICD-10-CM | POA: Diagnosis not present

## 2021-06-21 DIAGNOSIS — N400 Enlarged prostate without lower urinary tract symptoms: Secondary | ICD-10-CM | POA: Diagnosis not present

## 2021-06-21 DIAGNOSIS — G4733 Obstructive sleep apnea (adult) (pediatric): Secondary | ICD-10-CM | POA: Diagnosis not present

## 2021-06-21 DIAGNOSIS — I5030 Unspecified diastolic (congestive) heart failure: Secondary | ICD-10-CM | POA: Diagnosis not present

## 2021-06-21 DIAGNOSIS — I2510 Atherosclerotic heart disease of native coronary artery without angina pectoris: Secondary | ICD-10-CM | POA: Diagnosis not present

## 2021-06-21 DIAGNOSIS — E039 Hypothyroidism, unspecified: Secondary | ICD-10-CM | POA: Diagnosis not present

## 2021-06-21 DIAGNOSIS — K219 Gastro-esophageal reflux disease without esophagitis: Secondary | ICD-10-CM | POA: Diagnosis not present

## 2021-06-21 DIAGNOSIS — Z139 Encounter for screening, unspecified: Secondary | ICD-10-CM | POA: Diagnosis not present

## 2021-06-21 DIAGNOSIS — M069 Rheumatoid arthritis, unspecified: Secondary | ICD-10-CM | POA: Diagnosis not present

## 2021-06-21 DIAGNOSIS — I480 Paroxysmal atrial fibrillation: Secondary | ICD-10-CM | POA: Diagnosis not present

## 2021-06-21 DIAGNOSIS — E118 Type 2 diabetes mellitus with unspecified complications: Secondary | ICD-10-CM | POA: Diagnosis not present

## 2021-06-21 DIAGNOSIS — I10 Essential (primary) hypertension: Secondary | ICD-10-CM | POA: Diagnosis not present

## 2021-06-23 ENCOUNTER — Other Ambulatory Visit: Payer: Self-pay | Admitting: Cardiology

## 2021-06-23 DIAGNOSIS — E781 Pure hyperglyceridemia: Secondary | ICD-10-CM

## 2021-07-19 DIAGNOSIS — Z6838 Body mass index (BMI) 38.0-38.9, adult: Secondary | ICD-10-CM | POA: Diagnosis not present

## 2021-07-19 DIAGNOSIS — Z1331 Encounter for screening for depression: Secondary | ICD-10-CM | POA: Diagnosis not present

## 2021-07-19 DIAGNOSIS — B372 Candidiasis of skin and nail: Secondary | ICD-10-CM | POA: Diagnosis not present

## 2021-07-25 ENCOUNTER — Telehealth: Payer: Self-pay | Admitting: Cardiology

## 2021-07-25 NOTE — Telephone Encounter (Signed)
Lennette Bihari with [employer] calling because they received an expedited authorization for repatha for this patient. He says they need someone to fax over a diagnosis, and labs or chart notes with lab info as soon as possible. If anyone needs to contact Lennette Bihari, he says he is available until 1 PM today, phone # [phone]. Fax # [phone]. ?

## 2021-07-26 NOTE — Telephone Encounter (Signed)
Labs and ov notes have been faxed

## 2021-07-29 ENCOUNTER — Other Ambulatory Visit: Payer: Self-pay

## 2021-07-29 DIAGNOSIS — I5022 Chronic systolic (congestive) heart failure: Secondary | ICD-10-CM

## 2021-07-29 DIAGNOSIS — B372 Candidiasis of skin and nail: Secondary | ICD-10-CM | POA: Diagnosis not present

## 2021-07-29 DIAGNOSIS — Z1331 Encounter for screening for depression: Secondary | ICD-10-CM | POA: Diagnosis not present

## 2021-07-29 DIAGNOSIS — E119 Type 2 diabetes mellitus without complications: Secondary | ICD-10-CM

## 2021-07-29 DIAGNOSIS — L723 Sebaceous cyst: Secondary | ICD-10-CM | POA: Diagnosis not present

## 2021-07-29 DIAGNOSIS — E7800 Pure hypercholesterolemia, unspecified: Secondary | ICD-10-CM

## 2021-07-29 DIAGNOSIS — Z6838 Body mass index (BMI) 38.0-38.9, adult: Secondary | ICD-10-CM | POA: Diagnosis not present

## 2021-07-29 DIAGNOSIS — Z789 Other specified health status: Secondary | ICD-10-CM

## 2021-07-29 MED ORDER — REPATHA SURECLICK 140 MG/ML ~~LOC~~ SOAJ: 140.0000 mg | SUBCUTANEOUS | 3 refills | Status: DC

## 2021-08-23 DIAGNOSIS — I5030 Unspecified diastolic (congestive) heart failure: Secondary | ICD-10-CM | POA: Diagnosis not present

## 2021-08-23 DIAGNOSIS — R059 Cough, unspecified: Secondary | ICD-10-CM | POA: Diagnosis not present

## 2021-08-23 DIAGNOSIS — D6869 Other thrombophilia: Secondary | ICD-10-CM | POA: Diagnosis not present

## 2021-08-23 DIAGNOSIS — M0550 Rheumatoid polyneuropathy with rheumatoid arthritis of unspecified site: Secondary | ICD-10-CM | POA: Diagnosis not present

## 2021-08-23 DIAGNOSIS — Z7901 Long term (current) use of anticoagulants: Secondary | ICD-10-CM | POA: Diagnosis not present

## 2021-08-23 DIAGNOSIS — I4891 Unspecified atrial fibrillation: Secondary | ICD-10-CM | POA: Diagnosis not present

## 2021-08-23 DIAGNOSIS — Z794 Long term (current) use of insulin: Secondary | ICD-10-CM | POA: Diagnosis not present

## 2021-08-23 DIAGNOSIS — E1151 Type 2 diabetes mellitus with diabetic peripheral angiopathy without gangrene: Secondary | ICD-10-CM | POA: Diagnosis not present

## 2021-08-23 DIAGNOSIS — Z6836 Body mass index (BMI) 36.0-36.9, adult: Secondary | ICD-10-CM | POA: Diagnosis not present

## 2021-08-23 DIAGNOSIS — I25118 Atherosclerotic heart disease of native coronary artery with other forms of angina pectoris: Secondary | ICD-10-CM | POA: Diagnosis not present

## 2021-08-23 DIAGNOSIS — E1169 Type 2 diabetes mellitus with other specified complication: Secondary | ICD-10-CM | POA: Diagnosis not present

## 2021-08-23 DIAGNOSIS — R5383 Other fatigue: Secondary | ICD-10-CM | POA: Diagnosis not present

## 2021-08-23 DIAGNOSIS — G4733 Obstructive sleep apnea (adult) (pediatric): Secondary | ICD-10-CM | POA: Diagnosis not present

## 2021-08-25 ENCOUNTER — Other Ambulatory Visit: Payer: Self-pay | Admitting: Cardiology

## 2021-08-25 DIAGNOSIS — I5022 Chronic systolic (congestive) heart failure: Secondary | ICD-10-CM

## 2021-08-26 DIAGNOSIS — E662 Morbid (severe) obesity with alveolar hypoventilation: Secondary | ICD-10-CM | POA: Diagnosis not present

## 2021-08-29 DIAGNOSIS — E669 Obesity, unspecified: Secondary | ICD-10-CM | POA: Diagnosis not present

## 2021-08-29 DIAGNOSIS — Z6837 Body mass index (BMI) 37.0-37.9, adult: Secondary | ICD-10-CM | POA: Diagnosis not present

## 2021-08-29 DIAGNOSIS — M1A09X0 Idiopathic chronic gout, multiple sites, without tophus (tophi): Secondary | ICD-10-CM | POA: Diagnosis not present

## 2021-08-29 DIAGNOSIS — M5136 Other intervertebral disc degeneration, lumbar region: Secondary | ICD-10-CM | POA: Diagnosis not present

## 2021-08-29 DIAGNOSIS — M0609 Rheumatoid arthritis without rheumatoid factor, multiple sites: Secondary | ICD-10-CM | POA: Diagnosis not present

## 2021-08-30 ENCOUNTER — Ambulatory Visit: Payer: PPO | Admitting: Cardiology

## 2021-09-06 DIAGNOSIS — E119 Type 2 diabetes mellitus without complications: Secondary | ICD-10-CM | POA: Diagnosis not present

## 2021-09-06 DIAGNOSIS — H401123 Primary open-angle glaucoma, left eye, severe stage: Secondary | ICD-10-CM | POA: Diagnosis not present

## 2021-09-06 DIAGNOSIS — H2511 Age-related nuclear cataract, right eye: Secondary | ICD-10-CM | POA: Diagnosis not present

## 2021-09-06 DIAGNOSIS — H524 Presbyopia: Secondary | ICD-10-CM | POA: Diagnosis not present

## 2021-09-13 ENCOUNTER — Encounter: Payer: Self-pay | Admitting: Cardiology

## 2021-09-13 ENCOUNTER — Ambulatory Visit: Payer: PPO | Admitting: Cardiology

## 2021-09-13 VITALS — BP 110/57 | HR 92 | Temp 98.2°F | Resp 16 | Ht 70.0 in | Wt 258.2 lb

## 2021-09-13 DIAGNOSIS — E119 Type 2 diabetes mellitus without complications: Secondary | ICD-10-CM

## 2021-09-13 DIAGNOSIS — G4733 Obstructive sleep apnea (adult) (pediatric): Secondary | ICD-10-CM | POA: Diagnosis not present

## 2021-09-13 DIAGNOSIS — E7800 Pure hypercholesterolemia, unspecified: Secondary | ICD-10-CM | POA: Diagnosis not present

## 2021-09-13 DIAGNOSIS — Z789 Other specified health status: Secondary | ICD-10-CM | POA: Diagnosis not present

## 2021-09-13 DIAGNOSIS — E781 Pure hyperglyceridemia: Secondary | ICD-10-CM | POA: Diagnosis not present

## 2021-09-13 DIAGNOSIS — Z7901 Long term (current) use of anticoagulants: Secondary | ICD-10-CM | POA: Diagnosis not present

## 2021-09-13 DIAGNOSIS — E1142 Type 2 diabetes mellitus with diabetic polyneuropathy: Secondary | ICD-10-CM | POA: Diagnosis not present

## 2021-09-13 DIAGNOSIS — I480 Paroxysmal atrial fibrillation: Secondary | ICD-10-CM

## 2021-09-13 DIAGNOSIS — Z79899 Other long term (current) drug therapy: Secondary | ICD-10-CM | POA: Diagnosis not present

## 2021-09-13 DIAGNOSIS — I10 Essential (primary) hypertension: Secondary | ICD-10-CM

## 2021-09-13 DIAGNOSIS — Z87891 Personal history of nicotine dependence: Secondary | ICD-10-CM | POA: Diagnosis not present

## 2021-09-13 DIAGNOSIS — Z794 Long term (current) use of insulin: Secondary | ICD-10-CM | POA: Diagnosis not present

## 2021-09-13 DIAGNOSIS — I5022 Chronic systolic (congestive) heart failure: Secondary | ICD-10-CM

## 2021-09-13 DIAGNOSIS — E1136 Type 2 diabetes mellitus with diabetic cataract: Secondary | ICD-10-CM | POA: Diagnosis not present

## 2021-09-13 MED ORDER — ENTRESTO 49-51 MG PO TABS: 1.0000 | ORAL_TABLET | Freq: Two times a day (BID) | ORAL | 0 refills | Status: AC

## 2021-09-13 NOTE — Progress Notes (Signed)
? ?Joshua Lara ?Date of Birth: 01/04/1946 ?MRN: [id number] ?Primary Care Provider:[name], Ovid Curd, PA-C ?Former Cardiology Providers: Jeri Lager, APRN, FNP-C ?Primary Cardiologist: Rex Kras, DO, [facility] (established care 10/28/2019) ? ?Date: 09/13/21 ?Last Office Visit: 05/31/2021 ? ?Chief Complaint  ?Patient presents with  ? heart failure management  ? Hyperlipidemia  ? Follow-up  ? ? ?HPI  ?Joshua Lara is a 76 y.o.  male whose past medical history and cardiovascular risk factors include: Paroxysmal atrial fibrillation, oral anticoagulation, long-term antiarrhythmic medication, heart failure with reduced EF, stage C, NYHA class II, hypertension, hyperlipidemia, OSA on CPAP, diabetes mellitus, glaucoma, obesity due to excess calories, advanced age. ? ?Presents for 53-monthfollow-up visit for management of PAF, HFrEF, and hyperlipidemia. ? ?Since last office visit patient states that he has been feeling tired, fatigued.  He denies any evidence of blood loss.  At last office visit he was recommended to reduce his Entresto from 97/103 mg p.o. twice daily to 49/51 mg p.o. twice daily.  For unknown reasons he continues to take Entresto 97/103 mg p.o. twice daily which may be contributing to his overall symptoms.  EKG was performed to evaluate his underlying rhythm and he continues to be in sinus. ? ?Since last office visit he has started Repatha and is tolerating the medication well for the management of hyperlipidemia.  He has not had any repeat lipid profile to reassess drug therapy and LFTs.  I have asked him to get labs done which were ordered at last visit. ? ?ALLERGIES: ?Allergies  ?Allergen Reactions  ? Bactrim [Sulfamethoxazole-Trimethoprim] Swelling  ? Zantac [Ranitidine Hcl] Anaphylaxis  ?  Has anaphylactic shock--intubated and in ICU x 4 days   ? Etanercept Other (See Comments) and Cough  ?  (ENBREL); Patient thinks it contributed to heart issues  ? Etanercept   ?  Other reaction(s): Cough  (ALLERGY/intolerance), Other (See Comments) ?(ENBREL); Patient thinks it contributed to heart issues  ? Sulfa Antibiotics Other (See Comments)  ? Tape Itching  ?  Can only tolerate bandages for less than 24-hr periods ?Can only tolerate bandages for less than 24-hr periods  ? ? ? ?MEDICATION LIST PRIOR TO VISIT: ?Current Outpatient Medications on File Prior to Visit  ?Medication Sig Dispense Refill  ? acetaminophen (TYLENOL) 650 MG CR tablet Take 650 mg by mouth every 8 (eight) hours as needed for pain.    ? albuterol (VENTOLIN HFA) 108 (90 Base) MCG/ACT inhaler Inhale into the lungs every 6 (six) hours as needed for wheezing or shortness of breath.    ? BD PEN NEEDLE NANO 2ND GEN 32G X 4 MM MISC     ? dorzolamide-timolol (COSOPT) 22.3-6.8 MG/ML ophthalmic solution 1 drop 2 (two) times daily.    ? ELIQUIS 5 MG TABS tablet TAKE 1 TABLET BY MOUTH EVERY 12 HOURS 60 tablet 5  ? Evolocumab (REPATHA SURECLICK) 1793MG/ML SOAJ Inject 140 mg into the skin every 14 (fourteen) days for 6 doses. 2 mL 3  ? fenofibrate (TRICOR) 145 MG tablet TAKE 1 TABLET BY MOUTH EVERY DAY 90 tablet 0  ? fluticasone (FLONASE) 50 MCG/ACT nasal spray Place into both nostrils daily.    ? folic acid (FOLVITE) 1 MG tablet Take 1 mg by mouth daily.    ? HUMULIN R U-500 KWIKPEN 500 UNIT/ML kwikpen Inject 125-150 Units into the skin 2 (two) times daily.     ? JARDIANCE 10 MG TABS tablet Take 10 mg by mouth daily.    ? latanoprost (XALATAN) 0.005 %  ophthalmic solution 1 drop at bedtime.    ? levothyroxine (SYNTHROID, LEVOTHROID) 100 MCG tablet Take 100 mcg by mouth daily.    ? methotrexate 2.5 MG tablet Take 5 mg by mouth once a week. '5mg'$  in Morning, '5mg'$  at night    ? nitroGLYCERIN (NITROSTAT) 0.4 MG SL tablet Place 1 tablet (0.4 mg total) under the tongue every 5 (five) minutes as needed for chest pain. If you require more than two tablets five minutes apart go to the nearest ER via EMS. 30 tablet 0  ? pantoprazole (PROTONIX) 40 MG tablet TAKE 1 TABLET  BY MOUTH TWICE A DAY 180 tablet 0  ? sildenafil (VIAGRA) 100 MG tablet Take 100 mg by mouth every 4 (four) hours as needed.    ? sotalol (BETAPACE) 80 MG tablet TAKE 0.5 TABLETS BY MOUTH EVERY MORNING AND TAKE 1 TABLET IN THE EVENING 135 tablet 2  ? tamsulosin (FLOMAX) 0.4 MG CAPS capsule SMARTSIG:1 Capsule(s) By Mouth Every Evening    ? TRULICITY 8.34 HD/6.2IW SOPN INJECT 0.75 MG ONCE A WEEK SUBCUTANEOUS 30 DAYS    ? ?No current facility-administered medications on file prior to visit.  ? ? ?PAST MEDICAL HISTORY: ?Past Medical History:  ?Diagnosis Date  ? Arthritis   ? "hands, back, ankles" (05/11/2014)  ? CHF (congestive heart failure) ([place])   ? Chronic lower back pain   ? Compression fracture of lumbar vertebra ([facility])   ? Coronary artery disease   ? Daily headache   ? "recently" (05/11/2014)  ? Depression   ? "I might be slight" (05/11/2014)  ? GERD (gastroesophageal reflux disease)   ? High cholesterol   ? Hypertension   ? Hypothyroid   ? OSA treated with BiPAP   ? Pneumonia   ? "couple times; last time was 03/2011" (05/11/2014)  ? Type II diabetes mellitus ([place])   ? ? ?PAST SURGICAL HISTORY: ?Past Surgical History:  ?Procedure Laterality Date  ? APPENDECTOMY    ? CARDIAC CATHETERIZATION  2001; 2009  ? CATARACT EXTRACTION W/ INTRAOCULAR LENS IMPLANT Left   ? CHOLECYSTECTOMY  1980's  ? Archie Endo 07/13/2010   ? EYE MUSCLE SURGERY Left   ? LEFT AND RIGHT HEART CATHETERIZATION WITH CORONARY ANGIOGRAM N/A 05/30/2014  ? Procedure: LEFT AND RIGHT HEART CATHETERIZATION WITH CORONARY ANGIOGRAM;  Surgeon: Jolaine Artist, MD;  Location: [facility] CATH LAB;  Service: Cardiovascular;  Laterality: N/A;  ? LEFT HEART CATH AND CORONARY ANGIOGRAPHY N/A 12/20/2018  ? Procedure: LEFT HEART CATH AND CORONARY ANGIOGRAPHY and possible intervention;  Surgeon: Adrian Prows, MD;  Location: [place] CV LAB;  Service: Cardiovascular;  Laterality: N/A;  ? MASTOIDECTOMY Right 1970's  ? Archie Endo 07/13/2010  ? SHOULDER OPEN ROTATOR CUFF REPAIR Left 07/2010   ? SPHINCTEROTOMY    ? TONSILLECTOMY AND ADENOIDECTOMY    ? ? ?FAMILY HISTORY: ?The patient's family history includes Heart attack in his father; Kidney failure in his mother; Pancreatic cancer in his brother. ?  ?SOCIAL HISTORY:  ?The patient  reports that he quit smoking about 31 years ago. His smoking use included cigarettes. He has a 60.00 pack-year smoking history. He has never used smokeless tobacco. He reports current alcohol use. He reports that he does not use drugs. ? ?Review of Systems  ?Constitutional: Positive for malaise/fatigue. Negative for chills and fever.  ?HENT:  Negative for hoarse voice and nosebleeds.   ?Eyes:  Negative for discharge, double vision and pain.  ?Cardiovascular:  Positive for dyspnea on exertion (chronic). Negative for chest  pain, claudication, leg swelling, near-syncope, orthopnea, palpitations, paroxysmal nocturnal dyspnea and syncope.  ?Respiratory:  Negative for hemoptysis and shortness of breath.   ?Musculoskeletal:  Negative for muscle cramps and myalgias.  ?Gastrointestinal:  Negative for abdominal pain, constipation, diarrhea, hematemesis, hematochezia, melena, nausea and vomiting.  ?Neurological:  Negative for dizziness, light-headedness and vertigo.  ? ?PHYSICAL EXAM: ? ?  09/13/2021  ? 12:02 PM 09/13/2021  ? 11:38 AM 09/13/2021  ? 11:36 AM  ?Vitals with BMI  ?Height   '5\' 10"'$   ?Weight   258 lbs 3 oz  ?BMI   37.05  ?Systolic 161 81 78  ?Diastolic 57 44 57  ?Pulse 92 91 96  ? ?CONSTITUTIONAL: Caucasian gentleman, hemodynamically stable, no acute distress. ?SKIN: Skin is warm and dry. No rash noted. No cyanosis. No pallor. No jaundice ?HEAD: Normocephalic and atraumatic.  ?EYES: No scleral icterus ?MOUTH/THROAT: Moist oral membranes.  ?NECK: No JVD present. No thyromegaly noted. No carotid bruits  ?LYMPHATIC: No visible cervical adenopathy. ?CHEST Normal respiratory effort. No intercostal retractions  ?LUNGS: Clear to auscultation bilaterally. No stridor. No wheezes. No rales.   ?CARDIOVASCULAR: Regular rate and rhythm, positive S1-S2, no murmurs rubs or gallops appreciated. ?ABDOMINAL: Obese, soft, nontender, nondistended, positive bowel sounds all 4 quadrants. No apparent ascites.  ?EXT

## 2021-09-18 ENCOUNTER — Other Ambulatory Visit: Payer: Self-pay | Admitting: Cardiology

## 2021-09-18 DIAGNOSIS — E781 Pure hyperglyceridemia: Secondary | ICD-10-CM

## 2021-09-20 DIAGNOSIS — K1123 Chronic sialoadenitis: Secondary | ICD-10-CM | POA: Diagnosis not present

## 2021-09-20 DIAGNOSIS — H9072 Mixed conductive and sensorineural hearing loss, unilateral, left ear, with unrestricted hearing on the contralateral side: Secondary | ICD-10-CM | POA: Diagnosis not present

## 2021-09-20 DIAGNOSIS — H6123 Impacted cerumen, bilateral: Secondary | ICD-10-CM | POA: Diagnosis not present

## 2021-09-20 DIAGNOSIS — H7201 Central perforation of tympanic membrane, right ear: Secondary | ICD-10-CM | POA: Diagnosis not present

## 2021-09-20 DIAGNOSIS — H6982 Other specified disorders of Eustachian tube, left ear: Secondary | ICD-10-CM | POA: Diagnosis not present

## 2021-10-10 DIAGNOSIS — J101 Influenza due to other identified influenza virus with other respiratory manifestations: Secondary | ICD-10-CM | POA: Diagnosis not present

## 2021-10-10 DIAGNOSIS — Z20822 Contact with and (suspected) exposure to covid-19: Secondary | ICD-10-CM | POA: Diagnosis not present

## 2021-10-11 DIAGNOSIS — H903 Sensorineural hearing loss, bilateral: Secondary | ICD-10-CM | POA: Diagnosis not present

## 2021-10-20 ENCOUNTER — Other Ambulatory Visit: Payer: Self-pay | Admitting: Cardiology

## 2021-10-20 DIAGNOSIS — Z20822 Contact with and (suspected) exposure to covid-19: Secondary | ICD-10-CM | POA: Diagnosis not present

## 2021-10-20 DIAGNOSIS — I5022 Chronic systolic (congestive) heart failure: Secondary | ICD-10-CM

## 2021-10-20 DIAGNOSIS — E119 Type 2 diabetes mellitus without complications: Secondary | ICD-10-CM | POA: Diagnosis not present

## 2021-10-20 DIAGNOSIS — J069 Acute upper respiratory infection, unspecified: Secondary | ICD-10-CM | POA: Diagnosis not present

## 2021-10-20 DIAGNOSIS — J019 Acute sinusitis, unspecified: Secondary | ICD-10-CM | POA: Diagnosis not present

## 2021-10-20 DIAGNOSIS — I482 Chronic atrial fibrillation, unspecified: Secondary | ICD-10-CM | POA: Diagnosis not present

## 2021-10-20 DIAGNOSIS — Z794 Long term (current) use of insulin: Secondary | ICD-10-CM

## 2021-10-20 DIAGNOSIS — Z789 Other specified health status: Secondary | ICD-10-CM

## 2021-10-20 DIAGNOSIS — I509 Heart failure, unspecified: Secondary | ICD-10-CM | POA: Diagnosis not present

## 2021-10-20 DIAGNOSIS — E7800 Pure hypercholesterolemia, unspecified: Secondary | ICD-10-CM

## 2021-10-28 DIAGNOSIS — G4733 Obstructive sleep apnea (adult) (pediatric): Secondary | ICD-10-CM | POA: Diagnosis not present

## 2021-11-07 DIAGNOSIS — H25811 Combined forms of age-related cataract, right eye: Secondary | ICD-10-CM | POA: Diagnosis not present

## 2021-11-07 DIAGNOSIS — H25041 Posterior subcapsular polar age-related cataract, right eye: Secondary | ICD-10-CM | POA: Diagnosis not present

## 2021-11-07 DIAGNOSIS — I4891 Unspecified atrial fibrillation: Secondary | ICD-10-CM | POA: Diagnosis not present

## 2021-11-07 DIAGNOSIS — H25011 Cortical age-related cataract, right eye: Secondary | ICD-10-CM | POA: Diagnosis not present

## 2021-11-07 DIAGNOSIS — H269 Unspecified cataract: Secondary | ICD-10-CM | POA: Diagnosis not present

## 2021-11-07 DIAGNOSIS — H2511 Age-related nuclear cataract, right eye: Secondary | ICD-10-CM | POA: Diagnosis not present

## 2021-11-29 DIAGNOSIS — M0609 Rheumatoid arthritis without rheumatoid factor, multiple sites: Secondary | ICD-10-CM | POA: Diagnosis not present

## 2021-12-04 ENCOUNTER — Other Ambulatory Visit: Payer: Self-pay | Admitting: Cardiology

## 2021-12-04 DIAGNOSIS — I5033 Acute on chronic diastolic (congestive) heart failure: Secondary | ICD-10-CM

## 2021-12-13 DIAGNOSIS — E1136 Type 2 diabetes mellitus with diabetic cataract: Secondary | ICD-10-CM | POA: Diagnosis not present

## 2021-12-13 DIAGNOSIS — E1142 Type 2 diabetes mellitus with diabetic polyneuropathy: Secondary | ICD-10-CM | POA: Diagnosis not present

## 2021-12-13 DIAGNOSIS — H409 Unspecified glaucoma: Secondary | ICD-10-CM | POA: Diagnosis not present

## 2021-12-13 DIAGNOSIS — E039 Hypothyroidism, unspecified: Secondary | ICD-10-CM | POA: Diagnosis not present

## 2021-12-13 DIAGNOSIS — Z794 Long term (current) use of insulin: Secondary | ICD-10-CM | POA: Diagnosis not present

## 2022-01-03 DIAGNOSIS — I2510 Atherosclerotic heart disease of native coronary artery without angina pectoris: Secondary | ICD-10-CM | POA: Diagnosis not present

## 2022-01-03 DIAGNOSIS — M069 Rheumatoid arthritis, unspecified: Secondary | ICD-10-CM | POA: Diagnosis not present

## 2022-01-03 DIAGNOSIS — E039 Hypothyroidism, unspecified: Secondary | ICD-10-CM | POA: Diagnosis not present

## 2022-01-03 DIAGNOSIS — I5030 Unspecified diastolic (congestive) heart failure: Secondary | ICD-10-CM | POA: Diagnosis not present

## 2022-01-03 DIAGNOSIS — I480 Paroxysmal atrial fibrillation: Secondary | ICD-10-CM | POA: Diagnosis not present

## 2022-01-03 DIAGNOSIS — E118 Type 2 diabetes mellitus with unspecified complications: Secondary | ICD-10-CM | POA: Diagnosis not present

## 2022-01-03 DIAGNOSIS — K219 Gastro-esophageal reflux disease without esophagitis: Secondary | ICD-10-CM | POA: Diagnosis not present

## 2022-01-03 DIAGNOSIS — G4733 Obstructive sleep apnea (adult) (pediatric): Secondary | ICD-10-CM | POA: Diagnosis not present

## 2022-01-03 DIAGNOSIS — Z125 Encounter for screening for malignant neoplasm of prostate: Secondary | ICD-10-CM | POA: Diagnosis not present

## 2022-01-03 DIAGNOSIS — G4762 Sleep related leg cramps: Secondary | ICD-10-CM | POA: Diagnosis not present

## 2022-01-03 DIAGNOSIS — I10 Essential (primary) hypertension: Secondary | ICD-10-CM | POA: Diagnosis not present

## 2022-01-03 DIAGNOSIS — Z79899 Other long term (current) drug therapy: Secondary | ICD-10-CM | POA: Diagnosis not present

## 2022-01-03 DIAGNOSIS — N400 Enlarged prostate without lower urinary tract symptoms: Secondary | ICD-10-CM | POA: Diagnosis not present

## 2022-01-04 DIAGNOSIS — G4733 Obstructive sleep apnea (adult) (pediatric): Secondary | ICD-10-CM | POA: Diagnosis not present

## 2022-01-20 ENCOUNTER — Other Ambulatory Visit: Payer: Self-pay | Admitting: Cardiology

## 2022-01-20 DIAGNOSIS — E781 Pure hyperglyceridemia: Secondary | ICD-10-CM

## 2022-01-24 DIAGNOSIS — G4733 Obstructive sleep apnea (adult) (pediatric): Secondary | ICD-10-CM | POA: Diagnosis not present

## 2022-01-24 DIAGNOSIS — R059 Cough, unspecified: Secondary | ICD-10-CM | POA: Diagnosis not present

## 2022-01-24 DIAGNOSIS — R5383 Other fatigue: Secondary | ICD-10-CM | POA: Diagnosis not present

## 2022-02-19 ENCOUNTER — Telehealth: Payer: Self-pay

## 2022-02-19 NOTE — Patient Outreach (Signed)
  Care Coordination   02/19/2022 Name: [name] MRN: [id number] DOB: May 12, 1946   Care Coordination Outreach Attempts:  An unsuccessful telephone outreach was attempted today to offer the patient information about available care coordination services as a benefit of their health plan.   Follow Up Plan:  Additional outreach attempts will be made to offer the patient care coordination information and services.   Encounter Outcome:  No Answer  Care Coordination Interventions Activated:  No   Care Coordination Interventions:  No, not indicated    Tomasa Rand, RN, BSN, CEN [facility] [employer] [phone]

## 2022-02-21 DIAGNOSIS — G4733 Obstructive sleep apnea (adult) (pediatric): Secondary | ICD-10-CM | POA: Diagnosis not present

## 2022-02-24 ENCOUNTER — Telehealth: Payer: Self-pay

## 2022-02-24 NOTE — Patient Outreach (Signed)
  Care Coordination   02/24/2022 Name: Joshua Lara MRN: [id number] DOB: 1946/04/03   Care Coordination Outreach Attempts:  A second unsuccessful outreach was attempted today to offer the patient with information about available care coordination services as a benefit of their health plan.     Follow Up Plan:  Additional outreach attempts will be made to offer the patient care coordination information and services.   Encounter Outcome:  No Answer  Care Coordination Interventions Activated:  No   Care Coordination Interventions:  No, not indicated    Tomasa Rand, RN, BSN, CEN [facility] [employer] [phone]

## 2022-03-06 DIAGNOSIS — I950 Idiopathic hypotension: Secondary | ICD-10-CM | POA: Diagnosis not present

## 2022-03-06 DIAGNOSIS — M5136 Other intervertebral disc degeneration, lumbar region: Secondary | ICD-10-CM | POA: Diagnosis not present

## 2022-03-06 DIAGNOSIS — M1A09X0 Idiopathic chronic gout, multiple sites, without tophus (tophi): Secondary | ICD-10-CM | POA: Diagnosis not present

## 2022-03-06 DIAGNOSIS — Z6837 Body mass index (BMI) 37.0-37.9, adult: Secondary | ICD-10-CM | POA: Diagnosis not present

## 2022-03-06 DIAGNOSIS — M0609 Rheumatoid arthritis without rheumatoid factor, multiple sites: Secondary | ICD-10-CM | POA: Diagnosis not present

## 2022-03-06 DIAGNOSIS — E669 Obesity, unspecified: Secondary | ICD-10-CM | POA: Diagnosis not present

## 2022-03-06 DIAGNOSIS — M7660 Achilles tendinitis, unspecified leg: Secondary | ICD-10-CM | POA: Diagnosis not present

## 2022-03-06 DIAGNOSIS — Z79899 Other long term (current) drug therapy: Secondary | ICD-10-CM | POA: Diagnosis not present

## 2022-03-07 DIAGNOSIS — I959 Hypotension, unspecified: Secondary | ICD-10-CM | POA: Diagnosis not present

## 2022-03-07 DIAGNOSIS — I5030 Unspecified diastolic (congestive) heart failure: Secondary | ICD-10-CM | POA: Diagnosis not present

## 2022-03-07 DIAGNOSIS — I10 Essential (primary) hypertension: Secondary | ICD-10-CM | POA: Diagnosis not present

## 2022-03-10 ENCOUNTER — Telehealth: Payer: Self-pay

## 2022-03-10 NOTE — Patient Outreach (Signed)
  Care Coordination   03/10/2022 Name: Joshua Lara MRN: [id number] DOB: April 21, 1946   Care Coordination Outreach Attempts:  A third unsuccessful outreach was attempted today to offer the patient with information about available care coordination services as a benefit of their health plan.   Follow Up Plan:  No further outreach attempts will be made at this time. We have been unable to contact the patient to offer or enroll patient in care coordination services  Encounter Outcome:  No Answer  Care Coordination Interventions Activated:  No   Care Coordination Interventions:  No, not indicated    Tomasa Rand, RN, BSN, CEN [place] Coordinator [phone]

## 2022-03-12 DIAGNOSIS — I10 Essential (primary) hypertension: Secondary | ICD-10-CM | POA: Diagnosis not present

## 2022-03-12 DIAGNOSIS — I959 Hypotension, unspecified: Secondary | ICD-10-CM | POA: Diagnosis not present

## 2022-03-12 DIAGNOSIS — I5030 Unspecified diastolic (congestive) heart failure: Secondary | ICD-10-CM | POA: Diagnosis not present

## 2022-03-12 DIAGNOSIS — Z9181 History of falling: Secondary | ICD-10-CM | POA: Diagnosis not present

## 2022-03-21 ENCOUNTER — Ambulatory Visit: Payer: PPO

## 2022-03-21 ENCOUNTER — Ambulatory Visit: Payer: PPO | Admitting: Cardiology

## 2022-03-21 VITALS — BP 127/82 | HR 85 | Ht 70.5 in | Wt 260.6 lb

## 2022-03-21 DIAGNOSIS — I480 Paroxysmal atrial fibrillation: Secondary | ICD-10-CM | POA: Diagnosis not present

## 2022-03-21 DIAGNOSIS — I5022 Chronic systolic (congestive) heart failure: Secondary | ICD-10-CM | POA: Diagnosis not present

## 2022-03-21 DIAGNOSIS — Z7901 Long term (current) use of anticoagulants: Secondary | ICD-10-CM

## 2022-03-21 DIAGNOSIS — Z789 Other specified health status: Secondary | ICD-10-CM

## 2022-03-21 DIAGNOSIS — I10 Essential (primary) hypertension: Secondary | ICD-10-CM

## 2022-03-21 DIAGNOSIS — G4733 Obstructive sleep apnea (adult) (pediatric): Secondary | ICD-10-CM

## 2022-03-21 DIAGNOSIS — E119 Type 2 diabetes mellitus without complications: Secondary | ICD-10-CM

## 2022-03-21 DIAGNOSIS — E7800 Pure hypercholesterolemia, unspecified: Secondary | ICD-10-CM

## 2022-03-21 DIAGNOSIS — E781 Pure hyperglyceridemia: Secondary | ICD-10-CM

## 2022-03-21 MED ORDER — SACUBITRIL-VALSARTAN 49-51 MG PO TABS: 1.0000 | ORAL_TABLET | Freq: Two times a day (BID) | ORAL | Status: DC

## 2022-03-21 MED ORDER — ENTRESTO 49-51 MG PO TABS: 1.0000 | ORAL_TABLET | Freq: Two times a day (BID) | ORAL | 3 refills | Status: DC

## 2022-03-21 NOTE — Progress Notes (Signed)
Joshua Lara Date of Birth: Oct 31, 1945 MRN: [id number] Primary Care Provider:[name], Tamala Lara Former Cardiology Providers: Joshua Lager, APRN, FNP-C Primary Cardiologist: Joshua Kras, DO, [facility] (established care 10/28/2019)  Date: 03/21/22 Last Office Visit: 05/31/2021  Chief Complaint  Patient presents with   Follow-up   Hypertension   Atrial Fibrillation   Coronary Artery Disease    HPI  Joshua Lara is a 76 y.o.  male whose past medical history and cardiovascular risk factors include: Paroxysmal atrial fibrillation, oral anticoagulation, long-term antiarrhythmic medication, heart failure with reduced EF, stage C, NYHA class II, hypertension, hyperlipidemia, OSA on CPAP, diabetes mellitus, glaucoma, obesity due to excess calories, advanced age.  Presents for 67-monthfollow-up visit for management of PAF, HFrEF, and hyperlipidemia.  At last a prior office visit he was recommended to reduce his Entresto from 97/103 mg p.o. twice daily to 49/51 mg p.o. twice daily.  For unknown reasons he continued to take Entresto 97/103 mg p.o. twice daily therefore this was once again reduced to 49/532mtwice daily at last office visit.  However he continued to take the original dose and thus EnDelene Lara discontinued altogether by primary care.  Overall he is feeling well today, only complaint being fatigue that has not progressively worsened. He does have chronic dyspnea on exertion that has remained stable. He denies chest pain, palpitations, leg edema, orthopnea, PND, syncope.  ALLERGIES: Allergies  Allergen Reactions   Bactrim [Sulfamethoxazole-Trimethoprim] Swelling   Ranitidine Anaphylaxis   Ranitidine Hcl Anaphylaxis and Other (See Comments)    Has anaphylactic shock--intubated and in ICU x 4 days   Adalimumab Other (See Comments)   Etanercept Other (See Comments) and Cough    (ENBREL); Patient thinks it contributed to heart issues   Etanercept     Other reaction(s): Cough  (ALLERGY/intolerance), Other (See Comments) (ENBREL); Patient thinks it contributed to heart issues   Sulfa Antibiotics Other (See Comments)   Sulfur Other (See Comments)   Tape Itching    Can only tolerate bandages for less than 24-hr periods Can only tolerate bandages for less than 24-hr periods    MEDICATION LIST PRIOR TO VISIT: Current Outpatient Medications on File Prior to Visit  Medication Sig Dispense Refill   acetaminophen (TYLENOL) 650 MG CR tablet Take 650 mg by mouth every 8 (eight) hours as needed for pain.     albuterol (VENTOLIN HFA) 108 (90 Base) MCG/ACT inhaler Inhale into the lungs every 6 (six) hours as needed for wheezing or shortness of breath.     BD PEN NEEDLE NANO 2ND GEN 32G X 4 MM MISC      dorzolamide-timolol (COSOPT) 22.3-6.8 MG/ML ophthalmic solution 1 drop 2 (two) times daily.     ELIQUIS 5 MG TABS tablet TAKE 1 TABLET BY MOUTH EVERY 12 HOURS 60 tablet 5   fenofibrate (TRICOR) 145 MG tablet TAKE 1 TABLET BY MOUTH EVERY DAY 90 tablet 0   fluticasone (FLONASE) 50 MCG/ACT nasal spray Place into both nostrils daily.     folic acid (FOLVITE) 1 MG tablet Take 1 mg by mouth daily.     HUMULIN R U-500 KWIKPEN 500 UNIT/ML kwikpen Inject 125-150 Units into the skin 2 (two) times daily.      JARDIANCE 10 MG TABS tablet Take 10 mg by mouth daily.     latanoprost (XALATAN) 0.005 % ophthalmic solution 1 drop at bedtime.     levothyroxine (SYNTHROID, LEVOTHROID) 100 MCG tablet Take 100 mcg by mouth daily.     methotrexate  2.5 MG tablet Take 5 mg by mouth once a week. 54m in Morning, 525mat night     nitroGLYCERIN (NITROSTAT) 0.4 MG SL tablet Place 1 tablet (0.4 mg total) under the tongue every 5 (five) minutes as needed for chest pain. If you require more than two tablets five minutes apart go to the nearest ER via EMS. 30 tablet 0   pantoprazole (PROTONIX) 40 MG tablet TAKE 1 TABLET BY MOUTH TWICE A DAY 180 tablet 0   REPATHA SURECLICK 14355G/ML SOAJ INJECT 140 MG INTO THE  SKIN EVERY 14 (FOURTEEN) DAYS 2 mL 3   sotalol (BETAPACE) 80 MG tablet TAKE 0.5 TABLETS BY MOUTH EVERY MORNING AND TAKE 1 TABLET IN THE EVENING (Patient taking differently: Take 40 mg by mouth 2 (two) times daily.) 135 tablet 2   tamsulosin (FLOMAX) 0.4 MG CAPS capsule SMARTSIG:1 Capsule(s) By Mouth Every Evening     TRULICITY 0.7.32GKG/2.5KYOPN INJECT 0.75 MG ONCE A WEEK SUBCUTANEOUS 30 DAYS     sildenafil (VIAGRA) 100 MG tablet Take 100 mg by mouth every 4 (four) hours as needed. (Patient not taking: Reported on 03/21/2022)     No current facility-administered medications on file prior to visit.    PAST MEDICAL HISTORY: Past Medical History:  Diagnosis Date   Arthritis    "hands, back, ankles" (05/11/2014)   CHF (congestive heart failure) (HCC)    Chronic lower back pain    Compression fracture of lumbar vertebra (HCC)    Coronary artery disease    Daily headache    "recently" (05/11/2014)   Depression    "I might be slight" (05/11/2014)   GERD (gastroesophageal reflux disease)    High cholesterol    Hypertension    Hypothyroid    OSA treated with BiPAP    Pneumonia    "couple times; last time was 03/2011" (05/11/2014)   Type II diabetes mellitus ([facility]    PAST SURGICAL HISTORY: Past Surgical History:  Procedure Laterality Date   APPENDECTOMY     CARDIAC CATHETERIZATION  2001; 2009   CATARACT EXTRACTION W/ INTRAOCULAR LENS IMPLANT Left    CHOLECYSTECTOMY  1980's   /nArchie Endo/07/2010    EYE MUSCLE SURGERY Left    LEFT AND RIGHT HEART CATHETERIZATION WITH CORONARY ANGIOGRAM N/A 05/30/2014   Procedure: LEFT AND RIGHT HEART CATHETERIZATION WITH CORONARY ANGIOGRAM;  Surgeon: DaJolaine ArtistMD;  [facility];  Service: Cardiovascular;  Laterality: N/A;   LEFT HEART CATH AND CORONARY ANGIOGRAPHY N/A 12/20/2018   Procedure: LEFT HEART CATH AND CORONARY ANGIOGRAPHY and possible intervention;  Surgeon: GaAdrian ProwsMD;  [facility];  Service:  Cardiovascular;  Laterality: N/A;   MASTOIDECTOMY Right 1970's   /nArchie Endo/07/2010   SHOULDER OPEN ROTATOR CUFF REPAIR Left 07/2010   SPHINCTEROTOMY     TONSILLECTOMY AND ADENOIDECTOMY      FAMILY HISTORY: The patient's family history includes Heart attack in his father; Kidney failure in his mother; Pancreatic cancer in his brother.   SOCIAL HISTORY:  The patient  reports that he quit smoking about 31 years ago. His smoking use included cigarettes. He has a 60.00 pack-year smoking history. He has never used smokeless tobacco. He reports current alcohol use. He reports that he does not use drugs.  Review of Systems  Constitutional: Positive for malaise/fatigue. Negative for chills and fever.  HENT:  Negative for hoarse voice and nosebleeds.   Eyes:  Negative for discharge, double vision and pain.  Cardiovascular:  Positive for dyspnea on exertion (chronic). Negative for chest pain, claudication, leg swelling, near-syncope, orthopnea, palpitations, paroxysmal nocturnal dyspnea and syncope.  Respiratory:  Negative for hemoptysis and shortness of breath.   Musculoskeletal:  Negative for muscle cramps and myalgias.  Gastrointestinal:  Negative for abdominal pain, constipation, diarrhea, hematemesis, hematochezia, melena, nausea and vomiting.  Neurological:  Negative for dizziness, light-headedness and vertigo.    PHYSICAL EXAM:    03/21/2022    1:54 PM 09/13/2021   12:02 PM 09/13/2021   11:38 AM  Vitals with BMI  Height 5' 10.5"    Weight 260 lbs 10 oz    BMI 35.36    Systolic 144 315 81  Diastolic 82 57 44  Pulse 85 92 91   Physical Exam Cardiovascular:     Rate and Rhythm: Normal rate and regular rhythm.     Pulses:          Carotid pulses are 2+ on the right side and 2+ on the left side.      Radial pulses are 2+ on the right side and 2+ on the left side.       Dorsalis pedis pulses are 2+ on the right side and 2+ on the left side.       Posterior tibial pulses are 1+ on the  right side and 1+ on the left side.     Heart sounds: Normal heart sounds. No murmur heard.    No gallop.  Pulmonary:     Effort: Pulmonary effort is normal.     Breath sounds: Normal breath sounds. No wheezing or rales.  Musculoskeletal:     Right lower leg: No edema.     Left lower leg: No edema.  Neurological:     Mental Status: He is alert.    CARDIAC DATABASE: EKG:  EKG 03/21/2022: Normal sinus rhythm with first-degree AV block at rate of 80 bpm.  Right axis deviation.  Poor R wave progression.  Nonspecific T wave abnormality.  Compared to previous EKG on 09/13/2021, no significant change  Echocardiogram: 12/09/2019: Mildly depressed LV systolic function with visual EF 45-50%. Left ventricle cavity is normal in size. Mild left ventricular hypertrophy. Normal global wall motion. Indeterminate diastolic filling pattern, elevated LAP.  Mild tricuspid regurgitation. No evidence of pulmonary hypertension. No other significant valvular abnormalities. Compared to previous study 12/19/2018 no significant change.   Stress Testing:  Lexiscan/modified Bruce Tetrofosmin stress test 11/23/2019: Lexiscan/modified Bruce nuclear stress test performed using 1-day protocol. Stress EKG is non-diagnostic, as this is pharmacological stress test. In addition, stress EKG at 71% MPHR showed sinus rhythm, possible old anteroseptal infarct, poor R wave progression, low voltage, no ischemic changes.   Large areas of soft tissue attenuation seen, particularly in vertical long axis images. In addition, Apical thinning seen on both rest and stress images may be physiological. Overall, no reversible ischemia noted. Stress LVEF 49%, although visually appears normal.  Low risk study.   Heart Catheterization: 12/20/2018: Left ventricle is borderline dilated.  Mild decrease in LV systolic function with global hypokinesis, EF 45%.  Normal LVEDP. Minimal luminal irregularity, no coronary artery disease.  Right dominant  circulation.   LABORATORY DATA:    Latest Ref Rng & Units 11/23/2019    2:35 PM 12/16/2018   10:36 PM 11/05/2016    4:53 AM  CBC  WBC 4.0 - 10.5 K/uL 5.5  8.2  10.6   Hemoglobin 13.0 - 17.0 g/dL 14.9  14.1  11.8   Hematocrit  39.0 - 52.0 % 44.0  40.4  36.0   Platelets 150 - 400 K/uL 167  194  224    External Labs: Collected: 06/07/2021 provided by PCP. BUN 11, creatinine 0.9. eGFR 90. Sodium 141, potassium 4, chloride 103, bicarb 27 AST 23, ALT 27, alkaline phosphatase 113 Hemoglobin 16.9 g/dL, hematocrit 50.3% BNP 8.2 Total cholesterol 173, triglycerides 259, HDL 42, LDL 88   IMPRESSION:    ICD-10-CM   1. Chronic HFrEF (heart failure with reduced ejection fraction) (HCC)  I50.22 EKG 82-UMPN    Basic Metabolic Panel (BMET)    PCV ECHOCARDIOGRAM COMPLETE    Magnesium    DISCONTINUED: sacubitril-valsartan (ENTRESTO) 49-51 mg per tablet    2. Primary hypertension  I10     3. Paroxysmal atrial fibrillation (HCC)  I48.0     4. Long term (current) use of anticoagulants  Z79.01     5. Type 2 diabetes mellitus without complication, with long-term current use of insulin (HCC)  E11.9    Z79.4     6. Pure hypercholesterolemia  E78.00 Lipid Panel With LDL/HDL Ratio    Hepatic function panel    7. Hypertriglyceridemia  E78.1     8. Statin intolerance  Z78.9     9. OSA treated with BiPAP  G47.33         RECOMMENDATIONS: Joshua Lara is a 76 y.o. male whose past medical history and cardiovascular risk factors include: Paroxysmal atrial fibrillation, oral anticoagulation, long-term antiarrhythmic medication, heart failure with reduced EF, stage C, NYHA class II, hypertension, hyperlipidemia, OSA on CPAP, diabetes mellitus, obesity due to excess calories, advanced age.  Chronic HFrEF (heart failure with reduced ejection fraction) (HCC) Stage C NYHA class II. Euvolemic. Soft blood pressures likely secondary to medications.  Restart Entresto to 49/51 mg p.o. twice daily.  A  new prescription for Joshua Loll has been sent to his pharmacy. Will schedule for echocardiogram since this was done 2 years ago. Check labs to reevaluate kidney function and electrolytes.  Paroxysmal atrial fibrillation (HCC) Rate control: Sotalol. Rhythm control Sotalol. Thromboembolic prophylaxis Eliquis. CHA2DS2-VASc SCORE is 4 which correlates to 4.0% risk of stroke per year (age, CHF, HTN, DM,).  EKG today re- illustrated sinus rhythm.  Long term (current) use of anticoagulants /antiarrhythmic drugs Indication paroxysmal atrial fibrillation. Does not endorse evidence of bleeding.  Type 2 diabetes mellitus without complication, with long-term current use of insulin (HCC) With complications of CHF, glaucoma Currently on Entresto, Jardiance, insulin, Repatha, fenofibrate Currently managed per primary team.  Primary hypertension Blood pressure is well controlled. Medication changes discussed above.  Pure hypercholesterolemia / Statin intolerance Has been intolerant to statin therapy in the past due to myalgias. The last medication tried with Livalo which is similar side effect profile. Given the fact that he is a diabetic, LDLs are not well controlled, and other cardiovascular risk factors he was recommended to be on Repatha.  He is tolerating the medication well. Will check lipid panel and LFTs.  Hypertriglyceridemia Currently on fenofibrate. We will continue to monitor.  OSA treated with BiPAP Remains compliant with BiPAP use.  FINAL MEDICATION LIST END OF ENCOUNTER: Meds ordered this encounter  Medications   DISCONTD: sacubitril-valsartan (ENTRESTO) 49-51 mg per tablet    Order Specific Question:   ACE-inhibitors have NOT been administered in the past 36-hours.    Answer:   YES (confirmed by ordering provider)   sacubitril-valsartan (ENTRESTO) 49-51 MG    Sig: Take 1 tablet by mouth 2 (two) times daily.  Dispense:  60 tablet    Refill:  3    Order Specific Question:    Supervising Provider    Answer:   Frederica Kuster     Current Outpatient Medications:    acetaminophen (TYLENOL) 650 MG CR tablet, Take 650 mg by mouth every 8 (eight) hours as needed for pain., Disp: , Rfl:    albuterol (VENTOLIN HFA) 108 (90 Base) MCG/ACT inhaler, Inhale into the lungs every 6 (six) hours as needed for wheezing or shortness of breath., Disp: , Rfl:    BD PEN NEEDLE NANO 2ND GEN 32G X 4 MM MISC, , Disp: , Rfl:    dorzolamide-timolol (COSOPT) 22.3-6.8 MG/ML ophthalmic solution, 1 drop 2 (two) times daily., Disp: , Rfl:    ELIQUIS 5 MG TABS tablet, TAKE 1 TABLET BY MOUTH EVERY 12 HOURS, Disp: 60 tablet, Rfl: 5   fenofibrate (TRICOR) 145 MG tablet, TAKE 1 TABLET BY MOUTH EVERY DAY, Disp: 90 tablet, Rfl: 0   fluticasone (FLONASE) 50 MCG/ACT nasal spray, Place into both nostrils daily., Disp: , Rfl:    folic acid (FOLVITE) 1 MG tablet, Take 1 mg by mouth daily., Disp: , Rfl:    HUMULIN R U-500 KWIKPEN 500 UNIT/ML kwikpen, Inject 125-150 Units into the skin 2 (two) times daily. , Disp: , Rfl:    JARDIANCE 10 MG TABS tablet, Take 10 mg by mouth daily., Disp: , Rfl:    latanoprost (XALATAN) 0.005 % ophthalmic solution, 1 drop at bedtime., Disp: , Rfl:    levothyroxine (SYNTHROID, LEVOTHROID) 100 MCG tablet, Take 100 mcg by mouth daily., Disp: , Rfl:    methotrexate 2.5 MG tablet, Take 5 mg by mouth once a week. 68m in Morning, 569mat night, Disp: , Rfl:    nitroGLYCERIN (NITROSTAT) 0.4 MG SL tablet, Place 1 tablet (0.4 mg total) under the tongue every 5 (five) minutes as needed for chest pain. If you require more than two tablets five minutes apart go to the nearest ER via EMS., Disp: 30 tablet, Rfl: 0   pantoprazole (PROTONIX) 40 MG tablet, TAKE 1 TABLET BY MOUTH TWICE A DAY, Disp: 180 tablet, Rfl: 0   REPATHA SURECLICK 14762G/ML SOAJ, INJECT 140 MG INTO THE SKIN EVERY 14 (FOURTEEN) DAYS, Disp: 2 mL, Rfl: 3   sacubitril-valsartan (ENTRESTO) 49-51 MG, Take 1 tablet by mouth 2 (two)  times daily., Disp: 60 tablet, Rfl: 3   sotalol (BETAPACE) 80 MG tablet, TAKE 0.5 TABLETS BY MOUTH EVERY MORNING AND TAKE 1 TABLET IN THE EVENING (Patient taking differently: Take 40 mg by mouth 2 (two) times daily.), Disp: 135 tablet, Rfl: 2   tamsulosin (FLOMAX) 0.4 MG CAPS capsule, SMARTSIG:1 Capsule(s) By Mouth Every Evening, Disp: , Rfl:    TRULICITY 0.8.31GDV/7.6HYOPN, INJECT 0.75 MG ONCE A WEEK SUBCUTANEOUS 30 DAYS, Disp: , Rfl:    sildenafil (VIAGRA) 100 MG tablet, Take 100 mg by mouth every 4 (four) hours as needed. (Patient not taking: Reported on 03/21/2022), Disp: , Rfl:   Orders Placed This Encounter  Procedures   Basic Metabolic Panel (BMET)   Lipid Panel With LDL/HDL Ratio   Magnesium   Hepatic function panel   EKG 12-Lead   PCV ECHOCARDIOGRAM COMPLETE   --Continue cardiac medications as reconciled in final medication list. --Return in about 6 months (around 09/19/2022) for HTN, CHF, HLD, OSA. Or sooner if needed. --Continue follow-up with your primary care physician regarding the management of your other chronic comorbid conditions.  Patient's questions and  concerns were addressed to his satisfaction. He voices understanding of the instructions provided during this encounter.   This note was created using a voice recognition software as a result there may be grammatical errors inadvertently enclosed that do not reflect the nature of this encounter. Every attempt is made to correct such errors.    Mick Sell, [place] Pager: [phone] Office: [phone]

## 2022-03-24 DIAGNOSIS — I5030 Unspecified diastolic (congestive) heart failure: Secondary | ICD-10-CM | POA: Diagnosis not present

## 2022-03-24 DIAGNOSIS — E7800 Pure hypercholesterolemia, unspecified: Secondary | ICD-10-CM | POA: Diagnosis not present

## 2022-03-24 DIAGNOSIS — G4733 Obstructive sleep apnea (adult) (pediatric): Secondary | ICD-10-CM | POA: Diagnosis not present

## 2022-03-28 ENCOUNTER — Other Ambulatory Visit: Payer: Self-pay

## 2022-03-28 DIAGNOSIS — L300 Nummular dermatitis: Secondary | ICD-10-CM | POA: Diagnosis not present

## 2022-04-11 ENCOUNTER — Ambulatory Visit: Payer: PPO

## 2022-04-11 DIAGNOSIS — I5022 Chronic systolic (congestive) heart failure: Secondary | ICD-10-CM | POA: Diagnosis not present

## 2022-04-14 NOTE — Progress Notes (Signed)
Spoke with patient about test results. Patient acknowledged understanding and had no further questions regarding test results.

## 2022-04-17 ENCOUNTER — Other Ambulatory Visit: Payer: Self-pay | Admitting: Cardiology

## 2022-04-17 DIAGNOSIS — Z789 Other specified health status: Secondary | ICD-10-CM

## 2022-04-17 DIAGNOSIS — E7800 Pure hypercholesterolemia, unspecified: Secondary | ICD-10-CM

## 2022-04-17 DIAGNOSIS — I5022 Chronic systolic (congestive) heart failure: Secondary | ICD-10-CM

## 2022-04-17 DIAGNOSIS — Z794 Long term (current) use of insulin: Secondary | ICD-10-CM

## 2022-04-21 ENCOUNTER — Other Ambulatory Visit: Payer: Self-pay | Admitting: Cardiology

## 2022-04-21 DIAGNOSIS — E781 Pure hyperglyceridemia: Secondary | ICD-10-CM

## 2022-05-08 DIAGNOSIS — G4733 Obstructive sleep apnea (adult) (pediatric): Secondary | ICD-10-CM | POA: Diagnosis not present

## 2022-05-23 DIAGNOSIS — D485 Neoplasm of uncertain behavior of skin: Secondary | ICD-10-CM | POA: Diagnosis not present

## 2022-05-23 DIAGNOSIS — L821 Other seborrheic keratosis: Secondary | ICD-10-CM | POA: Diagnosis not present

## 2022-05-23 DIAGNOSIS — L299 Pruritus, unspecified: Secondary | ICD-10-CM | POA: Diagnosis not present

## 2022-05-23 DIAGNOSIS — L920 Granuloma annulare: Secondary | ICD-10-CM | POA: Diagnosis not present

## 2022-05-23 DIAGNOSIS — L300 Nummular dermatitis: Secondary | ICD-10-CM | POA: Diagnosis not present

## 2022-05-30 DIAGNOSIS — R051 Acute cough: Secondary | ICD-10-CM | POA: Diagnosis not present

## 2022-05-30 DIAGNOSIS — R0600 Dyspnea, unspecified: Secondary | ICD-10-CM | POA: Diagnosis not present

## 2022-05-30 DIAGNOSIS — G4733 Obstructive sleep apnea (adult) (pediatric): Secondary | ICD-10-CM | POA: Diagnosis not present

## 2022-05-30 DIAGNOSIS — R5383 Other fatigue: Secondary | ICD-10-CM | POA: Diagnosis not present

## 2022-06-06 DIAGNOSIS — L299 Pruritus, unspecified: Secondary | ICD-10-CM | POA: Diagnosis not present

## 2022-06-06 DIAGNOSIS — L920 Granuloma annulare: Secondary | ICD-10-CM | POA: Diagnosis not present

## 2022-06-09 DIAGNOSIS — R0600 Dyspnea, unspecified: Secondary | ICD-10-CM | POA: Diagnosis not present

## 2022-06-09 DIAGNOSIS — J019 Acute sinusitis, unspecified: Secondary | ICD-10-CM | POA: Diagnosis not present

## 2022-06-09 DIAGNOSIS — R5383 Other fatigue: Secondary | ICD-10-CM | POA: Diagnosis not present

## 2022-06-09 DIAGNOSIS — R051 Acute cough: Secondary | ICD-10-CM | POA: Diagnosis not present

## 2022-06-09 DIAGNOSIS — G4733 Obstructive sleep apnea (adult) (pediatric): Secondary | ICD-10-CM | POA: Diagnosis not present

## 2022-06-12 ENCOUNTER — Other Ambulatory Visit: Payer: Self-pay | Admitting: Cardiology

## 2022-06-12 DIAGNOSIS — E119 Type 2 diabetes mellitus without complications: Secondary | ICD-10-CM

## 2022-06-12 DIAGNOSIS — E7800 Pure hypercholesterolemia, unspecified: Secondary | ICD-10-CM

## 2022-06-12 DIAGNOSIS — I5022 Chronic systolic (congestive) heart failure: Secondary | ICD-10-CM

## 2022-06-12 DIAGNOSIS — Z789 Other specified health status: Secondary | ICD-10-CM

## 2022-06-13 DIAGNOSIS — H401123 Primary open-angle glaucoma, left eye, severe stage: Secondary | ICD-10-CM | POA: Diagnosis not present

## 2022-06-16 ENCOUNTER — Emergency Department (HOSPITAL_COMMUNITY)
Admission: EM | Admit: 2022-06-16 | Discharge: 2022-06-16 | Disposition: A | Discharge status: Home / Self Care | Payer: PPO | Attending: Student | Admitting: Student

## 2022-06-16 ENCOUNTER — Encounter (HOSPITAL_COMMUNITY): Payer: Self-pay | Admitting: Emergency Medicine

## 2022-06-16 ENCOUNTER — Other Ambulatory Visit: Payer: Self-pay

## 2022-06-16 ENCOUNTER — Emergency Department (HOSPITAL_COMMUNITY): Payer: PPO

## 2022-06-16 DIAGNOSIS — E039 Hypothyroidism, unspecified: Secondary | ICD-10-CM | POA: Insufficient documentation

## 2022-06-16 DIAGNOSIS — Z79899 Other long term (current) drug therapy: Secondary | ICD-10-CM | POA: Insufficient documentation

## 2022-06-16 DIAGNOSIS — Z794 Long term (current) use of insulin: Secondary | ICD-10-CM | POA: Diagnosis not present

## 2022-06-16 DIAGNOSIS — I110 Hypertensive heart disease with heart failure: Secondary | ICD-10-CM | POA: Insufficient documentation

## 2022-06-16 DIAGNOSIS — M25519 Pain in unspecified shoulder: Secondary | ICD-10-CM | POA: Diagnosis not present

## 2022-06-16 DIAGNOSIS — R1013 Epigastric pain: Secondary | ICD-10-CM

## 2022-06-16 DIAGNOSIS — Z87891 Personal history of nicotine dependence: Secondary | ICD-10-CM | POA: Diagnosis not present

## 2022-06-16 DIAGNOSIS — R079 Chest pain, unspecified: Secondary | ICD-10-CM | POA: Insufficient documentation

## 2022-06-16 DIAGNOSIS — E119 Type 2 diabetes mellitus without complications: Secondary | ICD-10-CM | POA: Insufficient documentation

## 2022-06-16 DIAGNOSIS — R142 Eructation: Secondary | ICD-10-CM | POA: Diagnosis not present

## 2022-06-16 DIAGNOSIS — I482 Chronic atrial fibrillation, unspecified: Secondary | ICD-10-CM | POA: Diagnosis not present

## 2022-06-16 DIAGNOSIS — R918 Other nonspecific abnormal finding of lung field: Secondary | ICD-10-CM | POA: Diagnosis not present

## 2022-06-16 DIAGNOSIS — I2510 Atherosclerotic heart disease of native coronary artery without angina pectoris: Secondary | ICD-10-CM | POA: Insufficient documentation

## 2022-06-16 DIAGNOSIS — Z7901 Long term (current) use of anticoagulants: Secondary | ICD-10-CM | POA: Diagnosis not present

## 2022-06-16 DIAGNOSIS — I509 Heart failure, unspecified: Secondary | ICD-10-CM | POA: Insufficient documentation

## 2022-06-16 DIAGNOSIS — Z79890 Hormone replacement therapy (postmenopausal): Secondary | ICD-10-CM | POA: Insufficient documentation

## 2022-06-16 DIAGNOSIS — Z20822 Contact with and (suspected) exposure to covid-19: Secondary | ICD-10-CM | POA: Diagnosis not present

## 2022-06-16 DIAGNOSIS — M542 Cervicalgia: Secondary | ICD-10-CM | POA: Diagnosis not present

## 2022-06-16 DIAGNOSIS — S2242XA Multiple fractures of ribs, left side, initial encounter for closed fracture: Secondary | ICD-10-CM | POA: Diagnosis not present

## 2022-06-16 DIAGNOSIS — Z7984 Long term (current) use of oral hypoglycemic drugs: Secondary | ICD-10-CM | POA: Insufficient documentation

## 2022-06-16 DIAGNOSIS — R109 Unspecified abdominal pain: Secondary | ICD-10-CM | POA: Diagnosis not present

## 2022-06-16 LAB — CBC WITH DIFFERENTIAL/PLATELET
Abs Immature Granulocytes: 0.08 10*3/uL — ABNORMAL HIGH (ref 0.00–0.07)
Basophils Absolute: 0.1 10*3/uL (ref 0.0–0.1)
Basophils Relative: 1 %
Eosinophils Absolute: 0.4 10*3/uL (ref 0.0–0.5)
Eosinophils Relative: 6 %
HCT: 41.5 % (ref 39.0–52.0)
Hemoglobin: 14.8 g/dL (ref 13.0–17.0)
Immature Granulocytes: 1 %
Lymphocytes Relative: 17 %
Lymphs Abs: 1.2 10*3/uL (ref 0.7–4.0)
MCH: 31.8 pg (ref 26.0–34.0)
MCHC: 35.7 g/dL (ref 30.0–36.0)
MCV: 89.2 fL (ref 80.0–100.0)
Monocytes Absolute: 0.8 10*3/uL (ref 0.1–1.0)
Monocytes Relative: 12 %
Neutro Abs: 4.7 10*3/uL (ref 1.7–7.7)
Neutrophils Relative %: 63 %
Platelets: 183 10*3/uL (ref 150–400)
RBC: 4.65 MIL/uL (ref 4.22–5.81)
RDW: 15.3 % (ref 11.5–15.5)
WBC: 7.3 10*3/uL (ref 4.0–10.5)
nRBC: 0 % (ref 0.0–0.2)

## 2022-06-16 LAB — COMPREHENSIVE METABOLIC PANEL
ALT: 26 U/L (ref 0–44)
AST: 24 U/L (ref 15–41)
Albumin: 3.7 g/dL (ref 3.5–5.0)
Alkaline Phosphatase: 67 U/L (ref 38–126)
Anion gap: 9 (ref 5–15)
BUN: 17 mg/dL (ref 8–23)
CO2: 25 mmol/L (ref 22–32)
Calcium: 10.3 mg/dL (ref 8.9–10.3)
Chloride: 102 mmol/L (ref 98–111)
Creatinine, Ser: 1 mg/dL (ref 0.61–1.24)
GFR, Estimated: >60 mL/min (ref >60)
Glucose, Bld: 138 mg/dL — ABNORMAL HIGH (ref 70–99)
Potassium: 3.7 mmol/L (ref 3.5–5.1)
Sodium: 136 mmol/L (ref 135–145)
Total Bilirubin: 0.8 mg/dL (ref 0.3–1.2)
Total Protein: 7.1 g/dL (ref 6.5–8.1)

## 2022-06-16 LAB — TROPONIN I (HIGH SENSITIVITY)
Troponin I (High Sensitivity): 5 ng/L (ref <18)
Troponin I (High Sensitivity): 4 ng/L (ref <18)

## 2022-06-16 LAB — BRAIN NATRIURETIC PEPTIDE: B Natriuretic Peptide: 19.8 pg/mL (ref 0.0–100.0)

## 2022-06-16 MED ORDER — LIDOCAINE VISCOUS HCL 2 % MT SOLN
15.0000 mL | Freq: Once | OROMUCOSAL | Status: AC
  Administered 2022-06-16: 15 mL via ORAL
  Filled 2022-06-16: qty 15

## 2022-06-16 MED ORDER — ALUM & MAG HYDROXIDE-SIMETH 200-200-20 MG/5ML PO SUSP
30.0000 mL | Freq: Once | ORAL | Status: AC
  Administered 2022-06-16: 30 mL via ORAL
  Filled 2022-06-16: qty 30

## 2022-06-16 NOTE — ED Triage Notes (Signed)
Patient arrived via [facility] from home with complaints of mid chest/epigastric pain associated with nausea, radiates to left neck/shoulder, onset around 1100 today. Patient rates pain 3/10 at this time. Patient states he went to urgent care today and he was told his ECG was abnormal that he needs to come to the ED. EMS given '324mg'$  aspirin. Per EMS CBG-173, BP-124/palpated.

## 2022-06-17 ENCOUNTER — Other Ambulatory Visit: Payer: Self-pay | Admitting: Cardiology

## 2022-06-17 DIAGNOSIS — I480 Paroxysmal atrial fibrillation: Secondary | ICD-10-CM

## 2022-06-17 DIAGNOSIS — I5033 Acute on chronic diastolic (congestive) heart failure: Secondary | ICD-10-CM

## 2022-06-17 LAB — LIPASE, BLOOD: Lipase: 48 U/L (ref 11–51)

## 2022-06-17 NOTE — ED Provider Notes (Signed)
[facility] Provider Note  CSN: [id number] Arrival date & time: 06/16/22 1743  Chief Complaint(s) Chest Pain  HPI Joshua Lara is a 77 y.o. male with PMH CHF, hypertension, HLD, OSA on BiPAP nightly, T2DM, GERD, A-fib on Eliquis who presents emergency department for evaluation of chest and epigastric pain.  Patient states that pain began around 11 AM today worse in the epigastrium that radiates up into the chest and into the left neck and shoulder.  He states that he went to an urgent care today who tested him for COVID and flu and ultimately discharged him home, but the urgent care physician reviewed his ECG and called the patient back stating that he saw a "anomaly" and told him to urgently present to the emergency department for further evaluation.  Here Emergency Department, patient rates the pain as a 3 out of 10 and states that it is primarily associated with abdominal gas and belching.  He denies current nausea, vomiting, headache, fever or other systemic symptoms.  He states that he does have a history of GERD and was recently prescribed omeprazole today.   Past Medical History Past Medical History:  Diagnosis Date   Arthritis    "hands, back, ankles" (05/11/2014)   CHF (congestive heart failure) (HCC)    Chronic lower back pain    Compression fracture of lumbar vertebra (HCC)    Coronary artery disease    Daily headache    "recently" (05/11/2014)   Depression    "I might be slight" (05/11/2014)   GERD (gastroesophageal reflux disease)    High cholesterol    Hypertension    Hypothyroid    OSA treated with BiPAP    Pneumonia    "couple times; last time was 03/2011" (05/11/2014)   Type II diabetes mellitus [facility])    Patient Active Problem List   Diagnosis Date Noted   Atrial fibrillation with rapid ventricular response ([place]) 12/17/2018   Paroxysmal atrial fibrillation ([place]) 12/17/2018   Chronic midline low back pain without  sciatica 08/23/2018   Angioedema 11/04/2016   Severe tongue swelling    Acute on chronic diastolic heart failure ([place]) 05/15/2014   Dyspnea 05/10/2014   Acute respiratory failure ([place]) 05/10/2014   OSA on CPAP 05/10/2014   Abnormal CT scan, chest 05/10/2014   DOE (dyspnea on exertion) 04/05/2011   PNA (pneumonia) 04/05/2011   DM (diabetes mellitus) ([place]) 04/05/2011   HTN (hypertension) 04/05/2011   Tachycardia 04/05/2011   Chest pain 04/05/2011   Fever 04/05/2011   Leukocytosis 04/05/2011   Hypothyroidism 04/05/2011   GERD (gastroesophageal reflux disease) 04/05/2011   RA (rheumatoid arthritis) ([place]) 04/05/2011   Obesity 04/05/2011   CAD in native artery 04/05/2011   Hypokalemia 04/05/2011   Home Medication(s) Prior to Admission medications   Medication Sig Start Date End Date Taking? Authorizing Provider  acetaminophen (TYLENOL) 650 MG CR tablet Take 650 mg by mouth every 8 (eight) hours as needed for pain.    [provider]  albuterol (VENTOLIN HFA) 108 (90 Base) MCG/ACT inhaler Inhale into the lungs every 6 (six) hours as needed for wheezing or shortness of breath.    [provider]  BD PEN NEEDLE NANO 2ND GEN 32G X 4 MM MISC  11/22/19   [provider]  dorzolamide-timolol (COSOPT) 22.3-6.8 MG/ML ophthalmic solution 1 drop 2 (two) times daily.    [provider]  ELIQUIS 5 MG TABS tablet TAKE 1 TABLET BY MOUTH EVERY 12 HOURS  12/05/21   Tolia, Sunit, DO  Evolocumab (REPATHA SURECLICK) 510 MG/ML SOAJ INJECT 140 MG INTO THE SKIN EVERY 14 (FOURTEEN) DAYS 06/13/22   Custovic, Collene Mares, DO  fenofibrate (TRICOR) 145 MG tablet TAKE 1 TABLET BY MOUTH EVERY DAY 04/22/22   Tolia, Sunit, DO  fluticasone (FLONASE) 50 MCG/ACT nasal spray Place into both nostrils daily.    [provider]  folic acid (FOLVITE) 1 MG tablet Take 1 mg by mouth daily. 09/05/19   [provider]  HUMULIN R U-500 KWIKPEN 500 UNIT/ML kwikpen Inject 125-150 Units into  the skin 2 (two) times daily.  04/12/19   [provider]  JARDIANCE 10 MG TABS tablet Take 10 mg by mouth daily. 10/03/19   [provider]  latanoprost (XALATAN) 0.005 % ophthalmic solution 1 drop at bedtime.    [provider]  levothyroxine (SYNTHROID, LEVOTHROID) 100 MCG tablet Take 100 mcg by mouth daily.    [provider]  methotrexate 2.5 MG tablet Take 5 mg by mouth once a week. '5mg'$  in Morning, '5mg'$  at night    [provider]  nitroGLYCERIN (NITROSTAT) 0.4 MG SL tablet Place 1 tablet (0.4 mg total) under the tongue every 5 (five) minutes as needed for chest pain. If you require more than two tablets five minutes apart go to the nearest ER via EMS. 11/23/20 03/21/22  Tolia, Sunit, DO  pantoprazole (PROTONIX) 40 MG tablet TAKE 1 TABLET BY MOUTH TWICE A DAY 05/07/20   Tolia, Sunit, DO  sacubitril-valsartan (ENTRESTO) 49-51 MG Take 1 tablet by mouth 2 (two) times daily. 03/21/22   Ernst Spell, NP  sildenafil (VIAGRA) 100 MG tablet Take 100 mg by mouth every 4 (four) hours as needed. Patient not taking: Reported on 03/21/2022 12/30/19   [provider]  sotalol (BETAPACE) 80 MG tablet TAKE 0.5 TABLETS BY MOUTH EVERY MORNING AND TAKE 1 TABLET IN THE EVENING Patient taking differently: Take 40 mg by mouth 2 (two) times daily. 05/29/21   Tolia, Sunit, DO  tamsulosin (FLOMAX) 0.4 MG CAPS capsule SMARTSIG:1 Capsule(s) By Mouth Every Evening 07/18/21   [provider]  TRULICITY 2.58 NI/7.7OE SOPN INJECT 0.75 MG ONCE A WEEK SUBCUTANEOUS 30 DAYS 09/11/19   [provider]                                                                                                                                    Past Surgical History Past Surgical History:  Procedure Laterality Date   APPENDECTOMY     CARDIAC CATHETERIZATION  2001; 2009   CATARACT EXTRACTION W/ INTRAOCULAR LENS IMPLANT Left    CHOLECYSTECTOMY  1980's   Archie Endo 07/13/2010     EYE MUSCLE SURGERY Left    LEFT AND RIGHT HEART CATHETERIZATION WITH CORONARY ANGIOGRAM N/A 05/30/2014   Procedure: LEFT AND RIGHT HEART CATHETERIZATION WITH CORONARY ANGIOGRAM;  Surgeon: Jolaine Artist, MD;  [facility] CATH LAB;  Service: Cardiovascular;  Laterality: N/A;   LEFT HEART CATH AND CORONARY ANGIOGRAPHY N/A 12/20/2018   Procedure: LEFT HEART CATH AND CORONARY ANGIOGRAPHY and possible intervention;  Surgeon: Adrian Prows, MD;  Location: [facility] CV LAB;  Service: Cardiovascular;  Laterality: N/A;   MASTOIDECTOMY Right 1970's   Archie Endo 07/13/2010   SHOULDER OPEN ROTATOR CUFF REPAIR Left 07/2010   SPHINCTEROTOMY     TONSILLECTOMY AND ADENOIDECTOMY     Family History Family History  Problem Relation Age of Onset   Kidney failure Mother    Heart attack Father    Pancreatic cancer Brother     Social History Social History   Tobacco Use   Smoking status: Former    Packs/day: 2.00    Years: 30.00    Total pack years: 60.00    Types: Cigarettes    Quit date: 05/12/1990    Years since quitting: 32.1   Smokeless tobacco: Never  Vaping Use   Vaping Use: Never used  Substance Use Topics   Alcohol use: Yes    Alcohol/week: 0.0 standard drinks of alcohol    Comment: 05/11/2014 "an occasional beer when out w/people; not often"   Drug use: No   Allergies Bactrim [sulfamethoxazole-trimethoprim], Ranitidine, Ranitidine hcl, Adalimumab, Etanercept, Etanercept, Sulfa antibiotics, Sulfur, and Tape  Review of Systems Review of Systems  Cardiovascular:  Positive for chest pain.  Gastrointestinal:  Positive for abdominal pain.    Physical Exam Vital Signs  I have reviewed the triage vital signs BP 116/67   Pulse 75   Temp 98.5 F (36.9 C) (Oral)   Resp 16   Ht '5\' 10"'$  (1.778 m)   Wt 118.8 kg   SpO2 96%   BMI 37.59 kg/m   Physical Exam Vitals and nursing note reviewed.  Constitutional:      General: He is not in acute distress.    Appearance: Normal appearance. He  is well-developed.  HENT:     Head: Normocephalic and atraumatic.     Nose: No congestion or rhinorrhea.  Eyes:     General:        Right eye: No discharge.        Left eye: No discharge.     Extraocular Movements: Extraocular movements intact.     Conjunctiva/sclera: Conjunctivae normal.     Pupils: Pupils are equal, round, and reactive to light.  Cardiovascular:     Rate and Rhythm: Normal rate and regular rhythm.     Heart sounds: No murmur heard. Pulmonary:     Effort: Pulmonary effort is normal. No respiratory distress.     Breath sounds: No wheezing or rales.  Abdominal:     General: There is no distension.     Tenderness: There is no abdominal tenderness.  Musculoskeletal:        General: No swelling. Normal range of motion.     Cervical back: Normal range of motion and neck supple.  Skin:    General: Skin is warm and dry.  Neurological:     General: No focal deficit present.     Mental Status: He is alert.  Psychiatric:        Mood and Affect: Mood normal.     ED Results and Treatments Labs (all labs ordered are listed, but only abnormal results are displayed) Labs Reviewed  COMPREHENSIVE METABOLIC PANEL - Abnormal; Notable for the following components:      Result Value   Glucose, Bld 138 (*)    All other components within  normal limits  CBC WITH DIFFERENTIAL/PLATELET - Abnormal; Notable for the following components:   Abs Immature Granulocytes 0.08 (*)    All other components within normal limits  BRAIN NATRIURETIC PEPTIDE  LIPASE, BLOOD  TROPONIN I (HIGH SENSITIVITY)  TROPONIN I (HIGH SENSITIVITY)                                                                                                                          Radiology DG Chest 2 View  Result Date: 06/16/2022 CLINICAL DATA:  Midline chest EXAM: CHEST - 2 VIEW COMPARISON:  Chest radiograph dated 06/08/2020 FINDINGS: Normal lung volumes. Left retrocardiac linear opacities. No pleural effusion or  pneumothorax. The heart size and mediastinal contours are within normal limits. Multiple old left-sided rib fractures. IMPRESSION: Left retrocardiac linear opacities, likely atelectasis. Electronically Signed   By: Darrin Nipper M.D.   On: 06/16/2022 19:21    Pertinent labs & imaging results that were available during my care of the patient were reviewed by me and considered in my medical decision making (see MDM for details).  Medications Ordered in ED Medications  alum & mag hydroxide-simeth (MAALOX/MYLANTA) 200-200-20 MG/5ML suspension 30 mL (30 mLs Oral Given 06/16/22 1829)    And  lidocaine (XYLOCAINE) 2 % viscous mouth solution 15 mL (15 mLs Oral Given 06/16/22 1829)                                                                                                                                     Procedures Procedures  (including critical care time)  Medical Decision Making / ED Course   This patient presents to the ED for concern of chest and abdominal pain, this involves an extensive number of treatment options, and is a complaint that carries with it a high risk of complications and morbidity.  The differential diagnosis includes gastritis, ACS, pneumonia, CHF exacerbation, COPD exacerbation, intestinal gas, bloating, pancreatitis, cholecystitis  MDM: Patient seen emerged part for evaluation of chest and abdominal pain.  Physical exam with tenderness in the epigastrium but is otherwise unremarkable.  Pulmonary exam unremarkable.  Laboratory evaluation is reassuring with normal high-sensitivity troponin and delta troponin.  BNP is normal and obtained in the setting of chest pain.  Chest x-ray with retrocardiac atelectasis but is otherwise unremarkable.  ECG reviewed and I do not see the reported anomaly described at urgent care.  There is no  ST elevation or depression and looks similar to previous ECGs.  Patient was given a GI cocktail and on reevaluation his symptoms have significantly  improved.  Patient has a heart score 4 today and I thus encouraged him to call his cardiologist Dr. Terri Skains for follow-up, and gave him very strict return precautions of which he voiced understanding.  Patient is currently chest pain-free in the emergency department and at this time, using shared decision-making, we decided for discharge today with outpatient follow-up and return precautions.   Additional history obtained: -Additional history obtained from son -External records from outside source obtained and reviewed including: Chart review including previous notes, labs, imaging, consultation notes   Lab Tests: -I ordered, reviewed, and interpreted labs.   The pertinent results include:   Labs Reviewed  COMPREHENSIVE METABOLIC PANEL - Abnormal; Notable for the following components:      Result Value   Glucose, Bld 138 (*)    All other components within normal limits  CBC WITH DIFFERENTIAL/PLATELET - Abnormal; Notable for the following components:   Abs Immature Granulocytes 0.08 (*)    All other components within normal limits  BRAIN NATRIURETIC PEPTIDE  LIPASE, BLOOD  TROPONIN I (HIGH SENSITIVITY)  TROPONIN I (HIGH SENSITIVITY)      EKG   EKG Interpretation  Date/Time:  Monday June 16 2022 17:56:09 EST Ventricular Rate:  68 PR Interval:  216 QRS Duration: 102 QT Interval:  420 QTC Calculation: 447 R Axis:   116 Text Interpretation: Sinus rhythm Confirmed by [place] ([id number]) on 06/16/2022 6:00:41 PM         Imaging Studies ordered: I ordered imaging studies including chest x-ray I independently visualized and interpreted imaging. I agree with the radiologist interpretation   Medicines ordered and prescription drug management: Meds ordered this encounter  Medications   AND Linked Order Group    alum & mag hydroxide-simeth (MAALOX/MYLANTA) 200-200-20 MG/5ML suspension 30 mL    lidocaine (XYLOCAINE) 2 % viscous mouth solution 15 mL    -I have reviewed the  patients home medicines and have made adjustments as needed  Critical interventions none  Cardiac Monitoring: The patient was maintained on a cardiac monitor.  I personally viewed and interpreted the cardiac monitored which showed an underlying rhythm of: NSR  Social Determinants of Health:  Factors impacting patients care include: none   Reevaluation: After the interventions noted above, I reevaluated the patient and found that they have :improved  Co morbidities that complicate the patient evaluation  Past Medical History:  Diagnosis Date   Arthritis    "hands, back, ankles" (05/11/2014)   CHF (congestive heart failure) (HCC)    Chronic lower back pain    Compression fracture of lumbar vertebra (HCC)    Coronary artery disease    Daily headache    "recently" (05/11/2014)   Depression    "I might be slight" (05/11/2014)   GERD (gastroesophageal reflux disease)    High cholesterol    Hypertension    Hypothyroid    OSA treated with BiPAP    Pneumonia    "couple times; last time was 03/2011" (05/11/2014)   Type II diabetes mellitus ([place])       Dispostion: I considered admission for this patient, and with a heart score of 4, we have shared decision-making to opt for outpatient cardiology follow-up with return precautions.  I did offer the patient admission for observation but he would like to be discharged at this time which I do not think is  unreasonable.  Patient presentation appears highly consistent with gastritis and with overall reassuring workup in the ER this is not an unreasonable plan.     Final Clinical Impression(s) / ED Diagnoses Final diagnoses:  Epigastric pain  Burping     '@PCDICTATION'$ @    Teressa Lower, MD 06/17/22 1453

## 2022-06-18 NOTE — Telephone Encounter (Signed)
Can I refill sotalol

## 2022-06-19 NOTE — Telephone Encounter (Signed)
  Can we refill sotalol

## 2022-06-20 DIAGNOSIS — E1142 Type 2 diabetes mellitus with diabetic polyneuropathy: Secondary | ICD-10-CM | POA: Diagnosis not present

## 2022-06-20 DIAGNOSIS — E039 Hypothyroidism, unspecified: Secondary | ICD-10-CM | POA: Diagnosis not present

## 2022-06-20 DIAGNOSIS — Z794 Long term (current) use of insulin: Secondary | ICD-10-CM | POA: Diagnosis not present

## 2022-06-20 DIAGNOSIS — E1065 Type 1 diabetes mellitus with hyperglycemia: Secondary | ICD-10-CM | POA: Diagnosis not present

## 2022-06-20 DIAGNOSIS — E1136 Type 2 diabetes mellitus with diabetic cataract: Secondary | ICD-10-CM | POA: Diagnosis not present

## 2022-06-20 DIAGNOSIS — H409 Unspecified glaucoma: Secondary | ICD-10-CM | POA: Diagnosis not present

## 2022-06-24 DIAGNOSIS — R0600 Dyspnea, unspecified: Secondary | ICD-10-CM | POA: Diagnosis not present

## 2022-06-24 DIAGNOSIS — R918 Other nonspecific abnormal finding of lung field: Secondary | ICD-10-CM | POA: Diagnosis not present

## 2022-06-24 DIAGNOSIS — G4733 Obstructive sleep apnea (adult) (pediatric): Secondary | ICD-10-CM | POA: Diagnosis not present

## 2022-06-24 DIAGNOSIS — R5383 Other fatigue: Secondary | ICD-10-CM | POA: Diagnosis not present

## 2022-06-25 DIAGNOSIS — I2510 Atherosclerotic heart disease of native coronary artery without angina pectoris: Secondary | ICD-10-CM | POA: Diagnosis not present

## 2022-06-25 DIAGNOSIS — H6090 Unspecified otitis externa, unspecified ear: Secondary | ICD-10-CM | POA: Diagnosis not present

## 2022-06-25 DIAGNOSIS — H6120 Impacted cerumen, unspecified ear: Secondary | ICD-10-CM | POA: Diagnosis not present

## 2022-06-25 DIAGNOSIS — R079 Chest pain, unspecified: Secondary | ICD-10-CM | POA: Diagnosis not present

## 2022-06-25 DIAGNOSIS — K219 Gastro-esophageal reflux disease without esophagitis: Secondary | ICD-10-CM | POA: Diagnosis not present

## 2022-06-26 ENCOUNTER — Telehealth: Payer: Self-pay

## 2022-06-26 NOTE — Telephone Encounter (Signed)
        Patient  visited [facility] [place] on 06/16/2022  for chest pain.   Telephone encounter attempt :  1st  A HIPAA compliant voice message was left requesting a return call.  Instructed patient to call back at [phone].   [place] Resource Care Guide   ??[email or web address]  ?? [phone]   Website: [email or web address]  [email or web address]

## 2022-06-27 ENCOUNTER — Telehealth: Payer: Self-pay

## 2022-06-27 NOTE — Telephone Encounter (Signed)
        Patient  visited [facility] [place] on 06/16/2022  for chest pain.   Telephone encounter attempt :  2nd  A HIPAA compliant voice message was left requesting a return call.  Instructed patient to call back at [phone].   [place] Resource Care Guide   [email or web address]  ?? [phone]   Website: [email or web address]  [email or web address]

## 2022-07-01 ENCOUNTER — Telehealth: Payer: Self-pay

## 2022-07-01 NOTE — Telephone Encounter (Signed)
        Patient  visited [facility] [place] on 06/16/2022  for chest pain.   Telephone encounter attempt :  3rd  A HIPAA compliant voice message was left requesting a return call.  Instructed patient to call back at [phone].   [place] Resource Care Guide   [email or web address]  ?? [phone]   Website: [email or web address]

## 2022-07-04 DIAGNOSIS — R918 Other nonspecific abnormal finding of lung field: Secondary | ICD-10-CM | POA: Diagnosis not present

## 2022-07-04 DIAGNOSIS — J432 Centrilobular emphysema: Secondary | ICD-10-CM | POA: Diagnosis not present

## 2022-07-04 DIAGNOSIS — K7460 Unspecified cirrhosis of liver: Secondary | ICD-10-CM | POA: Diagnosis not present

## 2022-07-04 DIAGNOSIS — I2510 Atherosclerotic heart disease of native coronary artery without angina pectoris: Secondary | ICD-10-CM | POA: Diagnosis not present

## 2022-07-04 DIAGNOSIS — I7121 Aneurysm of the ascending aorta, without rupture: Secondary | ICD-10-CM | POA: Diagnosis not present

## 2022-07-04 DIAGNOSIS — I700 Atherosclerosis of aorta: Secondary | ICD-10-CM | POA: Diagnosis not present

## 2022-07-04 DIAGNOSIS — G4733 Obstructive sleep apnea (adult) (pediatric): Secondary | ICD-10-CM | POA: Diagnosis not present

## 2022-07-05 ENCOUNTER — Other Ambulatory Visit: Payer: Self-pay | Admitting: Internal Medicine

## 2022-07-05 DIAGNOSIS — I5022 Chronic systolic (congestive) heart failure: Secondary | ICD-10-CM

## 2022-07-05 DIAGNOSIS — E7800 Pure hypercholesterolemia, unspecified: Secondary | ICD-10-CM

## 2022-07-05 DIAGNOSIS — E119 Type 2 diabetes mellitus without complications: Secondary | ICD-10-CM

## 2022-07-05 DIAGNOSIS — Z789 Other specified health status: Secondary | ICD-10-CM

## 2022-07-08 DIAGNOSIS — M0609 Rheumatoid arthritis without rheumatoid factor, multiple sites: Secondary | ICD-10-CM | POA: Diagnosis not present

## 2022-07-11 DIAGNOSIS — I5030 Unspecified diastolic (congestive) heart failure: Secondary | ICD-10-CM | POA: Diagnosis not present

## 2022-07-11 DIAGNOSIS — E118 Type 2 diabetes mellitus with unspecified complications: Secondary | ICD-10-CM | POA: Diagnosis not present

## 2022-07-11 DIAGNOSIS — M069 Rheumatoid arthritis, unspecified: Secondary | ICD-10-CM | POA: Diagnosis not present

## 2022-07-11 DIAGNOSIS — I2510 Atherosclerotic heart disease of native coronary artery without angina pectoris: Secondary | ICD-10-CM | POA: Diagnosis not present

## 2022-07-11 DIAGNOSIS — I480 Paroxysmal atrial fibrillation: Secondary | ICD-10-CM | POA: Diagnosis not present

## 2022-07-11 DIAGNOSIS — G4733 Obstructive sleep apnea (adult) (pediatric): Secondary | ICD-10-CM | POA: Diagnosis not present

## 2022-07-11 DIAGNOSIS — I10 Essential (primary) hypertension: Secondary | ICD-10-CM | POA: Diagnosis not present

## 2022-07-11 DIAGNOSIS — Z139 Encounter for screening, unspecified: Secondary | ICD-10-CM | POA: Diagnosis not present

## 2022-07-11 DIAGNOSIS — N400 Enlarged prostate without lower urinary tract symptoms: Secondary | ICD-10-CM | POA: Diagnosis not present

## 2022-07-11 DIAGNOSIS — E039 Hypothyroidism, unspecified: Secondary | ICD-10-CM | POA: Diagnosis not present

## 2022-07-22 ENCOUNTER — Other Ambulatory Visit: Payer: Self-pay | Admitting: Cardiology

## 2022-07-22 DIAGNOSIS — E781 Pure hyperglyceridemia: Secondary | ICD-10-CM

## 2022-08-05 DIAGNOSIS — R918 Other nonspecific abnormal finding of lung field: Secondary | ICD-10-CM | POA: Diagnosis not present

## 2022-08-05 DIAGNOSIS — R0600 Dyspnea, unspecified: Secondary | ICD-10-CM | POA: Diagnosis not present

## 2022-08-05 DIAGNOSIS — R5383 Other fatigue: Secondary | ICD-10-CM | POA: Diagnosis not present

## 2022-08-05 DIAGNOSIS — G4733 Obstructive sleep apnea (adult) (pediatric): Secondary | ICD-10-CM | POA: Diagnosis not present

## 2022-08-05 DIAGNOSIS — J019 Acute sinusitis, unspecified: Secondary | ICD-10-CM | POA: Diagnosis not present

## 2022-08-07 DIAGNOSIS — G4733 Obstructive sleep apnea (adult) (pediatric): Secondary | ICD-10-CM | POA: Diagnosis not present

## 2022-08-11 DIAGNOSIS — R051 Acute cough: Secondary | ICD-10-CM | POA: Diagnosis not present

## 2022-08-11 DIAGNOSIS — R5383 Other fatigue: Secondary | ICD-10-CM | POA: Diagnosis not present

## 2022-08-12 ENCOUNTER — Ambulatory Visit: Payer: PPO | Admitting: Cardiology

## 2022-08-12 ENCOUNTER — Encounter: Payer: Self-pay | Admitting: Cardiology

## 2022-08-12 VITALS — BP 97/62 | HR 85 | Resp 19 | Ht 70.0 in | Wt 268.0 lb

## 2022-08-12 DIAGNOSIS — E781 Pure hyperglyceridemia: Secondary | ICD-10-CM

## 2022-08-12 DIAGNOSIS — I10 Essential (primary) hypertension: Secondary | ICD-10-CM

## 2022-08-12 DIAGNOSIS — Z7901 Long term (current) use of anticoagulants: Secondary | ICD-10-CM | POA: Diagnosis not present

## 2022-08-12 DIAGNOSIS — I480 Paroxysmal atrial fibrillation: Secondary | ICD-10-CM

## 2022-08-12 DIAGNOSIS — E7800 Pure hypercholesterolemia, unspecified: Secondary | ICD-10-CM | POA: Diagnosis not present

## 2022-08-12 DIAGNOSIS — I5032 Chronic diastolic (congestive) heart failure: Secondary | ICD-10-CM

## 2022-08-12 DIAGNOSIS — E119 Type 2 diabetes mellitus without complications: Secondary | ICD-10-CM

## 2022-08-12 DIAGNOSIS — G4733 Obstructive sleep apnea (adult) (pediatric): Secondary | ICD-10-CM

## 2022-08-12 DIAGNOSIS — I5042 Chronic combined systolic (congestive) and diastolic (congestive) heart failure: Secondary | ICD-10-CM | POA: Diagnosis not present

## 2022-08-12 DIAGNOSIS — I7121 Aneurysm of the ascending aorta, without rupture: Secondary | ICD-10-CM | POA: Diagnosis not present

## 2022-08-12 DIAGNOSIS — Z789 Other specified health status: Secondary | ICD-10-CM

## 2022-08-12 NOTE — Progress Notes (Signed)
Joshua Lara Date of Birth: 13-Jun-1945 MRN: [id number] Primary Care Provider:[name], [name] Former Cardiology Providers: Jeri Lager, APRN, FNP-C Primary Cardiologist: Rex Kras, DO, [facility] (established care 10/28/2019)  Date: 08/12/22 Last Office Visit: 03/21/2022  Chief Complaint  Patient presents with  . Shortness of Breath  . Fatigue  . Follow-up    Newly discovered aortic Anuerysm  . Dizziness    HPI  Joshua Lara is a 77 y.o.  male whose past medical history and cardiovascular risk factors include: Paroxysmal atrial fibrillation, oral anticoagulation, long-term antiarrhythmic medication, recovered cardiomyopathy w/ HFimpEF, ascending aortic aneurysm 42 mm (CT Chest 07/04/2022), hypertension, hyperlipidemia, OSA on CPAP, diabetes mellitus, glaucoma, obesity due to excess calories, advanced age.  Presents for 4-month follow-up visit for management of PAF, HFimpEF, and hyperlipidemia.  Patient has a known history of heart failure with reduced EF and has been on GDMT at maximally tolerated doses.  He had an echocardiogram in December 2023 to reevaluate LVEF.  Based on the most recent echo his ejection fraction has improved to 60-65%; however, overall study quality was technically difficult.  Since then the patient also have a CT scan of the chest for lung cancer screening and his history of pulmonary nodules.  The most recent CT of the chest from February 2024 noted ascending aortic aneurysm of 42 mm (this is a new finding).  In addition, he has been experiencing more shortness of breath in the recent past, lightheaded and dizzy, and soft blood pressures at home.  In the past his Delene Loll was reduced to 49/51 mg p.o. twice daily.  The medication list that he provides has both of the formulations of Entresto on it 49/51 as well as 97/103 mg bid.  Accurate medication reconciliation is difficult to perform at today's office visit.  In addition, Jardiance was discontinued  due to yeast infections.  ALLERGIES: Allergies  Allergen Reactions  . Bactrim [Sulfamethoxazole-Trimethoprim] Swelling  . Ranitidine Anaphylaxis  . Ranitidine Hcl Anaphylaxis and Other (See Comments)    Has anaphylactic shock--intubated and in ICU x 4 days  . Adalimumab Other (See Comments)  . Etanercept Other (See Comments) and Cough    (ENBREL); Patient thinks it contributed to heart issues  . Etanercept     Other reaction(s): Cough (ALLERGY/intolerance), Other (See Comments) (ENBREL); Patient thinks it contributed to heart issues  . Sulfa Antibiotics Other (See Comments)  . Sulfur Other (See Comments)  . Tape Itching    Can only tolerate bandages for less than 24-hr periods Can only tolerate bandages for less than 24-hr periods    MEDICATION LIST PRIOR TO VISIT: Current Outpatient Medications on File Prior to Visit  Medication Sig Dispense Refill  . acetaminophen (TYLENOL) 650 MG CR tablet Take 650 mg by mouth every 8 (eight) hours as needed for pain.    Marland Kitchen albuterol (VENTOLIN HFA) 108 (90 Base) MCG/ACT inhaler Inhale into the lungs every 6 (six) hours as needed for wheezing or shortness of breath.    Marland Kitchen apixaban (ELIQUIS) 5 MG TABS tablet TAKE 1 TABLET BY MOUTH EVERY 12 HOURS 60 tablet 10  . BD PEN NEEDLE NANO 2ND GEN 32G X 4 MM MISC     . dorzolamide-timolol (COSOPT) 22.3-6.8 MG/ML ophthalmic solution 1 drop 2 (two) times daily.    . Evolocumab (REPATHA SURECLICK) XX123456 MG/ML SOAJ INJECT 140 MG INTO THE SKIN EVERY 14 (FOURTEEN) DAYS 6 mL 1  . fenofibrate (TRICOR) 145 MG tablet TAKE 1 TABLET BY MOUTH EVERY DAY 90  tablet 0  . fluticasone (FLONASE) 50 MCG/ACT nasal spray Place into both nostrils daily.    . folic acid (FOLVITE) 1 MG tablet Take 1 mg by mouth daily.    Marland Kitchen HUMULIN R U-500 KWIKPEN 500 UNIT/ML kwikpen Inject 125-150 Units into the skin 2 (two) times daily.     Marland Kitchen latanoprost (XALATAN) 0.005 % ophthalmic solution 1 drop at bedtime.    Marland Kitchen levothyroxine (SYNTHROID,  LEVOTHROID) 100 MCG tablet Take 100 mcg by mouth daily.    . methotrexate 2.5 MG tablet Take 5 mg by mouth once a week. 5mg  in Morning, 5mg  at night    . nitroGLYCERIN (NITROSTAT) 0.4 MG SL tablet Place 1 tablet (0.4 mg total) under the tongue every 5 (five) minutes as needed for chest pain. If you require more than two tablets five minutes apart go to the nearest ER via EMS. 30 tablet 0  . sacubitril-valsartan (ENTRESTO) 49-51 MG Take 1 tablet by mouth 2 (two) times daily. 60 tablet 3  . sotalol (BETAPACE) 80 MG tablet TAKE 0.5 TABLETS BY MOUTH EVERY MORNING AND TAKE 1 TABLET IN THE EVENING 135 tablet 3  . tamsulosin (FLOMAX) 0.4 MG CAPS capsule SMARTSIG:1 Capsule(s) By Mouth Every Evening    . TRULICITY [id number] [id number] SOPN INJECT 0.75 MG ONCE A WEEK SUBCUTANEOUS 30 DAYS    . pantoprazole (PROTONIX) 40 MG tablet TAKE 1 TABLET BY MOUTH TWICE A DAY (Patient not taking: Reported on 08/12/2022) 180 tablet 0  . sildenafil (VIAGRA) 100 MG tablet Take 100 mg by mouth every 4 (four) hours as needed. (Patient not taking: Reported on 03/21/2022)     No current facility-administered medications on file prior to visit.    PAST MEDICAL HISTORY: Past Medical History:  Diagnosis Date  . Arthritis    "hands, back, ankles" (05/11/2014)  . CHF (congestive heart failure)   . Chronic lower back pain   . Compression fracture of lumbar vertebra   . Coronary artery disease   . Daily headache    "recently" (05/11/2014)  . Depression    "I might be slight" (05/11/2014)  . GERD (gastroesophageal reflux disease)   . High cholesterol   . Hypertension   . Hypothyroid   . OSA treated with BiPAP   . Pneumonia    "couple times; last time was 03/2011" (05/11/2014)  . Type II diabetes mellitus     PAST SURGICAL HISTORY: Past Surgical History:  Procedure Laterality Date  . APPENDECTOMY    . CARDIAC CATHETERIZATION  2001; 2009  . CATARACT EXTRACTION W/ INTRAOCULAR LENS IMPLANT Left   . CHOLECYSTECTOMY  1980's    Archie Endo 07/13/2010   . EYE MUSCLE SURGERY Left   . LEFT AND RIGHT HEART CATHETERIZATION WITH CORONARY ANGIOGRAM N/A 05/30/2014   Procedure: LEFT AND RIGHT HEART CATHETERIZATION WITH CORONARY ANGIOGRAM;  Surgeon: Jolaine Artist, MD;  Location: [facility] CATH LAB;  Service: Cardiovascular;  Laterality: N/A;  . LEFT HEART CATH AND CORONARY ANGIOGRAPHY N/A 12/20/2018   Procedure: LEFT HEART CATH AND CORONARY ANGIOGRAPHY and possible intervention;  Surgeon: Adrian Prows, MD;  Location: [facility] CV LAB;  Service: Cardiovascular;  Laterality: N/A;  . MASTOIDECTOMY Right 1970's   Archie Endo 07/13/2010  . SHOULDER OPEN ROTATOR CUFF REPAIR Left 07/2010  . SPHINCTEROTOMY    . TONSILLECTOMY AND ADENOIDECTOMY      FAMILY HISTORY: The patient's family history includes Esophageal cancer in his brother; Heart attack in his father; Kidney failure in his mother.   SOCIAL HISTORY:  The  patient  reports that he quit smoking about 32 years ago. His smoking use included cigarettes. He has a 60.00 pack-year smoking history. He has never used smokeless tobacco. He reports current alcohol use. He reports that he does not use drugs.  Review of Systems  Constitutional: Positive for malaise/fatigue. Negative for chills and fever.  HENT:  Negative for hoarse voice and nosebleeds.   Eyes:  Negative for discharge, double vision and pain.  Cardiovascular:  Positive for dyspnea on exertion (chronic). Negative for chest pain, claudication, leg swelling, near-syncope, orthopnea, palpitations, paroxysmal nocturnal dyspnea and syncope.  Respiratory:  Positive for shortness of breath. Negative for hemoptysis.   Musculoskeletal:  Negative for muscle cramps and myalgias.  Gastrointestinal:  Negative for abdominal pain, constipation, diarrhea, hematemesis, hematochezia, melena, nausea and vomiting.  Neurological:  Positive for dizziness and light-headedness. Negative for vertigo.    PHYSICAL EXAM:    08/12/2022    2:59 PM 06/16/2022    8:45  PM 06/16/2022    7:00 PM  Vitals with BMI  Height 5\' 10"     Weight 268 lbs    BMI [id number]    Systolic 97  [id number]  Diastolic 62  67  Pulse 85 75 75   Orthostatic VS for the past 72 hrs (Last 3 readings):  Orthostatic BP Patient Position BP Location Cuff Size Orthostatic Pulse  08/12/22 1508 101/61 Standing Right Arm Large 87  08/12/22 1507 98/61 Sitting Right Arm Large 85  08/12/22 1506 116/71 Supine Right Arm Large 80   Physical Exam Cardiovascular:     Rate and Rhythm: Normal rate and regular rhythm.     Pulses:          Carotid pulses are 2+ on the right side and 2+ on the left side.      Radial pulses are 2+ on the right side and 2+ on the left side.       Dorsalis pedis pulses are 2+ on the right side and 2+ on the left side.       Posterior tibial pulses are 1+ on the right side and 1+ on the left side.     Heart sounds: Normal heart sounds. No murmur heard.    No gallop.  Pulmonary:     Effort: Pulmonary effort is normal.     Breath sounds: Normal breath sounds. No wheezing or rales.  Musculoskeletal:     Right lower leg: No edema.     Left lower leg: No edema.  Neurological:     Mental Status: He is alert.   CARDIAC DATABASE: EKG:  EKG 03/21/2022: Normal sinus rhythm with first-degree AV block at rate of 80 bpm.  Right axis deviation.  Poor R wave progression.  Nonspecific T wave abnormality.  Compared to previous EKG on 09/13/2021, no significant change  Echocardiogram: 01/14/2019: LVEF 45-50%, calculated LVEF [id number], grade 1 diastolic impairment, mild LAE, see report for details.   12/09/2019: EF 45-50%. Indeterminate diastolic filling pattern, elevated LAP. See report for details.   04/11/2022:  Study Quality: Technically difficult study.  Normal LV systolic function with visual EF 60-65%. Left ventricle cavity  is normal in size. Normal global wall motion. Normal diastolic filling  pattern, normal LAP. Mild left ventricular hypertrophy.  No significant valvular heart  disease.  Compared to 12/09/2019 LVEF improved from 45-50% to 60-65% otherwise no significant change.   Stress Testing:  Lexiscan/modified Bruce Tetrofosmin stress test 11/23/2019: Lexiscan/modified Bruce nuclear stress test performed using 1-day protocol. Stress EKG is  non-diagnostic, as this is pharmacological stress test. In addition, stress EKG at 71% [facility] showed sinus rhythm, possible old anteroseptal infarct, poor R wave progression, low voltage, no ischemic changes.   Large areas of soft tissue attenuation seen, particularly in vertical long axis images. In addition, Apical thinning seen on both rest and stress images may be physiological. Overall, no reversible ischemia noted. Stress LVEF 49%, although visually appears normal.  Low risk study.   Heart Catheterization: 12/20/2018: Left ventricle is borderline dilated.  Mild decrease in LV systolic function with global hypokinesis, EF 45%.  Normal LVEDP. Minimal luminal irregularity, no coronary artery disease.  Right dominant circulation.   LABORATORY DATA:    Latest Ref Rng & Units 06/16/2022    6:27 PM 11/23/2019    2:35 PM 12/16/2018   10:36 PM  CBC  WBC 4.0 - 10.5 K/uL 7.3  5.5  8.2   Hemoglobin 13.0 - 17.0 g/dL 14.8  14.9  14.1   Hematocrit 39.0 - 52.0 % 41.5  44.0  40.4   Platelets 150 - 400 K/uL 183  167  194    External Labs: Collected: 06/07/2021 provided by PCP. BUN 11, creatinine 0.9. eGFR 90. Sodium 141, potassium 4, chloride 103, bicarb 27 AST 23, ALT 27, alkaline phosphatase 113 Hemoglobin 16.9 g/dL, hematocrit 50.3% BNP 8.2 Total cholesterol 173, triglycerides 259, HDL 42, LDL 88   IMPRESSION:    ICD-10-CM   1. Chronic HFrEF (heart failure with reduced ejection fraction)  I50.22 Hemoglobin and hematocrit, blood    Pro b natriuretic peptide (BNP)    ECHOCARDIOGRAM COMPLETE    EKG 12-Lead    2. Primary hypertension  I10     3. Paroxysmal atrial fibrillation  I48.0 EKG 12-Lead    4. Long term (current) use  of anticoagulants  Z79.01     5. Type 2 diabetes mellitus without complication, with long-term current use of insulin  E11.9    Z79.4     6. Pure hypercholesterolemia  E78.00 Lipid Panel With LDL/HDL Ratio    LDL cholesterol, direct    CMP14+EGFR    7. Hypertriglyceridemia  E78.1     8. Statin intolerance  Z78.9     9. OSA treated with BiPAP  G47.33         RECOMMENDATIONS: Joshua Lara is a 77 y.o. male whose past medical history and cardiovascular risk factors include: Paroxysmal atrial fibrillation, oral anticoagulation, long-term antiarrhythmic medication, recovered cardiomyopathy w/ HFimpEF, ascending aortic aneurysm 42 mm (CT Chest 07/04/2022), hypertension, hyperlipidemia, OSA on CPAP, diabetes mellitus, glaucoma, obesity due to excess calories, advanced age.  Chronic HFrEF (heart failure with reduced ejection fraction) (HCC) Stage C NYHA class II. Euvolemic. Soft blood pressures likely secondary to medications.  Restart Entresto to 49/51 mg p.o. twice daily.  A new prescription for Delene Loll has been sent to his pharmacy. Will schedule for echocardiogram since this was done 2 years ago. Check labs to reevaluate kidney function and electrolytes.  Paroxysmal atrial fibrillation (HCC) Rate control: Sotalol. Rhythm control Sotalol. Thromboembolic prophylaxis Eliquis. CHA2DS2-VASc SCORE is 4 which correlates to 4.0% risk of stroke per year (age, CHF, HTN, DM,).  EKG today re- illustrated sinus rhythm.  Long term (current) use of anticoagulants /antiarrhythmic drugs Indication paroxysmal atrial fibrillation. Does not endorse evidence of bleeding.  Type 2 diabetes mellitus without complication, with long-term current use of insulin (HCC) With complications of CHF, glaucoma Currently on Entresto, Jardiance, insulin, Repatha, fenofibrate Currently managed per primary team.  Primary hypertension  Blood pressure is well controlled. Medication changes discussed  above.  Pure hypercholesterolemia / Statin intolerance Has been intolerant to statin therapy in the past due to myalgias. The last medication tried with Livalo which is similar side effect profile. Given the fact that he is a diabetic, LDLs are not well controlled, and other cardiovascular risk factors he was recommended to be on Repatha.  He is tolerating the medication well. Will check lipid panel and LFTs.  Hypertriglyceridemia Currently on fenofibrate. We will continue to monitor.  OSA treated with BiPAP Remains compliant with BiPAP use.  FINAL MEDICATION LIST END OF ENCOUNTER: No orders of the defined types were placed in this encounter.    Current Outpatient Medications:  .  acetaminophen (TYLENOL) 650 MG CR tablet, Take 650 mg by mouth every 8 (eight) hours as needed for pain., Disp: , Rfl:  .  albuterol (VENTOLIN HFA) 108 (90 Base) MCG/ACT inhaler, Inhale into the lungs every 6 (six) hours as needed for wheezing or shortness of breath., Disp: , Rfl:  .  apixaban (ELIQUIS) 5 MG TABS tablet, TAKE 1 TABLET BY MOUTH EVERY 12 HOURS, Disp: 60 tablet, Rfl: 10 .  BD PEN NEEDLE NANO 2ND GEN 32G X 4 MM MISC, , Disp: , Rfl:  .  dorzolamide-timolol (COSOPT) 22.3-6.8 MG/ML ophthalmic solution, 1 drop 2 (two) times daily., Disp: , Rfl:  .  Evolocumab (REPATHA SURECLICK) [id number] MG/ML SOAJ, INJECT 140 MG INTO THE SKIN EVERY 14 (FOURTEEN) DAYS, Disp: 6 mL, Rfl: 1 .  fenofibrate (TRICOR) 145 MG tablet, TAKE 1 TABLET BY MOUTH EVERY DAY, Disp: 90 tablet, Rfl: 0 .  fluticasone (FLONASE) 50 MCG/ACT nasal spray, Place into both nostrils daily., Disp: , Rfl:  .  folic acid (FOLVITE) 1 MG tablet, Take 1 mg by mouth daily., Disp: , Rfl:  .  HUMULIN R U-500 KWIKPEN 500 UNIT/ML kwikpen, Inject 125-150 Units into the skin 2 (two) times daily. , Disp: , Rfl:  .  latanoprost (XALATAN) 0.005 % ophthalmic solution, 1 drop at bedtime., Disp: , Rfl:  .  levothyroxine (SYNTHROID, LEVOTHROID) 100 MCG tablet, Take 100  mcg by mouth daily., Disp: , Rfl:  .  methotrexate 2.5 MG tablet, Take 5 mg by mouth once a week. 5mg  in Morning, 5mg  at night, Disp: , Rfl:  .  nitroGLYCERIN (NITROSTAT) 0.4 MG SL tablet, Place 1 tablet (0.4 mg total) under the tongue every 5 (five) minutes as needed for chest pain. If you require more than two tablets five minutes apart go to the nearest ER via EMS., Disp: 30 tablet, Rfl: 0 .  sacubitril-valsartan (ENTRESTO) 49-51 MG, Take 1 tablet by mouth 2 (two) times daily., Disp: 60 tablet, Rfl: 3 .  sotalol (BETAPACE) 80 MG tablet, TAKE 0.5 TABLETS BY MOUTH EVERY MORNING AND TAKE 1 TABLET IN THE EVENING, Disp: 135 tablet, Rfl: 3 .  tamsulosin (FLOMAX) 0.4 MG CAPS capsule, SMARTSIG:1 Capsule(s) By Mouth Every Evening, Disp: , Rfl:  .  TRULICITY [id number] [id number] SOPN, INJECT 0.75 MG ONCE A WEEK SUBCUTANEOUS 30 DAYS, Disp: , Rfl:  .  pantoprazole (PROTONIX) 40 MG tablet, TAKE 1 TABLET BY MOUTH TWICE A DAY (Patient not taking: Reported on 08/12/2022), Disp: 180 tablet, Rfl: 0 .  sildenafil (VIAGRA) 100 MG tablet, Take 100 mg by mouth every 4 (four) hours as needed. (Patient not taking: Reported on 03/21/2022), Disp: , Rfl:   Orders Placed This Encounter  Procedures  . Lipid Panel With LDL/HDL Ratio  . LDL cholesterol, direct  .  CMP14+EGFR  . Hemoglobin and hematocrit, blood  . Pro b natriuretic peptide (BNP)  . EKG 12-Lead  . ECHOCARDIOGRAM COMPLETE   --Continue cardiac medications as reconciled in final medication list. --Return in about 2 weeks (around 08/26/2022) for Follow up, Dyspnea. Or sooner if needed. --Continue follow-up with your primary care physician regarding the management of your other chronic comorbid conditions.  Patient's questions and concerns were addressed to his satisfaction. He voices understanding of the instructions provided during this encounter.   This note was created using a voice recognition software as a result there may be grammatical errors inadvertently  enclosed that do not reflect the nature of this encounter. Every attempt is made to correct such errors.    Mick Sell, [place] Pager: [phone] Office: [phone]

## 2022-08-13 DIAGNOSIS — E7800 Pure hypercholesterolemia, unspecified: Secondary | ICD-10-CM | POA: Diagnosis not present

## 2022-08-13 DIAGNOSIS — I5030 Unspecified diastolic (congestive) heart failure: Secondary | ICD-10-CM | POA: Diagnosis not present

## 2022-08-22 DIAGNOSIS — I5030 Unspecified diastolic (congestive) heart failure: Secondary | ICD-10-CM | POA: Diagnosis not present

## 2022-08-22 DIAGNOSIS — E782 Mixed hyperlipidemia: Secondary | ICD-10-CM | POA: Diagnosis not present

## 2022-08-24 NOTE — Progress Notes (Signed)
External Labs: Collected: 08/13/2022 provided by PCP BUN 16, creatinine 1.08. BUN to creatinine ratio 15. Sodium 135, potassium 4.4, chloride 99, bicarb 23. AST 26, ALT 24, alkaline phosphatase 64 Hemoglobin 15.1, hematocrit 43.9% BNP 4.4  Medical assistant: 1.  Renal function and fluid markers are within normal limits #2 aspirin is doing with regards to shortness of breath when compared to when he came into the office on 08/12/2022 #3 please do a complete med reconciliation and verify the dose of Entresto with his physical bottles as records that he brought in at the last visit were incorrect  Tessa Lerner, DO, [facility]

## 2022-08-27 ENCOUNTER — Other Ambulatory Visit: Payer: Self-pay

## 2022-08-27 MED ORDER — ENTRESTO 49-51 MG PO TABS: 1.0000 | ORAL_TABLET | Freq: Two times a day (BID) | ORAL | 3 refills | Status: DC

## 2022-08-27 NOTE — Progress Notes (Signed)
Gave results, patient acknowledged understanding and had no further questions.  Patient said SOB isn't any better.  He is taking Entresto 49-51

## 2022-09-02 ENCOUNTER — Other Ambulatory Visit (HOSPITAL_COMMUNITY): Payer: PPO

## 2022-09-03 NOTE — Progress Notes (Signed)
LMTCB

## 2022-09-05 ENCOUNTER — Ambulatory Visit (HOSPITAL_COMMUNITY)
Admission: RE | Admit: 2022-09-05 | Discharge: 2022-09-05 | Disposition: A | Discharge status: Home / Self Care | Payer: PPO | Source: Ambulatory Visit | Attending: Cardiology | Admitting: Cardiology

## 2022-09-05 DIAGNOSIS — I5032 Chronic diastolic (congestive) heart failure: Secondary | ICD-10-CM | POA: Diagnosis not present

## 2022-09-05 DIAGNOSIS — I2510 Atherosclerotic heart disease of native coronary artery without angina pectoris: Secondary | ICD-10-CM | POA: Insufficient documentation

## 2022-09-05 DIAGNOSIS — F17200 Nicotine dependence, unspecified, uncomplicated: Secondary | ICD-10-CM | POA: Diagnosis not present

## 2022-09-05 DIAGNOSIS — E119 Type 2 diabetes mellitus without complications: Secondary | ICD-10-CM | POA: Diagnosis not present

## 2022-09-05 DIAGNOSIS — I110 Hypertensive heart disease with heart failure: Secondary | ICD-10-CM | POA: Diagnosis not present

## 2022-09-05 DIAGNOSIS — G4730 Sleep apnea, unspecified: Secondary | ICD-10-CM | POA: Insufficient documentation

## 2022-09-05 DIAGNOSIS — I5020 Unspecified systolic (congestive) heart failure: Secondary | ICD-10-CM

## 2022-09-05 LAB — ECHOCARDIOGRAM COMPLETE
AR max vel: 2.88 cm2
AV Area VTI: 3.28 cm2
AV Area mean vel: 3.12 cm2
AV Mean grad: 3 mmHg
AV Peak grad: 5.2 mmHg
Ao pk vel: 1.14 m/s
Area-P 1/2: 3.72 cm2
Calc EF: 70.7 %
S' Lateral: 3.2 cm
Single Plane A2C EF: 75.8 %
Single Plane A4C EF: 64.6 %

## 2022-09-08 DIAGNOSIS — R918 Other nonspecific abnormal finding of lung field: Secondary | ICD-10-CM | POA: Diagnosis not present

## 2022-09-08 DIAGNOSIS — G4733 Obstructive sleep apnea (adult) (pediatric): Secondary | ICD-10-CM | POA: Diagnosis not present

## 2022-09-08 DIAGNOSIS — J4530 Mild persistent asthma, uncomplicated: Secondary | ICD-10-CM | POA: Diagnosis not present

## 2022-09-08 DIAGNOSIS — R0600 Dyspnea, unspecified: Secondary | ICD-10-CM | POA: Diagnosis not present

## 2022-09-08 DIAGNOSIS — R5383 Other fatigue: Secondary | ICD-10-CM | POA: Diagnosis not present

## 2022-09-08 DIAGNOSIS — E559 Vitamin D deficiency, unspecified: Secondary | ICD-10-CM | POA: Diagnosis not present

## 2022-09-08 NOTE — Progress Notes (Signed)
Called patient to inform him about his echo results. Patient understood.

## 2022-09-12 ENCOUNTER — Ambulatory Visit: Payer: PPO | Admitting: Cardiology

## 2022-09-12 ENCOUNTER — Encounter: Payer: Self-pay | Admitting: Cardiology

## 2022-09-12 VITALS — BP 108/70 | HR 87 | Resp 20 | Ht 70.0 in | Wt 269.0 lb

## 2022-09-12 DIAGNOSIS — I480 Paroxysmal atrial fibrillation: Secondary | ICD-10-CM

## 2022-09-12 DIAGNOSIS — I5032 Chronic diastolic (congestive) heart failure: Secondary | ICD-10-CM | POA: Diagnosis not present

## 2022-09-12 DIAGNOSIS — Z7901 Long term (current) use of anticoagulants: Secondary | ICD-10-CM

## 2022-09-12 MED ORDER — ENTRESTO 24-26 MG PO TABS: 1.0000 | ORAL_TABLET | Freq: Two times a day (BID) | ORAL | 0 refills | Status: DC

## 2022-09-12 NOTE — Progress Notes (Signed)
Joshua Lara Date of Birth: [birth year] MRN: [id number] Primary Care Provider:[name], Aldean Jewett Former Cardiology Providers: Altamese [place], APRN, FNP-C Primary Cardiologist: Tessa Lerner, DO, [facility] (established care 10/28/2019)  Date: 09/12/22 Last Office Visit: 08/12/2022  Chief Complaint  Patient presents with   Atrial Fibrillation   Shortness of Breath   Follow-up    HPI  Joshua Lara is a 77 y.o.  male whose past medical history and cardiovascular risk factors include: Paroxysmal atrial fibrillation, oral anticoagulation, long-term antiarrhythmic medication, recovered cardiomyopathy w/ HFimpEF, ascending aortic aneurysm 42 mm (CT Chest 07/04/2022), hypertension, hyperlipidemia, OSA on CPAP, diabetes mellitus, glaucoma, obesity due to excess calories, advanced age.  Patient is being followed by the practice for paroxysmal atrial fibrillation, heart failure with improved EF, and hyperlipidemia.  When he presented to the office in April 2023 he was experiencing shortness of breath more than his baseline.  Patient was recommended to have lab work to evaluate renal function as well as hemoglobin given the fact that he is on anticoagulation and to rule out anemia.  Outside labs independently reviewed hemoglobin is stable renal function is within acceptable limits.  Echocardiogram performed his last office visit verifies that the LVEF remains stable and overall preserved, diastolic dysfunction has not worsened, and estimated RAP 3 mmHg.  He followed up with pulmonary medicine and was started on montelukast and his shortness of breath has improved.  He is currently awaiting inhaler to be approved by his insurance company which may further improve his dyspnea.  Patient was recently diagnosed with ascending aortic aneurysm measuring 42 mm on recent CT from February 2024.  Most recent echocardiogram documents the ascending aorta to be 42 mm as well.  There is good correlation between both  modalities.  He continues to experience lightheaded and dizziness with changing positions quickly.  He did bring his medication bottles today for review and reconciliation.  ALLERGIES: Allergies  Allergen Reactions   Bactrim [Sulfamethoxazole-Trimethoprim] Swelling   Ranitidine Anaphylaxis   Ranitidine Hcl Anaphylaxis and Other (See Comments)    Has anaphylactic shock--intubated and in ICU x 4 days   Adalimumab Other (See Comments)   Etanercept Other (See Comments) and Cough    (ENBREL); Patient thinks it contributed to heart issues   Etanercept     Other reaction(s): Cough (ALLERGY/intolerance), Other (See Comments) (ENBREL); Patient thinks it contributed to heart issues   Sulfa Antibiotics Other (See Comments)   Sulfur Other (See Comments)   Tape Itching    Can only tolerate bandages for less than 24-hr periods Can only tolerate bandages for less than 24-hr periods    MEDICATION LIST PRIOR TO VISIT: Current Outpatient Medications on File Prior to Visit  Medication Sig Dispense Refill   acetaminophen (TYLENOL) 650 MG CR tablet Take 650 mg by mouth every 8 (eight) hours as needed for pain.     albuterol (VENTOLIN HFA) 108 (90 Base) MCG/ACT inhaler Inhale into the lungs every 6 (six) hours as needed for wheezing or shortness of breath.     apixaban (ELIQUIS) 5 MG TABS tablet TAKE 1 TABLET BY MOUTH EVERY 12 HOURS 60 tablet 10   BD PEN NEEDLE NANO 2ND GEN 32G X 4 MM MISC      cyanocobalamin (VITAMIN B12) 500 MCG tablet Take 500 mcg by mouth daily.     dorzolamide-timolol (COSOPT) 22.3-6.8 MG/ML ophthalmic solution 1 drop 2 (two) times daily.     Evolocumab (REPATHA SURECLICK) 140 MG/ML SOAJ INJECT 140 MG INTO THE SKIN  EVERY 14 (FOURTEEN) DAYS 6 mL 1   fenofibrate (TRICOR) 145 MG tablet TAKE 1 TABLET BY MOUTH EVERY DAY 90 tablet 0   folic acid (FOLVITE) 1 MG tablet Take 1 mg by mouth daily.     HUMULIN R U-500 KWIKPEN 500 UNIT/ML kwikpen Inject 125-150 Units into the skin 2 (two)  times daily.      latanoprost (XALATAN) 0.005 % ophthalmic solution 1 drop at bedtime.     levothyroxine (SYNTHROID, LEVOTHROID) 100 MCG tablet Take 100 mcg by mouth daily.     methotrexate 2.5 MG tablet Take 5 mg by mouth once a week. 5mg  in Morning, 5mg  at night     montelukast (SINGULAIR) 10 MG tablet Take 10 mg by mouth daily.     nitroGLYCERIN (NITROSTAT) 0.4 MG SL tablet Place 1 tablet (0.4 mg total) under the tongue every 5 (five) minutes as needed for chest pain. If you require more than two tablets five minutes apart go to the nearest ER via EMS. 30 tablet 0   sotalol (BETAPACE) 80 MG tablet TAKE 0.5 TABLETS BY MOUTH EVERY MORNING AND TAKE 1 TABLET IN THE EVENING 135 tablet 3   tamsulosin (FLOMAX) 0.4 MG CAPS capsule SMARTSIG:1 Capsule(s) By Mouth Every Evening     TRULICITY 0.75 MG/0.5ML SOPN INJECT 0.75 MG ONCE A WEEK SUBCUTANEOUS 30 DAYS     Vitamin D, Ergocalciferol, (DRISDOL) 1.25 MG (50000 UNIT) CAPS capsule Take 50,000 Units by mouth once a week.     fluticasone (FLONASE) 50 MCG/ACT nasal spray Place into both nostrils daily.     No current facility-administered medications on file prior to visit.    PAST MEDICAL HISTORY: Past Medical History:  Diagnosis Date   Arthritis    "hands, back, ankles" (05/11/2014)   CHF (congestive heart failure) (HCC)    Chronic lower back pain    Compression fracture of lumbar vertebra (HCC)    Coronary artery disease    Daily headache    "recently" (05/11/2014)   Depression    "I might be slight" (05/11/2014)   GERD (gastroesophageal reflux disease)    High cholesterol    Hypertension    Hypothyroid    OSA treated with BiPAP    Pneumonia    "couple times; last time was 03/2011" (05/11/2014)   Type II diabetes mellitus (HCC)     PAST SURGICAL HISTORY: Past Surgical History:  Procedure Laterality Date   APPENDECTOMY     CARDIAC CATHETERIZATION  2001; 2009   CATARACT EXTRACTION W/ INTRAOCULAR LENS IMPLANT Left    CHOLECYSTECTOMY   1980's   Hattie Perch 07/13/2010    EYE MUSCLE SURGERY Left    LEFT AND RIGHT HEART CATHETERIZATION WITH CORONARY ANGIOGRAM N/A 05/30/2014   Procedure: LEFT AND RIGHT HEART CATHETERIZATION WITH CORONARY ANGIOGRAM;  Surgeon: Dolores Patty, MD;  Location: [facility] CATH LAB;  Service: Cardiovascular;  Laterality: N/A;   LEFT HEART CATH AND CORONARY ANGIOGRAPHY N/A 12/20/2018   Procedure: LEFT HEART CATH AND CORONARY ANGIOGRAPHY and possible intervention;  Surgeon: [name], MD;  Location: [facility] INVASIVE CV LAB;  Service: Cardiovascular;  Laterality: N/A;   MASTOIDECTOMY Right 1970's   Hattie Perch 07/13/2010   SHOULDER OPEN ROTATOR CUFF REPAIR Left 07/2010   SPHINCTEROTOMY     TONSILLECTOMY AND ADENOIDECTOMY      FAMILY HISTORY: The patient's family history includes Esophageal cancer in his brother; Heart attack in his father; Kidney failure in his mother.   SOCIAL HISTORY:  The patient  reports that he quit  smoking about 32 years ago. His smoking use included cigarettes. He has a 60.00 pack-year smoking history. He has never used smokeless tobacco. He reports current alcohol use. He reports that he does not use drugs.  Review of Systems  Constitutional: Positive for malaise/fatigue (better). Negative for chills and fever.  HENT:  Negative for hoarse voice and nosebleeds.   Eyes:  Negative for discharge, double vision and pain.  Cardiovascular:  Positive for dyspnea on exertion (chronic). Negative for chest pain, claudication, leg swelling, near-syncope, orthopnea, palpitations, paroxysmal nocturnal dyspnea and syncope.  Respiratory:  Positive for shortness of breath. Negative for hemoptysis.   Musculoskeletal:  Negative for muscle cramps and myalgias.  Gastrointestinal:  Negative for abdominal pain, constipation, diarrhea, hematemesis, hematochezia, melena, nausea and vomiting.  Neurological:  Positive for dizziness and light-headedness. Negative for vertigo.    PHYSICAL EXAM:    09/12/2022    1:24 PM  08/12/2022    2:59 PM 06/16/2022    8:45 PM  Vitals with BMI  Height 5\' 10"  5\' 10"    Weight 269 lbs 268 lbs   BMI 38.6 38.45   Systolic 108 97   Diastolic 70 62   Pulse 87 85 75   Orthostatic VS for the past 72 hrs (Last 3 readings):  Orthostatic BP Patient Position BP Location Cuff Size Orthostatic Pulse  09/12/22 1336 106/64 Standing Right Arm Large 79  09/12/22 1335 98/65 Sitting Right Arm Large 90  09/12/22 1334 119/71 Supine Right Arm Large 75   Physical Exam Cardiovascular:     Rate and Rhythm: Normal rate and regular rhythm.     Pulses:          Carotid pulses are 2+ on the right side and 2+ on the left side.      Radial pulses are 2+ on the right side and 2+ on the left side.       Dorsalis pedis pulses are 2+ on the right side and 2+ on the left side.       Posterior tibial pulses are 1+ on the right side and 1+ on the left side.     Heart sounds: Normal heart sounds. No murmur heard.    No gallop.  Pulmonary:     Effort: Pulmonary effort is normal.     Breath sounds: Normal breath sounds. No wheezing or rales.  Musculoskeletal:     Right lower leg: No edema.     Left lower leg: No edema.  Neurological:     Mental Status: He is alert.    CARDIAC DATABASE: EKG:  Sep 12, 2022: Sinus rhythm, 76 bpm, first-degree AV block, without underlying injury pattern.  Echocardiogram: 01/14/2019: LVEF 45-50%, calculated LVEF 40%, grade 1 diastolic impairment, mild LAE, see report for details.   12/09/2019: EF 45-50%. Indeterminate diastolic filling pattern, elevated LAP. See report for details.   04/11/2022: LVEF 60 to 65%, mild LVH, no significant valvular heart disease.  See report for additional details.  04/26/024:   1. Left ventricular ejection fraction, by estimation, is 60 to 65%. The left ventricle has normal function. The left ventricle has no regional wall motion abnormalities. There is mild left ventricular hypertrophy. Left ventricular diastolic parameters are  consistent with Grade I diastolic dysfunction (impaired relaxation).   2. Right ventricular systolic function is normal. The right ventricular size is normal.   3. The mitral valve is normal in structure. No evidence of mitral valve regurgitation. No evidence of mitral stenosis.   4. The aortic  valve is tricuspid. There is mild calcification of the aortic valve. There is mild thickening of the aortic valve. Aortic valve regurgitation is not visualized. Aortic valve sclerosis is present, with no evidence of aortic valve stenosis.   5. Aortic dilatation noted. There is moderate dilatation of the ascending aorta, measuring 42 mm.   6. The inferior vena cava is normal in size with greater than 50% respiratory variability, suggesting right atrial pressure of 3 mmHg.    Stress Testing:  Lexiscan/modified Bruce Tetrofosmin stress test 11/23/2019: Lexiscan/modified Bruce nuclear stress test performed using 1-day protocol. Stress EKG is non-diagnostic, as this is pharmacological stress test. In addition, stress EKG at 71% MPHR showed sinus rhythm, possible old anteroseptal infarct, poor R wave progression, low voltage, no ischemic changes.   Large areas of soft tissue attenuation seen, particularly in vertical long axis images. In addition, Apical thinning seen on both rest and stress images may be physiological. Overall, no reversible ischemia noted. Stress LVEF 49%, although visually appears normal.  Low risk study.   Heart Catheterization: 12/20/2018: Left ventricle is borderline dilated.  Mild decrease in LV systolic function with global hypokinesis, EF 45%.  Normal LVEDP. Minimal luminal irregularity, no coronary artery disease.  Right dominant circulation.   LABORATORY DATA:    Latest Ref Rng & Units 06/16/2022    6:27 PM 11/23/2019    2:35 PM 12/16/2018   10:36 PM  CBC  WBC 4.0 - 10.5 K/uL 7.3  5.5  8.2   Hemoglobin 13.0 - 17.0 g/dL 40.9  81.1  91.4   Hematocrit 39.0 - 52.0 % 41.5  44.0  40.4    Platelets 150 - 400 K/uL 183  167  194    External Labs: Collected: 06/07/2021 provided by PCP. BUN 11, creatinine 0.9. eGFR 90. Sodium 141, potassium 4, chloride 103, bicarb 27 AST 23, ALT 27, alkaline phosphatase 113 Hemoglobin 16.9 g/dL, hematocrit 78.2% BNP 8.2 Total cholesterol 173, triglycerides 259, HDL 42, LDL 88  External Labs: Collected: 08/13/2022 provided by PCP BUN 16, creatinine 1.08. BUN to creatinine ratio 15. Sodium 135, potassium 4.4, chloride 99, bicarb 23. AST 26, ALT 24, alkaline phosphatase 64 Hemoglobin 15.1, hematocrit 43.9% BNP 4.4  IMPRESSION:    ICD-10-CM   1. Paroxysmal atrial fibrillation (HCC)  I48.0 EKG 12-Lead    2. Long term (current) use of anticoagulants  Z79.01     3. Heart failure with improved ejection fraction (HFimpEF) (HCC)  I50.32 sacubitril-valsartan (ENTRESTO) 24-26 MG        RECOMMENDATIONS: DEKLYN APPLEWHITE is a 77 y.o. male whose past medical history and cardiovascular risk factors include: Paroxysmal atrial fibrillation, oral anticoagulation, long-term antiarrhythmic medication, recovered cardiomyopathy w/ HFimpEF, ascending aortic aneurysm 42 mm (CT Chest 07/04/2022), aortic atherosclerosis, hypertension, hyperlipidemia, OSA on CPAP, diabetes mellitus, glaucoma, obesity due to excess calories, advanced age.  Heart failure with improved ejection fraction (HFimpEF) Stage C, NYHA class II/III Prior LVEF 45-50% (11/2019) and most recent LVEF 60 to 65% with grade 1 diastolic impairment, and estimated RAP 3 mmHg. Medications reconciled. Will reduce Entresto 24/26 mg p.o. twice daily given his symptoms of orthostasis.  New prescription sent. Joshua Lara has been discontinued due to yeast infection. Echocardiogram notes no significant valvular heart disease. Most recent blood work notes stable hemoglobin levels and patient does not endorse evidence of bleeding I suspect that his dyspnea that he continues to experience is likely secondary  to pulmonary etiology.   His symptoms have improved after starting montelukast. He is awaiting  approval from insurance company regards to inhalers. Of note, he is on methotrexate which has been associated with pulmonary toxicity in the past.  I have asked him to discuss this further with PCP and pulmonary medicine.  Aneurysm of ascending aorta without rupture He had a CT chest at [facility] on 07/08/2022 per report ascending aortic aneurysm measures 42 mm.  Echo from April 2024 notes ascending aorta to be 42 mm as well. CT of the chest scheduled for March 2025. Reemphasized importance of blood pressure management. Avoid medications such as ciprofloxacin.  Avoid increasing intra-abdominal pressure by lifting heavy objects.  Primary hypertension Office BP and home readings are soft.  Orthostatic VS are positive  Reduce the dose of Entresto as discussed above. Hemoglobin remained stable.  Patient does not endorse evidence of bleeding.  Paroxysmal atrial fibrillation Long term (current) use of anticoagulants Rate control: Sotalol Rhythm control: Sotalol. Thromboembolic prophylaxis: Eliquis [id number] SCORE is 6 which correlates to 9.7% risk of stroke per year (diabetes, aortic atherosclerosis, HTN CHF, age). EKG today illustrates sinus rhythm. Patient does not endorse evidence of bleeding Discussed risks, benefits, alternatives to anticoagulation  Type 2 diabetes mellitus without complication, with long-term current use of insulin With complications of glaucoma and CHF Currently on Entresto, insulin, Repatha, fenofibrate. Jardiance attempted but discontinued due to history of yeast infection  Pure hypercholesterolemia Hypertriglyceridemia Statin intolerance Currently on medical therapy.  OSA treated with BiPAP Remains compliant with BiPAP use.  FINAL MEDICATION LIST END OF ENCOUNTER: Meds ordered this encounter  Medications   sacubitril-valsartan (ENTRESTO) 24-26 MG     Sig: Take 1 tablet by mouth 2 (two) times daily.    Dispense:  60 tablet    Refill:  0     Current Outpatient Medications:    acetaminophen (TYLENOL) 650 MG CR tablet, Take 650 mg by mouth every 8 (eight) hours as needed for pain., Disp: , Rfl:    albuterol (VENTOLIN HFA) 108 (90 Base) MCG/ACT inhaler, Inhale into the lungs every 6 (six) hours as needed for wheezing or shortness of breath., Disp: , Rfl:    apixaban (ELIQUIS) 5 MG TABS tablet, TAKE 1 TABLET BY MOUTH EVERY 12 HOURS, Disp: 60 tablet, Rfl: 10   BD PEN NEEDLE NANO 2ND GEN 32G X 4 MM MISC, , Disp: , Rfl:    cyanocobalamin (VITAMIN B12) 500 MCG tablet, Take 500 mcg by mouth daily., Disp: , Rfl:    dorzolamide-timolol (COSOPT) 22.3-6.8 MG/ML ophthalmic solution, 1 drop 2 (two) times daily., Disp: , Rfl:    Evolocumab (REPATHA SURECLICK) 140 MG/ML SOAJ, INJECT 140 MG INTO THE SKIN EVERY 14 (FOURTEEN) DAYS, Disp: 6 mL, Rfl: 1   fenofibrate (TRICOR) 145 MG tablet, TAKE 1 TABLET BY MOUTH EVERY DAY, Disp: 90 tablet, Rfl: 0   folic acid (FOLVITE) 1 MG tablet, Take 1 mg by mouth daily., Disp: , Rfl:    HUMULIN R U-500 KWIKPEN 500 UNIT/ML kwikpen, Inject 125-150 Units into the skin 2 (two) times daily. , Disp: , Rfl:    latanoprost (XALATAN) 0.005 % ophthalmic solution, 1 drop at bedtime., Disp: , Rfl:    levothyroxine (SYNTHROID, LEVOTHROID) 100 MCG tablet, Take 100 mcg by mouth daily., Disp: , Rfl:    methotrexate 2.5 MG tablet, Take 5 mg by mouth once a week. 5mg  in Morning, 5mg  at night, Disp: , Rfl:    montelukast (SINGULAIR) 10 MG tablet, Take 10 mg by mouth daily., Disp: , Rfl:    nitroGLYCERIN (NITROSTAT) 0.4 MG SL  tablet, Place 1 tablet (0.4 mg total) under the tongue every 5 (five) minutes as needed for chest pain. If you require more than two tablets five minutes apart go to the nearest ER via EMS., Disp: 30 tablet, Rfl: 0   sacubitril-valsartan (ENTRESTO) 24-26 MG, Take 1 tablet by mouth 2 (two) times daily., Disp: 60 tablet, Rfl:  0   sotalol (BETAPACE) 80 MG tablet, TAKE 0.5 TABLETS BY MOUTH EVERY MORNING AND TAKE 1 TABLET IN THE EVENING, Disp: 135 tablet, Rfl: 3   tamsulosin (FLOMAX) 0.4 MG CAPS capsule, SMARTSIG:1 Capsule(s) By Mouth Every Evening, Disp: , Rfl:    TRULICITY 0.75 MG/0.5ML SOPN, INJECT 0.75 MG ONCE A WEEK SUBCUTANEOUS 30 DAYS, Disp: , Rfl:    Vitamin D, Ergocalciferol, (DRISDOL) 1.25 MG (50000 UNIT) CAPS capsule, Take 50,000 Units by mouth once a week., Disp: , Rfl:    fluticasone (FLONASE) 50 MCG/ACT nasal spray, Place into both nostrils daily., Disp: , Rfl:   Orders Placed This Encounter  Procedures   EKG 12-Lead   --Continue cardiac medications as reconciled in final medication list. --Return in about 6 months (around 03/15/2023) for Follow up, Dyspnea, heart failure management.. Or sooner if needed. --Continue follow-up with your primary care physician regarding the management of your other chronic comorbid conditions.  Patient's questions and concerns were addressed to his satisfaction. He voices understanding of the instructions provided during this encounter.   This note was created using a voice recognition software as a result there may be grammatical errors inadvertently enclosed that do not reflect the nature of this encounter. Every attempt is made to correct such errors.   Tessa Lerner, [place], [facility]  Pager:  [phone] Office: [phone]

## 2022-09-18 ENCOUNTER — Other Ambulatory Visit: Payer: Self-pay | Admitting: Internal Medicine

## 2022-09-18 DIAGNOSIS — Z789 Other specified health status: Secondary | ICD-10-CM

## 2022-09-18 DIAGNOSIS — I5022 Chronic systolic (congestive) heart failure: Secondary | ICD-10-CM

## 2022-09-18 DIAGNOSIS — E119 Type 2 diabetes mellitus without complications: Secondary | ICD-10-CM

## 2022-09-18 DIAGNOSIS — E7800 Pure hypercholesterolemia, unspecified: Secondary | ICD-10-CM

## 2022-09-19 ENCOUNTER — Ambulatory Visit: Payer: PPO

## 2022-09-19 ENCOUNTER — Ambulatory Visit: Payer: PPO | Admitting: Internal Medicine

## 2022-09-19 DIAGNOSIS — Z794 Long term (current) use of insulin: Secondary | ICD-10-CM | POA: Diagnosis not present

## 2022-09-19 DIAGNOSIS — H409 Unspecified glaucoma: Secondary | ICD-10-CM | POA: Diagnosis not present

## 2022-09-19 DIAGNOSIS — E1142 Type 2 diabetes mellitus with diabetic polyneuropathy: Secondary | ICD-10-CM | POA: Diagnosis not present

## 2022-09-19 DIAGNOSIS — E039 Hypothyroidism, unspecified: Secondary | ICD-10-CM | POA: Diagnosis not present

## 2022-09-19 DIAGNOSIS — E1136 Type 2 diabetes mellitus with diabetic cataract: Secondary | ICD-10-CM | POA: Diagnosis not present

## 2022-09-22 DIAGNOSIS — R918 Other nonspecific abnormal finding of lung field: Secondary | ICD-10-CM | POA: Diagnosis not present

## 2022-09-22 DIAGNOSIS — J4530 Mild persistent asthma, uncomplicated: Secondary | ICD-10-CM | POA: Diagnosis not present

## 2022-09-22 DIAGNOSIS — R5383 Other fatigue: Secondary | ICD-10-CM | POA: Diagnosis not present

## 2022-09-22 DIAGNOSIS — G4733 Obstructive sleep apnea (adult) (pediatric): Secondary | ICD-10-CM | POA: Diagnosis not present

## 2022-10-08 DIAGNOSIS — R5383 Other fatigue: Secondary | ICD-10-CM | POA: Diagnosis not present

## 2022-10-08 DIAGNOSIS — J4530 Mild persistent asthma, uncomplicated: Secondary | ICD-10-CM | POA: Diagnosis not present

## 2022-10-08 DIAGNOSIS — G4733 Obstructive sleep apnea (adult) (pediatric): Secondary | ICD-10-CM | POA: Diagnosis not present

## 2022-10-08 DIAGNOSIS — R051 Acute cough: Secondary | ICD-10-CM | POA: Diagnosis not present

## 2022-10-08 DIAGNOSIS — R918 Other nonspecific abnormal finding of lung field: Secondary | ICD-10-CM | POA: Diagnosis not present

## 2022-10-16 ENCOUNTER — Other Ambulatory Visit: Payer: Self-pay | Admitting: Cardiology

## 2022-10-16 DIAGNOSIS — I5032 Chronic diastolic (congestive) heart failure: Secondary | ICD-10-CM

## 2022-10-19 ENCOUNTER — Other Ambulatory Visit: Payer: Self-pay | Admitting: Cardiology

## 2022-10-19 ENCOUNTER — Other Ambulatory Visit: Payer: Self-pay | Admitting: Internal Medicine

## 2022-10-19 DIAGNOSIS — I5022 Chronic systolic (congestive) heart failure: Secondary | ICD-10-CM

## 2022-10-19 DIAGNOSIS — E7800 Pure hypercholesterolemia, unspecified: Secondary | ICD-10-CM

## 2022-10-19 DIAGNOSIS — E119 Type 2 diabetes mellitus without complications: Secondary | ICD-10-CM

## 2022-10-19 DIAGNOSIS — E781 Pure hyperglyceridemia: Secondary | ICD-10-CM

## 2022-10-19 DIAGNOSIS — Z789 Other specified health status: Secondary | ICD-10-CM

## 2022-10-29 DIAGNOSIS — R059 Cough, unspecified: Secondary | ICD-10-CM | POA: Diagnosis not present

## 2022-10-29 DIAGNOSIS — J45909 Unspecified asthma, uncomplicated: Secondary | ICD-10-CM | POA: Diagnosis not present

## 2022-10-29 DIAGNOSIS — I5030 Unspecified diastolic (congestive) heart failure: Secondary | ICD-10-CM | POA: Diagnosis not present

## 2022-10-29 DIAGNOSIS — I2510 Atherosclerotic heart disease of native coronary artery without angina pectoris: Secondary | ICD-10-CM | POA: Diagnosis not present

## 2022-10-29 DIAGNOSIS — K219 Gastro-esophageal reflux disease without esophagitis: Secondary | ICD-10-CM | POA: Diagnosis not present

## 2022-10-29 DIAGNOSIS — J309 Allergic rhinitis, unspecified: Secondary | ICD-10-CM | POA: Diagnosis not present

## 2022-11-03 DIAGNOSIS — Z79899 Other long term (current) drug therapy: Secondary | ICD-10-CM | POA: Diagnosis not present

## 2022-11-03 DIAGNOSIS — J4531 Mild persistent asthma with (acute) exacerbation: Secondary | ICD-10-CM | POA: Diagnosis not present

## 2022-11-03 DIAGNOSIS — R918 Other nonspecific abnormal finding of lung field: Secondary | ICD-10-CM | POA: Diagnosis not present

## 2022-11-03 DIAGNOSIS — J019 Acute sinusitis, unspecified: Secondary | ICD-10-CM | POA: Diagnosis not present

## 2022-11-03 DIAGNOSIS — G4733 Obstructive sleep apnea (adult) (pediatric): Secondary | ICD-10-CM | POA: Diagnosis not present

## 2022-11-03 DIAGNOSIS — R051 Acute cough: Secondary | ICD-10-CM | POA: Diagnosis not present

## 2022-11-03 DIAGNOSIS — R0600 Dyspnea, unspecified: Secondary | ICD-10-CM | POA: Diagnosis not present

## 2022-11-03 DIAGNOSIS — R5383 Other fatigue: Secondary | ICD-10-CM | POA: Diagnosis not present

## 2022-11-04 DIAGNOSIS — R051 Acute cough: Secondary | ICD-10-CM | POA: Diagnosis not present

## 2022-11-07 DIAGNOSIS — H401123 Primary open-angle glaucoma, left eye, severe stage: Secondary | ICD-10-CM | POA: Diagnosis not present

## 2022-11-07 DIAGNOSIS — H43813 Vitreous degeneration, bilateral: Secondary | ICD-10-CM | POA: Diagnosis not present

## 2022-11-07 DIAGNOSIS — G4733 Obstructive sleep apnea (adult) (pediatric): Secondary | ICD-10-CM | POA: Diagnosis not present

## 2022-11-07 DIAGNOSIS — E119 Type 2 diabetes mellitus without complications: Secondary | ICD-10-CM | POA: Diagnosis not present

## 2022-11-07 DIAGNOSIS — H04123 Dry eye syndrome of bilateral lacrimal glands: Secondary | ICD-10-CM | POA: Diagnosis not present

## 2022-11-17 DIAGNOSIS — G4733 Obstructive sleep apnea (adult) (pediatric): Secondary | ICD-10-CM | POA: Diagnosis not present

## 2022-11-18 DIAGNOSIS — R051 Acute cough: Secondary | ICD-10-CM | POA: Diagnosis not present

## 2022-11-19 DIAGNOSIS — M5136 Other intervertebral disc degeneration, lumbar region: Secondary | ICD-10-CM | POA: Diagnosis not present

## 2022-11-19 DIAGNOSIS — I950 Idiopathic hypotension: Secondary | ICD-10-CM | POA: Diagnosis not present

## 2022-11-19 DIAGNOSIS — Z79899 Other long term (current) drug therapy: Secondary | ICD-10-CM | POA: Diagnosis not present

## 2022-11-19 DIAGNOSIS — M1A09X0 Idiopathic chronic gout, multiple sites, without tophus (tophi): Secondary | ICD-10-CM | POA: Diagnosis not present

## 2022-11-19 DIAGNOSIS — E669 Obesity, unspecified: Secondary | ICD-10-CM | POA: Diagnosis not present

## 2022-11-19 DIAGNOSIS — R053 Chronic cough: Secondary | ICD-10-CM | POA: Diagnosis not present

## 2022-11-19 DIAGNOSIS — M0609 Rheumatoid arthritis without rheumatoid factor, multiple sites: Secondary | ICD-10-CM | POA: Diagnosis not present

## 2022-11-19 DIAGNOSIS — Z6839 Body mass index (BMI) 39.0-39.9, adult: Secondary | ICD-10-CM | POA: Diagnosis not present

## 2022-11-21 DIAGNOSIS — M0609 Rheumatoid arthritis without rheumatoid factor, multiple sites: Secondary | ICD-10-CM | POA: Diagnosis not present

## 2022-11-26 DIAGNOSIS — I7121 Aneurysm of the ascending aorta, without rupture: Secondary | ICD-10-CM | POA: Diagnosis not present

## 2022-11-26 DIAGNOSIS — K7460 Unspecified cirrhosis of liver: Secondary | ICD-10-CM | POA: Diagnosis not present

## 2022-11-26 DIAGNOSIS — J432 Centrilobular emphysema: Secondary | ICD-10-CM | POA: Diagnosis not present

## 2022-11-26 DIAGNOSIS — R0600 Dyspnea, unspecified: Secondary | ICD-10-CM | POA: Diagnosis not present

## 2022-11-26 DIAGNOSIS — I5032 Chronic diastolic (congestive) heart failure: Secondary | ICD-10-CM | POA: Diagnosis not present

## 2022-11-26 DIAGNOSIS — R140 Abdominal distension (gaseous): Secondary | ICD-10-CM | POA: Diagnosis not present

## 2022-11-28 DIAGNOSIS — K760 Fatty (change of) liver, not elsewhere classified: Secondary | ICD-10-CM | POA: Diagnosis not present

## 2022-11-28 DIAGNOSIS — R140 Abdominal distension (gaseous): Secondary | ICD-10-CM | POA: Diagnosis not present

## 2022-11-28 DIAGNOSIS — R918 Other nonspecific abnormal finding of lung field: Secondary | ICD-10-CM | POA: Diagnosis not present

## 2022-11-28 DIAGNOSIS — G4733 Obstructive sleep apnea (adult) (pediatric): Secondary | ICD-10-CM | POA: Diagnosis not present

## 2022-11-28 DIAGNOSIS — R5383 Other fatigue: Secondary | ICD-10-CM | POA: Diagnosis not present

## 2022-11-28 DIAGNOSIS — J4531 Mild persistent asthma with (acute) exacerbation: Secondary | ICD-10-CM | POA: Diagnosis not present

## 2022-11-30 ENCOUNTER — Other Ambulatory Visit: Payer: Self-pay

## 2022-11-30 ENCOUNTER — Emergency Department (HOSPITAL_COMMUNITY): Payer: PPO

## 2022-11-30 ENCOUNTER — Emergency Department (HOSPITAL_COMMUNITY)
Admission: EM | Admit: 2022-11-30 | Discharge: 2022-11-30 | Disposition: A | Discharge status: Home / Self Care | Payer: PPO | Source: Home / Self Care | Attending: Emergency Medicine | Admitting: Emergency Medicine

## 2022-11-30 ENCOUNTER — Encounter (HOSPITAL_COMMUNITY): Payer: Self-pay

## 2022-11-30 DIAGNOSIS — J988 Other specified respiratory disorders: Secondary | ICD-10-CM

## 2022-11-30 DIAGNOSIS — R600 Localized edema: Secondary | ICD-10-CM | POA: Diagnosis not present

## 2022-11-30 DIAGNOSIS — I517 Cardiomegaly: Secondary | ICD-10-CM | POA: Diagnosis not present

## 2022-11-30 DIAGNOSIS — R051 Acute cough: Secondary | ICD-10-CM

## 2022-11-30 DIAGNOSIS — J810 Acute pulmonary edema: Secondary | ICD-10-CM | POA: Insufficient documentation

## 2022-11-30 DIAGNOSIS — R918 Other nonspecific abnormal finding of lung field: Secondary | ICD-10-CM | POA: Diagnosis not present

## 2022-11-30 DIAGNOSIS — E119 Type 2 diabetes mellitus without complications: Secondary | ICD-10-CM | POA: Diagnosis not present

## 2022-11-30 DIAGNOSIS — Z794 Long term (current) use of insulin: Secondary | ICD-10-CM | POA: Insufficient documentation

## 2022-11-30 DIAGNOSIS — Z7901 Long term (current) use of anticoagulants: Secondary | ICD-10-CM | POA: Diagnosis not present

## 2022-11-30 DIAGNOSIS — I110 Hypertensive heart disease with heart failure: Secondary | ICD-10-CM | POA: Insufficient documentation

## 2022-11-30 DIAGNOSIS — R0602 Shortness of breath: Secondary | ICD-10-CM | POA: Diagnosis not present

## 2022-11-30 DIAGNOSIS — J069 Acute upper respiratory infection, unspecified: Secondary | ICD-10-CM | POA: Diagnosis not present

## 2022-11-30 DIAGNOSIS — Z1152 Encounter for screening for COVID-19: Secondary | ICD-10-CM | POA: Insufficient documentation

## 2022-11-30 DIAGNOSIS — Z79899 Other long term (current) drug therapy: Secondary | ICD-10-CM | POA: Insufficient documentation

## 2022-11-30 DIAGNOSIS — I509 Heart failure, unspecified: Secondary | ICD-10-CM | POA: Insufficient documentation

## 2022-11-30 LAB — COMPREHENSIVE METABOLIC PANEL
ALT: 34 U/L (ref 0–44)
AST: 32 U/L (ref 15–41)
Albumin: 3.5 g/dL (ref 3.5–5.0)
Alkaline Phosphatase: 77 U/L (ref 38–126)
Anion gap: 9 (ref 5–15)
BUN: 6 mg/dL — ABNORMAL LOW (ref 8–23)
CO2: 25 mmol/L (ref 22–32)
Calcium: 8.8 mg/dL — ABNORMAL LOW (ref 8.9–10.3)
Chloride: 100 mmol/L (ref 98–111)
Creatinine, Ser: 0.98 mg/dL (ref 0.61–1.24)
GFR, Estimated: >60 mL/min (ref >60)
Glucose, Bld: 204 mg/dL — ABNORMAL HIGH (ref 70–99)
Potassium: 3 mmol/L — ABNORMAL LOW (ref 3.5–5.1)
Sodium: 134 mmol/L — ABNORMAL LOW (ref 135–145)
Total Bilirubin: 0.7 mg/dL (ref 0.3–1.2)
Total Protein: 7 g/dL (ref 6.5–8.1)

## 2022-11-30 LAB — RESP PANEL BY RT-PCR (RSV, FLU A&B, COVID)  RVPGX2
Influenza A by PCR: NEGATIVE
Influenza B by PCR: NEGATIVE
Resp Syncytial Virus by PCR: NEGATIVE
SARS Coronavirus 2 by RT PCR: NEGATIVE

## 2022-11-30 LAB — CBC
HCT: 40 % (ref 39.0–52.0)
Hemoglobin: 14.2 g/dL (ref 13.0–17.0)
MCH: 31.4 pg (ref 26.0–34.0)
MCHC: 35.5 g/dL (ref 30.0–36.0)
MCV: 88.5 fL (ref 80.0–100.0)
Platelets: 234 10*3/uL (ref 150–400)
RBC: 4.52 MIL/uL (ref 4.22–5.81)
RDW: 14.1 % (ref 11.5–15.5)
WBC: 6.9 10*3/uL (ref 4.0–10.5)
nRBC: 0 % (ref 0.0–0.2)

## 2022-11-30 LAB — D-DIMER, QUANTITATIVE: D-Dimer, Quant: <0.27 ug/mL-FEU (ref 0.00–0.50)

## 2022-11-30 LAB — BRAIN NATRIURETIC PEPTIDE: B Natriuretic Peptide: 24.9 pg/mL (ref 0.0–100.0)

## 2022-11-30 MED ORDER — POTASSIUM CHLORIDE CRYS ER 20 MEQ PO TBCR
60.0000 meq | EXTENDED_RELEASE_TABLET | Freq: Once | ORAL | Status: AC
  Administered 2022-11-30: 60 meq via ORAL
  Filled 2022-11-30: qty 3

## 2022-11-30 MED ORDER — POTASSIUM CHLORIDE CRYS ER 20 MEQ PO TBCR: 20.0000 meq | EXTENDED_RELEASE_TABLET | Freq: Two times a day (BID) | ORAL | 0 refills | Status: DC

## 2022-11-30 MED ORDER — FUROSEMIDE 10 MG/ML IJ SOLN
40.0000 mg | Freq: Once | INTRAMUSCULAR | Status: AC
  Administered 2022-11-30: 40 mg via INTRAVENOUS
  Filled 2022-11-30: qty 4

## 2022-11-30 NOTE — ED Provider Notes (Signed)
[place] EMERGENCY DEPARTMENT AT [facility] Provider Note   CSN: [id number] Arrival date & time: 11/30/22  1657     History  Chief Complaint  Patient presents with   Shortness of Breath   Cough    Joshua Lara is a 77 y.o. male.  77 year old male with a history of CHF (45 to 50%), atrial fibrillation on Eliquis, diabetes, hypertension, hyperlipidemia, ascending aortic aneurysm, and OSA on CPAP who presents emergency department with shortness of breath.  Says that for the past 2 to 3 months has been having cough productive of white sputum.  Says that he has had gradually worsening shortness of breath.  Also has bilateral lower extremity edema and increased weight gain.  Has PND as well.  Was started on Lasix last Wednesday and has been having good urine output with that but says his cough has persisted and become very bothersome so he came into the emergency department today.  Also started on antibiotics on Friday.  Unsure exactly which antibiotic she was started on.  No significant chest pain.  No fevers.  So they were also considering putting him on oxygen at home recently.         Home Medications Prior to Admission medications   Medication Sig Start Date End Date Taking? Authorizing Provider  potassium chloride SA (KLOR-CON M) 20 MEQ tablet Take 1 tablet (20 mEq total) by mouth 2 (two) times daily for 7 days. 11/30/22 12/07/22 Yes Rondel Baton, MD  acetaminophen (TYLENOL) 650 MG CR tablet Take 650 mg by mouth every 8 (eight) hours as needed for pain.    [provider]  albuterol (VENTOLIN HFA) 108 (90 Base) MCG/ACT inhaler Inhale into the lungs every 6 (six) hours as needed for wheezing or shortness of breath.    [provider]  apixaban (ELIQUIS) 5 MG TABS tablet TAKE 1 TABLET BY MOUTH EVERY 12 HOURS 06/20/22   Tolia, Sunit, DO  BD PEN NEEDLE NANO 2ND GEN 32G X 4 MM MISC  11/22/19   [provider]  cyanocobalamin (VITAMIN B12) 500 MCG  tablet Take 500 mcg by mouth daily.    [provider]  dorzolamide-timolol (COSOPT) 22.3-6.8 MG/ML ophthalmic solution 1 drop 2 (two) times daily.    [provider]  Evolocumab (REPATHA SURECLICK) 140 MG/ML SOAJ INJECT 140 MG INTO THE SKIN EVERY 14 (FOURTEEN) DAYS 10/20/22   Tolia, Sunit, DO  fenofibrate (TRICOR) 145 MG tablet TAKE 1 TABLET BY MOUTH EVERY DAY 10/20/22   Tolia, Sunit, DO  fluticasone (FLONASE) 50 MCG/ACT nasal spray Place into both nostrils daily.    [provider]  folic acid (FOLVITE) 1 MG tablet Take 1 mg by mouth daily. 09/05/19   [provider]  HUMULIN R U-500 KWIKPEN 500 UNIT/ML kwikpen Inject 125-150 Units into the skin 2 (two) times daily.  04/12/19   [provider]  latanoprost (XALATAN) 0.005 % ophthalmic solution 1 drop at bedtime.    [provider]  levothyroxine (SYNTHROID, LEVOTHROID) 100 MCG tablet Take 100 mcg by mouth daily.    [provider]  methotrexate 2.5 MG tablet Take 5 mg by mouth once a week. 5mg  in Morning, 5mg  at night    [provider]  montelukast (SINGULAIR) 10 MG tablet Take 10 mg by mouth daily. 09/08/22   [provider]  nitroGLYCERIN (NITROSTAT) 0.4 MG SL tablet Place 1 tablet (0.4 mg total) under the tongue every 5 (five) minutes as needed for chest  pain. If you require more than two tablets five minutes apart go to the nearest ER via EMS. 11/23/20 09/12/22  Tolia, Sunit, DO  sacubitril-valsartan (ENTRESTO) 24-26 MG TAKE 1 TABLET BY MOUTH TWICE A DAY 10/17/22   Tolia, Sunit, DO  sotalol (BETAPACE) 80 MG tablet TAKE 0.5 TABLETS BY MOUTH EVERY MORNING AND TAKE 1 TABLET IN THE EVENING 06/20/22   Tolia, Sunit, DO  tamsulosin (FLOMAX) 0.4 MG CAPS capsule SMARTSIG:1 Capsule(s) By Mouth Every Evening 07/18/21   [provider]  TRULICITY 0.75 MG/0.5ML SOPN INJECT 0.75 MG ONCE A WEEK SUBCUTANEOUS 30 DAYS 09/11/19   [provider]  Vitamin D, Ergocalciferol,  (DRISDOL) 1.25 MG (50000 UNIT) CAPS capsule Take 50,000 Units by mouth once a week. 09/09/22   [provider]      Allergies    Bactrim [sulfamethoxazole-trimethoprim], Ranitidine, Ranitidine hcl, Adalimumab, Etanercept, Etanercept, Sulfa antibiotics, Sulfur, and Tape    Review of Systems   Review of Systems  Physical Exam Updated Vital Signs BP (!) 141/79   Pulse 99   Temp 99.3 F (37.4 C)   Resp (!) 22   Ht 5\' 10"  (1.778 m)   Wt 122 kg   SpO2 93%   BMI 38.59 kg/m  Physical Exam Vitals and nursing note reviewed.  Constitutional:      General: He is not in acute distress.    Appearance: He is well-developed.  HENT:     Head: Normocephalic and atraumatic.     Right Ear: External ear normal.     Left Ear: External ear normal.     Nose: Nose normal.  Eyes:     Extraocular Movements: Extraocular movements intact.     Conjunctiva/sclera: Conjunctivae normal.     Pupils: Pupils are equal, round, and reactive to light.  Cardiovascular:     Rate and Rhythm: Normal rate. Rhythm irregular.     Heart sounds: Normal heart sounds.  Pulmonary:     Effort: Pulmonary effort is normal. No respiratory distress.     Breath sounds: Normal breath sounds.     Comments: Limited due to habitus.  Coughing frequently during exam. Abdominal:     General: There is no distension.     Palpations: Abdomen is soft. There is no mass.     Tenderness: There is no abdominal tenderness. There is no guarding.  Musculoskeletal:     Cervical back: Normal range of motion and neck supple.     Right lower leg: Edema present.     Left lower leg: Edema present.  Skin:    General: Skin is warm and dry.  Neurological:     Mental Status: He is alert. Mental status is at baseline.  Psychiatric:        Mood and Affect: Mood normal.        Behavior: Behavior normal.     ED Results / Procedures / Treatments   Labs (all labs ordered are listed, but only abnormal results are displayed) Labs  Reviewed  COMPREHENSIVE METABOLIC PANEL - Abnormal; Notable for the following components:      Result Value   Sodium 134 (*)    Potassium 3.0 (*)    Glucose, Bld 204 (*)    BUN 6 (*)    Calcium 8.8 (*)    All other components within normal limits  RESP PANEL BY RT-PCR (RSV, FLU A&B, COVID)  RVPGX2  CBC  BRAIN NATRIURETIC PEPTIDE  D-DIMER, QUANTITATIVE    EKG EKG Interpretation Date/Time:  Sunday  November 30 2022 16:52:38 EDT Ventricular Rate:  101 PR Interval:  216 QRS Duration:  84 QT Interval:  352 QTC Calculation: 456 R Axis:   120  Text Interpretation: Sinus tachycardia with 1st degree A-V block Low voltage QRS Left posterior fascicular block Cannot rule out Anterior infarct , age undetermined Abnormal ECG When compared with ECG of 16-Jun-2022 17:56, PREVIOUS ECG IS PRESENT Confirmed by Vonita Moss [phone]) on 11/30/2022 7:02:09 PM  Radiology DG Chest Port 1 View  Result Date: 11/30/2022 CLINICAL DATA:  Shortness of breath EXAM: PORTABLE CHEST 1 VIEW COMPARISON:  Chest x-ray 06/16/2022 FINDINGS: The heart is enlarged. There some interstitial opacities in both lung bases. There is a questionable small left pleural effusion. There is no pneumothorax or acute fracture. IMPRESSION: Cardiomegaly. Interstitial opacities in both lung bases may represent pulmonary edema or atypical infection. Possible small left pleural effusion. Electronically Signed   By: Darliss Cheney M.D.   On: 11/30/2022 17:56    Procedures Procedures    Medications Ordered in ED Medications  potassium chloride SA (KLOR-CON M) CR tablet 60 mEq (60 mEq Oral Given 11/30/22 1841)  furosemide (LASIX) injection 40 mg (40 mg Intravenous Given 11/30/22 1849)    ED Course/ Medical Decision Making/ A&P                             Medical Decision Making Amount and/or Complexity of Data Reviewed Labs: ordered. Radiology: ordered.  Risk Prescription drug management.   JIRO KIESTER is a 78 y.o. male with  comorbidities that complicate the patient evaluation including CHF (45 to 50%), atrial fibrillation on Eliquis, diabetes, hypertension, hyperlipidemia, ascending aortic aneurysm, and OSA on CPAP who presents emergency department with shortness of breath and cough  Initial Ddx:  Pneumonia, heart failure exacerbation, asthma/COPD, URI, PE  MDM/Course:  Patient presents to the emergency department with shortness of breath and cough with lower extremity swelling and weight gain.  Is already been started on antibiotics for possible pneumonia.  After discussing with the son it appears that he is on Levaquin.  On exam is currently on room air no abnormal lung sounds but this is limited due to habitus and he does have some lower extremity edema.  Initially was concern for heart failure and so did have a chest x-ray that showed possible pulmonary edema versus atypical infection.  His COVID and flu were WNL.  His BNP was 24.  No signs of an AKI on his lab work.  Was given IV Lasix and had good urine output.  Was also given potassium since his potassium was 3.0 today.  Had a D-dimer that was undetectably low making PE less likely.  Upon re-evaluation patient remained stable on room air though mildly dyspneic.  Performed shared decision-making with the patient and the son since he is already on Levaquin which would be an adequate antibiotic and is still making good urine output with his Lasix at home.  They preferred to wait a few more days to give the medications more time to work and to follow-up with his primary doctor on Wednesday.  Was given a work note until then.  If he worsens or is not doing better at his appointment on Wednesday recommended that he come back to the emergency department for possible admission for IV diuresis and/or antibiotics.  This patient presents to the ED for concern of complaints listed in HPI, this involves an extensive number of treatment  options, and is a complaint that carries with it  a high risk of complications and morbidity. Disposition including potential need for admission considered.   Dispo: DC Home. Return precautions discussed including, but not limited to, those listed in the AVS. Allowed pt time to ask questions which were answered fully prior to dc.  Additional history obtained from son Records reviewed Outpatient Clinic Notes The following labs were independently interpreted: Chemistry and show no acute abnormality I independently reviewed the following imaging with scope of interpretation limited to determining acute life threatening conditions related to emergency care: Chest x-ray and agree with the radiologist interpretation with the following exceptions: none I personally reviewed and interpreted cardiac monitoring: normal sinus rhythm  I personally reviewed and interpreted the pt's EKG: see above for interpretation  I have reviewed the patients home medications and made adjustments as needed Social Determinants of health:  Elderly         Final Clinical Impression(s) / ED Diagnoses Final diagnoses:  Acute cough  Shortness of breath  Acute pulmonary edema (HCC)  Respiratory infection    Rx / DC Orders ED Discharge Orders          Ordered    potassium chloride SA (KLOR-CON M) 20 MEQ tablet  2 times daily        11/30/22 1950              Rondel Baton, MD 11/30/22 2003

## 2022-11-30 NOTE — ED Triage Notes (Signed)
Pt c/o productive cough w/white mucous and [id number]. Pt states has been to pulmonology and they have ran tests and can't figure out what is going on with him.

## 2022-11-30 NOTE — Discharge Instructions (Addendum)
You were seen for your cough and shortness of breath in the emergency department.   At home, please continue taking the antibiotics (Levaquin) and the fluid pill (Lasix) that you were prescribed.  Your potassium was low likely from the Lasix so please take the potassium that we have prescribed you every day that you take the Lasix.  You may stop this medication when he stopped taking the Lasix.  Check your MyChart online for the results of any tests that had not resulted by the time you left the emergency department.   Follow-up with your primary doctor in 2-3 days regarding your visit.    Return immediately to the emergency department if you experience any of the following: Difficulty breathing, or any other concerning symptoms.    Thank you for visiting our Emergency Department. It was a pleasure taking care of you today.

## 2022-12-01 ENCOUNTER — Telehealth: Payer: Self-pay

## 2022-12-01 NOTE — Telephone Encounter (Signed)
Transition Care Management Unsuccessful Follow-up Telephone Call  Date of discharge and from where:  11/30/2022 [facility]  Attempts:  1st Attempt  Reason for unsuccessful TCM follow-up call:  Left voice message  Hanford Lust [facility]  [facility] Population Health Community Resource Care Guide   [email or web address]  ?? [phone]   Website: [email or web address]  [email or web address]

## 2022-12-02 ENCOUNTER — Telehealth: Payer: Self-pay

## 2022-12-02 NOTE — Telephone Encounter (Signed)
Transition Care Management Follow-up Telephone Call Date of discharge and from where: 11/30/2022 [facility] How have you been since you were released from the hospital? Patient is feeling a little better but has frequent cough. Any questions or concerns? No  Items Reviewed: Did the pt receive and understand the discharge instructions provided? Yes  Medications obtained and verified? Yes  Other? No  Any new allergies since your discharge? No  Dietary orders reviewed? Yes Do you have support at home? Yes   Follow up appointments reviewed:  PCP Hospital f/u appt confirmed? Yes  Scheduled to see Joshua Peak, PA-C on 12/02/2022 @ . Specialist Hospital f/u appt confirmed? No  Scheduled to see  on  @ . Are transportation arrangements needed? No  If their condition worsens, is the pt aware to call PCP or go to the Emergency Dept.? Yes Was the patient provided with contact information for the PCP's office or ED? Yes Was to pt encouraged to call back with questions or concerns? Yes  Joshua Lara [facility]  [facility] Population Health Community Resource Care Guide   [email or web address]  ?? [phone]   Website: [email or web address]  [email or web address]

## 2022-12-04 ENCOUNTER — Encounter: Payer: Self-pay | Admitting: Cardiology

## 2022-12-04 ENCOUNTER — Ambulatory Visit: Payer: PPO | Admitting: Cardiology

## 2022-12-04 VITALS — BP 148/88 | HR 108 | Resp 16 | Ht 70.0 in | Wt 271.2 lb

## 2022-12-04 DIAGNOSIS — I480 Paroxysmal atrial fibrillation: Secondary | ICD-10-CM | POA: Diagnosis not present

## 2022-12-04 DIAGNOSIS — I7121 Aneurysm of the ascending aorta, without rupture: Secondary | ICD-10-CM

## 2022-12-04 DIAGNOSIS — J810 Acute pulmonary edema: Secondary | ICD-10-CM | POA: Diagnosis not present

## 2022-12-04 DIAGNOSIS — E781 Pure hyperglyceridemia: Secondary | ICD-10-CM

## 2022-12-04 DIAGNOSIS — E1129 Type 2 diabetes mellitus with other diabetic kidney complication: Secondary | ICD-10-CM | POA: Diagnosis not present

## 2022-12-04 DIAGNOSIS — Z794 Long term (current) use of insulin: Secondary | ICD-10-CM | POA: Diagnosis not present

## 2022-12-04 DIAGNOSIS — I5020 Unspecified systolic (congestive) heart failure: Secondary | ICD-10-CM

## 2022-12-04 DIAGNOSIS — R0609 Other forms of dyspnea: Secondary | ICD-10-CM | POA: Diagnosis not present

## 2022-12-04 DIAGNOSIS — Z789 Other specified health status: Secondary | ICD-10-CM

## 2022-12-04 DIAGNOSIS — I5032 Chronic diastolic (congestive) heart failure: Secondary | ICD-10-CM | POA: Diagnosis not present

## 2022-12-04 DIAGNOSIS — G4733 Obstructive sleep apnea (adult) (pediatric): Secondary | ICD-10-CM

## 2022-12-04 DIAGNOSIS — R809 Proteinuria, unspecified: Secondary | ICD-10-CM | POA: Diagnosis not present

## 2022-12-04 DIAGNOSIS — Z79899 Other long term (current) drug therapy: Secondary | ICD-10-CM

## 2022-12-04 DIAGNOSIS — Z7901 Long term (current) use of anticoagulants: Secondary | ICD-10-CM

## 2022-12-04 DIAGNOSIS — E876 Hypokalemia: Secondary | ICD-10-CM | POA: Diagnosis not present

## 2022-12-04 DIAGNOSIS — E7800 Pure hypercholesterolemia, unspecified: Secondary | ICD-10-CM

## 2022-12-04 MED ORDER — SPIRONOLACTONE 25 MG PO TABS
25.0000 mg | ORAL_TABLET | Freq: Every morning | ORAL | 3 refills | Status: DC
Start: 2022-12-04 — End: 2022-12-09

## 2022-12-04 MED ORDER — LOSARTAN POTASSIUM 25 MG PO TABS
25.0000 mg | ORAL_TABLET | Freq: Every evening | ORAL | 0 refills | Status: DC
Start: 2022-12-04 — End: 2022-12-09

## 2022-12-04 NOTE — Progress Notes (Signed)
Joshua Lara Date of Birth: 01-18-1946 MRN: [id number] Primary Care Provider:[name], Aldean Jewett Former Cardiology Providers: Altamese Dixon, APRN, FNP-C Primary Cardiologist: Tessa Lerner, DO, [facility] (established care 10/28/2019)  Date: 12/04/22 Last Office Visit: 09/12/2022  Chief Complaint  Patient presents with   Shortness of Breath   Follow-up    HPI  Joshua Lara is a 77 y.o.  male whose past medical history and cardiovascular risk factors include: Paroxysmal atrial fibrillation, oral anticoagulation, long-term antiarrhythmic medication, recovered cardiomyopathy w/ HFimpEF, ascending aortic aneurysm 42 mm (CT Chest 07/04/2022), hypertension, hyperlipidemia, OSA on BiPAP, diabetes mellitus, glaucoma, obesity due to excess calories, advanced age.  Patient is being followed by the practice for paroxysmal atrial fibrillation, heart failure with improved EF, and hyperlipidemia.  In the recent past he has been having difficulty managing his dyspnea which is getting progressively worse and now presents on a daily basis.  Since last office visit he went to the ED on November 30, 2022 for the same reasons.  BNP was within normal limits.  D-dimer is within normal limits.  Was given IV Lasix which facilitated diuresis was given potassium supplements.  Prior to the ED visit he was already started on Levaquin for pneumonia.  In the recent past he also stopped Entresto 24/26 mg p.o. twice daily due to concerns for nonproductive cough.  However despite stopping Arni patient stated that the cough continued and in fact became productive and that is why he was started on Levaquin.  He presents to the office for further guidance with regards to his dyspnea.  He is also been evaluated by pulmonary medicine and will be started on nocturnal oxygen along with the BiPAP therapy.  He denies anginal chest pain, orthopnea, PND, or lower extremity swelling.  During the prior office visits patient states that  when he was started on montelukast by pulmonary medicine his breathing had improved.  He is on methotrexate which raises a concern for possible pulmonary toxicity.  He follows up with rheumatology and pulmonary medicine in the coming weeks.  He continues to experience lightheaded and dizziness with changing positions quickly.  He did bring his medication bottles today for review and reconciliation.  ALLERGIES: Allergies  Allergen Reactions   Bactrim [Sulfamethoxazole-Trimethoprim] Swelling   Ranitidine Anaphylaxis   Ranitidine Hcl Anaphylaxis and Other (See Comments)    Has anaphylactic shock--intubated and in ICU x 4 days   Adalimumab Other (See Comments)   Etanercept Other (See Comments) and Cough    (ENBREL); Patient thinks it contributed to heart issues   Etanercept     Other reaction(s): Cough (ALLERGY/intolerance), Other (See Comments) (ENBREL); Patient thinks it contributed to heart issues   Sulfa Antibiotics Other (See Comments)   Sulfur Other (See Comments)   Tape Itching    Can only tolerate bandages for less than 24-hr periods Can only tolerate bandages for less than 24-hr periods    MEDICATION LIST PRIOR TO VISIT: Current Outpatient Medications on File Prior to Visit  Medication Sig Dispense Refill   acetaminophen (TYLENOL) 650 MG CR tablet Take 650 mg by mouth every 8 (eight) hours as needed for pain.     albuterol (VENTOLIN HFA) 108 (90 Base) MCG/ACT inhaler Inhale into the lungs every 6 (six) hours as needed for wheezing or shortness of breath.     apixaban (ELIQUIS) 5 MG TABS tablet TAKE 1 TABLET BY MOUTH EVERY 12 HOURS 60 tablet 10   BD Lara NEEDLE NANO 2ND GEN 32G X 4 MM  MISC      cyanocobalamin (VITAMIN B12) 500 MCG tablet Take 500 mcg by mouth daily.     dorzolamide-timolol (COSOPT) 22.3-6.8 MG/ML ophthalmic solution 1 drop 2 (two) times daily.     Evolocumab (REPATHA SURECLICK) 140 MG/ML SOAJ INJECT 140 MG INTO THE SKIN EVERY 14 (FOURTEEN) DAYS 2 mL 1    fenofibrate (TRICOR) 145 MG tablet TAKE 1 TABLET BY MOUTH EVERY DAY 90 tablet 1   fluticasone (FLONASE) 50 MCG/ACT nasal spray Place into both nostrils daily.     folic acid (FOLVITE) 1 MG tablet Take 1 mg by mouth daily.     HUMULIN R U-500 KWIKPEN 500 UNIT/ML kwikpen Inject 125-150 Units into the skin 2 (two) times daily.      latanoprost (XALATAN) 0.005 % ophthalmic solution 1 drop at bedtime.     levothyroxine (SYNTHROID, LEVOTHROID) 100 MCG tablet Take 100 mcg by mouth daily.     methotrexate 2.5 MG tablet Take 5 mg by mouth once a week. 5mg  in Morning, 5mg  at night     montelukast (SINGULAIR) 10 MG tablet Take 10 mg by mouth daily.     potassium chloride SA (KLOR-CON M) 20 MEQ tablet Take 1 tablet (20 mEq total) by mouth 2 (two) times daily for 7 days. 14 tablet 0   sotalol (BETAPACE) 80 MG tablet TAKE 0.5 TABLETS BY MOUTH EVERY MORNING AND TAKE 1 TABLET IN THE EVENING 135 tablet 3   tamsulosin (FLOMAX) 0.4 MG CAPS capsule SMARTSIG:1 Capsule(s) By Mouth Every Evening     TRULICITY 0.75 MG/0.5ML SOPN INJECT 0.75 MG ONCE A WEEK SUBCUTANEOUS 30 DAYS     nitroGLYCERIN (NITROSTAT) 0.4 MG SL tablet Place 1 tablet (0.4 mg total) under the tongue every 5 (five) minutes as needed for chest pain. If you require more than two tablets five minutes apart go to the nearest ER via EMS. 30 tablet 0   Vitamin D, Ergocalciferol, (DRISDOL) 1.25 MG (50000 UNIT) CAPS capsule Take 50,000 Units by mouth once a week. (Patient not taking: Reported on 12/04/2022)     No current facility-administered medications on file prior to visit.    PAST MEDICAL HISTORY: Past Medical History:  Diagnosis Date   Arthritis    "hands, back, ankles" (05/11/2014)   CHF (congestive heart failure) (HCC)    Chronic lower back pain    Compression fracture of lumbar vertebra (HCC)    Coronary artery disease    Daily headache    "recently" (05/11/2014)   Depression    "I might be slight" (05/11/2014)   GERD (gastroesophageal  reflux disease)    High cholesterol    Hypertension    Hypothyroid    OSA treated with BiPAP    Pneumonia    "couple times; last time was 03/2011" (05/11/2014)   Type II diabetes mellitus (HCC)     PAST SURGICAL HISTORY: Past Surgical History:  Procedure Laterality Date   APPENDECTOMY     CARDIAC CATHETERIZATION  2001; 2009   CATARACT EXTRACTION W/ INTRAOCULAR LENS IMPLANT Left    CHOLECYSTECTOMY  1980's   Hattie Perch 07/13/2010    EYE MUSCLE SURGERY Left    LEFT AND RIGHT HEART CATHETERIZATION WITH CORONARY ANGIOGRAM N/A 05/30/2014   Procedure: LEFT AND RIGHT HEART CATHETERIZATION WITH CORONARY ANGIOGRAM;  Surgeon: Dolores Patty, MD;  Location: [facility] CATH LAB;  Service: Cardiovascular;  Laterality: N/A;   LEFT HEART CATH AND CORONARY ANGIOGRAPHY N/A 12/20/2018   Procedure: LEFT HEART CATH AND CORONARY ANGIOGRAPHY and possible intervention;  Surgeon: [name], MD;  Location: [facility] INVASIVE CV LAB;  Service: Cardiovascular;  Laterality: N/A;   MASTOIDECTOMY Right 1970's   Hattie Perch 07/13/2010   SHOULDER OPEN ROTATOR CUFF REPAIR Left 07/2010   SPHINCTEROTOMY     TONSILLECTOMY AND ADENOIDECTOMY      FAMILY HISTORY: The patient's family history includes Esophageal cancer in his brother; Heart attack in his father; Kidney failure in his mother.   SOCIAL HISTORY:  The patient  reports that he quit smoking about 32 years ago. His smoking use included cigarettes. He started smoking about 62 years ago. He has a 60 pack-year smoking history. He has never used smokeless tobacco. He reports that he does not currently use alcohol. He reports that he does not use drugs.  Review of Systems  Constitutional: Positive for malaise/fatigue. Negative for chills and fever.  HENT:  Negative for hoarse voice and nosebleeds.   Eyes:  Negative for discharge, double vision and pain.  Cardiovascular:  Positive for dyspnea on exertion (chronic). Negative for chest pain, claudication, leg swelling, near-syncope,  orthopnea, palpitations, paroxysmal nocturnal dyspnea and syncope.  Respiratory:  Positive for shortness of breath. Negative for hemoptysis.   Musculoskeletal:  Negative for muscle cramps and myalgias.  Gastrointestinal:  Negative for abdominal pain, constipation, diarrhea, hematemesis, hematochezia, melena, nausea and vomiting.  Neurological:  Positive for dizziness and light-headedness. Negative for vertigo.    PHYSICAL EXAM:    12/04/2022    2:10 PM 11/30/2022    8:00 PM 11/30/2022    7:30 PM  Vitals with BMI  Height 5\' 10"     Weight 271 lbs 3 oz    BMI 38.91    Systolic 148 114 811  Diastolic 88 97 79  Pulse 108 106 99    Physical Exam Cardiovascular:     Rate and Rhythm: Normal rate and regular rhythm.     Pulses:          Carotid pulses are 2+ on the right side and 2+ on the left side.      Radial pulses are 2+ on the right side and 2+ on the left side.       Dorsalis pedis pulses are 2+ on the right side and 2+ on the left side.       Posterior tibial pulses are 1+ on the right side and 1+ on the left side.     Heart sounds: Normal heart sounds. No murmur heard.    No gallop.  Pulmonary:     Effort: Pulmonary effort is normal.     Breath sounds: Normal breath sounds. No wheezing or rales.  Musculoskeletal:     Right lower leg: No edema.     Left lower leg: No edema.  Neurological:     Mental Status: He is alert.    CARDIAC DATABASE: EKG:  Sep 12, 2022: Sinus rhythm, 76 bpm, first-degree AV block, without underlying injury pattern.  Echocardiogram: 01/14/2019: LVEF 45-50%, calculated LVEF 40%, grade 1 diastolic impairment, mild LAE, see report for details.   12/09/2019: EF 45-50%. Indeterminate diastolic filling pattern, elevated LAP. See report for details.   04/11/2022: LVEF 60 to 65%, mild LVH, no significant valvular heart disease.  See report for additional details.  04/26/024:   1. Left ventricular ejection fraction, by estimation, is 60 to 65%. The left  ventricle has normal function. The left ventricle has no regional wall motion abnormalities. There is mild left ventricular hypertrophy. Left ventricular diastolic parameters are consistent with Grade I diastolic  dysfunction (impaired relaxation).   2. Right ventricular systolic function is normal. The right ventricular size is normal.   3. The mitral valve is normal in structure. No evidence of mitral valve regurgitation. No evidence of mitral stenosis.   4. The aortic valve is tricuspid. There is mild calcification of the aortic valve. There is mild thickening of the aortic valve. Aortic valve regurgitation is not visualized. Aortic valve sclerosis is present, with no evidence of aortic valve stenosis.   5. Aortic dilatation noted. There is moderate dilatation of the ascending aorta, measuring 42 mm.   6. The inferior vena cava is normal in size with greater than 50% respiratory variability, suggesting right atrial pressure of 3 mmHg.    Stress Testing:  Lexiscan/modified Bruce Tetrofosmin stress test 11/23/2019: Lexiscan/modified Bruce nuclear stress test performed using 1-day protocol. Stress EKG is non-diagnostic, as this is pharmacological stress test. In addition, stress EKG at 71% MPHR showed sinus rhythm, possible old anteroseptal infarct, poor R wave progression, low voltage, no ischemic changes.   Large areas of soft tissue attenuation seen, particularly in vertical long axis images. In addition, Apical thinning seen on both rest and stress images may be physiological. Overall, no reversible ischemia noted. Stress LVEF 49%, although visually appears normal.  Low risk study.   Heart Catheterization: 12/20/2018: Left ventricle is borderline dilated.  Mild decrease in LV systolic function with global hypokinesis, EF 45%.  Normal LVEDP. Minimal luminal irregularity, no coronary artery disease.  Right dominant circulation.   LABORATORY DATA:    Latest Ref Rng & Units 11/30/2022    5:30 PM  06/16/2022    6:27 PM 11/23/2019    2:35 PM  CBC  WBC 4.0 - 10.5 K/uL 6.9  7.3  5.5   Hemoglobin 13.0 - 17.0 g/dL 16.1  09.6  04.5   Hematocrit 39.0 - 52.0 % 40.0  41.5  44.0   Platelets 150 - 400 K/uL 234  183  167    External Labs: Collected: 06/07/2021 provided by PCP. BUN 11, creatinine 0.9. eGFR 90. Sodium 141, potassium 4, chloride 103, bicarb 27 AST 23, ALT 27, alkaline phosphatase 113 Hemoglobin 16.9 g/dL, hematocrit 40.9% BNP 8.2 Total cholesterol 173, triglycerides 259, HDL 42, LDL 88  External Labs: Collected: 08/13/2022 provided by PCP BUN 16, creatinine 1.08. BUN to creatinine ratio 15. Sodium 135, potassium 4.4, chloride 99, bicarb 23. AST 26, ALT 24, alkaline phosphatase 64 Hemoglobin 15.1, hematocrit 43.9% BNP 4.4  IMPRESSION:    ICD-10-CM   1. Chronic heart failure with preserved ejection fraction (HFpEF) (HCC)  I50.32 losartan (COZAAR) 25 MG tablet    Pro b natriuretic peptide (BNP)    Basic metabolic panel    Magnesium    spironolactone (ALDACTONE) 25 MG tablet    2. Heart failure with improved ejection fraction (HFimpEF) (HCC)  I50.32     3. Dyspnea on exertion  R06.09     4. Aneurysm of ascending aorta without rupture (HCC)  I71.21     5. Paroxysmal atrial fibrillation (HCC)  I48.0     6. Long term (current) use of anticoagulants  Z79.01     7. Long term current use of antiarrhythmic drug  Z79.899     8. Type 2 diabetes mellitus without complication, with long-term current use of insulin (HCC)  E11.9    Z79.4     9. Pure hypercholesterolemia  E78.00     10. Hypertriglyceridemia  E78.1     11. Statin intolerance  Z78.9  12. OSA treated with BiPAP  G47.33          RECOMMENDATIONS: Joshua Lara is a 77 y.o. male whose past medical history and cardiovascular risk factors include: Paroxysmal atrial fibrillation, oral anticoagulation, long-term antiarrhythmic medication, recovered cardiomyopathy w/ HFimpEF, ascending aortic aneurysm 42  mm (CT Chest 07/04/2022), aortic atherosclerosis, hypertension, hyperlipidemia, OSA on CPAP, diabetes mellitus, glaucoma, obesity due to excess calories, advanced age.  Heart failure with improved ejection fraction (HFimpEF) Stage C, NYHA class II/III Prior LVEF 45-50% (11/2019) and most recent LVEF 60 to 65% with grade 1 diastolic impairment, and estimated RAP 3 mmHg. Was on Entresto 49/51 milligrams p.o. twice daily and at last office visit this was transitioned to 24/26 mg p.o. twice daily.  Since then the medication has been discontinued due to nonproductive cough.  At times he experiences soft blood pressures and therefore will titrated down to losartan 25 mg p.o. daily. Start spironolactone 25 mg p.o. every morning. [place] Pepper has been discontinued due to yeast infection. Echocardiogram notes no significant valvular heart disease.  Dyspnea: Chronic but progressive. Etiology unknown. Multifactorial. Has undergone repeat echocardiogram without any significant change in LVEF or development of valvular heart disease. Hemoglobin does not suggest worsening anemia in the setting of anticoagulation. He is on methotrexate which raises the concern for possible methotrexate toxicity.  I have asked him to discuss this further with his rheumatology and pulmonary medicine. Will be started on nocturnal oxygen therapy. His last stress test was in July 2022 which is considered to be low risk.  However, given multiple office visits for worsening dyspnea and ED visits we discussed the role of right and left heart catheterization to evaluate for hemodynamics and obstructive disease.  Patient states that he would like to continue with current medical therapy and follow-up with pulmonary/rheumatology for other causes of his dyspnea.  I would like to see him back in 2 weeks if the dyspnea continues he is willing to proceed with angiography.  In the interim if his symptoms worsen in intensity frequency or duration he  is asked to go to the closest ER via EMS for further evaluation and management.  Aneurysm of ascending aorta without rupture He had a CT chest at [facility] on 07/08/2022 per report ascending aortic aneurysm measures 42 mm.  Echo from April 2024 notes ascending aorta to be 42 mm as well. CT of the chest scheduled for March 2025. Reemphasized importance of blood pressure management. Avoid medications such as ciprofloxacin.  Avoid increasing intra-abdominal pressure by lifting heavy objects. Medication changes as discussed above  Primary hypertension Office blood pressures are acceptable but not at goal. In the past orthostatic vital signs were positive and therefore the dose of Entresto was reduced at the last visit. Will discontinue Entresto and transition him to losartan and torsemide as discussed above.    Paroxysmal atrial fibrillation Long term (current) use of anticoagulants Rate control: Sotalol Rhythm control: Sotalol. Thromboembolic prophylaxis: Eliquis [id number] SCORE is 6 which correlates to 9.7% risk of stroke per year (diabetes, aortic atherosclerosis, HTN CHF, age). Patient does not endorse evidence of bleeding Discussed risks, benefits, alternatives to anticoagulation  Type 2 diabetes mellitus without complication, with long-term current use of insulin With complications of glaucoma and CHF Currently on Losartan, insulin, Repatha, fenofibrate. Jardiance attempted but discontinued due to history of yeast infection  Pure hypercholesterolemia Hypertriglyceridemia Statin intolerance Currently on medical therapy.  OSA treated with BiPAP Remains compliant with BiPAP use. Will be started on nocturnal oxygen.  FINAL MEDICATION LIST END OF ENCOUNTER: Meds ordered this encounter  Medications   losartan (COZAAR) 25 MG tablet    Sig: Take 1 tablet (25 mg total) by mouth every evening.    Dispense:  90 tablet    Refill:  0   spironolactone (ALDACTONE) 25 MG  tablet    Sig: Take 1 tablet (25 mg total) by mouth in the morning.    Dispense:  90 tablet    Refill:  3     Current Outpatient Medications:    acetaminophen (TYLENOL) 650 MG CR tablet, Take 650 mg by mouth every 8 (eight) hours as needed for pain., Disp: , Rfl:    albuterol (VENTOLIN HFA) 108 (90 Base) MCG/ACT inhaler, Inhale into the lungs every 6 (six) hours as needed for wheezing or shortness of breath., Disp: , Rfl:    apixaban (ELIQUIS) 5 MG TABS tablet, TAKE 1 TABLET BY MOUTH EVERY 12 HOURS, Disp: 60 tablet, Rfl: 10   BD Lara NEEDLE NANO 2ND GEN 32G X 4 MM MISC, , Disp: , Rfl:    cyanocobalamin (VITAMIN B12) 500 MCG tablet, Take 500 mcg by mouth daily., Disp: , Rfl:    dorzolamide-timolol (COSOPT) 22.3-6.8 MG/ML ophthalmic solution, 1 drop 2 (two) times daily., Disp: , Rfl:    Evolocumab (REPATHA SURECLICK) 140 MG/ML SOAJ, INJECT 140 MG INTO THE SKIN EVERY 14 (FOURTEEN) DAYS, Disp: 2 mL, Rfl: 1   fenofibrate (TRICOR) 145 MG tablet, TAKE 1 TABLET BY MOUTH EVERY DAY, Disp: 90 tablet, Rfl: 1   fluticasone (FLONASE) 50 MCG/ACT nasal spray, Place into both nostrils daily., Disp: , Rfl:    folic acid (FOLVITE) 1 MG tablet, Take 1 mg by mouth daily., Disp: , Rfl:    HUMULIN R U-500 KWIKPEN 500 UNIT/ML kwikpen, Inject 125-150 Units into the skin 2 (two) times daily. , Disp: , Rfl:    latanoprost (XALATAN) 0.005 % ophthalmic solution, 1 drop at bedtime., Disp: , Rfl:    levothyroxine (SYNTHROID, LEVOTHROID) 100 MCG tablet, Take 100 mcg by mouth daily., Disp: , Rfl:    losartan (COZAAR) 25 MG tablet, Take 1 tablet (25 mg total) by mouth every evening., Disp: 90 tablet, Rfl: 0   methotrexate 2.5 MG tablet, Take 5 mg by mouth once a week. 5mg  in Morning, 5mg  at night, Disp: , Rfl:    montelukast (SINGULAIR) 10 MG tablet, Take 10 mg by mouth daily., Disp: , Rfl:    potassium chloride SA (KLOR-CON M) 20 MEQ tablet, Take 1 tablet (20 mEq total) by mouth 2 (two) times daily for 7 days., Disp: 14  tablet, Rfl: 0   sotalol (BETAPACE) 80 MG tablet, TAKE 0.5 TABLETS BY MOUTH EVERY MORNING AND TAKE 1 TABLET IN THE EVENING, Disp: 135 tablet, Rfl: 3   spironolactone (ALDACTONE) 25 MG tablet, Take 1 tablet (25 mg total) by mouth in the morning., Disp: 90 tablet, Rfl: 3   tamsulosin (FLOMAX) 0.4 MG CAPS capsule, SMARTSIG:1 Capsule(s) By Mouth Every Evening, Disp: , Rfl:    TRULICITY 0.75 MG/0.5ML SOPN, INJECT 0.75 MG ONCE A WEEK SUBCUTANEOUS 30 DAYS, Disp: , Rfl:    nitroGLYCERIN (NITROSTAT) 0.4 MG SL tablet, Place 1 tablet (0.4 mg total) under the tongue every 5 (five) minutes as needed for chest pain. If you require more than two tablets five minutes apart go to the nearest ER via EMS., Disp: 30 tablet, Rfl: 0   Vitamin D, Ergocalciferol, (DRISDOL) 1.25 MG (50000 UNIT) CAPS capsule, Take 50,000 Units by mouth  once a week. (Patient not taking: Reported on 12/04/2022), Disp: , Rfl:   Orders Placed This Encounter  Procedures   Pro b natriuretic peptide (BNP)   Basic metabolic panel   Magnesium   --Continue cardiac medications as reconciled in final medication list. --Return in about 2 weeks (around 12/18/2022) for Follow up, Dyspnea. Or sooner if needed. --Continue follow-up with your primary care physician regarding the management of your other chronic comorbid conditions.  Patient's questions and concerns were addressed to his satisfaction. He voices understanding of the instructions provided during this encounter.   This note was created using a voice recognition software as a result there may be grammatical errors inadvertently enclosed that do not reflect the nature of this encounter. Every attempt is made to correct such errors.   Tessa Lerner, [place], [facility]  Pager:  [phone] Office: [phone]

## 2022-12-06 ENCOUNTER — Inpatient Hospital Stay (HOSPITAL_COMMUNITY)
Admission: EM | Admit: 2022-12-06 | Discharge: 2022-12-09 | Disposition: A | Discharge status: Home / Self Care | Payer: PPO | Attending: Cardiology | Admitting: Cardiology

## 2022-12-06 ENCOUNTER — Encounter (HOSPITAL_COMMUNITY)
Admission: EM | Disposition: A | Discharge status: Home / Self Care | Payer: Self-pay | Source: Home / Self Care | Attending: Cardiology

## 2022-12-06 ENCOUNTER — Encounter (HOSPITAL_COMMUNITY): Payer: Self-pay | Admitting: Emergency Medicine

## 2022-12-06 ENCOUNTER — Emergency Department (HOSPITAL_COMMUNITY): Payer: PPO

## 2022-12-06 ENCOUNTER — Other Ambulatory Visit: Payer: Self-pay

## 2022-12-06 DIAGNOSIS — H409 Unspecified glaucoma: Secondary | ICD-10-CM | POA: Diagnosis present

## 2022-12-06 DIAGNOSIS — E7800 Pure hypercholesterolemia, unspecified: Secondary | ICD-10-CM

## 2022-12-06 DIAGNOSIS — Z961 Presence of intraocular lens: Secondary | ICD-10-CM | POA: Diagnosis present

## 2022-12-06 DIAGNOSIS — Z8249 Family history of ischemic heart disease and other diseases of the circulatory system: Secondary | ICD-10-CM

## 2022-12-06 DIAGNOSIS — E039 Hypothyroidism, unspecified: Secondary | ICD-10-CM | POA: Diagnosis present

## 2022-12-06 DIAGNOSIS — Z882 Allergy status to sulfonamides status: Secondary | ICD-10-CM

## 2022-12-06 DIAGNOSIS — Z79890 Hormone replacement therapy (postmenopausal): Secondary | ICD-10-CM

## 2022-12-06 DIAGNOSIS — Z79899 Other long term (current) drug therapy: Secondary | ICD-10-CM

## 2022-12-06 DIAGNOSIS — Z91048 Other nonmedicinal substance allergy status: Secondary | ICD-10-CM

## 2022-12-06 DIAGNOSIS — I442 Atrioventricular block, complete: Secondary | ICD-10-CM | POA: Diagnosis not present

## 2022-12-06 DIAGNOSIS — I4819 Other persistent atrial fibrillation: Secondary | ICD-10-CM

## 2022-12-06 DIAGNOSIS — Z841 Family history of disorders of kidney and ureter: Secondary | ICD-10-CM

## 2022-12-06 DIAGNOSIS — I480 Paroxysmal atrial fibrillation: Secondary | ICD-10-CM | POA: Diagnosis not present

## 2022-12-06 DIAGNOSIS — I950 Idiopathic hypotension: Secondary | ICD-10-CM | POA: Diagnosis not present

## 2022-12-06 DIAGNOSIS — I700 Atherosclerosis of aorta: Secondary | ICD-10-CM | POA: Diagnosis present

## 2022-12-06 DIAGNOSIS — K219 Gastro-esophageal reflux disease without esophagitis: Secondary | ICD-10-CM | POA: Diagnosis present

## 2022-12-06 DIAGNOSIS — I2119 ST elevation (STEMI) myocardial infarction involving other coronary artery of inferior wall: Secondary | ICD-10-CM | POA: Diagnosis not present

## 2022-12-06 DIAGNOSIS — I130 Hypertensive heart and chronic kidney disease with heart failure and stage 1 through stage 4 chronic kidney disease, or unspecified chronic kidney disease: Secondary | ICD-10-CM | POA: Diagnosis present

## 2022-12-06 DIAGNOSIS — R231 Pallor: Secondary | ICD-10-CM | POA: Diagnosis not present

## 2022-12-06 DIAGNOSIS — I7121 Aneurysm of the ascending aorta, without rupture: Secondary | ICD-10-CM

## 2022-12-06 DIAGNOSIS — R570 Cardiogenic shock: Secondary | ICD-10-CM

## 2022-12-06 DIAGNOSIS — R1111 Vomiting without nausea: Secondary | ICD-10-CM | POA: Diagnosis not present

## 2022-12-06 DIAGNOSIS — Z888 Allergy status to other drugs, medicaments and biological substances status: Secondary | ICD-10-CM

## 2022-12-06 DIAGNOSIS — R0603 Acute respiratory distress: Secondary | ICD-10-CM | POA: Diagnosis not present

## 2022-12-06 DIAGNOSIS — I9589 Other hypotension: Secondary | ICD-10-CM | POA: Diagnosis present

## 2022-12-06 DIAGNOSIS — J9611 Chronic respiratory failure with hypoxia: Secondary | ICD-10-CM | POA: Diagnosis not present

## 2022-12-06 DIAGNOSIS — Z9842 Cataract extraction status, left eye: Secondary | ICD-10-CM

## 2022-12-06 DIAGNOSIS — I4729 Other ventricular tachycardia: Secondary | ICD-10-CM

## 2022-12-06 DIAGNOSIS — I4891 Unspecified atrial fibrillation: Secondary | ICD-10-CM

## 2022-12-06 DIAGNOSIS — Z7985 Long-term (current) use of injectable non-insulin antidiabetic drugs: Secondary | ICD-10-CM

## 2022-12-06 DIAGNOSIS — Z955 Presence of coronary angioplasty implant and graft: Secondary | ICD-10-CM

## 2022-12-06 DIAGNOSIS — I517 Cardiomegaly: Secondary | ICD-10-CM | POA: Diagnosis not present

## 2022-12-06 DIAGNOSIS — I493 Ventricular premature depolarization: Secondary | ICD-10-CM

## 2022-12-06 DIAGNOSIS — I213 ST elevation (STEMI) myocardial infarction of unspecified site: Secondary | ICD-10-CM | POA: Diagnosis not present

## 2022-12-06 DIAGNOSIS — N1831 Chronic kidney disease, stage 3a: Secondary | ICD-10-CM | POA: Diagnosis present

## 2022-12-06 DIAGNOSIS — I10 Essential (primary) hypertension: Secondary | ICD-10-CM | POA: Diagnosis not present

## 2022-12-06 DIAGNOSIS — J449 Chronic obstructive pulmonary disease, unspecified: Secondary | ICD-10-CM | POA: Diagnosis not present

## 2022-12-06 DIAGNOSIS — E1122 Type 2 diabetes mellitus with diabetic chronic kidney disease: Secondary | ICD-10-CM | POA: Diagnosis present

## 2022-12-06 DIAGNOSIS — I428 Other cardiomyopathies: Secondary | ICD-10-CM | POA: Diagnosis present

## 2022-12-06 DIAGNOSIS — N179 Acute kidney failure, unspecified: Secondary | ICD-10-CM

## 2022-12-06 DIAGNOSIS — Z87891 Personal history of nicotine dependence: Secondary | ICD-10-CM

## 2022-12-06 DIAGNOSIS — G4733 Obstructive sleep apnea (adult) (pediatric): Secondary | ICD-10-CM | POA: Diagnosis present

## 2022-12-06 DIAGNOSIS — R079 Chest pain, unspecified: Secondary | ICD-10-CM | POA: Diagnosis not present

## 2022-12-06 DIAGNOSIS — I2510 Atherosclerotic heart disease of native coronary artery without angina pectoris: Secondary | ICD-10-CM | POA: Diagnosis present

## 2022-12-06 DIAGNOSIS — I5032 Chronic diastolic (congestive) heart failure: Secondary | ICD-10-CM | POA: Diagnosis present

## 2022-12-06 DIAGNOSIS — I5031 Acute diastolic (congestive) heart failure: Secondary | ICD-10-CM

## 2022-12-06 DIAGNOSIS — Z6839 Body mass index (BMI) 39.0-39.9, adult: Secondary | ICD-10-CM

## 2022-12-06 DIAGNOSIS — E781 Pure hyperglyceridemia: Secondary | ICD-10-CM

## 2022-12-06 DIAGNOSIS — Z789 Other specified health status: Secondary | ICD-10-CM

## 2022-12-06 DIAGNOSIS — Z7901 Long term (current) use of anticoagulants: Secondary | ICD-10-CM

## 2022-12-06 DIAGNOSIS — G8929 Other chronic pain: Secondary | ICD-10-CM | POA: Diagnosis present

## 2022-12-06 HISTORY — PX: LEFT HEART CATH AND CORONARY ANGIOGRAPHY: CATH118249

## 2022-12-06 HISTORY — PX: CORONARY/GRAFT ACUTE MI REVASCULARIZATION: CATH118305

## 2022-12-06 HISTORY — PX: TEMPORARY PACEMAKER: CATH118268

## 2022-12-06 HISTORY — PX: CARDIOVERSION: SHX1299

## 2022-12-06 LAB — CBC WITH DIFFERENTIAL/PLATELET
Abs Immature Granulocytes: 0.07 10*3/uL (ref 0.00–0.07)
Basophils Absolute: 0.1 10*3/uL (ref 0.0–0.1)
Basophils Relative: 1 %
Eosinophils Absolute: 0.5 10*3/uL (ref 0.0–0.5)
Eosinophils Relative: 5 %
HCT: 42.2 % (ref 39.0–52.0)
Hemoglobin: 14.5 g/dL (ref 13.0–17.0)
Immature Granulocytes: 1 %
Lymphocytes Relative: 12 %
Lymphs Abs: 1 10*3/uL (ref 0.7–4.0)
MCH: 31.7 pg (ref 26.0–34.0)
MCHC: 34.4 g/dL (ref 30.0–36.0)
MCV: 92.1 fL (ref 80.0–100.0)
Monocytes Absolute: 1.1 10*3/uL — ABNORMAL HIGH (ref 0.1–1.0)
Monocytes Relative: 12 %
Neutro Abs: 5.8 10*3/uL (ref 1.7–7.7)
Neutrophils Relative %: 69 %
Platelets: 251 10*3/uL (ref 150–400)
RBC: 4.58 MIL/uL (ref 4.22–5.81)
RDW: 13.7 % (ref 11.5–15.5)
WBC: 8.5 10*3/uL (ref 4.0–10.5)
nRBC: 0 % (ref 0.0–0.2)

## 2022-12-06 LAB — PROTIME-INR
INR: 1.2 (ref 0.8–1.2)
Prothrombin Time: 15.8 seconds — ABNORMAL HIGH (ref 11.4–15.2)

## 2022-12-06 LAB — APTT: aPTT: 29 seconds (ref 24–36)

## 2022-12-06 LAB — I-STAT CG4 LACTIC ACID, ED: Lactic Acid, Venous: 2.1 mmol/L (ref 0.5–1.9)

## 2022-12-06 SURGERY — CORONARY/GRAFT ACUTE MI REVASCULARIZATION
Anesthesia: LOCAL

## 2022-12-06 MED ORDER — ONDANSETRON HCL 4 MG/2ML IJ SOLN
4.0000 mg | Freq: Once | INTRAMUSCULAR | Status: AC
  Administered 2022-12-06: 4 mg via INTRAVENOUS

## 2022-12-06 MED ORDER — ATROPINE SULFATE 1 MG/10ML IJ SOSY
PREFILLED_SYRINGE | INTRAMUSCULAR | Status: AC
  Filled 2022-12-06: qty 10

## 2022-12-06 MED ORDER — SODIUM CHLORIDE 0.9 % IV SOLN: INTRAVENOUS | Status: DC

## 2022-12-06 MED ORDER — LIDOCAINE HCL (PF) 1 % IJ SOLN
INTRAMUSCULAR | Status: AC
  Filled 2022-12-06: qty 30

## 2022-12-06 MED ORDER — HEPARIN (PORCINE) IN NACL 1000-0.9 UT/500ML-% IV SOLN
INTRAVENOUS | Status: DC | PRN
  Administered 2022-12-06 (×2): 500 mL

## 2022-12-06 MED ORDER — ONDANSETRON HCL 4 MG/2ML IJ SOLN
INTRAMUSCULAR | Status: AC
  Filled 2022-12-06: qty 2

## 2022-12-06 MED ORDER — LIDOCAINE HCL (PF) 1 % IJ SOLN
INTRAMUSCULAR | Status: DC | PRN
  Administered 2022-12-06: 2 mL
  Administered 2022-12-07: 10 mL

## 2022-12-06 MED ORDER — MORPHINE SULFATE (PF) 4 MG/ML IV SOLN
4.0000 mg | Freq: Once | INTRAVENOUS | Status: AC
  Administered 2022-12-06: 4 mg via INTRAVENOUS

## 2022-12-06 MED ORDER — ASPIRIN 81 MG PO CHEW: 324.0000 mg | CHEWABLE_TABLET | Freq: Once | ORAL | Status: DC

## 2022-12-06 MED ORDER — MORPHINE SULFATE (PF) 2 MG/ML IV SOLN
INTRAVENOUS | Status: AC
  Filled 2022-12-06: qty 2

## 2022-12-06 MED ORDER — ATROPINE SULFATE 1 MG/ML IV SOLN
INTRAVENOUS | Status: DC | PRN
  Administered 2022-12-06: 1 mg via INTRAVENOUS

## 2022-12-06 MED ORDER — VERAPAMIL HCL 2.5 MG/ML IV SOLN
INTRAVENOUS | Status: AC
  Filled 2022-12-06: qty 2

## 2022-12-06 MED ORDER — DOPAMINE-DEXTROSE 3.2-5 MG/ML-% IV SOLN
INTRAVENOUS | Status: DC | PRN
  Administered 2022-12-06: 5 ug/kg/min via INTRAVENOUS

## 2022-12-06 MED ORDER — HEPARIN SODIUM (PORCINE) 5000 UNIT/ML IJ SOLN
4000.0000 [IU] | Freq: Once | INTRAMUSCULAR | Status: AC
  Administered 2022-12-06: 4000 [IU] via INTRAVENOUS
  Filled 2022-12-06: qty 1

## 2022-12-06 SURGICAL SUPPLY — 22 items
CABLE ADAPT PACING TEMP 12FT (ADAPTER) IMPLANT
CATH EXTRAC PRONTO LP 6F RND (CATHETERS) IMPLANT
CATH INFINITI 5FR JL4 (CATHETERS) IMPLANT
CATH INFINITI AMBI 5FR TG (CATHETERS) IMPLANT
CATH S G BIP PACING (CATHETERS) IMPLANT
CATH VISTA GUIDE 6FR JR4 (CATHETERS) IMPLANT
DEVICE RAD COMP TR BAND LRG (VASCULAR PRODUCTS) IMPLANT
GLIDESHEATH SLEND A-KIT 6F 22G (SHEATH) IMPLANT
GUIDEWIRE INQWIRE 1.5J.035X260 (WIRE) IMPLANT
INQWIRE 1.5J .035X260CM (WIRE) ×1
KIT ENCORE 26 ADVANTAGE (KITS) IMPLANT
KIT HEART LEFT (KITS) ×1 IMPLANT
KIT MICROPUNCTURE NIT STIFF (SHEATH) IMPLANT
PACK CARDIAC CATHETERIZATION (CUSTOM PROCEDURE TRAY) ×1 IMPLANT
SHEATH PINNACLE 6F 10CM (SHEATH) IMPLANT
SHEATH PROBE COVER 6X72 (BAG) IMPLANT
STENT SYNERGY XD 3.50X24 (Permanent Stent) IMPLANT
SYNERGY XD 3.50X24 (Permanent Stent) ×1 IMPLANT
SYR MEDRAD MARK 7 150ML (SYRINGE) ×1 IMPLANT
TRANSDUCER W/STOPCOCK (MISCELLANEOUS) ×1 IMPLANT
TUBING CIL FLEX 10 FLL-RA (TUBING) ×1 IMPLANT
WIRE COUGAR XT STRL 190CM (WIRE) IMPLANT

## 2022-12-06 NOTE — ED Triage Notes (Addendum)
Pt BIB [facility] from home as a STEMI. Pt was c/o sharp central pain that radiates to his back, left arm, and left jaw pain since 2200. Emesis x 2 with EMS. On eliquis for Afib.  Recent abx for URI.   Hx of chf  18 R AC  VS with EMS 176/110 100 HR 97 on RA Cbg 226  324 ASA with ems

## 2022-12-06 NOTE — ED Provider Notes (Signed)
[name] EMERGENCY DEPARTMENT AT [facility] Provider Note   CSN: [id number] Arrival date & time: 12/06/22  2327     History  Chief Complaint  Patient presents with   Code STEMI    Joshua Lara is a 77 y.o. male.  Patient presents to the emergency department for evaluation of chest pain.  Patient had onset of severe substernal chest pain, radiating into his back and left jaw around 10 PM.  He comes to the ED by ambulance.  Prehospital EKG concerning for inferior MI, code STEMI called in the field.  At arrival patient still experiencing significant pain.  He does take Eliquis, last dose was around 1 PM.  He did not take his nighttime dose.       Home Medications Prior to Admission medications   Medication Sig Start Date End Date Taking? Authorizing Provider  acetaminophen (TYLENOL) 650 MG CR tablet Take 650 mg by mouth every 8 (eight) hours as needed for pain.    [provider]  albuterol (VENTOLIN HFA) 108 (90 Base) MCG/ACT inhaler Inhale into the lungs every 6 (six) hours as needed for wheezing or shortness of breath.    [provider]  apixaban (ELIQUIS) 5 MG TABS tablet TAKE 1 TABLET BY MOUTH EVERY 12 HOURS 06/20/22   Tolia, Sunit, DO  BD PEN NEEDLE NANO 2ND GEN 32G X 4 MM MISC  11/22/19   [provider]  cyanocobalamin (VITAMIN B12) 500 MCG tablet Take 500 mcg by mouth daily.    [provider]  dorzolamide-timolol (COSOPT) 22.3-6.8 MG/ML ophthalmic solution 1 drop 2 (two) times daily.    [provider]  Evolocumab (REPATHA SURECLICK) 140 MG/ML SOAJ INJECT 140 MG INTO THE SKIN EVERY 14 (FOURTEEN) DAYS 10/20/22   Tolia, Sunit, DO  fenofibrate (TRICOR) 145 MG tablet TAKE 1 TABLET BY MOUTH EVERY DAY 10/20/22   Tolia, Sunit, DO  fluticasone (FLONASE) 50 MCG/ACT nasal spray Place into both nostrils daily.    [provider]  folic acid (FOLVITE) 1 MG tablet Take 1 mg by mouth daily. 09/05/19   [provider]  HUMULIN R U-500 KWIKPEN 500 UNIT/ML kwikpen Inject 125-150 Units into the skin 2 (two) times daily.  04/12/19   [provider]  latanoprost (XALATAN) 0.005 % ophthalmic solution 1 drop at bedtime.    [provider]  levothyroxine (SYNTHROID, LEVOTHROID) 100 MCG tablet Take 100 mcg by mouth daily.    [provider]  losartan (COZAAR) 25 MG tablet Take 1 tablet (25 mg total) by mouth every evening. 12/04/22 03/04/23  Tolia, Sunit, DO  methotrexate 2.5 MG tablet Take 5 mg by mouth once a week. 5mg  in Morning, 5mg  at night    [provider]  montelukast (SINGULAIR) 10 MG tablet Take 10 mg by mouth daily. 09/08/22   [provider]  nitroGLYCERIN (NITROSTAT) 0.4 MG SL tablet Place 1 tablet (0.4 mg total) under the tongue every 5 (five) minutes as needed for chest pain. If you require more than two tablets five minutes apart go to the nearest ER via EMS. 11/23/20 09/12/22  Tolia, Sunit, DO  potassium chloride SA (KLOR-CON M) 20 MEQ tablet Take 1 tablet (20 mEq total) by mouth 2 (two) times daily for 7 days. 11/30/22 12/07/22  Rondel Baton, MD  sotalol (BETAPACE) 80 MG tablet TAKE 0.5 TABLETS BY MOUTH EVERY MORNING AND TAKE 1 TABLET IN THE EVENING 06/20/22   Tolia, Sunit, DO  spironolactone (ALDACTONE) 25  MG tablet Take 1 tablet (25 mg total) by mouth in the morning. 12/04/22 03/04/23  Odis Hollingshead, Sunit, DO  tamsulosin (FLOMAX) 0.4 MG CAPS capsule SMARTSIG:1 Capsule(s) By Mouth Every Evening 07/18/21   [provider]  TRULICITY 0.75 MG/0.5ML SOPN INJECT 0.75 MG ONCE A WEEK SUBCUTANEOUS 30 DAYS 09/11/19   [provider]  Vitamin D, Ergocalciferol, (DRISDOL) 1.25 MG (50000 UNIT) CAPS capsule Take 50,000 Units by mouth once a week. Patient not taking: Reported on 12/04/2022 09/09/22   [provider]      Allergies    Bactrim [sulfamethoxazole-trimethoprim], Ranitidine, Ranitidine hcl, Adalimumab, Etanercept, Etanercept, Sulfa antibiotics,  Sulfur, and Tape    Review of Systems   Review of Systems  Physical Exam Updated Vital Signs BP (!) 160/100   Pulse (!) 105   Temp 98.2 F (36.8 C)   Resp (!) 25   Ht 5\' 10"  (1.778 m)   Wt 123 kg   SpO2 95%   BMI 38.91 kg/m  Physical Exam Vitals and nursing note reviewed.  Constitutional:      General: He is not in acute distress.    Appearance: He is well-developed.  HENT:     Head: Normocephalic and atraumatic.     Mouth/Throat:     Mouth: Mucous membranes are moist.  Eyes:     General: Vision grossly intact. Gaze aligned appropriately.     Extraocular Movements: Extraocular movements intact.     Conjunctiva/sclera: Conjunctivae normal.  Cardiovascular:     Rate and Rhythm: Normal rate and regular rhythm.     Pulses: Normal pulses.     Heart sounds: Normal heart sounds, S1 normal and S2 normal. No murmur heard.    No friction rub. No gallop.  Pulmonary:     Effort: Pulmonary effort is normal. No respiratory distress.     Breath sounds: Normal breath sounds.  Abdominal:     Palpations: Abdomen is soft.     Tenderness: There is no abdominal tenderness. There is no guarding or rebound.     Hernia: No hernia is present.  Musculoskeletal:        General: No swelling.     Cervical back: Full passive range of motion without pain, normal range of motion and neck supple. No pain with movement, spinous process tenderness or muscular tenderness. Normal range of motion.     Right lower leg: No edema.     Left lower leg: No edema.  Skin:    General: Skin is warm and dry.     Capillary Refill: Capillary refill takes less than 2 seconds.     Findings: No ecchymosis, erythema, lesion or wound.  Neurological:     Mental Status: He is alert and oriented to person, place, and time.     GCS: GCS eye subscore is 4. GCS verbal subscore is 5. GCS motor subscore is 6.     Cranial Nerves: Cranial nerves 2-12 are intact.     Sensory: Sensation is intact.     Motor: Motor function is  intact. No weakness or abnormal muscle tone.     Coordination: Coordination is intact.  Psychiatric:        Mood and Affect: Mood normal.        Speech: Speech normal.        Behavior: Behavior normal.     ED Results / Procedures / Treatments   Labs (all labs ordered are listed, but only abnormal results are displayed) Labs Reviewed  I-STAT CG4 LACTIC  ACID, ED - Abnormal; Notable for the following components:      Result Value   Lactic Acid, Venous 2.1 (*)    All other components within normal limits  HEMOGLOBIN A1C  CBC WITH DIFFERENTIAL/PLATELET  PROTIME-INR  APTT  COMPREHENSIVE METABOLIC PANEL  LIPID PANEL  TROPONIN I (HIGH SENSITIVITY)    EKG EKG Interpretation Date/Time:  Saturday December 06 2022 23:33:56 EDT Ventricular Rate:  103 PR Interval:  250 QRS Duration:  109 QT Interval:  365 QTC Calculation: 478 R Axis:   103  Text Interpretation: Sinus tachycardia Prolonged PR interval Inferior infarct, acute (LCx) Lateral leads are also involved >>> Acute MI <<< Confirmed by Gilda Crease [phone]) on 12/06/2022 11:44:55 PM  Radiology No results found.  Procedures Procedures    Medications Ordered in ED Medications  0.9 %  sodium chloride infusion (has no administration in time range)  aspirin chewable tablet 324 mg (324 mg Oral Not Given 12/06/22 2333)  heparin injection 4,000 Units (4,000 Units Intravenous Given 12/06/22 2337)  morphine (PF) 4 MG/ML injection 4 mg (4 mg Intravenous Given 12/06/22 2340)  ondansetron (ZOFRAN) injection 4 mg (4 mg Intravenous Given 12/06/22 2341)    ED Course/ Medical Decision Making/ A&P                             Medical Decision Making Amount and/or Complexity of Data Reviewed External Data Reviewed: ECG and notes. Labs: ordered. Decision-making details documented in ED Course. Radiology: ordered and independent interpretation performed. Decision-making details documented in ED Course. ECG/medicine tests: ordered and  independent interpretation performed. Decision-making details documented in ED Course.  Risk OTC drugs. Prescription drug management.   Differential Diagnosis considered includes, but not limited to: STEMI; NSTEMI; myocarditis; pericarditis; pulmonary embolism; aortic dissection; pneumothorax; pneumonia; gastritis; musculoskeletal pain  Presents to the emergency department for evaluation of substernal chest pain, radiating to the back and jaw.  Prehospital EKG with ST elevations in inferior and lateral leads.  Code STEMI initiated via transmitted EKG.  STEMI protocol followed at arrival.  Patient administered aspirin prehospital.  He is on Eliquis but has not had his nighttime dose, given a bolus of heparin.  EKG still with ST elevations.  Discussed briefly with Dr. Jacinto Halim, his primary cardiologist.  Patient will go directly to the Cath Lab for heart cath/revascularization.  CRITICAL CARE Performed by: Gilda Crease   Total critical care time: 30 minutes  Critical care time was exclusive of separately billable procedures and treating other patients.  Critical care was necessary to treat or prevent imminent or life-threatening deterioration.  Critical care was time spent personally by me on the following activities: development of treatment plan with patient and/or surrogate as well as nursing, discussions with consultants, evaluation of patient's response to treatment, examination of patient, obtaining history from patient or surrogate, ordering and performing treatments and interventions, ordering and review of laboratory studies, ordering and review of radiographic studies, pulse oximetry and re-evaluation of patient's condition.         Final Clinical Impression(s) / ED Diagnoses Final diagnoses:  Acute ST elevation myocardial infarction (STEMI), unspecified artery [facility])    Rx / DC Orders ED Discharge Orders     None         Gilda Crease,  MD 12/06/22 2346

## 2022-12-06 NOTE — H&P (Signed)
CARDIOLOGY ADMIT NOTE   Patient ID: Joshua Lara MRN: [id number] DOB/AGE: 09/03/1945 77 y.o.  Admit date: 12/06/2022 Primary Physician:  Lonie Peak, PA-C  Patient ID: Joshua Lara, male    DOB: 07-31-1945, 77 y.o.   MRN: [id number]  Chief Complaint  Patient presents with   Code STEMI   HPI:    DESMINE SATURNO  is a 77 y.o. Paroxysmal atrial fibrillation, oral anticoagulation, long-term antiarrhythmic medication, recovered cardiomyopathy w/ HFimpEF, ascending aortic aneurysm 42 mm (CT Chest 07/04/2022), hypertension, hyperlipidemia, OSA on BiPAP, diabetes mellitus, glaucoma, obesity   Patient went out for a large dinner this evening, came home and had a ice cream and sat down watching TV, then started having severe chest pain radiating to his back associated with nausea and mild diaphoresis.  EMS was activated, patient found to be in acute inferolateral STEMI, patient emergently transferred to the [facility].  Upon arrival to the Cath Lab, patient still with active chest pain, was in severe chest pain and in mild respiratory distress.  Past Medical History:  Diagnosis Date   Arthritis    "hands, back, ankles" (05/11/2014)   CHF (congestive heart failure) (HCC)    Chronic lower back pain    Compression fracture of lumbar vertebra (HCC)    Coronary artery disease    Daily headache    "recently" (05/11/2014)   Depression    "I might be slight" (05/11/2014)   GERD (gastroesophageal reflux disease)    High cholesterol    Hypertension    Hypothyroid    OSA treated with BiPAP    Pneumonia    "couple times; last time was 03/2011" (05/11/2014)   Type II diabetes mellitus (HCC)    Past Surgical History:  Procedure Laterality Date   APPENDECTOMY     CARDIAC CATHETERIZATION  2001; 2009   CATARACT EXTRACTION W/ INTRAOCULAR LENS IMPLANT Left    CHOLECYSTECTOMY  1980's   Hattie Perch 07/13/2010    EYE MUSCLE SURGERY Left    LEFT AND RIGHT HEART CATHETERIZATION WITH  CORONARY ANGIOGRAM N/A 05/30/2014   Procedure: LEFT AND RIGHT HEART CATHETERIZATION WITH CORONARY ANGIOGRAM;  Surgeon: Dolores Patty, MD;  Location: [facility] CATH LAB;  Service: Cardiovascular;  Laterality: N/A;   LEFT HEART CATH AND CORONARY ANGIOGRAPHY N/A 12/20/2018   Procedure: LEFT HEART CATH AND CORONARY ANGIOGRAPHY and possible intervention;  Surgeon: [name], MD;  Location: [facility] INVASIVE CV LAB;  Service: Cardiovascular;  Laterality: N/A;   MASTOIDECTOMY Right 1970's   Hattie Perch 07/13/2010   SHOULDER OPEN ROTATOR CUFF REPAIR Left 07/2010   SPHINCTEROTOMY     TONSILLECTOMY AND ADENOIDECTOMY     Social History   Socioeconomic History   Marital status: Widowed    Spouse name: Not on file   Number of children: 2   Years of education: Not on file   Highest education level: Not on file  Occupational History   Occupation: retired  Tobacco Use   Smoking status: Former    Current packs/day: 0.00    Average packs/day: 2.0 packs/day for 30.0 years (60.0 ttl pk-yrs)    Types: Cigarettes    Start date: 05/12/1960    Quit date: 05/12/1990    Years since quitting: 32.5   Smokeless tobacco: Never  Vaping Use   Vaping status: Never Used  Substance and Sexual Activity   Alcohol use: Not Currently    Comment: 05/11/2014 "an occasional beer when out w/people; not often"   Drug use: No   Sexual  activity: Never  Other Topics Concern   Not on file  Social History Narrative   Not on file   Social Determinants of Health   Financial Resource Strain: Not on file  Food Insecurity: Not on file  Transportation Needs: Not on file  Physical Activity: Not on file  Stress: Not on file  Social Connections: Not on file  Intimate Partner Violence: Not on file   Family History  Problem Relation Age of Onset   Kidney failure Mother    Heart attack Father    Esophageal cancer Brother     ROS  ]Review of Systems  Cardiovascular:  Positive for chest pain and dyspnea on exertion. Negative for leg  swelling.   Objective      12/06/2022   11:33 PM 12/06/2022   11:30 PM 12/04/2022    2:10 PM  Vitals with BMI  Height  5\' 10"  5\' 10"   Weight  271 lbs 3 oz 271 lbs 3 oz  BMI  38.91 38.91  Systolic 160  148  Diastolic 100  88  Pulse 105  108      ]Physical Exam Constitutional:      Comments: Morbidly obese in no acute distress.  Neck:     Vascular: No carotid bruit or JVD.  Cardiovascular:     Rate and Rhythm: Regular rhythm. Bradycardia present.     Pulses:          Dorsalis pedis pulses are 0 on the right side and 2+ on the left side.       Posterior tibial pulses are 0 on the right side and 2+ on the left side.     Heart sounds: Normal heart sounds. No murmur heard.    No gallop.  Pulmonary:     Effort: Pulmonary effort is normal.     Breath sounds: Normal breath sounds. Decreased air movement (bases) present.  Abdominal:     General: Bowel sounds are normal.     Palpations: Abdomen is soft.     Comments: Obese. Pannus present  Musculoskeletal:     Right lower leg: No edema.     Left lower leg: No edema.    Laboratory examination:   Recent Labs    06/16/22 1827 11/30/22 1730  NA 136 134*  K 3.7 3.0*  CL 102 100  CO2 25 25  GLUCOSE 138* 204*  BUN 17 6*  CREATININE 1.00 0.98  CALCIUM 10.3 8.8*  GFRNONAA >60 >60   estimated creatinine clearance is 84.4 mL/min (by C-G formula based on SCr of 0.98 mg/dL).     Latest Ref Rng & Units 11/30/2022    5:30 PM 06/16/2022    6:27 PM 01/30/2020    1:54 PM  CMP  Glucose 70 - 99 mg/dL 213  086  578   BUN 8 - 23 mg/dL 6  17  9    Creatinine 0.61 - 1.24 mg/dL 4.69  6.29  5.28   Sodium 135 - 145 mmol/L 134  136  140   Potassium 3.5 - 5.1 mmol/L 3.0  3.7  4.1   Chloride 98 - 111 mmol/L 100  102  103   CO2 22 - 32 mmol/L 25  25  24    Calcium 8.9 - 10.3 mg/dL 8.8  41.3  9.9   Total Protein 6.5 - 8.1 g/dL 7.0  7.1    Total Bilirubin 0.3 - 1.2 mg/dL 0.7  0.8    Alkaline Phos 38 - 126 U/L 77  67  AST 15 - 41 U/L 32  24     ALT 0 - 44 U/L 34  26        Latest Ref Rng & Units 11/30/2022    5:30 PM 06/16/2022    6:27 PM 11/23/2019    2:35 PM  CBC  WBC 4.0 - 10.5 K/uL 6.9  7.3  5.5   Hemoglobin 13.0 - 17.0 g/dL 16.1  09.6  04.5   Hematocrit 39.0 - 52.0 % 40.0  41.5  44.0   Platelets 150 - 400 K/uL 234  183  167    Lipid Panel     Component Value Date/Time   CHOL 142 12/17/2018 0420   TRIG 109 12/17/2018 0420   HDL 59 12/17/2018 0420   CHOLHDL 2.4 12/17/2018 0420   VLDL 22 12/17/2018 0420   LDLCALC 61 12/17/2018 0420   HEMOGLOBIN A1C Lab Results  Component Value Date   HGBA1C 6.4 (H) 12/17/2018   MPG 136.98 12/17/2018   Lab Results  Component Value Date   TSH 3.819 12/17/2018    BNP (last 3 results) Recent Labs    06/16/22 1830 11/30/22 1800  BNP 19.8 24.9   Cardiac Panel (last 3 results) No results for input(s): "CKTOTAL", "CKMB", "TROPONINIHS", "RELINDX" in the last 72 hours.   Medications and allergies   Allergies  Allergen Reactions   Bactrim [Sulfamethoxazole-Trimethoprim] Swelling   Ranitidine Anaphylaxis   Ranitidine Hcl Anaphylaxis and Other (See Comments)    Has anaphylactic shock--intubated and in ICU x 4 days   Adalimumab Other (See Comments)   Etanercept Other (See Comments) and Cough    (ENBREL); Patient thinks it contributed to heart issues   Etanercept     Other reaction(s): Cough (ALLERGY/intolerance), Other (See Comments) (ENBREL); Patient thinks it contributed to heart issues   Sulfa Antibiotics Other (See Comments)   Sulfur Other (See Comments)   Tape Itching    Can only tolerate bandages for less than 24-hr periods Can only tolerate bandages for less than 24-hr periods     Current Outpatient Medications  Medication Instructions   acetaminophen (TYLENOL) 650 mg, Oral, Every 8 hours PRN   albuterol (VENTOLIN HFA) 108 (90 Base) MCG/ACT inhaler Inhalation, Every 6 hours PRN   apixaban (ELIQUIS) 5 MG TABS tablet TAKE 1 TABLET BY MOUTH EVERY 12 HOURS   BD PEN  NEEDLE NANO 2ND GEN 32G X 4 MM MISC No dose, route, or frequency recorded.   cyanocobalamin (VITAMIN B12) 500 mcg, Oral, Daily   dorzolamide-timolol (COSOPT) 22.3-6.8 MG/ML ophthalmic solution 1 drop, 2 times daily   Evolocumab (REPATHA SURECLICK) 140 MG/ML SOAJ INJECT 140 MG INTO THE SKIN EVERY 14 (FOURTEEN) DAYS   fenofibrate (TRICOR) 145 MG tablet TAKE 1 TABLET BY MOUTH EVERY DAY   fluticasone (FLONASE) 50 MCG/ACT nasal spray Each Nare, Daily   folic acid (FOLVITE) 1 mg, Oral, Daily   HumuLIN R U-500 KwikPen 125-150 Units, Subcutaneous, 2 times daily   latanoprost (XALATAN) 0.005 % ophthalmic solution 1 drop, Daily at bedtime   levothyroxine (SYNTHROID) 100 mcg, Oral, Daily,     losartan (COZAAR) 25 mg, Oral, Every evening   methotrexate (RHEUMATREX) 5 mg, Oral, Weekly, 5mg  in Morning, 5mg  at night    montelukast (SINGULAIR) 10 mg, Oral, Daily   nitroGLYCERIN (NITROSTAT) 0.4 mg, Sublingual, Every 5 min PRN, If you require more than two tablets five minutes apart go to the nearest ER via EMS.   potassium chloride SA (KLOR-CON M) 20 MEQ tablet 20 mEq,  Oral, 2 times daily   sotalol (BETAPACE) 80 MG tablet TAKE 0.5 TABLETS BY MOUTH EVERY MORNING AND TAKE 1 TABLET IN THE EVENING   spironolactone (ALDACTONE) 25 mg, Oral, Every morning   tamsulosin (FLOMAX) 0.4 MG CAPS capsule SMARTSIG:1 Capsule(s) By Mouth Every Evening   TRULICITY 0.75 MG/0.5ML SOPN INJECT 0.75 MG ONCE A WEEK SUBCUTANEOUS 30 DAYS   Vitamin D (Ergocalciferol) (DRISDOL) 50,000 Units, Weekly   Radiology:  No results found.  Cardiac Studies:   Heart Catheterization 12-25-18:  Left ventricle is borderline dilated.  Mild decrease in LV systolic function with global hypokinesis, EF 45%.  Normal LVEDP. Minimal luminal irregularity, no coronary artery disease.  Right dominant circulation.  Stress Testing:  Lexiscan/modified Bruce Tetrofosmin stress test 11/23/2019: Lexiscan/modified Bruce nuclear stress test performed using  1-day protocol. Stress EKG is non-diagnostic, as this is pharmacological stress test. In addition, stress EKG at 71% MPHR showed sinus rhythm, possible old anteroseptal infarct, poor R wave progression, low voltage, no ischemic changes.   Large areas of soft tissue attenuation seen, particularly in vertical long axis images. In addition, Apical thinning seen on both rest and stress images may be physiological. Overall, no reversible ischemia noted. Stress LVEF 49%, although visually appears normal.  Low risk study.    Echocardiogram 11/19/2022  04/26/024:   1. Left ventricular ejection fraction, by estimation, is 60 to 65%. The left ventricle has normal function. The left ventricle has no regional wall motion abnormalities. There is mild left ventricular hypertrophy. Left ventricular diastolic parameters are consistent with Grade I diastolic dysfunction (impaired relaxation).   2. Right ventricular systolic function is normal. The right ventricular size is normal.   3. The mitral valve is normal in structure. No evidence of mitral valve regurgitation. No evidence of mitral stenosis.   4. The aortic valve is tricuspid. There is mild calcification of the aortic valve. There is mild thickening of the aortic valve. Aortic valve regurgitation is not visualized. Aortic valve sclerosis is present, with no evidence of aortic valve stenosis.   5. Aortic dilatation noted. There is moderate dilatation of the ascending aorta, measuring 42 mm.   6. The inferior vena cava is normal in size with greater than 50% respiratory variability, suggesting right atrial pressure of 3 mmHg.   EKG 12/06/2022: Sinus tachycardia at rate of 103 bpm, inferior lateral STEMI.  Compared to 11/30/2022, electrical stim is new.  Assessment   1.  Acute inferior lateral STEMI 2.  Complete heart block 3.  Cardiogenic shock  Recommendations:   Patient blood pressure is around 80 mmHg systolic, patient also having complete heart block  with heart rate around 45 bpm, will emergently proceed with cardiac catheterization, he may need support and also temporary pacemaker.  Patient is critically ill with life-threatening arrhythmia and ongoing acute inferolateral STEMI with chest pain.  Critical care time 40 minutes including discussions with [facility], ED physician, evaluation in the cardiac catheterization lab.   [name], MD, [facility] 12/06/2022, 11:44 PM Office: [phone] Fax: [phone] Pager: [phone]

## 2022-12-07 ENCOUNTER — Inpatient Hospital Stay (HOSPITAL_COMMUNITY): Payer: PPO

## 2022-12-07 DIAGNOSIS — Z789 Other specified health status: Secondary | ICD-10-CM

## 2022-12-07 DIAGNOSIS — Z841 Family history of disorders of kidney and ureter: Secondary | ICD-10-CM | POA: Diagnosis not present

## 2022-12-07 DIAGNOSIS — E7800 Pure hypercholesterolemia, unspecified: Secondary | ICD-10-CM

## 2022-12-07 DIAGNOSIS — R570 Cardiogenic shock: Secondary | ICD-10-CM | POA: Diagnosis not present

## 2022-12-07 DIAGNOSIS — Z87891 Personal history of nicotine dependence: Secondary | ICD-10-CM | POA: Diagnosis not present

## 2022-12-07 DIAGNOSIS — I4729 Other ventricular tachycardia: Secondary | ICD-10-CM

## 2022-12-07 DIAGNOSIS — I950 Idiopathic hypotension: Secondary | ICD-10-CM | POA: Diagnosis not present

## 2022-12-07 DIAGNOSIS — I442 Atrioventricular block, complete: Secondary | ICD-10-CM | POA: Diagnosis not present

## 2022-12-07 DIAGNOSIS — G4733 Obstructive sleep apnea (adult) (pediatric): Secondary | ICD-10-CM | POA: Diagnosis not present

## 2022-12-07 DIAGNOSIS — E781 Pure hyperglyceridemia: Secondary | ICD-10-CM | POA: Diagnosis not present

## 2022-12-07 DIAGNOSIS — I2510 Atherosclerotic heart disease of native coronary artery without angina pectoris: Secondary | ICD-10-CM | POA: Diagnosis not present

## 2022-12-07 DIAGNOSIS — I4819 Other persistent atrial fibrillation: Secondary | ICD-10-CM

## 2022-12-07 DIAGNOSIS — J9611 Chronic respiratory failure with hypoxia: Secondary | ICD-10-CM | POA: Diagnosis not present

## 2022-12-07 DIAGNOSIS — I5031 Acute diastolic (congestive) heart failure: Secondary | ICD-10-CM

## 2022-12-07 DIAGNOSIS — I7121 Aneurysm of the ascending aorta, without rupture: Secondary | ICD-10-CM | POA: Diagnosis not present

## 2022-12-07 DIAGNOSIS — Z7901 Long term (current) use of anticoagulants: Secondary | ICD-10-CM | POA: Diagnosis not present

## 2022-12-07 DIAGNOSIS — Z8249 Family history of ischemic heart disease and other diseases of the circulatory system: Secondary | ICD-10-CM | POA: Diagnosis not present

## 2022-12-07 DIAGNOSIS — I2119 ST elevation (STEMI) myocardial infarction involving other coronary artery of inferior wall: Secondary | ICD-10-CM | POA: Diagnosis not present

## 2022-12-07 DIAGNOSIS — H409 Unspecified glaucoma: Secondary | ICD-10-CM | POA: Diagnosis not present

## 2022-12-07 DIAGNOSIS — Z79890 Hormone replacement therapy (postmenopausal): Secondary | ICD-10-CM | POA: Diagnosis not present

## 2022-12-07 DIAGNOSIS — I9589 Other hypotension: Secondary | ICD-10-CM | POA: Diagnosis not present

## 2022-12-07 DIAGNOSIS — N1831 Chronic kidney disease, stage 3a: Secondary | ICD-10-CM | POA: Diagnosis not present

## 2022-12-07 DIAGNOSIS — E039 Hypothyroidism, unspecified: Secondary | ICD-10-CM | POA: Diagnosis not present

## 2022-12-07 DIAGNOSIS — I4891 Unspecified atrial fibrillation: Secondary | ICD-10-CM | POA: Diagnosis not present

## 2022-12-07 DIAGNOSIS — I5032 Chronic diastolic (congestive) heart failure: Secondary | ICD-10-CM | POA: Diagnosis not present

## 2022-12-07 DIAGNOSIS — I130 Hypertensive heart and chronic kidney disease with heart failure and stage 1 through stage 4 chronic kidney disease, or unspecified chronic kidney disease: Secondary | ICD-10-CM | POA: Diagnosis not present

## 2022-12-07 DIAGNOSIS — I77819 Aortic ectasia, unspecified site: Secondary | ICD-10-CM | POA: Diagnosis not present

## 2022-12-07 DIAGNOSIS — I213 ST elevation (STEMI) myocardial infarction of unspecified site: Secondary | ICD-10-CM | POA: Diagnosis present

## 2022-12-07 DIAGNOSIS — E1122 Type 2 diabetes mellitus with diabetic chronic kidney disease: Secondary | ICD-10-CM | POA: Diagnosis not present

## 2022-12-07 DIAGNOSIS — N179 Acute kidney failure, unspecified: Secondary | ICD-10-CM | POA: Diagnosis not present

## 2022-12-07 DIAGNOSIS — G8929 Other chronic pain: Secondary | ICD-10-CM | POA: Diagnosis not present

## 2022-12-07 DIAGNOSIS — I428 Other cardiomyopathies: Secondary | ICD-10-CM | POA: Diagnosis not present

## 2022-12-07 DIAGNOSIS — I493 Ventricular premature depolarization: Secondary | ICD-10-CM | POA: Diagnosis not present

## 2022-12-07 DIAGNOSIS — Z6839 Body mass index (BMI) 39.0-39.9, adult: Secondary | ICD-10-CM | POA: Diagnosis not present

## 2022-12-07 DIAGNOSIS — I480 Paroxysmal atrial fibrillation: Secondary | ICD-10-CM | POA: Diagnosis not present

## 2022-12-07 LAB — COMPREHENSIVE METABOLIC PANEL WITH GFR
ALT: 29 U/L (ref 0–44)
AST: 40 U/L (ref 15–41)
Albumin: 3.5 g/dL (ref 3.5–5.0)
Alkaline Phosphatase: 85 U/L (ref 38–126)
Anion gap: 10 (ref 5–15)
BUN: 14 mg/dL (ref 8–23)
CO2: 24 mmol/L (ref 22–32)
Calcium: 9.3 mg/dL (ref 8.9–10.3)
Chloride: 98 mmol/L (ref 98–111)
Creatinine, Ser: 1.57 mg/dL — ABNORMAL HIGH (ref 0.61–1.24)
GFR, Estimated: 45 mL/min — ABNORMAL LOW (ref >60)
Glucose, Bld: 275 mg/dL — ABNORMAL HIGH (ref 70–99)
Potassium: 4.2 mmol/L (ref 3.5–5.1)
Sodium: 132 mmol/L — ABNORMAL LOW (ref 135–145)
Total Bilirubin: 1 mg/dL (ref 0.3–1.2)
Total Protein: >12 g/dL — ABNORMAL HIGH (ref 6.5–8.1)

## 2022-12-07 LAB — GLUCOSE, CAPILLARY
Glucose-Capillary: 266 mg/dL — ABNORMAL HIGH (ref 70–99)
Glucose-Capillary: 243 mg/dL — ABNORMAL HIGH (ref 70–99)
Glucose-Capillary: 216 mg/dL — ABNORMAL HIGH (ref 70–99)
Glucose-Capillary: 244 mg/dL — ABNORMAL HIGH (ref 70–99)
Glucose-Capillary: 208 mg/dL — ABNORMAL HIGH (ref 70–99)
Glucose-Capillary: 206 mg/dL — ABNORMAL HIGH (ref 70–99)

## 2022-12-07 LAB — BASIC METABOLIC PANEL
Anion gap: 10 (ref 5–15)
BUN: 14 mg/dL (ref 8–23)
CO2: 25 mmol/L (ref 22–32)
Calcium: 9.1 mg/dL (ref 8.9–10.3)
Chloride: 98 mmol/L (ref 98–111)
Creatinine, Ser: 1.44 mg/dL — ABNORMAL HIGH (ref 0.61–1.24)
GFR, Estimated: 50 mL/min — ABNORMAL LOW (ref >60)
Glucose, Bld: 233 mg/dL — ABNORMAL HIGH (ref 70–99)
Potassium: 3.9 mmol/L (ref 3.5–5.1)
Sodium: 133 mmol/L — ABNORMAL LOW (ref 135–145)

## 2022-12-07 LAB — CBC
HCT: 41.3 % (ref 39.0–52.0)
Hemoglobin: 14.2 g/dL (ref 13.0–17.0)
MCH: 31.3 pg (ref 26.0–34.0)
MCHC: 34.4 g/dL (ref 30.0–36.0)
MCV: 91.2 fL (ref 80.0–100.0)
Platelets: 216 10*3/uL (ref 150–400)
RBC: 4.53 MIL/uL (ref 4.22–5.81)
RDW: 13.7 % (ref 11.5–15.5)
WBC: 8.3 10*3/uL (ref 4.0–10.5)
nRBC: 0 % (ref 0.0–0.2)

## 2022-12-07 LAB — POCT I-STAT, CHEM 8
BUN: 17 mg/dL (ref 8–23)
Calcium, Ion: 1.22 mmol/L (ref 1.15–1.40)
Chloride: 97 mmol/L — ABNORMAL LOW (ref 98–111)
Creatinine, Ser: 1.6 mg/dL — ABNORMAL HIGH (ref 0.61–1.24)
Glucose, Bld: 280 mg/dL — ABNORMAL HIGH (ref 70–99)
HCT: 42 % (ref 39.0–52.0)
Hemoglobin: 14.3 g/dL (ref 13.0–17.0)
Potassium: 3.9 mmol/L (ref 3.5–5.1)
Sodium: 137 mmol/L (ref 135–145)
TCO2: 23 mmol/L (ref 22–32)

## 2022-12-07 LAB — TROPONIN I (HIGH SENSITIVITY)
Troponin I (High Sensitivity): 3914 ng/L (ref <18)
Troponin I (High Sensitivity): 52 ng/L — ABNORMAL HIGH (ref <18)

## 2022-12-07 LAB — LIPID PANEL
Cholesterol: 98 mg/dL (ref 0–200)
HDL: 36 mg/dL — ABNORMAL LOW (ref >40)
Total CHOL/HDL Ratio: 2.7 ratio
Triglycerides: 327 mg/dL — ABNORMAL HIGH (ref <150)
VLDL: 65 mg/dL — ABNORMAL HIGH (ref 0–40)

## 2022-12-07 LAB — ECHOCARDIOGRAM COMPLETE
Area-P 1/2: 5.42 cm2
Height: 70 in
MV M vel: 1.43 m/s
MV Peak grad: 8.2 mmHg
S' Lateral: 3 cm
Weight: 4246.94 oz

## 2022-12-07 LAB — POCT ACTIVATED CLOTTING TIME
Activated Clotting Time: 147 seconds
Activated Clotting Time: 262 seconds
Activated Clotting Time: 275 seconds

## 2022-12-07 LAB — LDL CHOLESTEROL, DIRECT: Direct LDL: 36 mg/dL (ref 0–99)

## 2022-12-07 LAB — MRSA NEXT GEN BY PCR, NASAL: MRSA by PCR Next Gen: NOT DETECTED

## 2022-12-07 MED ORDER — CLOPIDOGREL BISULFATE 300 MG PO TABS
300.0000 mg | ORAL_TABLET | Freq: Once | ORAL | Status: AC
  Administered 2022-12-07: 300 mg via ORAL
  Filled 2022-12-07: qty 1

## 2022-12-07 MED ORDER — SODIUM CHLORIDE 0.9 % IV SOLN: INTRAVENOUS | Status: AC

## 2022-12-07 MED ORDER — SODIUM CHLORIDE 0.9 % WEIGHT BASED INFUSION: 1.0000 mL/kg/h | INTRAVENOUS | Status: AC

## 2022-12-07 MED ORDER — TICAGRELOR 90 MG PO TABS
ORAL_TABLET | ORAL | Status: AC
  Filled 2022-12-07: qty 2

## 2022-12-07 MED ORDER — SOTALOL HCL 80 MG PO TABS
80.0000 mg | ORAL_TABLET | Freq: Every evening | ORAL | Status: DC
  Administered 2022-12-07: 80 mg via ORAL
  Filled 2022-12-07 (×2): qty 1

## 2022-12-07 MED ORDER — TAMSULOSIN HCL 0.4 MG PO CAPS
0.4000 mg | ORAL_CAPSULE | Freq: Every day | ORAL | Status: DC
  Administered 2022-12-07 – 2022-12-09 (×3): 0.4 mg via ORAL
  Filled 2022-12-07 (×3): qty 1

## 2022-12-07 MED ORDER — MIDAZOLAM HCL 2 MG/2ML IJ SOLN
INTRAMUSCULAR | Status: AC
  Filled 2022-12-07: qty 2

## 2022-12-07 MED ORDER — EZETIMIBE 10 MG PO TABS
10.0000 mg | ORAL_TABLET | Freq: Every day | ORAL | Status: DC
  Administered 2022-12-07 – 2022-12-09 (×3): 10 mg via ORAL
  Filled 2022-12-07 (×3): qty 1

## 2022-12-07 MED ORDER — ATORVASTATIN CALCIUM 80 MG PO TABS
80.0000 mg | ORAL_TABLET | Freq: Every day | ORAL | Status: DC
  Administered 2022-12-07: 80 mg via ORAL
  Filled 2022-12-07: qty 1

## 2022-12-07 MED ORDER — IOHEXOL 350 MG/ML SOLN
INTRAVENOUS | Status: DC | PRN
  Administered 2022-12-07: 130 mL

## 2022-12-07 MED ORDER — FENTANYL CITRATE (PF) 100 MCG/2ML IJ SOLN
INTRAMUSCULAR | Status: AC
  Filled 2022-12-07: qty 2

## 2022-12-07 MED ORDER — POTASSIUM CHLORIDE CRYS ER 20 MEQ PO TBCR
20.0000 meq | EXTENDED_RELEASE_TABLET | Freq: Two times a day (BID) | ORAL | Status: DC
  Administered 2022-12-07 (×2): 20 meq via ORAL
  Filled 2022-12-07 (×2): qty 1

## 2022-12-07 MED ORDER — ASPIRIN 81 MG PO TBEC
81.0000 mg | DELAYED_RELEASE_TABLET | Freq: Every day | ORAL | Status: DC
  Administered 2022-12-07 – 2022-12-09 (×3): 81 mg via ORAL
  Filled 2022-12-07 (×3): qty 1

## 2022-12-07 MED ORDER — FENTANYL CITRATE (PF) 100 MCG/2ML IJ SOLN
INTRAMUSCULAR | Status: DC | PRN
  Administered 2022-12-07: 50 ug via INTRAVENOUS

## 2022-12-07 MED ORDER — HEPARIN SODIUM (PORCINE) 1000 UNIT/ML IJ SOLN
INTRAMUSCULAR | Status: AC
  Filled 2022-12-07: qty 10

## 2022-12-07 MED ORDER — DILTIAZEM HCL 60 MG PO TABS: 60.0000 mg | ORAL_TABLET | Freq: Two times a day (BID) | ORAL | Status: DC

## 2022-12-07 MED ORDER — HEPARIN SODIUM (PORCINE) 1000 UNIT/ML IJ SOLN
INTRAMUSCULAR | Status: DC | PRN
  Administered 2022-12-07: 2000 [IU] via INTRAVENOUS
  Administered 2022-12-07: 8000 [IU] via INTRAVENOUS

## 2022-12-07 MED ORDER — APIXABAN 5 MG PO TABS
5.0000 mg | ORAL_TABLET | Freq: Two times a day (BID) | ORAL | Status: DC
  Administered 2022-12-07 – 2022-12-09 (×5): 5 mg via ORAL
  Filled 2022-12-07 (×5): qty 1

## 2022-12-07 MED ORDER — VERAPAMIL HCL 2.5 MG/ML IV SOLN
INTRAVENOUS | Status: DC | PRN
  Administered 2022-12-07: 10 mL via INTRA_ARTERIAL

## 2022-12-07 MED ORDER — SODIUM BICARBONATE 8.4 % IV SOLN
INTRAVENOUS | Status: AC
  Filled 2022-12-07: qty 50

## 2022-12-07 MED ORDER — LATANOPROST 0.005 % OP SOLN
1.0000 [drp] | Freq: Every day | OPHTHALMIC | Status: DC
  Administered 2022-12-07 – 2022-12-08 (×2): 1 [drp] via OPHTHALMIC
  Filled 2022-12-07: qty 2.5

## 2022-12-07 MED ORDER — INSULIN ASPART 100 UNIT/ML IJ SOLN
0.0000 [IU] | Freq: Every day | INTRAMUSCULAR | Status: DC
  Administered 2022-12-07: 2 [IU] via SUBCUTANEOUS
  Administered 2022-12-07: 3 [IU] via SUBCUTANEOUS

## 2022-12-07 MED ORDER — SOTALOL HCL 80 MG PO TABS
40.0000 mg | ORAL_TABLET | Freq: Every day | ORAL | Status: DC
  Administered 2022-12-07: 40 mg via ORAL
  Filled 2022-12-07 (×3): qty 0.5

## 2022-12-07 MED ORDER — FUROSEMIDE 10 MG/ML IJ SOLN
40.0000 mg | Freq: Every day | INTRAMUSCULAR | Status: DC
  Administered 2022-12-07: 40 mg via INTRAVENOUS
  Filled 2022-12-07: qty 4

## 2022-12-07 MED ORDER — LEVOTHYROXINE SODIUM 100 MCG PO TABS
100.0000 ug | ORAL_TABLET | Freq: Every day | ORAL | Status: DC
  Administered 2022-12-07 – 2022-12-09 (×3): 100 ug via ORAL
  Filled 2022-12-07 (×3): qty 1

## 2022-12-07 MED ORDER — CHLORHEXIDINE GLUCONATE CLOTH 2 % EX PADS
6.0000 | MEDICATED_PAD | Freq: Every day | CUTANEOUS | Status: DC
  Administered 2022-12-07 – 2022-12-09 (×3): 6 via TOPICAL

## 2022-12-07 MED ORDER — SODIUM CHLORIDE 0.9% FLUSH
3.0000 mL | Freq: Two times a day (BID) | INTRAVENOUS | Status: DC
  Administered 2022-12-07 – 2022-12-09 (×5): 3 mL via INTRAVENOUS

## 2022-12-07 MED ORDER — PERFLUTREN LIPID MICROSPHERE
1.0000 mL | INTRAVENOUS | Status: AC | PRN
  Administered 2022-12-07: 4 mL via INTRAVENOUS

## 2022-12-07 MED ORDER — SODIUM CHLORIDE 0.9% FLUSH: 3.0000 mL | INTRAVENOUS | Status: DC | PRN

## 2022-12-07 MED ORDER — INSULIN ASPART 100 UNIT/ML IJ SOLN
0.0000 [IU] | Freq: Three times a day (TID) | INTRAMUSCULAR | Status: DC
  Administered 2022-12-07 (×3): 7 [IU] via SUBCUTANEOUS
  Administered 2022-12-08: 3 [IU] via SUBCUTANEOUS
  Administered 2022-12-08: 7 [IU] via SUBCUTANEOUS
  Administered 2022-12-08: 11 [IU] via SUBCUTANEOUS
  Administered 2022-12-09: 15 [IU] via SUBCUTANEOUS
  Administered 2022-12-09: 4 [IU] via SUBCUTANEOUS
  Administered 2022-12-09: 11 [IU] via SUBCUTANEOUS

## 2022-12-07 MED ORDER — ACETAMINOPHEN 325 MG PO TABS
650.0000 mg | ORAL_TABLET | ORAL | Status: DC | PRN
  Administered 2022-12-07 – 2022-12-09 (×3): 650 mg via ORAL
  Filled 2022-12-07 (×3): qty 2

## 2022-12-07 MED ORDER — CLOPIDOGREL BISULFATE 75 MG PO TABS: 75.0000 mg | ORAL_TABLET | Freq: Every day | ORAL | Status: DC

## 2022-12-07 MED ORDER — SODIUM CHLORIDE 0.9 % IV SOLN: 250.0000 mL | INTRAVENOUS | Status: DC | PRN

## 2022-12-07 MED ORDER — DORZOLAMIDE HCL-TIMOLOL MAL 2-0.5 % OP SOLN: 1.0000 [drp] | Freq: Two times a day (BID) | OPHTHALMIC | Status: DC

## 2022-12-07 MED ORDER — INSULIN REGULAR HUMAN (CONC) 500 UNIT/ML ~~LOC~~ SOPN
100.0000 [IU] | PEN_INJECTOR | Freq: Every day | SUBCUTANEOUS | Status: DC
  Administered 2022-12-07 – 2022-12-09 (×3): 100 [IU] via SUBCUTANEOUS
  Filled 2022-12-07: qty 3

## 2022-12-07 MED ORDER — SOTALOL HCL 120 MG PO TABS: 120.0000 mg | ORAL_TABLET | Freq: Two times a day (BID) | ORAL | Status: DC

## 2022-12-07 MED ORDER — LOSARTAN POTASSIUM 25 MG PO TABS
25.0000 mg | ORAL_TABLET | Freq: Every evening | ORAL | Status: DC
  Administered 2022-12-07: 25 mg via ORAL
  Filled 2022-12-07: qty 1

## 2022-12-07 MED ORDER — PANTOPRAZOLE SODIUM 40 MG PO TBEC
40.0000 mg | DELAYED_RELEASE_TABLET | Freq: Every day | ORAL | Status: DC
  Administered 2022-12-07 – 2022-12-09 (×3): 40 mg via ORAL
  Filled 2022-12-07 (×3): qty 1

## 2022-12-07 MED ORDER — INSULIN REGULAR HUMAN (CONC) 500 UNIT/ML ~~LOC~~ SOPN
125.0000 [IU] | PEN_INJECTOR | Freq: Every day | SUBCUTANEOUS | Status: DC
  Administered 2022-12-07 – 2022-12-09 (×3): 125 [IU] via SUBCUTANEOUS
  Filled 2022-12-07: qty 3

## 2022-12-07 MED ORDER — CLOPIDOGREL BISULFATE 75 MG PO TABS
75.0000 mg | ORAL_TABLET | Freq: Every day | ORAL | Status: DC
  Administered 2022-12-08 – 2022-12-09 (×2): 75 mg via ORAL
  Filled 2022-12-07 (×2): qty 1

## 2022-12-07 MED ORDER — INSULIN REGULAR HUMAN (CONC) 500 UNIT/ML ~~LOC~~ SOPN: 125.0000 [IU] | PEN_INJECTOR | Freq: Two times a day (BID) | SUBCUTANEOUS | Status: DC

## 2022-12-07 MED ORDER — ATROPINE SULFATE 1 MG/10ML IJ SOSY
PREFILLED_SYRINGE | INTRAMUSCULAR | Status: AC
  Filled 2022-12-07: qty 10

## 2022-12-07 MED ORDER — ONDANSETRON HCL 4 MG/2ML IJ SOLN: 4.0000 mg | Freq: Four times a day (QID) | INTRAMUSCULAR | Status: DC | PRN

## 2022-12-07 MED ORDER — MIDAZOLAM HCL 2 MG/2ML IJ SOLN
INTRAMUSCULAR | Status: DC | PRN
  Administered 2022-12-07: 2 mg via INTRAVENOUS

## 2022-12-07 MED ORDER — SODIUM BICARBONATE 8.4 % IV SOLN
INTRAVENOUS | Status: DC | PRN
  Administered 2022-12-07: 50 meq via INTRAVENOUS

## 2022-12-07 MED ORDER — FENOFIBRATE 54 MG PO TABS
54.0000 mg | ORAL_TABLET | Freq: Every day | ORAL | Status: DC
  Administered 2022-12-07 – 2022-12-09 (×3): 54 mg via ORAL
  Filled 2022-12-07 (×3): qty 1

## 2022-12-07 MED ORDER — SPIRONOLACTONE 25 MG PO TABS
25.0000 mg | ORAL_TABLET | Freq: Every day | ORAL | Status: DC
  Administered 2022-12-07: 25 mg via ORAL
  Filled 2022-12-07: qty 1

## 2022-12-07 MED ORDER — TICAGRELOR 90 MG PO TABS
ORAL_TABLET | ORAL | Status: DC | PRN
  Administered 2022-12-07: 180 mg via ORAL

## 2022-12-07 MED ORDER — ALUM & MAG HYDROXIDE-SIMETH 200-200-20 MG/5ML PO SUSP
30.0000 mL | ORAL | Status: DC | PRN
  Administered 2022-12-07 – 2022-12-08 (×2): 30 mL via ORAL
  Filled 2022-12-07 (×2): qty 30

## 2022-12-07 MED ORDER — LABETALOL HCL 5 MG/ML IV SOLN: 10.0000 mg | INTRAVENOUS | Status: AC | PRN

## 2022-12-07 NOTE — Progress Notes (Signed)
Pt placed on CPAP for the night.   12/07/22 0343  BiPAP/CPAP/SIPAP  $ Non-Invasive Ventilator  Non-Invasive Vent Initial  $ Face Mask Large  Yes  BiPAP/CPAP/SIPAP Pt Type Adult  BiPAP/CPAP/SIPAP Resmed  Mask Type Full face mask  Mask Size Large  Respiratory Rate 23 breaths/min  EPAP 10 cmH2O  PEEP 10 cmH20  FiO2 (%) 32 %  Flow Rate 3 lpm  Minute Ventilation 10  Leak 7  Tidal Volume (Vt) 523  Patient Home Equipment No  Nasal massage performed Yes

## 2022-12-07 NOTE — Progress Notes (Signed)
  Echocardiogram 2D Echocardiogram has been performed.  Joshua Lara 12/07/2022, 9:55 AM

## 2022-12-07 NOTE — Progress Notes (Signed)
Progress Note  Patient Name: [name] MRN: [id number] DOB: February 16, 1946 Date of Encounter: 12/07/2022  Attending physician: [name], MD Primary care provider: Lonie Peak, PA-C Primary Cardiologist: Tessa Lerner, DO, [facility]  Subjective: Joshua Lara is a 77 y.o. Caucasian male who was seen and examined at bedside  Son present at bedside. No events overnight. Remains in sinus rhythm. Complains of mild epigastric discomfort -likely heartburn, per patient Case discussed and reviewed with his nurse.  Objective: Vital Signs in the last 24 hours: Temp:  [98 F (36.7 C)-98.4 F (36.9 C)] 98.1 F (36.7 C) (07/28 1137) Pulse Rate:  [0-132] 105 (07/28 1500) Resp:  [12-39] 19 (07/28 1500) BP: (74-160)/(53-100) 131/82 (07/28 1500) SpO2:  [79 %-100 %] 97 % (07/28 1500) FiO2 (%):  [32 %] 32 % (07/28 0343) Weight:  [120.4 kg-123 kg] 120.4 kg (07/28 0127)  Intake/Output:  Intake/Output Summary (Last 24 hours) at 12/07/2022 1607 Last data filed at 12/07/2022 1400 Gross per 24 hour  Intake 1182.2 ml  Output 1251 ml  Net -68.8 ml    Net IO Since Admission: -68.8 mL [12/07/22 1607]  Weights:     12/07/2022    1:27 AM 12/06/2022   11:30 PM 12/04/2022    2:10 PM  Last 3 Weights  Weight (lbs) 265 lb 6.9 oz 271 lb 2.7 oz 271 lb 3.2 oz  Weight (kg) 120.4 kg 123 kg 123.016 kg      Telemetry:  Overnight telemetry shows sinus with PVCs and rare short episodes of NSVT, which I personally reviewed.   Physical examination: PHYSICAL EXAM: Vitals:   12/07/22 1200 12/07/22 1300 12/07/22 1400 12/07/22 1500  BP: 138/79 127/80 122/76 131/82  Pulse: (!) 103 89 98 (!) 105  Resp: (!) 22 15 (!) 28 19  Temp:      TempSrc:      SpO2: 94% 97% 96% 97%  Weight:      Height:        Physical Exam  Constitutional: No distress.  Appears older than stated age, hemodynamically stable.   Neck:  Difficulty appreciate JVP secondary to shortness stature and adipose tissue  Cardiovascular:  Normal rate, regular rhythm, S1 normal, S2 normal, intact distal pulses and normal pulses. Exam reveals no gallop, no S3 and no S4.  No murmur heard. Pulmonary/Chest: No stridor. He has no wheezes. He has no rales.  Poor inspiratory effort, decreased breath sounds bilaterally.  Abdominal: Soft. Bowel sounds are normal. He exhibits no distension. There is no abdominal tenderness.  Abdominal obesity  Musculoskeletal:        General: No edema.     Cervical back: Neck supple.  Neurological: He is alert and oriented to person, place, and time. He has intact cranial nerves (2-12).  Skin: Skin is warm and moist.    Lab Results: Chemistry Recent Labs  Lab 11/30/22 1730 12/06/22 2334 12/07/22 0024 12/07/22 0450  NA 134* 132* 137 133*  K 3.0* 4.2 3.9 3.9  CL 100 98 97* 98  CO2 25 24  --  25  GLUCOSE 204* 275* 280* 233*  BUN 6* 14 17 14   CREATININE 0.98 1.57* 1.60* 1.44*  CALCIUM 8.8* 9.3  --  9.1  PROT 7.0 >12.0*  --   --   ALBUMIN 3.5 3.5  --   --   AST 32 40  --   --   ALT 34 29  --   --   ALKPHOS 77 85  --   --  BILITOT 0.7 1.0  --   --   GFRNONAA >60 45*  --  50*  ANIONGAP 9 10  --  10    Hematology Recent Labs  Lab 11/30/22 1730 12/06/22 2334 12/07/22 0024 12/07/22 0450  WBC 6.9 8.5  --  8.3  RBC 4.52 4.58  --  4.53  HGB 14.2 14.5 14.3 14.2  HCT 40.0 42.2 42.0 41.3  MCV 88.5 92.1  --  91.2  MCH 31.4 31.7  --  31.3  MCHC 35.5 34.4  --  34.4  RDW 14.1 13.7  --  13.7  PLT 234 251  --  216   High Sensitivity Troponin:   Recent Labs  Lab 12/06/22 2334 12/07/22 0134  TROPONINIHS 52* 3,914*     Cardiac EnzymesNo results for input(s): "TROPONINI" in the last 168 hours. No results for input(s): "TROPIPOC" in the last 168 hours.  BNP Recent Labs  Lab 11/30/22 1800  BNP 24.9    DDimer  Recent Labs  Lab 11/30/22 1800  DDIMER <0.27    Hemoglobin A1c:  Lab Results  Component Value Date   HGBA1C 6.4 (H) 12/17/2018   MPG 136.98 12/17/2018   TSH No results  for input(s): "TSH" in the last 8760 hours. Lipid Panel  Lab Results  Component Value Date   CHOL 98 12/06/2022   HDL 36 (L) 12/06/2022   LDLCALC 61 12/17/2018   LDLDIRECT 36 12/07/2022   TRIG 327 (H) 12/06/2022   CHOLHDL 2.7 12/06/2022   Drugs of Abuse  No results found for: "LABOPIA", "COCAINSCRNUR", "LABBENZ", "AMPHETMU", "THCU", "LABBARB"    Imaging: ECHOCARDIOGRAM COMPLETE  Result Date: 12/07/2022    ECHOCARDIOGRAM REPORT   Patient Name:   YUUKI Lara Date of Exam: 12/07/2022 Medical Rec #:  [id number]       Height:       70.0 in Accession #:    [id number]      Weight:       265.4 lb Date of Birth:  1946/04/19       BSA:          2.354 m Patient Age:    77 years        BP:           133/91 mmHg Patient Gender: M               HR:           98 bpm. Exam Location:  Inpatient Procedure: Cardiac Doppler, Color Doppler and Intracardiac Opacification Agent Indications:     Acute MI inferior lateral first episode care [facility] [address]) [[id number]]  History:         Patient has prior history of Echocardiogram examinations, most                  recent 09/05/2022. CHF, Acute MI and CAD, Arrythmias:Atrial                  Fibrillation and CHB; Risk Factors:Diabetes, Hypertension,                  Sleep Apnea, Former Smoker and Dyslipidemia.  Sonographer:     Aron Baba Referring Phys:  Lupita Shutter [name] Diagnosing Phys: Tessa Lerner DO  Sonographer Comments: Suboptimal apical window, suboptimal subcostal window and patient is obese. IMPRESSIONS  1. Left ventricular ejection fraction, by estimation, is 55 to 60%. The left ventricle has normal function. The left ventricle has no regional wall motion abnormalities. There is mild left  ventricular hypertrophy. Left ventricular diastolic parameters are consistent with Grade III diastolic dysfunction (restrictive). Elevated left ventricular end-diastolic pressure.  2. Right ventricular systolic function is normal. The right ventricular size is normal.  3. The mitral valve is  normal in structure. No evidence of mitral valve regurgitation. No evidence of mitral stenosis.  4. The aortic valve is grossly normal. Aortic valve regurgitation is not visualized. Aortic valve sclerosis is present, with no evidence of aortic valve stenosis.  5. Aortic dilatation noted. There is dilatation of the ascending aorta, measuring 39 mm. There is dilatation of the aortic root, measuring 43 mm.  6. The inferior vena cava is normal in size with greater than 50% respiratory variability, suggesting right atrial pressure of 3 mmHg. Comparison(s): Prior echo 09/05/2022: LVEF 60-65%, no RWMA, Grade 1 Diastolic dysfunction, ascending aorta 42mm, RAP . FINDINGS  Left Ventricle: Left ventricular ejection fraction, by estimation, is 55 to 60%. The left ventricle has normal function. The left ventricle has no regional wall motion abnormalities. Definity contrast agent was given IV to delineate the left ventricular  endocardial borders. The left ventricular internal cavity size was normal in size. There is mild left ventricular hypertrophy. Left ventricular diastolic parameters are consistent with Grade III diastolic dysfunction (restrictive). Elevated left ventricular end-diastolic pressure. Right Ventricle: The right ventricular size is normal. No increase in right ventricular wall thickness. Right ventricular systolic function is normal. Left Atrium: Left atrial size was normal in size. Right Atrium: Right atrial size was normal in size. Pericardium: There is no evidence of pericardial effusion. Mitral Valve: The mitral valve is normal in structure. No evidence of mitral valve regurgitation. No evidence of mitral valve stenosis. Tricuspid Valve: The tricuspid valve is normal in structure. Tricuspid valve regurgitation is not demonstrated. No evidence of tricuspid stenosis. Aortic Valve: The aortic valve is grossly normal. Aortic valve regurgitation is not visualized. Aortic valve sclerosis is present, with no  evidence of aortic valve stenosis. Pulmonic Valve: The pulmonic valve was grossly normal. Pulmonic valve regurgitation is not visualized. No evidence of pulmonic stenosis. Aorta: Aortic dilatation noted. There is dilatation of the ascending aorta, measuring 39 mm. There is dilatation of the aortic root, measuring 43 mm. Pulmonary Artery: The pulmonary artery is not well seen. Venous: The inferior vena cava is normal in size with greater than 50% respiratory variability, suggesting right atrial pressure of 3 mmHg. IAS/Shunts: The interatrial septum was not well visualized.  LEFT VENTRICLE PLAX 2D LVIDd:         4.10 cm   Diastology LVIDs:         3.00 cm   LV e' medial:    10.10 cm/s LV PW:         1.40 cm   LV E/e' medial:  13.1 LV IVS:        1.10 cm   LV e' lateral:   7.46 cm/s LVOT diam:     1.80 cm   LV E/e' lateral: 17.7 LV SV:         30 LV SV Index:   13 LVOT Area:     2.54 cm  RIGHT VENTRICLE RV S prime:     9.57 cm/s TAPSE (M-mode): 2.3 cm LEFT ATRIUM           Index        RIGHT ATRIUM           Index LA diam:      3.40 cm 1.44 cm/m   RA Area:  13.20 cm LA Vol (A2C): 31.2 ml 13.25 ml/m  RA Volume:   32.40 ml  13.76 ml/m LA Vol (A4C): 54.3 ml 23.07 ml/m  AORTIC VALVE LVOT Vmax:   67.45 cm/s LVOT Vmean:  46.500 cm/s LVOT VTI:    0.116 m  AORTA Ao Root diam: 4.30 cm Ao Asc diam:  3.90 cm MITRAL VALVE MV Area (PHT): 5.42 cm     SHUNTS MV Decel Time: 140 msec     Systemic VTI:  0.12 m MR Peak grad: 8.2 mmHg      Systemic Diam: 1.80 cm MR Vmax:      143.00 cm/s MV E velocity: 132.00 cm/s MV A velocity: 29.90 cm/s MV E/A ratio:  4.41 Asaiah Scarber DO Electronically signed by Tessa Lerner DO Signature Date/Time: 12/07/2022/3:12:29 PM    Final    CARDIAC CATHETERIZATION  Result Date: 12/07/2022 Images from the original result were not included. Left Heart Catheterization 12/07/22: LV: 109/19, EDP 27 mmHg.  Ao: 1 6/61, mean 76 mmHg.  No pressure gradient across the aortic valve on 10 mcg/kg/min of  dopamine. LM: Large-caliber vessel.  Smooth and normal. LAD: Large-caliber vessel, gives origin to large D1.  Smooth and normal. Ramus intermediate: Moderate caliber vessel.  Smooth and normal. CX: Moderate caliber vessel.  Smooth and normal. RCA: Large-caliber vessel, gives origin to a large PDA and 2 small PL branches originating close by to each other, there is an ulcerated 100% occlusion of the distal right coronary artery. Intervention data: Successful thrombectomy with Pronto aspiration followed by direct stenting with 3.5 x 24 mm Synergy XD DES at 13 atmospheric pressure, stenosis reduced from 100% to 0% TIMI-0 flow improved to TIMI-3 flow. Patient initially was in shock and hence temporarily needed dopamine and in view of complete heart block, patient was also paced temporarily however in the middle of the procedure, patient developed atrial fibrillation with rapid ventricular response.  At the end of the procedure, felt that maintaining sinus rhythm would be appropriate for hemodynamics, as patient came in initially sinus rhythm and patient has been compliant with Eliquis, synchronized direct-current cardioversion was performed with conversion of A-fib to normal sinus rhythm/sinus tachycardia. Patient will be started on Plavix tomorrow, he did receive Brilinta tonight, he will be on triple therapy for 1 month followed by Eliquis and Plavix for at least 6 months to a year if he tolerates otherwise at least 3 months of dual therapy followed by Eliquis and.  130 mL contrast utilized.   EP STUDY  Result Date: 12/07/2022 Images from the original result were not included. Left Heart Catheterization 12/07/22: LV: 109/19, EDP 27 mmHg.  Ao: 1 6/61, mean 76 mmHg.  No pressure gradient across the aortic valve on 10 mcg/kg/min of dopamine. LM: Large-caliber vessel.  Smooth and normal. LAD: Large-caliber vessel, gives origin to large D1.  Smooth and normal. Ramus intermediate: Moderate caliber vessel.  Smooth and  normal. CX: Moderate caliber vessel.  Smooth and normal. RCA: Large-caliber vessel, gives origin to a large PDA and 2 small PL branches originating close by to each other, there is an ulcerated 100% occlusion of the distal right coronary artery. Intervention data: Successful thrombectomy with Pronto aspiration followed by direct stenting with 3.5 x 24 mm Synergy XD DES at 13 atmospheric pressure, stenosis reduced from 100% to 0% TIMI-0 flow improved to TIMI-3 flow. Patient initially was in shock and hence temporarily needed dopamine and in view of complete heart block, patient was also paced temporarily however in  the middle of the procedure, patient developed atrial fibrillation with rapid ventricular response.  At the end of the procedure, felt that maintaining sinus rhythm would be appropriate for hemodynamics, as patient came in initially sinus rhythm and patient has been compliant with Eliquis, synchronized direct-current cardioversion was performed with conversion of A-fib to normal sinus rhythm/sinus tachycardia. Patient will be started on Plavix tomorrow, he did receive Brilinta tonight, he will be on triple therapy for 1 month followed by Eliquis and Plavix for at least 6 months to a year if he tolerates otherwise at least 3 months of dual therapy followed by Eliquis and.  130 mL contrast utilized.   DG Chest Port 1 View  Result Date: 12/07/2022 CLINICAL DATA:  Chest pain. EXAM: PORTABLE CHEST 1 VIEW COMPARISON:  Portable chest 11/30/2022 FINDINGS: Multiple overlying monitor wires. There is mild cardiomegaly. No vascular congestion is seen. The mediastinum is normally outlined. The lungs are mildly emphysematous but clear. Moderate thoracic spondylosis. Compare: Stable COPD chest. IMPRESSION: No evidence of acute chest disease. COPD. Mild cardiomegaly. Electronically Signed   By: Almira Bar M.D.   On: 12/07/2022 00:04    CARDIAC DATABASE: EKG: December 06, 2022: Sinus tachycardia, 103 bpm,  inferolateral STEMI.  December 07, 2022: Sinus rhythm, 95 bpm, first-degree AV block, low voltage, without underlying ischemia or injury pattern  Echocardiogram: 01/14/2019: LVEF 45-50%, calculated LVEF 40%, grade 1 diastolic impairment, mild LAE, see report for details.    December 07, 2022:  1. Left ventricular ejection fraction, by estimation, is 55 to 60%. The  left ventricle has normal function. The left ventricle has no regional  wall motion abnormalities. There is mild left ventricular hypertrophy.  Left ventricular diastolic parameters  are consistent with Grade III diastolic dysfunction (restrictive).  Elevated left ventricular end-diastolic pressure.   2. Right ventricular systolic function is normal. The right ventricular  size is normal.   3. The mitral valve is normal in structure. No evidence of mitral valve  regurgitation. No evidence of mitral stenosis.   4. The aortic valve is grossly normal. Aortic valve regurgitation is not  visualized. Aortic valve sclerosis is present, with no evidence of aortic  valve stenosis.   5. Aortic dilatation noted. There is dilatation of the ascending aorta,  measuring 39 mm. There is dilatation of the aortic root, measuring 43 mm.   6. The inferior vena cava is normal in size with greater than 50%  respiratory variability, suggesting right atrial pressure of 3 mmHg.   Comparison(s): Prior echo 09/05/2022: LVEF 60-65%, no RWMA, Grade 1  Diastolic dysfunction, ascending aorta 42mm, RAP .   Heart catheterization: 12/06/2022 Left Heart Catheterization 12/07/22:  LV: 109/19, EDP 27 mmHg.  Ao: 1 6/61, mean 76 mmHg.  No pressure gradient across the aortic valve on 10 mcg/kg/min of dopamine. LM: Large-caliber vessel.  Smooth and normal. LAD: Large-caliber vessel, gives origin to large D1.  Smooth and normal. Ramus intermediate: Moderate caliber vessel.  Smooth and normal. CX: Moderate caliber vessel.  Smooth and normal. RCA: Large-caliber  vessel, gives origin to a large PDA and 2 small PL branches originating close by to each other, there is an ulcerated 100% occlusion of the distal right coronary artery.    Intervention data: Successful thrombectomy with Pronto aspiration followed by direct stenting with 3.5 x 24 mm Synergy XD DES at 13 atmospheric pressure, stenosis reduced from 100% to 0% TIMI-0 flow improved to TIMI-3 flow.   Patient initially was in shock and hence temporarily  needed dopamine and in view of complete heart block, patient was also paced temporarily however in the middle of the procedure, patient developed atrial fibrillation with rapid ventricular response.  At the end of the procedure, felt that maintaining sinus rhythm would be appropriate for hemodynamics, as patient came in initially sinus rhythm and patient has been compliant with Eliquis, synchronized direct-current cardioversion was performed with conversion of A-fib to normal sinus rhythm/sinus tachycardia.   Patient will be started on Plavix tomorrow, he did receive Brilinta tonight, he will be on triple therapy for 1 month followed by Eliquis and Plavix for at least 6 months to a year if he tolerates otherwise at least 3 months of dual therapy followed by Eliquis and.  130 mL contrast utilized.    Direct-current cardioversion 12/06/2022: 100 J x 1, 200 J x 1, converted to sinus rhythm  Scheduled Meds:  apixaban  5 mg Oral Q12H   aspirin EC  81 mg Oral Daily   atorvastatin  80 mg Oral Daily   Chlorhexidine Gluconate Cloth  6 each Topical Daily   [START ON 12/08/2022] clopidogrel  75 mg Oral Daily   fenofibrate  54 mg Oral Daily   furosemide  40 mg Intravenous Daily   insulin aspart  0-20 Units Subcutaneous TID WC   insulin aspart  0-5 Units Subcutaneous QHS   insulin regular human CONCENTRATED  125 Units Subcutaneous Q breakfast   And   insulin regular human CONCENTRATED  100 Units Subcutaneous Q supper   latanoprost  1 drop Both Eyes QHS    levothyroxine  100 mcg Oral Daily   losartan  25 mg Oral QPM   potassium chloride SA  20 mEq Oral BID   sodium chloride flush  3 mL Intravenous Q12H   sotalol  40 mg Oral Daily   sotalol  80 mg Oral QPM   spironolactone  25 mg Oral Daily   tamsulosin  0.4 mg Oral QPC supper    Continuous Infusions:  sodium chloride     sodium chloride      PRN Meds: sodium chloride, acetaminophen, ondansetron (ZOFRAN) IV, sodium chloride flush   IMPRESSION & RECOMMENDATIONS: Joshua Lara is a 77 y.o. Caucasian male whose past medical history and cardiac risk factors include: Paroxysmal atrial fibrillation, on long-term anticoagulation/antiarrhythmic, recovered cardiomyopathy with heart failure with improved EF, ascending aortic aneurysm 42 mm (CT chest February 2024), aortic atherosclerosis, hypertension, hyperlipidemia, OSA on CPAP, diabetes mellitus, glaucoma, obesity due to excess calories, advanced age.  Impression: Acute inferolateral STEMI status post thrombectomy and primary stenting - 12/06/2022 PVCs. NSVT Pure hypercholesterolemia. Hypertriglyceridemia. Statin intolerance Acute HFpEF, stage C, NYHA class II/III Paroxysmal atrial fibrillation status post direct-current cardioversion in the Cath Lab Acute kidney injury. Hyponatremia. Aneurysm ascending aorta without rupture. Hypertension.  Recommendations. Acute inferolateral STEMI status post thrombectomy and primary stenting. Denies anginal chest pain.   With some discomfort in the epigastric region thinking is likely his heartburn. EKG this morning shows sinus rhythm without underlying ischemia or injury pattern. Telemetry notes underlying rhythm to be sinus with PVCs and asymptomatic brief episodes of NSVT. Interventional cardiology recommended triple therapy for 1 month with Plavix, aspirin, Eliquis. After the first month recommend Plavix and Eliquis - Eliquis and Plavix for at least 6 months to a year if he tolerates  otherwise at least 3 months of dual therapy.  Give Plavix 300 mg x 1 followed by 75 mg p.o. daily 12/08/2022 Will repeat fasting lipid profile. Continue fenofibrate. Will  hold atorvastatin given his statin intolerance.  Maximally tolerated statin outpatient was Livalo.  Start Zetia 10 mg p.o. daily.  Reached out to  pharmacy. Is on Repatha as outpatient. Cardiac rehab recommended. Compliant with his BiPAP use.  Was supposed to be started on nocturnal oxygen therapy  PVCs:  NSVT Asymptomatic.  Monitor for now.  Start low-dose diltiazem.  Hypercholesterolemia: Hypertriglyceridemia. Statin intolerance. Currently on Repatha as outpatient. Discontinue atorvastatin 80 mg p.o. nightly-before he develops myalgias. Maximally tolerated statin was Livalo as outpatient. Start Zetia 10 mg p.o. daily  Acute HFpEF, stage C, NYHA class II/III Known history of improved LVEF Most recent echocardiogram illustrates grade 3 diastolic dysfunction. Start Lasix 40 mg p.o. daily. Continue spironolactone, losartan. Did not tolerate Jardiance in the past-caused yeast infections Of note, Sherryll Burger has been weaned off due to soft blood pressures in the past.  Will need to be considered as outpatient Strict I's and O's and daily weights.  Paroxysmal atrial fibrillation: Currently sinus rhythm. Status post cardioversion 12/06/2022 Rate control: Sotalol, will start diltiazem Rhythm control: Sotalol. Thromboembolic prophylaxis: Eliquis Does not endorse evidence of bleeding. Reemphasized the risks, benefits, alternatives to anticoagulation.  Ascending aortic aneurysm without rupture: July 08, 2022: CT chest-42 mm April 2024: Echocardiogram-42 mm Scheduled for repeat chest as outpatient in March 2025 He is aware of avoiding medications such as ciprofloxacin, avoiding heavy lifting/Valsalva maneuvers, understands the importance of blood pressure management.  Acute kidney injury: Monitor for now, trial of  diuretics.  Diabetes mellitus type 2 with complications of hyperlipidemia, CAD: Check hemoglobin A1c. Home dose insulin plus sliding scale. Will discuss initiation of Ozempic in the setting of newly discovered CAD, diabetes-as outpatient  Patient's questions and concerns were addressed to his satisfaction. He voices understanding of the instructions provided during this encounter.   This note was created using a voice recognition software as a result there may be grammatical errors inadvertently enclosed that do not reflect the nature of this encounter. Every attempt is made to correct such errors.  Delilah Shan [facility]  Pager:  [phone] Office: [phone] 12/07/2022, 4:07 PM

## 2022-12-07 NOTE — Progress Notes (Signed)
   12/07/22 0011  Spiritual Encounters  Type of Visit Initial  Care provided to: Family  Referral source Code page  Reason for visit Code  OnCall Visit Yes   Chaplain responded to a Code STEMI for Pt. Pt arrived into ED and was quickly transferred to Cath Lab. I was told by the EMS team about Pt's daughter coming to see him. Chaplain found Pt's daughter and other relatives in the ED waiting area and took them to the 2H waiting area, where they wanted to wait until they have more news about Pt. Chaplain remains available if needed.

## 2022-12-07 NOTE — Progress Notes (Signed)
ACT 147.  Right femoral venous sheath removed per protocol.  Pt tolerated well.  Site level 0.  VSS.

## 2022-12-08 ENCOUNTER — Encounter (HOSPITAL_COMMUNITY): Payer: Self-pay | Admitting: Cardiology

## 2022-12-08 LAB — CORTISOL-AM, BLOOD: Cortisol - AM: 6.3 ug/dL — ABNORMAL LOW (ref 6.7–22.6)

## 2022-12-08 LAB — GLUCOSE, CAPILLARY
Glucose-Capillary: 158 mg/dL — ABNORMAL HIGH (ref 70–99)
Glucose-Capillary: 202 mg/dL — ABNORMAL HIGH (ref 70–99)
Glucose-Capillary: 281 mg/dL — ABNORMAL HIGH (ref 70–99)
Glucose-Capillary: 144 mg/dL — ABNORMAL HIGH (ref 70–99)
Glucose-Capillary: 167 mg/dL — ABNORMAL HIGH (ref 70–99)

## 2022-12-08 LAB — HEMOGLOBIN A1C
Hgb A1c MFr Bld: 7.4 % — ABNORMAL HIGH (ref 4.8–5.6)
Mean Plasma Glucose: 166 mg/dL

## 2022-12-08 MED ORDER — SOTALOL HCL 80 MG PO TABS
40.0000 mg | ORAL_TABLET | Freq: Two times a day (BID) | ORAL | Status: DC
  Administered 2022-12-09: 40 mg via ORAL
  Filled 2022-12-08 (×3): qty 0.5

## 2022-12-08 MED ORDER — SODIUM CHLORIDE 0.9 % IV SOLN: INTRAVENOUS | Status: AC

## 2022-12-08 MED ORDER — DIGOXIN 125 MCG PO TABS
0.2500 mg | ORAL_TABLET | ORAL | Status: AC
  Administered 2022-12-08 (×3): 0.25 mg via ORAL
  Filled 2022-12-08 (×3): qty 2

## 2022-12-08 MED ORDER — SORBITOL 70 % SOLN
30.0000 mL | Freq: Once | Status: AC
  Administered 2022-12-08: 30 mL via ORAL
  Filled 2022-12-08: qty 30

## 2022-12-08 MED ORDER — DIGOXIN 125 MCG PO TABS
0.1250 mg | ORAL_TABLET | Freq: Every day | ORAL | Status: DC
  Administered 2022-12-09: 0.125 mg via ORAL
  Filled 2022-12-08: qty 1

## 2022-12-08 MED ORDER — BISACODYL 10 MG RE SUPP
10.0000 mg | Freq: Once | RECTAL | Status: AC
  Administered 2022-12-08: 10 mg via RECTAL
  Filled 2022-12-08: qty 1

## 2022-12-08 MED ORDER — PRAVASTATIN SODIUM 40 MG PO TABS
40.0000 mg | ORAL_TABLET | Freq: Every day | ORAL | Status: DC
  Administered 2022-12-08 – 2022-12-09 (×2): 40 mg via ORAL
  Filled 2022-12-08 (×2): qty 1

## 2022-12-08 NOTE — Inpatient Diabetes Management (Addendum)
Inpatient Diabetes Program Recommendations  AACE/ADA: New Consensus Statement on Inpatient Glycemic Control (2015)  Target Ranges:  Prepandial:   less than 140 mg/dL      Peak postprandial:   less than 180 mg/dL (1-2 hours)      Critically ill patients:  140 - 180 mg/dL   Lab Results  Component Value Date   GLUCAP 281 (H) 12/08/2022   HGBA1C 7.4 (H) 12/06/2022    Review of Glycemic Control  Latest Reference Range & Units 12/07/22 16:51 12/07/22 22:13 12/08/22 06:43 12/08/22 09:36 12/08/22 11:25  Glucose-Capillary 70 - 99 mg/dL 086 (H) 578 (H) 469 (H) 202 (H) 281 (H)   Diabetes history: DM 2 Outpatient Diabetes medications: U500 125-150 units bid Current orders for Inpatient glycemic control:  Novolog 0-20 units tid with meals and HS U500 125 units with breakfast and U500 100 units with supper  Inpatient Diabetes Program Recommendations:    Consider increasing morning dose of U500 to 135 units with breakfast and increase evening dose of U500 to 115 units with supper.  Agree with possible use of GLP-1 for DM as well.   Thanks,  Beryl Meager, RN, BC-ADM Inpatient Diabetes Coordinator Pager [phone]  (8a-5p)

## 2022-12-08 NOTE — Discharge Summary (Signed)
Physician Discharge Summary  Patient ID: Joshua Lara MRN: [id number] DOB/AGE: 1946-01-04 77 y.o. Joshua Peak, PA-C   Admit date: 12/06/2022 Discharge date: 12/09/2022  Primary Discharge Diagnosis 1.  Acute inferolateral STEMI SP successful PCI to dominant distal RCA 2.  Hypotension cannot rule out hypoadrenalism 3.  Low AM S. Cortisol 4.  Diabetes mellitus type 2 controlled with stage IIIa chronic kidney disease 5.  Morbid obesity 6.  PAF now in sinus rhythm CHA2DS2-VASc Score is 5.  Yearly risk of stroke: 7% (A, CAD, DM, CHF).  Score of 1=0.6; 2=2.2; 3=3.2; 4=4.8; 5=7.2; 6=9.8; 7=>9.8) -(CHF; HTN; vasc disease DM,  Male = 1; Age <65 =0; 65-74 = 1,  >75 =2; stroke/embolism= 2).     Significant Diagnostic Studies:  EKG 12/07/2022: Sinus rhythm with first-degree AV block at the rate of 95 bpm, right axis, left posterior fascicular block nonspecific T abnormality.  Low-voltage complexes.  Pulmonary disease pattern.  Compared to 12/06/2022 inferior STEMI not present, compared to 11/30/2022, no change.  Echocardiogram 11/19/2022:   1. Left ventricular ejection fraction, by estimation, is 55 to 60%. The left ventricle has normal function. The left ventricle has no regional wall motion abnormalities. There is mild left ventricular hypertrophy. Left ventricular diastolic parameters  are consistent with Grade III diastolic dysfunction (restrictive). Elevated left ventricular end-diastolic pressure.  2. Right ventricular systolic function is normal. The right ventricular size is normal.  3. The mitral valve is normal in structure. No evidence of mitral valve regurgitation. No evidence of mitral stenosis.  4. The aortic valve is grossly normal. Aortic valve regurgitation is not visualized. Aortic valve sclerosis is present, with no evidence of aortic valve stenosis.  5. Aortic dilatation noted. There is dilatation of the ascending aorta, measuring 39 mm. There is dilatation of the aortic root,  measuring 43 mm.  6. The inferior vena cava is normal in size with greater than 50% respiratory variability, suggesting right atrial pressure of 3 mmHg.  Left Heart Catheterization 12/07/22:  LV: 109/19, EDP 27 mmHg.  Ao: 1 6/61, mean 76 mmHg.  No pressure gradient across the aortic valve on 10 mcg/kg/min of dopamine. LM: Large-caliber vessel.  Smooth and normal. LAD: Large-caliber vessel, gives origin to large D1.  Smooth and normal. Ramus intermediate: Moderate caliber vessel.  Smooth and normal. CX: Moderate caliber vessel.  Smooth and normal. RCA: Large-caliber vessel, gives origin to a large PDA and 2 small PL branches originating close by to each other, there is an ulcerated 100% occlusion of the distal right coronary artery.    Intervention data: Successful thrombectomy with Pronto aspiration followed by direct stenting with 3.5 x 24 mm Synergy XD DES at 13 atmospheric pressure, stenosis reduced from 100% to 0% TIMI-0 flow improved to TIMI-3 flow.   Patient initially was in shock and hence temporarily needed dopamine and in view of complete heart block, patient was also paced temporarily however in the middle of the procedure, patient developed atrial fibrillation with rapid ventricular response.  At the end of the procedure, felt that maintaining sinus rhythm would be appropriate for hemodynamics, as patient came in initially sinus rhythm and patient has been compliant with Eliquis, synchronized direct-current cardioversion was performed with conversion of A-fib to normal sinus rhythm/sinus tachycardia.   Patient will be started on Plavix tomorrow, he did receive Brilinta tonight, he will be on triple therapy for 1 month followed by Eliquis and Plavix for at least 6 months to a year if he tolerates otherwise at  least 3 months of dual therapy followed by Eliquis and.  130 mL contrast utilized.  Radiology: [facility] Chest Port 1 View 12/07/2022 CLINICAL DATA:  Chest pain. EXAM: PORTABLE CHEST 1  VIEW COMPARISON:   Portable chest 11/30/2022 FINDINGS: Multiple overlying monitor wires. There is mild cardiomegaly. No vascular congestion is seen. The mediastinum is normally outlined. The lungs are mildly emphysematous but clear. Moderate thoracic spondylosis.  Compare: Stable COPD chest.  IMPRESSION: No evidence of acute chest disease. COPD. Mild cardiomegaly.  Hospital Course: Joshua Lara is a 77 y.o. male  patient with h/o Paroxysmal atrial fibrillation, oral anticoagulation, long-term antiarrhythmic medication, recovered cardiomyopathy w/ HFimpEF, ascending aortic aneurysm 42 mm (CT Chest 07/04/2022), hypertension, hyperlipidemia, OSA on BiPAP, diabetes mellitus, glaucoma, obesity.  Admitted to the hospital via EMS on 12/06/2022 with acute inferolateral STEMI, cardiogenic shock, hypotension and complete heart block needing temporary transvenous pacemaker, also needing emergent cardiac catheterization and angioplasty to PDA and distal right coronary artery with implantation of drug-eluting stent.  Patient also underwent direct-current cardioversion in the same setting due to resolution of heart block and development of A-fib with RVR.  Patient was watched for 48 hours in the hospital, had brief episodes of NSVT but no recurrence of atrial fibrillation, cardiac rehab phase 1 ordered.  Patient was also treated with CPAP while in the hospital.    Fortunately patient did not need any advanced circulatory support or mechanical intubation, following 2 days of hospitalization felt stable for discharge.  Patient's hospitalization was prolonged due to attempt for guideline directed medical therapy and continued hypotension.  Serum cortisol a.m. was performed on 12/09/2022 revealing decreased level.  Recommendations on discharge: Triple therapy with aspirin, Plavix and Eliquis for 1 month followed by Plavix and Eliquis for 3 to 6 months and Eliquis indefinitely for ACS and DES.  Sotalol increased from  40 mg twice daily in view of PAF and NSVT.  Sotalol being a beta-blocker was considered to be appropriate in view of arrhythmias as well.  Pravastatin was started for hypercholesterolemia, continue Repatha as he has history of statin intolerance.  Weight loss discussed.  He needs outpatient workup for hypoadrenalism Cortisol - AM  07/230/2024: (6.7 - 22.6) ug/dL 6.3 Low    Patient has obesity hypoventilation syndrome and has marked hypoxemia, oxygen supplementation at 2 L/min was ordered and home health set up for oxygen supply.  Discharge Exam:    12/09/2022    1:00 PM 12/09/2022   12:00 PM 12/09/2022   11:00 AM  Vitals with BMI  Systolic 110 113 94  Diastolic 65 62 53  Pulse 91 98 89     Physical Exam Constitutional:      Comments: Morbidly obese in no acute distress.  Neck:     Vascular: No carotid bruit or JVD.  Cardiovascular:     Rate and Rhythm: Normal rate and regular rhythm.     Pulses:          Dorsalis pedis pulses are 0 on the right side and 0 on the left side.       Posterior tibial pulses are 0 on the right side and 2+ on the left side.     Heart sounds: Normal heart sounds. No murmur heard.    No gallop.  Pulmonary:     Effort: Pulmonary effort is normal.     Breath sounds: Normal breath sounds. Decreased air movement (bases) present.  Abdominal:     General: Bowel sounds are normal.  Palpations: Abdomen is soft.     Comments: Obese. Pannus present  Musculoskeletal:     Right lower leg: No edema.     Left lower leg: No edema.    Labs:   Lab Results  Component Value Date   WBC 8.3 12/07/2022   HGB 14.2 12/07/2022   HCT 41.3 12/07/2022   MCV 91.2 12/07/2022   PLT 216 12/07/2022    Recent Labs  Lab 12/06/22 2334 12/07/22 0024 12/09/22 0334  NA 132*   < > 136  K 4.2   < > 4.0  CL 98   < > 101  CO2 24   < > 26  BUN 14   < > 24*  CREATININE 1.57*   < > 1.37*  CALCIUM 9.3   < > 9.2  PROT >12.0*  --   --   BILITOT 1.0  --   --   ALKPHOS 85   --   --   ALT 29  --   --   AST 40  --   --   GLUCOSE 275*   < > 228*   < > = values in this interval not displayed.    Lipid Panel     Component Value Date/Time   CHOL 93 12/08/2022 0222   TRIG 118 12/08/2022 0222   HDL 39 (L) 12/08/2022 0222   CHOLHDL 2.4 12/08/2022 0222   VLDL 24 12/08/2022 0222   LDLCALC 30 12/08/2022 0222    BNP (last 3 results) Recent Labs    06/16/22 1830 11/30/22 1800  BNP 19.8 24.9    HEMOGLOBIN A1C Lab Results  Component Value Date   HGBA1C 7.1 (H) 12/07/2022   MPG 157 12/07/2022    Cardiac Panel (last 3 results) Recent Labs    12/06/22 2334 12/07/22 0134  TROPONINIHS 52* 3,914*    AM cortisol 12/09/2022:  Cortisol - AM  07/230/2024: (6.7 - 22.6) ug/dL 6.3 Low    TSH External labs 09/21/2022:  TSH normal at 1.24.  A1c 6.7%.  FOLLOW UP PLANS AND APPOINTMENTS Discharge Instructions     Amb Referral to Cardiac Rehabilitation   Complete by: As directed    Diagnosis:  Coronary Stents STEMI     After initial evaluation and assessments completed: Virtual Based Care may be provided alone or in conjunction with Phase 2 Cardiac Rehab based on patient barriers.: Yes   Intensive Cardiac Rehabilitation (ICR) [facility] location only OR Traditional Cardiac Rehabilitation (TCR) *If criteria for ICR are not met will enroll in TCR [facility] only): Yes      Allergies as of 12/09/2022       Reactions   Bactrim [sulfamethoxazole-trimethoprim] Swelling   Ranitidine Hcl Anaphylaxis, Other (See Comments)   Has anaphylactic shock--intubated and in ICU x 4 days   Adalimumab Other (See Comments)   Etanercept Other (See Comments), Cough   (ENBREL); Patient thinks it contributed to heart issues   Etanercept    Other reaction(s): Cough (ALLERGY/intolerance), Other (See Comments) (ENBREL); Patient thinks it contributed to heart issues   Other Other (See Comments)   Tested allergic   Sulfa Antibiotics Other (See Comments)   Tape Itching   Can only tolerate  bandages for less than 24-hr periods   Walnut Other (See Comments)   Tested allergic- Black walnuts        Medication List     STOP taking these medications    Entresto 24-26 MG Generic drug: sacubitril-valsartan   furosemide 20 MG tablet Commonly known  as: LASIX   losartan 25 MG tablet Commonly known as: COZAAR   montelukast 10 MG tablet Commonly known as: SINGULAIR   potassium chloride SA 20 MEQ tablet Commonly known as: KLOR-CON M   Rocklatan 0.02-0.005 % Soln Generic drug: Netarsudil-Latanoprost   spironolactone 25 MG tablet Commonly known as: ALDACTONE       TAKE these medications    acetaminophen 650 MG CR tablet Commonly known as: TYLENOL Take 650 mg by mouth every 8 (eight) hours as needed for pain.   albuterol 108 (90 Base) MCG/ACT inhaler Commonly known as: VENTOLIN HFA Inhale 1-2 puffs into the lungs every 4 (four) hours as needed for wheezing or shortness of breath.   Armodafinil 150 MG tablet Take 150 mg by mouth daily.   aspirin EC 81 MG tablet Take 1 tablet (81 mg total) by mouth daily. Swallow whole. Start taking on: December 10, 2022   BD Pen Needle Nano 2nd Gen 32G X 4 MM Misc Generic drug: Insulin Pen Needle   CENTRUM SILVER PO Take 1 tablet by mouth daily.   clobetasol cream 0.05 % Commonly known as: TEMOVATE Apply 1 Application topically as needed (breakouts on left arm).   clopidogrel 75 MG tablet Commonly known as: PLAVIX Take 1 tablet (75 mg total) by mouth daily. Start taking on: December 10, 2022   cyanocobalamin 500 MCG tablet Commonly known as: VITAMIN B12 Take 500 mcg by mouth daily.   Eliquis 5 MG Tabs tablet Generic drug: apixaban TAKE 1 TABLET BY MOUTH EVERY 12 HOURS What changed: how much to take   fenofibrate 145 MG tablet Commonly known as: TRICOR TAKE 1 TABLET BY MOUTH EVERY DAY   fluticasone 50 MCG/ACT nasal spray Commonly known as: FLONASE Place 1 spray into both nostrils 2 (two) times daily.   folic  acid 1 MG tablet Commonly known as: FOLVITE Take 1 mg by mouth daily.   FreeStyle Libre 2 Sensor Misc Inject 1 Device into the skin every 14 (fourteen) days.   HumuLIN R U-500 KwikPen 500 UNIT/ML KwikPen Generic drug: insulin regular human CONCENTRATED Inject 125-150 Units into the skin 2 (two) times daily.   latanoprost 0.005 % ophthalmic solution Commonly known as: XALATAN Place 1 drop into both eyes in the morning and at bedtime.   levothyroxine 100 MCG tablet Commonly known as: SYNTHROID Take 100 mcg by mouth daily.   methotrexate 2.5 MG tablet Commonly known as: RHEUMATREX Take 2.5 mg by mouth once a week.   nitroGLYCERIN 0.4 MG SL tablet Commonly known as: Nitrostat Place 1 tablet (0.4 mg total) under the tongue every 5 (five) minutes as needed for chest pain. If you require more than two tablets five minutes apart go to the nearest ER via EMS.   pantoprazole 40 MG tablet Commonly known as: PROTONIX Take 40 mg by mouth daily.   pravastatin 40 MG tablet Commonly known as: PRAVACHOL Take 1 tablet (40 mg total) by mouth daily at 6 PM.   Repatha SureClick 140 MG/ML Soaj Generic drug: Evolocumab INJECT 140 MG INTO THE SKIN EVERY 14 (FOURTEEN) DAYS What changed: See the new instructions.   sotalol 80 MG tablet Commonly known as: BETAPACE Take 0.5 tablets (40 mg total) by mouth 2 (two) times daily. What changed: See the new instructions.   tamsulosin 0.4 MG Caps capsule Commonly known as: FLOMAX Take 0.4 mg by mouth daily after supper.   Trulicity 3 MG/0.5ML Sopn Generic drug: Dulaglutide Inject 3 mg into the skin once a week.  sundays   Vitamin D (Ergocalciferol) 1.25 MG (50000 UNIT) Caps capsule Commonly known as: DRISDOL Take 50,000 Units by mouth once a week.               Durable Medical Equipment  (From admission, onward)           Start     Ordered   12/09/22 1257  For home use only DME oxygen  Once       Question Answer Comment  Length  of Need Lifetime   Mode or (Route) Nasal cannula   Liters per Minute 2   Frequency Continuous (stationary and portable oxygen unit needed)   Oxygen conserving device Yes   Oxygen delivery system Gas      07 /30/24 1257            Follow-up Information     Tolia, Sunit, DO Follow up on 12/18/2022.   Specialties: Cardiology, Vascular Surgery Why: 12/18/2022 1:45 PM Please bring all medicatins to the appointment Contact information: [address] [place] [address] [phone]         [facility] Follow up.   Why: Oxygen for home-to be delivered to the room Contact information: [address] [place] [address] [phone]                  [name], MD, [facility] 12/09/2022, 3:27 PM Office: [phone]

## 2022-12-08 NOTE — Progress Notes (Addendum)
Subjective:  Feels well, no chest pain, no specific symptoms.  Sitting on the side of the bed.  Intake/Output from previous day:  I/O last 3 completed shifts: In: 1902.2 [P.O.:1440; I.V.:462.2] Out: 2851 [Urine:2851] Total I/O In: 907.2 [P.O.:360; I.V.:547.2] Out: 600 [Urine:600] Net IO Since Admission: -641.64 mL [12/08/22 1856]  Blood pressure (!) 94/39, pulse (!) 101, temperature 98.8 F (37.1 C), temperature source Oral, resp. rate 14, height 5\' 10"  (1.778 m), weight 120.4 kg, SpO2 94%. Physical Exam Constitutional:      Comments: Morbidly obese in no acute distress.  Neck:     Vascular: No carotid bruit or JVD.  Cardiovascular:     Rate and Rhythm: Normal rate and regular rhythm.     Pulses:          Dorsalis pedis pulses are 0 on the right side and 0 on the left side.       Posterior tibial pulses are 0 on the right side and 2+ on the left side.     Heart sounds: Normal heart sounds. No murmur heard.    No gallop.  Pulmonary:     Effort: Pulmonary effort is normal.     Breath sounds: Normal breath sounds. Decreased air movement (bases) present.  Abdominal:     General: Bowel sounds are normal.     Palpations: Abdomen is soft.     Comments: Obese. Pannus present  Musculoskeletal:     Right lower leg: No edema.     Left lower leg: No edema.     Lab Results: Lab Results  Component Value Date   NA 133 (L) 12/07/2022   K 3.9 12/07/2022   CO2 25 12/07/2022   GLUCOSE 233 (H) 12/07/2022   BUN 14 12/07/2022   CREATININE 1.44 (H) 12/07/2022   CALCIUM 9.1 12/07/2022   GFRNONAA 50 (L) 12/07/2022        Latest Ref Rng & Units 12/07/2022    4:50 AM 12/07/2022   12:24 AM 12/06/2022   11:34 PM  BMP  Glucose 70 - 99 mg/dL 409  811  914   BUN 8 - 23 mg/dL 14  17  14    Creatinine 0.61 - 1.24 mg/dL 7.82  9.56  2.13   Sodium 135 - 145 mmol/L 133  137  132   Potassium 3.5 - 5.1 mmol/L 3.9  3.9  4.2   Chloride 98 - 111 mmol/L 98  97  98   CO2 22 - 32 mmol/L 25   24    Calcium 8.9 - 10.3 mg/dL 9.1   9.3       Latest Ref Rng & Units 12/06/2022   11:34 PM 11/30/2022    5:30 PM 06/16/2022    6:27 PM  Hepatic Function  Total Protein 6.5 - 8.1 g/dL >08.6  7.0  7.1   Albumin 3.5 - 5.0 g/dL 3.5  3.5  3.7   AST 15 - 41 U/L 40  32  24   ALT 0 - 44 U/L 29  34  26   Alk Phosphatase 38 - 126 U/L 85  77  67   Total Bilirubin 0.3 - 1.2 mg/dL 1.0  0.7  0.8       Latest Ref Rng & Units 12/07/2022    4:50 AM 12/07/2022   12:24 AM 12/06/2022   11:34 PM  CBC  WBC 4.0 - 10.5 K/uL 8.3   8.5   Hemoglobin 13.0 - 17.0 g/dL 57.8  46.9  14.5  Hematocrit 39.0 - 52.0 % 41.3  42.0  42.2   Platelets 150 - 400 K/uL 216   251    Lipid Panel     Component Value Date/Time   CHOL 93 12/08/2022 0222   TRIG 118 12/08/2022 0222   HDL 39 (L) 12/08/2022 0222   CHOLHDL 2.4 12/08/2022 0222   VLDL 24 12/08/2022 0222   LDLCALC 30 12/08/2022 0222   LDLDIRECT 35 12/08/2022 0222    Cardiac Panel (last 3 results) Recent Labs    12/06/22 2334 12/07/22 0134  TROPONINIHS 52* 3,914*   BNP (last 3 results) Recent Labs    06/16/22 1830 11/30/22 1800  BNP 19.8 24.9   HEMOGLOBIN A1C Lab Results  Component Value Date   HGBA1C 7.4 (H) 12/06/2022   MPG 166 12/06/2022   TSH Lab Results  Component Value Date   TSH 3.819 12/17/2018   Radiology:    [facility] Chest Port 1 View 12/07/2022 CLINICAL DATA:  Chest pain. EXAM: PORTABLE CHEST 1 VIEW COMPARISON:   Portable chest 11/30/2022 FINDINGS: Multiple overlying monitor wires. There is mild cardiomegaly. No vascular congestion is seen. The mediastinum is normally outlined. The lungs are mildly emphysematous but clear. Moderate thoracic spondylosis.  Compare: Stable COPD chest.  IMPRESSION: No evidence of acute chest disease. COPD. Mild cardiomegaly.  Cardiac Studies:  EKG 12/07/2022: Sinus rhythm with first-degree AV block at the rate of 95 bpm, right axis, left posterior fascicular block nonspecific T abnormality.  Low-voltage  complexes.  Pulmonary disease pattern.  Compared to 12/06/2022 inferior STEMI not present, compared to 11/30/2022, no change.   Echocardiogram 11/19/2022:   1. Left ventricular ejection fraction, by estimation, is 55 to 60%. The left ventricle has normal function. The left ventricle has no regional wall motion abnormalities. There is mild left ventricular hypertrophy. Left ventricular diastolic parameters  are consistent with Grade III diastolic dysfunction (restrictive). Elevated left ventricular end-diastolic pressure.  2. Right ventricular systolic function is normal. The right ventricular size is normal.  3. The mitral valve is normal in structure. No evidence of mitral valve regurgitation. No evidence of mitral stenosis.  4. The aortic valve is grossly normal. Aortic valve regurgitation is not visualized. Aortic valve sclerosis is present, with no evidence of aortic valve stenosis.  5. Aortic dilatation noted. There is dilatation of the ascending aorta, measuring 39 mm. There is dilatation of the aortic root, measuring 43 mm.  6. The inferior vena cava is normal in size with greater than 50% respiratory variability, suggesting right atrial pressure of 3 mmHg.   Left Heart Catheterization 12/07/22:  LV: 109/19, EDP 27 mmHg.  Ao: 1 6/61, mean 76 mmHg.  No pressure gradient across the aortic valve on 10 mcg/kg/min of dopamine. LM: Large-caliber vessel.  Smooth and normal. LAD: Large-caliber vessel, gives origin to large D1.  Smooth and normal. Ramus intermediate: Moderate caliber vessel.  Smooth and normal. CX: Moderate caliber vessel.  Smooth and normal. RCA: Large-caliber vessel, gives origin to a large PDA and 2 small PL branches originating close by to each other, there is an ulcerated 100% occlusion of the distal right coronary artery.    Intervention data: Successful thrombectomy with Pronto aspiration followed by direct stenting with 3.5 x 24 mm Synergy XD DES at 13 atmospheric pressure,  stenosis reduced from 100% to 0% TIMI-0 flow improved to TIMI-3 flow.   Patient initially was in shock and hence temporarily needed dopamine and in view of complete heart block, patient was also paced temporarily however  in the middle of the procedure, patient developed atrial fibrillation with rapid ventricular response.  At the end of the procedure, felt that maintaining sinus rhythm would be appropriate for hemodynamics, as patient came in initially sinus rhythm and patient has been compliant with Eliquis, synchronized direct-current cardioversion was performed with conversion of A-fib to normal sinus rhythm/sinus tachycardia.   Patient will be started on Plavix tomorrow, he did receive Brilinta tonight, he will be on triple therapy for 1 month followed by Eliquis and Plavix for at least 6 months to a year if he tolerates otherwise at least 3 months of dual therapy followed by Eliquis and.  130 mL contrast utilized.   Scheduled Meds:  apixaban  5 mg Oral Q12H   aspirin EC  81 mg Oral Daily   bisacodyl  10 mg Rectal Once   Chlorhexidine Gluconate Cloth  6 each Topical Daily   clopidogrel  75 mg Oral Daily   [START ON 12/09/2022] digoxin  0.125 mg Oral Daily   diltiazem  60 mg Oral Q12H   ezetimibe  10 mg Oral Daily   fenofibrate  54 mg Oral Daily   furosemide  40 mg Intravenous Daily   insulin aspart  0-20 Units Subcutaneous TID WC   insulin aspart  0-5 Units Subcutaneous QHS   insulin regular human CONCENTRATED  125 Units Subcutaneous Q breakfast   And   insulin regular human CONCENTRATED  100 Units Subcutaneous Q supper   latanoprost  1 drop Both Eyes QHS   levothyroxine  100 mcg Oral Daily   pantoprazole  40 mg Oral Daily   potassium chloride SA  20 mEq Oral BID   pravastatin  40 mg Oral q1800   sodium chloride flush  3 mL Intravenous Q12H   sotalol  40 mg Oral Daily   sotalol  80 mg Oral QPM   spironolactone  25 mg Oral Daily   tamsulosin  0.4 mg Oral QPC supper   Continuous  Infusions:  sodium chloride     sodium chloride     sodium chloride 75 mL/hr at 12/08/22 1800   PRN Meds:.sodium chloride, acetaminophen, alum & mag hydroxide-simeth, ondansetron (ZOFRAN) IV, sodium chloride flush  Assessment  Joshua Lara is a 77 y.o. male patient with  with h/o Paroxysmal atrial fibrillation, oral anticoagulation, long-term antiarrhythmic medication, recovered cardiomyopathy w/ HFimpEF, ascending aortic aneurysm 42 mm (CT Chest 07/04/2022), hypertension, hyperlipidemia, OSA on BiPAP, diabetes mellitus, glaucoma, obesity.  Admitted to the hospital via EMS on 12/06/2022 with acute inferolateral STEMI, cardiogenic shock, hypotension and complete heart block needing temporary transvenous pacemaker, also needing emergent cardiac catheterization and angioplasty to PDA and distal right coronary artery with implantation of drug-eluting stent.  Patient also underwent direct-current cardioversion in the same setting due to resolution of heart block and development of A-fib with RVR.   1.  Acute inferolateral STEMI SP successful PCI to dominant distal RCA 2.  Hypotension 3.  Nonischemic cardiomyopathy with severe LV systolic dysfunction now resolved 4.  Diabetes mellitus type 2 controlled with stage IIIa chronic kidney disease 5.  Morbid obesity 6.  PAF now in sinus rhythm CHA2DS2-VASc Score is 5.  Yearly risk of stroke: 7% (A, CAD, DM, CHF).  Score of 1=0.6; 2=2.2; 3=3.2; 4=4.8; 5=7.2; 6=9.8; 7=>9.8) -(CHF; HTN; vasc disease DM,  Male = 1; Age <65 =0; 65-74 = 1,  >75 =2; stroke/embolism= 2).    Plan:   Patient is hypotensive, upon discussion with my partner Dr. Odis Hollingshead patient  has chronic hypotension.  He is extremely sensitive to medication, hence it will be impossible for [place] to try to start him on guideline directed medical therapy.  Will discontinue any negative chronotropic agents and also ACE inhibitors or ARB in view of severe hypotension although patient asymptomatic.   Continue triple therapy with regard to DAPT plus Eliquis for now.  With regard to PAF, we will continue sotalol as he tolerates this well, 40 mg in the morning and decrease 80 mg in the evening to 40 mg in the evening as well.     I will add digoxin for rate control in case he goes back into atrial fibrillation in view of morbid obesity, severe OSA, recent coronary artery disease and myocardial infarction.  If hypotension persist, will consider addition of midodrine.  I do not suspect RV involvement as the myocardial infarction was in the distal RCA.  Patient does have history of low blood pressure.  Do not suspect aortic dissection as he has bounding pulses in the lower extremity and there is no blood pressure differential.  He does have aortic root dilatation.  I will check a.m. serum cortisol.  If he remains stable I will transfer him to the floor tomorrow.    [name], MD, [facility] 12/08/2022, 6:56 PM Office: [phone] Fax: [phone] Pager: [phone]

## 2022-12-08 NOTE — Progress Notes (Signed)
Pt placed on CPAP for the night    12/08/22 0016  BiPAP/CPAP/SIPAP  $ Non-Invasive Ventilator  Non-Invasive Vent Subsequent  BiPAP/CPAP/SIPAP Pt Type Adult  BiPAP/CPAP/SIPAP Resmed  Mask Type Full face mask  Mask Size Large  Respiratory Rate 23 breaths/min  EPAP 10 cmH2O  PEEP 10 cmH20  FiO2 (%) 44 %  Flow Rate 6 lpm  Minute Ventilation 11  Leak 8  Tidal Volume (Vt) 512  Patient Home Equipment No  Nasal massage performed Yes  CPAP/SIPAP surface wiped down Yes

## 2022-12-08 NOTE — TOC CM/SW Note (Signed)
Transition of Care [facility]) - Inpatient Brief Assessment   Patient Details  Name: Joshua Lara MRN: [id number] Date of Birth: 09-Sep-1945  Transition of Care [facility]) CM/SW Contact:    Gala Lewandowsky, RN Phone Number: 12/08/2022, 3:23 PM   Clinical Narrative: Patient presented for chest pain Stemi. Patient has insurance and PCP. Case Manager will continue to follow for transition of care needs as the patient progresses.    Transition of Care Asessment: Insurance and Status: Insurance coverage has been reviewed Patient has primary care physician: Yes Prior/Current Home Services: No current home services Social Determinants of Health Reivew: SDOH reviewed no interventions necessary Readmission risk has been reviewed: Yes Transition of care needs: no transition of care needs at this time

## 2022-12-08 NOTE — Progress Notes (Signed)
CARDIAC REHAB PHASE I   Pt nauseated and B/P labile.  Will hold education and walk for now. Will follow up tomorrow.   Woodroe Chen, RN BSN 12/08/2022 1:59 PM

## 2022-12-09 ENCOUNTER — Other Ambulatory Visit: Payer: Self-pay | Admitting: Cardiology

## 2022-12-09 LAB — GLUCOSE, CAPILLARY
Glucose-Capillary: 186 mg/dL — ABNORMAL HIGH (ref 70–99)
Glucose-Capillary: 319 mg/dL — ABNORMAL HIGH (ref 70–99)
Glucose-Capillary: 269 mg/dL — ABNORMAL HIGH (ref 70–99)

## 2022-12-09 MED ORDER — ASPIRIN 81 MG PO TBEC: 81.0000 mg | DELAYED_RELEASE_TABLET | Freq: Every day | ORAL | 12 refills | Status: DC

## 2022-12-09 MED ORDER — PRAVASTATIN SODIUM 40 MG PO TABS: 40.0000 mg | ORAL_TABLET | Freq: Every day | ORAL | 2 refills | Status: DC

## 2022-12-09 MED ORDER — CLOPIDOGREL BISULFATE 75 MG PO TABS: 75.0000 mg | ORAL_TABLET | Freq: Every day | ORAL | 1 refills | Status: DC

## 2022-12-09 NOTE — TOC Initial Note (Addendum)
Transition of Care [facility]) - Initial/Assessment Note    Patient Details  Name: Joshua Lara MRN: [id number] Date of Birth: [birth year]  Transition of Care [facility]) CM/SW Contact:    Gala Lewandowsky, RN Phone Number: 12/09/2022, 12:09 PM  Clinical Narrative:  Patient presented for Stemi-plan for Plavix. PTA patient was from home with support of son and daughter. Daughter at the bedside during the visit. Patient has a CPAP via [facility] and his PCP sent an order for oxygen bleed in. Case Manager called [facility] and spoke with [facility]- patient can either go in for another sleep study with a titration study vs do an ambulatory sat and qualify for continuous oxygen in the hospital. Ambulatory sat completed and the patient meets for continuous oxygen; staff RN aware that patient will need an order for home oxygen. Case Manager awaiting order to be submitted to [facility].              1317 12-09-22 [facility] is processing the order for oxygen and it will be delivered today. No further needs identified at this time.   Expected Discharge Plan: Home/Self Care Barriers to Discharge: No Barriers Identified   Patient Goals and CMS Choice Patient states their goals for this hospitalization and ongoing recovery are:: plan is to return home.   Choice offered to / list presented to : NA  Expected Discharge Plan and Services In-house Referral: NA Discharge Planning Services: CM Consult Post Acute Care Choice: Durable Medical Equipment Living arrangements for the past 2 months: Single Family Home                 DME Arranged: Oxygen DME Agency:  ([facility]) Date DME Agency Contacted: 12/09/22 Time DME Agency Contacted: 1208 Representative spoke with at DME Agency: [facility] HH Arranged: NA  Prior Living Arrangements/Services Living arrangements for the past 2 months: Single Family Home Lives with:: Self Patient language and need for  interpreter reviewed:: Yes        Need for Family Participation in Patient Care: Yes (Comment) Care giver support system in place?: Yes (comment)   Criminal Activity/Legal Involvement Pertinent to Current Situation/Hospitalization: No - Comment as needed  Activities of Daily Living Home Assistive Devices/Equipment: None ADL Screening (condition at time of admission) Patient's cognitive ability adequate to safely complete daily activities?: Yes Is the patient deaf or have difficulty hearing?: Yes Does the patient have difficulty seeing, even when wearing glasses/contacts?: No Does the patient have difficulty concentrating, remembering, or making decisions?: No Patient able to express need for assistance with ADLs?: Yes Does the patient have difficulty dressing or bathing?: No Independently performs ADLs?: Yes (appropriate for developmental age) Does the patient have difficulty walking or climbing stairs?: Yes Weakness of Legs: None Weakness of Arms/Hands: None  Permission Sought/Granted Permission sought to share information with : Case Manager, Magazine features editor, Family Supports Permission granted to share information with : Yes, Verbal Permission Granted     Permission granted to share info w AGENCY: [facility]  Emotional Assessment Appearance:: Appears stated age Attitude/Demeanor/Rapport: Engaged Affect (typically observed): Appropriate Orientation: : Oriented to Self, Oriented to Place, Oriented to  Time, Oriented to Situation Alcohol / Substance Use: Not Applicable Psych Involvement: No (comment)  Admission diagnosis:  Acute ST elevation myocardial infarction (STEMI), unspecified artery (HCC) [I21.3] Acute MI, inferolateral wall, initial episode of care [facility]) [I21.19] Patient Active Problem List   Diagnosis Date Noted   Acute MI,  inferolateral wall, initial episode of care (HCC) 12/07/2022   Persistent atrial fibrillation (HCC) 12/07/2022   Heart  block AV complete (HCC) 12/07/2022   Premature ventricular contractions 12/07/2022   NSVT (nonsustained ventricular tachycardia) (HCC) 12/07/2022   Hypercholesterolemia 12/07/2022   Hypertriglyceridemia 12/07/2022   Statin intolerance 12/07/2022   Acute heart failure with preserved ejection fraction (HFpEF) (HCC) 12/07/2022   Aneurysm of ascending aorta without rupture (HCC) 12/07/2022   AKI (acute kidney injury) (HCC) 12/07/2022   Atrial fibrillation status post cardioversion (HCC) 12/07/2022   Cardiogenic shock (HCC) 12/07/2022   Atrial fibrillation with rapid ventricular response (HCC) 12/17/2018   Paroxysmal atrial fibrillation (HCC) 12/17/2018   Chronic midline low back pain without sciatica 08/23/2018   Angioedema 11/04/2016   Severe tongue swelling    Acute on chronic diastolic heart failure (HCC) 05/15/2014   Dyspnea 05/10/2014   Acute respiratory failure (HCC) 05/10/2014   OSA on CPAP 05/10/2014   Abnormal CT scan, chest 05/10/2014   DOE (dyspnea on exertion) 04/05/2011   PNA (pneumonia) 04/05/2011   DM (diabetes mellitus) (HCC) 04/05/2011   HTN (hypertension) 04/05/2011   Tachycardia 04/05/2011   Chest pain 04/05/2011   Fever 04/05/2011   Leukocytosis 04/05/2011   Hypothyroidism 04/05/2011   GERD (gastroesophageal reflux disease) 04/05/2011   RA (rheumatoid arthritis) (HCC) 04/05/2011   Obesity 04/05/2011   CAD in native artery 04/05/2011   Hypokalemia 04/05/2011   PCP:  Lonie Peak, PA-C Pharmacy:   [facility] [phone] - [place], [place] - [address] AT [facility] [address] [place] [address] Phone: [phone] Fax: [phone]  Social Determinants of Health (SDOH) Social History: SDOH Screenings   Food Insecurity: No Food Insecurity (12/07/2022)  Housing: Low Risk  (12/07/2022)  Transportation Needs: No Transportation Needs (12/07/2022)  Utilities: Not At Risk (12/07/2022)  Depression (PHQ2-9): Medium Risk (08/23/2018)  Tobacco  Use: Medium Risk (12/06/2022)   Readmission Risk Interventions     No data to display

## 2022-12-09 NOTE — Progress Notes (Signed)
CARDIAC REHAB PHASE I   PRE:  Rate/Rhythm: 102 ST   BP:  Sitting: 112/65      SaO2: 94 RA  MODE:  Ambulation: 50 ft   POST:  Rate/Rhythm: 105 ST  BP:  Sitting: 101/88      SaO2: 96 RA  Pt sitting in chair, feeling better today. Pt ambulated using front wheel walker, moving at very slow pace. Pt with complaints of fatigue and SOB after short distance from chair. Pt denied dizziness or CP. Pt  maintained O2 saturation 95-98%on RA. However was SOB with increased work of breathing during short walk. Returned to chair with call bell and bedside table in reach. Post MI/stent education including site care, restrictions,risk factors, exercise guidelines, NTG use, heart healthy diabetic diet, antiplatelet therapy importance, MI booklet and CRP2 reviewed. All questions and concerns addressed. Will refer to [facility] for CRP2. Will continue to follow.   [id number]  Woodroe Chen, RN BSN 12/09/2022 10:43 AM

## 2022-12-09 NOTE — Progress Notes (Signed)
SATURATION QUALIFICATIONS: (This note is used to comply with regulatory documentation for home oxygen)  Patient Saturations on Room Air at Rest = 96%  Patient Saturations on Room Air while Ambulating = 88%  Patient Saturations on 2 Liters of oxygen while Ambulating = 96%  Patient becomes increasingly short of breath while ambulating. Oxygen saturation drops from 96 to 88 while ambulating.

## 2022-12-09 NOTE — Progress Notes (Signed)
   12/09/22 1514  Spiritual Encounters  Type of Visit Initial  Care provided to: Pt and family  Referral source Patient request  Reason for visit Advance directives  OnCall Visit No   Chaplain responded to Spiritual Consult for ACD.  Delivered education to Pt and his daughter.  Both parties agreed that it would be good proceed with paperwork.  They intend to fill it out this evening with the intention of getting it notarized in the am before possible afternoon check out.  Chaplain services remain available by Spiritual Consult or for emergent cases, paging [phone]  Chaplain Raelene Bott, MDiv [email or web address] [phone]

## 2022-12-11 ENCOUNTER — Telehealth: Payer: Self-pay

## 2022-12-11 NOTE — Telephone Encounter (Signed)
Called patient yesterday and today on both phone number on file no answer left a vm

## 2022-12-12 ENCOUNTER — Other Ambulatory Visit: Payer: Self-pay | Admitting: Cardiology

## 2022-12-12 DIAGNOSIS — I10 Essential (primary) hypertension: Secondary | ICD-10-CM | POA: Diagnosis not present

## 2022-12-12 DIAGNOSIS — I25118 Atherosclerotic heart disease of native coronary artery with other forms of angina pectoris: Secondary | ICD-10-CM | POA: Diagnosis not present

## 2022-12-12 DIAGNOSIS — I2119 ST elevation (STEMI) myocardial infarction involving other coronary artery of inferior wall: Secondary | ICD-10-CM | POA: Diagnosis not present

## 2022-12-12 DIAGNOSIS — Z7902 Long term (current) use of antithrombotics/antiplatelets: Secondary | ICD-10-CM | POA: Diagnosis not present

## 2022-12-12 DIAGNOSIS — J4531 Mild persistent asthma with (acute) exacerbation: Secondary | ICD-10-CM | POA: Diagnosis not present

## 2022-12-12 DIAGNOSIS — I5032 Chronic diastolic (congestive) heart failure: Secondary | ICD-10-CM | POA: Diagnosis not present

## 2022-12-12 DIAGNOSIS — Z955 Presence of coronary angioplasty implant and graft: Secondary | ICD-10-CM | POA: Diagnosis not present

## 2022-12-12 DIAGNOSIS — Z794 Long term (current) use of insulin: Secondary | ICD-10-CM | POA: Diagnosis not present

## 2022-12-12 DIAGNOSIS — R918 Other nonspecific abnormal finding of lung field: Secondary | ICD-10-CM | POA: Diagnosis not present

## 2022-12-12 DIAGNOSIS — I729 Aneurysm of unspecified site: Secondary | ICD-10-CM | POA: Diagnosis not present

## 2022-12-12 DIAGNOSIS — Z7901 Long term (current) use of anticoagulants: Secondary | ICD-10-CM | POA: Diagnosis not present

## 2022-12-12 DIAGNOSIS — R5383 Other fatigue: Secondary | ICD-10-CM | POA: Diagnosis not present

## 2022-12-12 DIAGNOSIS — G4733 Obstructive sleep apnea (adult) (pediatric): Secondary | ICD-10-CM | POA: Diagnosis not present

## 2022-12-12 DIAGNOSIS — Z7982 Long term (current) use of aspirin: Secondary | ICD-10-CM | POA: Diagnosis not present

## 2022-12-12 NOTE — Telephone Encounter (Signed)
Try again - AD was able to get hold of him.    [place], DO, [facility]

## 2022-12-16 ENCOUNTER — Ambulatory Visit: Payer: PPO | Admitting: Cardiology

## 2022-12-16 ENCOUNTER — Encounter: Payer: Self-pay | Admitting: Cardiology

## 2022-12-16 VITALS — BP 130/76 | HR 90 | Resp 16 | Ht 70.0 in | Wt 274.0 lb

## 2022-12-16 DIAGNOSIS — E7800 Pure hypercholesterolemia, unspecified: Secondary | ICD-10-CM | POA: Diagnosis not present

## 2022-12-16 DIAGNOSIS — E781 Pure hyperglyceridemia: Secondary | ICD-10-CM

## 2022-12-16 DIAGNOSIS — I480 Paroxysmal atrial fibrillation: Secondary | ICD-10-CM

## 2022-12-16 DIAGNOSIS — Z7901 Long term (current) use of anticoagulants: Secondary | ICD-10-CM | POA: Diagnosis not present

## 2022-12-16 DIAGNOSIS — I7121 Aneurysm of the ascending aorta, without rupture: Secondary | ICD-10-CM | POA: Diagnosis not present

## 2022-12-16 DIAGNOSIS — Z955 Presence of coronary angioplasty implant and graft: Secondary | ICD-10-CM | POA: Diagnosis not present

## 2022-12-16 DIAGNOSIS — I2510 Atherosclerotic heart disease of native coronary artery without angina pectoris: Secondary | ICD-10-CM

## 2022-12-16 DIAGNOSIS — I5032 Chronic diastolic (congestive) heart failure: Secondary | ICD-10-CM

## 2022-12-16 DIAGNOSIS — I252 Old myocardial infarction: Secondary | ICD-10-CM | POA: Diagnosis not present

## 2022-12-16 DIAGNOSIS — G4733 Obstructive sleep apnea (adult) (pediatric): Secondary | ICD-10-CM

## 2022-12-16 DIAGNOSIS — R0609 Other forms of dyspnea: Secondary | ICD-10-CM | POA: Diagnosis not present

## 2022-12-16 DIAGNOSIS — Z79899 Other long term (current) drug therapy: Secondary | ICD-10-CM | POA: Diagnosis not present

## 2022-12-16 DIAGNOSIS — E119 Type 2 diabetes mellitus without complications: Secondary | ICD-10-CM | POA: Diagnosis not present

## 2022-12-16 DIAGNOSIS — Z794 Long term (current) use of insulin: Secondary | ICD-10-CM

## 2022-12-16 DIAGNOSIS — Z789 Other specified health status: Secondary | ICD-10-CM

## 2022-12-16 MED ORDER — SEMAGLUTIDE(0.25 OR 0.5MG/DOS) 2 MG/3ML ~~LOC~~ SOPN
0.2500 mg | PEN_INJECTOR | SUBCUTANEOUS | 0 refills | Status: AC
Start: 2022-12-16 — End: 2023-01-07

## 2022-12-16 MED ORDER — LOSARTAN POTASSIUM 25 MG PO TABS
25.0000 mg | ORAL_TABLET | Freq: Every day | ORAL | Status: DC
Start: 2022-12-16 — End: 2023-01-19

## 2022-12-16 NOTE — Progress Notes (Signed)
Joshua Lara Date of Birth: [birth year] MRN: [id number] Primary Care Provider:[name], Aldean Jewett Former Cardiology Providers: [place], APRN, FNP-C Primary Cardiologist: Tessa Lerner, DO, [facility] (established care 10/28/2019)  Date: 12/16/22 Last Office Visit: 12/04/2022  Chief Complaint  Patient presents with   Chronic heart failure with preserved ejection fraction (HFp   Follow-up    HPI  Joshua Lara is a 77 y.o.  male whose past medical history and cardiovascular risk factors include: Acute inferolateral STEMI status post thrombectomy and stenting (December 07, 2022), Paroxysmal atrial fibrillation (status post cardioversion July 2024), heart failure with improved EF, chronic HFpEF, ascending aortic aneurysm 42 mm (CT Chest 07/04/2022), hypertension, hyperlipidemia, OSA on BiPAP, diabetes mellitus, glaucoma, obesity due to excess calories, advanced age.  Was being followed by the practice for HFpEF, A-fib, and hyperlipidemia given intolerance to statin therapy  Since last office visit patient presented to [facility], ED with acute chest pain and ruled in for STEMI.  EKG illustrated acute inferolateral STEMI he was taken to Cath Lab emergently and underwent aspiration thrombectomy followed by direct stenting with 3.5 x 24 mm DES.  He was admitted to cardiovascular ICU for further monitoring.  During heart catheterization he went into A-fib with RVR and since he was anticoagulated without interruptions for 4 weeks he underwent direct-current cardioversion was back to sinus rhythm.  He was having soft blood pressures during his hospitalization morning cortisol was low concerning for adrenal insufficiency.  He was not discharged on GDMT due to soft blood pressures.  Since discharge he did have labs done on 12/12/2022 which notes renal function back to baseline NT proBNP is within normal limits.  EKG today illustrates sinus without underlying ischemia or injury pattern.  Since discharge  he has not had any anginal chest pain but has nonspecific precordial discomfort.  He still has shortness of breath at rest and with effort related activities.  He followed up with his pulmonologist in [place].  I do not have records available for review.   ALLERGIES: Allergies  Allergen Reactions   Bactrim [Sulfamethoxazole-Trimethoprim] Swelling   Ranitidine Hcl Anaphylaxis and Other (See Comments)    Has anaphylactic shock--intubated and in ICU x 4 days   Adalimumab Other (See Comments)   Etanercept Other (See Comments) and Cough    (ENBREL); Patient thinks it contributed to heart issues   Etanercept     Other reaction(s): Cough (ALLERGY/intolerance), Other (See Comments) (ENBREL); Patient thinks it contributed to heart issues   Other Other (See Comments)    Tested allergic   Sulfa Antibiotics Other (See Comments)   Tape Itching    Can only tolerate bandages for less than 24-hr periods    Walnut Other (See Comments)    Tested allergic- Black walnuts    MEDICATION LIST PRIOR TO VISIT: Current Outpatient Medications on File Prior to Visit  Medication Sig Dispense Refill   acetaminophen (TYLENOL) 650 MG CR tablet Take 650 mg by mouth every 8 (eight) hours as needed for pain.     albuterol (VENTOLIN HFA) 108 (90 Base) MCG/ACT inhaler Inhale 1-2 puffs into the lungs every 4 (four) hours as needed for wheezing or shortness of breath.     apixaban (ELIQUIS) 5 MG TABS tablet TAKE 1 TABLET BY MOUTH EVERY 12 HOURS (Patient taking differently: Take 5 mg by mouth every 12 (twelve) hours.) 60 tablet 10   Armodafinil 150 MG tablet Take 150 mg by mouth daily.     aspirin EC 81 MG tablet  Take 1 tablet (81 mg total) by mouth daily. Swallow whole. 30 tablet 12   BD PEN NEEDLE NANO 2ND GEN 32G X 4 MM MISC      clobetasol cream (TEMOVATE) 0.05 % Apply 1 Application topically as needed (breakouts on left arm).     clopidogrel (PLAVIX) 75 MG tablet Take 1 tablet (75 mg total) by mouth daily. 90 tablet  1   Continuous Glucose Sensor (FREESTYLE LIBRE 2 SENSOR) MISC Inject 1 Device into the skin every 14 (fourteen) days.     cyanocobalamin (VITAMIN B12) 500 MCG tablet Take 500 mcg by mouth daily.     Evolocumab (REPATHA SURECLICK) 140 MG/ML SOAJ INJECT 140 MG INTO THE SKIN EVERY 14 (FOURTEEN) DAYS (Patient taking differently: Inject 140 mg into the skin every 14 (fourteen) days.) 2 mL 1   fenofibrate (TRICOR) 145 MG tablet TAKE 1 TABLET BY MOUTH EVERY DAY (Patient taking differently: Take 145 mg by mouth daily.) 90 tablet 1   fluticasone (FLONASE) 50 MCG/ACT nasal spray Place 1 spray into both nostrils 2 (two) times daily.     folic acid (FOLVITE) 1 MG tablet Take 1 mg by mouth daily.     HUMULIN R U-500 KWIKPEN 500 UNIT/ML kwikpen Inject 125-150 Units into the skin 2 (two) times daily.      latanoprost (XALATAN) 0.005 % ophthalmic solution Place 1 drop into both eyes in the morning and at bedtime.     levothyroxine (SYNTHROID, LEVOTHROID) 100 MCG tablet Take 100 mcg by mouth daily.     methotrexate 2.5 MG tablet Take 2.5 mg by mouth once a week.     Multiple Vitamins-Minerals (CENTRUM SILVER PO) Take 1 tablet by mouth daily.     pantoprazole (PROTONIX) 40 MG tablet Take 40 mg by mouth daily.     pravastatin (PRAVACHOL) 40 MG tablet TAKE 1 TABLET BY MOUTH DAILY AT 6 PM. 90 tablet 3   sotalol (BETAPACE) 80 MG tablet Take 0.5 tablets (40 mg total) by mouth 2 (two) times daily.     tamsulosin (FLOMAX) 0.4 MG CAPS capsule Take 0.4 mg by mouth daily after supper.     Vitamin D, Ergocalciferol, (DRISDOL) 1.25 MG (50000 UNIT) CAPS capsule Take 50,000 Units by mouth once a week.     nitroGLYCERIN (NITROSTAT) 0.4 MG SL tablet Place 1 tablet (0.4 mg total) under the tongue every 5 (five) minutes as needed for chest pain. If you require more than two tablets five minutes apart go to the nearest ER via EMS. 30 tablet 0   No current facility-administered medications on file prior to visit.    PAST MEDICAL  HISTORY: Past Medical History:  Diagnosis Date   Arthritis    "hands, back, ankles" (05/11/2014)   CHF (congestive heart failure) (HCC)    Chronic lower back pain    Compression fracture of lumbar vertebra (HCC)    Coronary artery disease    Daily headache    "recently" (05/11/2014)   Depression    "I might be slight" (05/11/2014)   GERD (gastroesophageal reflux disease)    High cholesterol    Hypertension    Hypothyroid    OSA treated with BiPAP    Pneumonia    "couple times; last time was 03/2011" (05/11/2014)   Type II diabetes mellitus (HCC)     PAST SURGICAL HISTORY: Past Surgical History:  Procedure Laterality Date   APPENDECTOMY     CARDIAC CATHETERIZATION  2001; 2009   CARDIOVERSION N/A 12/06/2022   Procedure:  CARDIOVERSION;  Surgeon: [name], MD;  Location: [facility] INVASIVE CV LAB;  Service: Cardiovascular;  Laterality: N/A;   CATARACT EXTRACTION W/ INTRAOCULAR LENS IMPLANT Left    CHOLECYSTECTOMY  1980's   Hattie Perch 07/13/2010    CORONARY/GRAFT ACUTE MI REVASCULARIZATION N/A 12/06/2022   Procedure: Coronary/Graft Acute MI Revascularization;  Surgeon: [name], MD;  Location[facility] INVASIVE CV LAB;  Service: Cardiovascular;  Laterality: N/A;   EYE MUSCLE SURGERY Left    LEFT AND RIGHT HEART CATHETERIZATION WITH CORONARY ANGIOGRAM N/A 05/30/2014   Procedure: LEFT AND RIGHT HEART CATHETERIZATION WITH CORONARY ANGIOGRAM;  Surgeon: Dolores Patty, MD;  Location: [facility] CATH LAB;  Service: Cardiovascular;  Laterality: N/A;   LEFT HEART CATH AND CORONARY ANGIOGRAPHY N/A 12/20/2018   Procedure: LEFT HEART CATH AND CORONARY ANGIOGRAPHY and possible intervention;  Surgeon: [name], MD;  Location: [facility] INVASIVE CV LAB;  Service: Cardiovascular;  Laterality: N/A;   LEFT HEART CATH AND CORONARY ANGIOGRAPHY N/A 12/06/2022   Procedure: LEFT HEART CATH AND CORONARY ANGIOGRAPHY;  Surgeon: [name], MD;  Location: [facility] INVASIVE CV LAB;  Service: Cardiovascular;  Laterality: N/A;   MASTOIDECTOMY  Right 1970's   /notes 07/13/2010   SHOULDER OPEN ROTATOR CUFF REPAIR Left 07/2010   SPHINCTEROTOMY     TEMPORARY PACEMAKER N/A 12/06/2022   Procedure: TEMPORARY PACEMAKER;  Surgeon: [name], MD;  Location: [facility] INVASIVE CV LAB;  Service: Cardiovascular;  Laterality: N/A;   TONSILLECTOMY AND ADENOIDECTOMY      FAMILY HISTORY: The patient's family history includes Esophageal cancer in his brother; Heart attack in his father; Kidney failure in his mother.   SOCIAL HISTORY:  The patient  reports that he quit smoking about 32 years ago. His smoking use included cigarettes. He started smoking about 62 years ago. He has a 60 pack-year smoking history. He has never used smokeless tobacco. He reports that he does not currently use alcohol. He reports that he does not use drugs.  Review of Systems  Constitutional: Positive for malaise/fatigue. Negative for chills and fever.  HENT:  Negative for hoarse voice and nosebleeds.   Eyes:  Negative for discharge, double vision and pain.  Cardiovascular:  Positive for dyspnea on exertion (chronic). Negative for chest pain, claudication, leg swelling, near-syncope, orthopnea, palpitations, paroxysmal nocturnal dyspnea and syncope.  Respiratory:  Positive for shortness of breath. Negative for hemoptysis.   Musculoskeletal:  Negative for muscle cramps and myalgias.  Gastrointestinal:  Negative for abdominal pain, constipation, diarrhea, hematemesis, hematochezia, melena, nausea and vomiting.  Neurological:  Negative for dizziness, light-headedness and vertigo.    PHYSICAL EXAM:    12/16/2022   12:33 PM 12/09/2022    1:00 PM 12/09/2022   12:00 PM  Vitals with BMI  Height 5\' 10"     Weight 274 lbs    BMI 39.32    Systolic 130 110 778  Diastolic 76 65 62  Pulse 90 91 98    Physical Exam Cardiovascular:     Rate and Rhythm: Normal rate and regular rhythm.     Pulses:          Carotid pulses are 2+ on the right side and 2+ on the left side.      Radial  pulses are 2+ on the right side and 2+ on the left side.       Dorsalis pedis pulses are 2+ on the right side and 2+ on the left side.       Posterior tibial pulses are 1+ on the right side  and 1+ on the left side.     Heart sounds: Normal heart sounds. No murmur heard.    No gallop.  Pulmonary:     Effort: Pulmonary effort is normal.     Breath sounds: Normal breath sounds. No wheezing or rales.  Musculoskeletal:     Right lower leg: No edema.     Left lower leg: No edema.  Neurological:     Mental Status: He is alert.    CARDIAC DATABASE: EKG:  December 16, 2022: Sinus rhythm, 94 bpm, first-degree AV block, right axis, without underlying injury pattern.  Echocardiogram: 01/14/2019: LVEF 45-50%, calculated LVEF 40%, grade 1 diastolic impairment, mild LAE, see report for details.   12/09/2019: EF 45-50%. Indeterminate diastolic filling pattern, elevated LAP. See report for details.   04/11/2022: LVEF 60 to 65%, mild LVH, no significant valvular heart disease.  See report for additional details.  04/26/024:   1. Left ventricular ejection fraction, by estimation, is 60 to 65%. The left ventricle has normal function. The left ventricle has no regional wall motion abnormalities. There is mild left ventricular hypertrophy. Left ventricular diastolic parameters are consistent with Grade I diastolic dysfunction (impaired relaxation).   2. Right ventricular systolic function is normal. The right ventricular size is normal.   3. The mitral valve is normal in structure. No evidence of mitral valve regurgitation. No evidence of mitral stenosis.   4. The aortic valve is tricuspid. There is mild calcification of the aortic valve. There is mild thickening of the aortic valve. Aortic valve regurgitation is not visualized. Aortic valve sclerosis is present, with no evidence of aortic valve stenosis.   5. Aortic dilatation noted. There is moderate dilatation of the ascending aorta, measuring 42 mm.   6.  The inferior vena cava is normal in size with greater than 50% respiratory variability, suggesting right atrial pressure of 3 mmHg.    11/19/2022:   1. Left ventricular ejection fraction, by estimation, is 55 to 60%. The left ventricle has normal function. The left ventricle has no regional wall motion abnormalities. There is mild left ventricular hypertrophy. Left ventricular diastolic parameters  are consistent with Grade III diastolic dysfunction (restrictive). Elevated left ventricular end-diastolic pressure.  2. Right ventricular systolic function is normal. The right ventricular size is normal.  3. The mitral valve is normal in structure. No evidence of mitral valve regurgitation. No evidence of mitral stenosis.  4. The aortic valve is grossly normal. Aortic valve regurgitation is not visualized. Aortic valve sclerosis is present, with no evidence of aortic valve stenosis.  5. Aortic dilatation noted. There is dilatation of the ascending aorta, measuring 39 mm. There is dilatation of the aortic root, measuring 43 mm.  6. The inferior vena cava is normal in size with greater than 50% respiratory variability, suggesting right atrial pressure of 3 mmHg.  Stress Testing:  Lexiscan/modified Bruce Tetrofosmin stress test 11/23/2019: Lexiscan/modified Bruce nuclear stress test performed using 1-day protocol. Stress EKG is non-diagnostic, as this is pharmacological stress test. In addition, stress EKG at 71% MPHR showed sinus rhythm, possible old anteroseptal infarct, poor R wave progression, low voltage, no ischemic changes.   Large areas of soft tissue attenuation seen, particularly in vertical long axis images. In addition, Apical thinning seen on both rest and stress images may be physiological. Overall, no reversible ischemia noted. Stress LVEF 49%, although visually appears normal.  Low risk study.   Heart Catheterization: Left Heart Catheterization 12/07/22:  LV: 109/19, EDP 27 mmHg.  Ao:  1  6/61, mean 76 mmHg.  No pressure gradient across the aortic valve on 10 mcg/kg/min of dopamine. LM: Large-caliber vessel.  Smooth and normal. LAD: Large-caliber vessel, gives origin to large D1.  Smooth and normal. Ramus intermediate: Moderate caliber vessel.  Smooth and normal. CX: Moderate caliber vessel.  Smooth and normal. RCA: Large-caliber vessel, gives origin to a large PDA and 2 small PL branches originating close by to each other, there is an ulcerated 100% occlusion of the distal right coronary artery.    Intervention data: Successful thrombectomy with Pronto aspiration followed by direct stenting with 3.5 x 24 mm Synergy XD DES at 13 atmospheric pressure, stenosis reduced from 100% to 0% TIMI-0 flow improved to TIMI-3 flow.   LABORATORY DATA:    Latest Ref Rng & Units 12/07/2022    4:50 AM 12/07/2022   12:24 AM 12/06/2022   11:34 PM  CBC  WBC 4.0 - 10.5 K/uL 8.3   8.5   Hemoglobin 13.0 - 17.0 g/dL 16.1  09.6  04.5   Hematocrit 39.0 - 52.0 % 41.3  42.0  42.2   Platelets 150 - 400 K/uL 216   251    External Labs: Collected: 06/07/2021 provided by PCP. BUN 11, creatinine 0.9. eGFR 90. Sodium 141, potassium 4, chloride 103, bicarb 27 AST 23, ALT 27, alkaline phosphatase 113 Hemoglobin 16.9 g/dL, hematocrit 40.9% BNP 8.2 Total cholesterol 173, triglycerides 259, HDL 42, LDL 88  External Labs: Collected: 08/13/2022 provided by PCP BUN 16, creatinine 1.08. BUN to creatinine ratio 15. Sodium 135, potassium 4.4, chloride 99, bicarb 23. AST 26, ALT 24, alkaline phosphatase 64 Hemoglobin 15.1, hematocrit 43.9% BNP 4.4  Lab Results  Component Value Date   CHOL 93 12/08/2022   HDL 39 (L) 12/08/2022   LDLCALC 30 12/08/2022   LDLDIRECT 35 12/08/2022   TRIG 118 12/08/2022   CHOLHDL 2.4 12/08/2022     IMPRESSION:    ICD-10-CM   1. Atherosclerosis of native coronary artery of native heart without angina pectoris  I25.10 Semaglutide,0.25 or 0.5MG /DOS, 2 MG/3ML SOPN    2.  History of ST elevation myocardial infarction (STEMI)  I25.2     3. Coronary stent patent  Z95.5 Semaglutide,0.25 or 0.5MG /DOS, 2 MG/3ML SOPN    4. Heart failure with improved ejection fraction (HFimpEF) (HCC)  I50.32     5. Chronic heart failure with preserved ejection fraction (HFpEF) (HCC)  I50.32 EKG 12-Lead    losartan (COZAAR) 25 MG tablet    6. Dyspnea on exertion  R06.09     7. Aneurysm of ascending aorta without rupture (HCC)  I71.21     8. Paroxysmal atrial fibrillation (HCC)  I48.0     9. Long term (current) use of anticoagulants  Z79.01     10. Long term current use of antiarrhythmic drug  Z79.899     11. Type 2 diabetes mellitus without complication, with long-term current use of insulin (HCC)  E11.9 Semaglutide,0.25 or 0.5MG /DOS, 2 MG/3ML SOPN   Z79.4     12. Pure hypercholesterolemia  E78.00     13. Hypertriglyceridemia  E78.1     14. Statin intolerance  Z78.9     15. OSA treated with BiPAP  G47.33          RECOMMENDATIONS: DUONG MESERVEY is a 77 y.o. male whose past medical history and cardiovascular risk factors include: Acute inferolateral STEMI status post thrombectomy and stenting (December 07, 2022), Paroxysmal atrial fibrillation (status post cardioversion July 2024), heart failure with  improved EF, chronic HFpEF, ascending aortic aneurysm 42 mm (CT Chest 07/04/2022), hypertension, hyperlipidemia, OSA on BiPAP, diabetes mellitus, glaucoma, obesity due to excess calories, advanced age.  Atherosclerosis of native coronary artery of native heart without angina pectoris History of ST elevation myocardial infarction (STEMI) Coronary stent patent Index event 12/07/2022. Status post thrombectomy followed by direct stenting with 3.5 x 24 mm Synergy DES. Recommend triple therapy for 1 month. Stop aspirin 01/07/2023. Recommended Plavix and Eliquis for at least 6 months to year if able to tolerate; otherwise at least 3 months of dual antiplatelet therapy.  Followed  by Eliquis and aspirin. Hospitalization records reviewed. No sublingual nitroglycerin tablet since last office encounter. Given CAD, diabetes, obesity will start him on Ozempic. When he starts Ozempic he will stop taking Trulicity. He will follow-up with endocrinology with regards to any changes to his diabetic regimen to avoid hypoglycemia. Monitor for now  Heart failure with improved ejection fraction (HFimpEF) (HCC) Chronic heart failure with preserved ejection fraction (HFpEF) (HCC) July 2021 LVEF 45-50% see report for additional details. July 2024: LVEF 55 to 60%, grade 3 diastolic dysfunction, see report for additional details Uptitration of GDMT has been difficult due to soft blood pressures. Patient was noted to have low cortisol level during the hospitalization raising the suspicion for adrenal insufficiency.  Patient states that he was on steroids intermittently with pulmonary medicine.  I have asked him to discuss this further with endocrinology. Will restart low-dose losartan 25 mg p.o. daily.  With holding parameters Patient is asked to keep a log of his blood pressures. Jardiance in the past discontinued due to yeast infection.  Dyspnea on exertion Multifactorial Has undergone a very thorough cardiovascular workup as outlined above. I have asked him to follow-up with pulmonary medicine for evaluation of worsening dyspnea.   He is also on methotrexate and should be evaluated for lung toxicity. Monitor for now  Aneurysm of ascending aorta without rupture [facility]) CT chest at [facility] February 2024-per report ascending aorta 42 mm. Echocardiogram July 2024-ascending aorta 43 mm. Blood pressures are well-controlled. Annual follow-up recommended by radiology-CT of the chest scheduled for March 2025. Avoid medications such as ciprofloxacin and heavy lifting.  Paroxysmal atrial fibrillation (HCC) Long term (current) use of anticoagulants Long term current use of  antiarrhythmic drug Rate control: Sotalol. Rhythm control: Sotalol. Thromboembolic prophylaxis: Eliquis. During the hospitalization sotalol dose was reduced to 40 mg p.o. twice daily due to soft blood pressures. EKG today illustrates sinus rhythm. Does not endorse evidence of bleeding. CHA2DS2-VASc SCORE is 6 which correlates to 9.7% risk of stroke per year (DM, aortic atherosclerosis, HTN, CHF, age).   Type 2 diabetes mellitus without complication, with long-term current use of insulin (HCC) With complications of glaucoma, CHF, CAD Reemphasized importance of secondary prevention. Will start Ozempic-see above  Pure hypercholesterolemia Hypertriglyceridemia Statin intolerance Currently on maximally tolerated dose of statin Continue Repatha. Direct LDL is at goal. Most recent lipid profile from July 2024 independently reviewed and noted above  OSA treated with BiPAP Reemphasized importance of device compliance. He was supposed to be started on nocturnal oxygen, per patient.  FINAL MEDICATION LIST END OF ENCOUNTER: Meds ordered this encounter  Medications   Semaglutide,0.25 or 0.5MG /DOS, 2 MG/3ML SOPN    Sig: Inject 0.25 mg into the skin once a week for 4 doses.    Dispense:  3 mL    Refill:  0   losartan (COZAAR) 25 MG tablet    Sig: Take 1 tablet (25  mg total) by mouth daily. Hold if systolic blood pressure (top number) less than 100 mmHg or pulse less than 60 bpm.     Current Outpatient Medications:    acetaminophen (TYLENOL) 650 MG CR tablet, Take 650 mg by mouth every 8 (eight) hours as needed for pain., Disp: , Rfl:    albuterol (VENTOLIN HFA) 108 (90 Base) MCG/ACT inhaler, Inhale 1-2 puffs into the lungs every 4 (four) hours as needed for wheezing or shortness of breath., Disp: , Rfl:    apixaban (ELIQUIS) 5 MG TABS tablet, TAKE 1 TABLET BY MOUTH EVERY 12 HOURS (Patient taking differently: Take 5 mg by mouth every 12 (twelve) hours.), Disp: 60 tablet, Rfl: 10    Armodafinil 150 MG tablet, Take 150 mg by mouth daily., Disp: , Rfl:    aspirin EC 81 MG tablet, Take 1 tablet (81 mg total) by mouth daily. Swallow whole., Disp: 30 tablet, Rfl: 12   BD PEN NEEDLE NANO 2ND GEN 32G X 4 MM MISC, , Disp: , Rfl:    clobetasol cream (TEMOVATE) 0.05 %, Apply 1 Application topically as needed (breakouts on left arm)., Disp: , Rfl:    clopidogrel (PLAVIX) 75 MG tablet, Take 1 tablet (75 mg total) by mouth daily., Disp: 90 tablet, Rfl: 1   Continuous Glucose Sensor (FREESTYLE LIBRE 2 SENSOR) MISC, Inject 1 Device into the skin every 14 (fourteen) days., Disp: , Rfl:    cyanocobalamin (VITAMIN B12) 500 MCG tablet, Take 500 mcg by mouth daily., Disp: , Rfl:    Evolocumab (REPATHA SURECLICK) 140 MG/ML SOAJ, INJECT 140 MG INTO THE SKIN EVERY 14 (FOURTEEN) DAYS (Patient taking differently: Inject 140 mg into the skin every 14 (fourteen) days.), Disp: 2 mL, Rfl: 1   fenofibrate (TRICOR) 145 MG tablet, TAKE 1 TABLET BY MOUTH EVERY DAY (Patient taking differently: Take 145 mg by mouth daily.), Disp: 90 tablet, Rfl: 1   fluticasone (FLONASE) 50 MCG/ACT nasal spray, Place 1 spray into both nostrils 2 (two) times daily., Disp: , Rfl:    folic acid (FOLVITE) 1 MG tablet, Take 1 mg by mouth daily., Disp: , Rfl:    HUMULIN R U-500 KWIKPEN 500 UNIT/ML kwikpen, Inject 125-150 Units into the skin 2 (two) times daily. , Disp: , Rfl:    latanoprost (XALATAN) 0.005 % ophthalmic solution, Place 1 drop into both eyes in the morning and at bedtime., Disp: , Rfl:    levothyroxine (SYNTHROID, LEVOTHROID) 100 MCG tablet, Take 100 mcg by mouth daily., Disp: , Rfl:    losartan (COZAAR) 25 MG tablet, Take 1 tablet (25 mg total) by mouth daily. Hold if systolic blood pressure (top number) less than 100 mmHg or pulse less than 60 bpm., Disp: , Rfl:    methotrexate 2.5 MG tablet, Take 2.5 mg by mouth once a week., Disp: , Rfl:    Multiple Vitamins-Minerals (CENTRUM SILVER PO), Take 1 tablet by mouth  daily., Disp: , Rfl:    pantoprazole (PROTONIX) 40 MG tablet, Take 40 mg by mouth daily., Disp: , Rfl:    pravastatin (PRAVACHOL) 40 MG tablet, TAKE 1 TABLET BY MOUTH DAILY AT 6 PM., Disp: 90 tablet, Rfl: 3   Semaglutide,0.25 or 0.5MG /DOS, 2 MG/3ML SOPN, Inject 0.25 mg into the skin once a week for 4 doses., Disp: 3 mL, Rfl: 0   sotalol (BETAPACE) 80 MG tablet, Take 0.5 tablets (40 mg total) by mouth 2 (two) times daily., Disp: , Rfl:    tamsulosin (FLOMAX) 0.4 MG CAPS capsule, Take  0.4 mg by mouth daily after supper., Disp: , Rfl:    Vitamin D, Ergocalciferol, (DRISDOL) 1.25 MG (50000 UNIT) CAPS capsule, Take 50,000 Units by mouth once a week., Disp: , Rfl:    nitroGLYCERIN (NITROSTAT) 0.4 MG SL tablet, Place 1 tablet (0.4 mg total) under the tongue every 5 (five) minutes as needed for chest pain. If you require more than two tablets five minutes apart go to the nearest ER via EMS., Disp: 30 tablet, Rfl: 0  Orders Placed This Encounter  Procedures   EKG 12-Lead   --Continue cardiac medications as reconciled in final medication list. --Return in about 4 weeks (around 01/13/2023) for Follow up recent STEMI and Dyspena . Or sooner if needed. --Continue follow-up with your primary care physician regarding the management of your other chronic comorbid conditions.  Patient's questions and concerns were addressed to his satisfaction. He voices understanding of the instructions provided during this encounter.   This note was created using a voice recognition software as a result there may be grammatical errors inadvertently enclosed that do not reflect the nature of this encounter. Every attempt is made to correct such errors.   Tessa Lerner, [place], [facility]  Pager:  [phone] Office: [phone]

## 2022-12-18 ENCOUNTER — Ambulatory Visit: Payer: PPO | Admitting: Cardiology

## 2022-12-18 DIAGNOSIS — E039 Hypothyroidism, unspecified: Secondary | ICD-10-CM | POA: Diagnosis not present

## 2022-12-18 DIAGNOSIS — E1142 Type 2 diabetes mellitus with diabetic polyneuropathy: Secondary | ICD-10-CM | POA: Diagnosis not present

## 2022-12-18 DIAGNOSIS — E1136 Type 2 diabetes mellitus with diabetic cataract: Secondary | ICD-10-CM | POA: Diagnosis not present

## 2022-12-18 DIAGNOSIS — Z92241 Personal history of systemic steroid therapy: Secondary | ICD-10-CM | POA: Diagnosis not present

## 2022-12-18 DIAGNOSIS — R7989 Other specified abnormal findings of blood chemistry: Secondary | ICD-10-CM | POA: Diagnosis not present

## 2022-12-18 DIAGNOSIS — H409 Unspecified glaucoma: Secondary | ICD-10-CM | POA: Diagnosis not present

## 2022-12-18 DIAGNOSIS — Z794 Long term (current) use of insulin: Secondary | ICD-10-CM | POA: Diagnosis not present

## 2022-12-23 DIAGNOSIS — Z0000 Encounter for general adult medical examination without abnormal findings: Secondary | ICD-10-CM | POA: Diagnosis not present

## 2022-12-23 DIAGNOSIS — Z1331 Encounter for screening for depression: Secondary | ICD-10-CM | POA: Diagnosis not present

## 2022-12-23 DIAGNOSIS — Z9181 History of falling: Secondary | ICD-10-CM | POA: Diagnosis not present

## 2022-12-23 DIAGNOSIS — Z1211 Encounter for screening for malignant neoplasm of colon: Secondary | ICD-10-CM | POA: Diagnosis not present

## 2022-12-29 ENCOUNTER — Other Ambulatory Visit: Payer: Self-pay | Admitting: Cardiology

## 2022-12-29 DIAGNOSIS — Z789 Other specified health status: Secondary | ICD-10-CM

## 2022-12-29 DIAGNOSIS — E7800 Pure hypercholesterolemia, unspecified: Secondary | ICD-10-CM

## 2022-12-29 DIAGNOSIS — E119 Type 2 diabetes mellitus without complications: Secondary | ICD-10-CM

## 2022-12-29 DIAGNOSIS — I5022 Chronic systolic (congestive) heart failure: Secondary | ICD-10-CM

## 2022-12-31 ENCOUNTER — Encounter: Payer: Self-pay | Admitting: *Deleted

## 2022-12-31 ENCOUNTER — Encounter: Payer: PPO | Attending: Cardiology | Admitting: *Deleted

## 2022-12-31 DIAGNOSIS — Z955 Presence of coronary angioplasty implant and graft: Secondary | ICD-10-CM

## 2022-12-31 DIAGNOSIS — I213 ST elevation (STEMI) myocardial infarction of unspecified site: Secondary | ICD-10-CM

## 2022-12-31 NOTE — Progress Notes (Signed)
Virtual orientation call completed today. he has an appointment on Date: 01/14/2023  for EP eval and gym Orientation.  Documentation of diagnosis can be found in [facility] 12/06/2022 .

## 2023-01-05 DIAGNOSIS — Z92241 Personal history of systemic steroid therapy: Secondary | ICD-10-CM | POA: Diagnosis not present

## 2023-01-05 DIAGNOSIS — T380X5A Adverse effect of glucocorticoids and synthetic analogues, initial encounter: Secondary | ICD-10-CM | POA: Diagnosis not present

## 2023-01-06 DIAGNOSIS — H6123 Impacted cerumen, bilateral: Secondary | ICD-10-CM | POA: Diagnosis not present

## 2023-01-06 DIAGNOSIS — H9012 Conductive hearing loss, unilateral, left ear, with unrestricted hearing on the contralateral side: Secondary | ICD-10-CM | POA: Diagnosis not present

## 2023-01-09 DIAGNOSIS — J449 Chronic obstructive pulmonary disease, unspecified: Secondary | ICD-10-CM | POA: Diagnosis not present

## 2023-01-14 ENCOUNTER — Encounter: Payer: PPO | Attending: Cardiology

## 2023-01-14 VITALS — Ht 69.2 in | Wt 270.5 lb

## 2023-01-14 DIAGNOSIS — Z5189 Encounter for other specified aftercare: Secondary | ICD-10-CM | POA: Diagnosis not present

## 2023-01-14 DIAGNOSIS — I252 Old myocardial infarction: Secondary | ICD-10-CM | POA: Diagnosis not present

## 2023-01-14 DIAGNOSIS — Z955 Presence of coronary angioplasty implant and graft: Secondary | ICD-10-CM | POA: Diagnosis not present

## 2023-01-14 DIAGNOSIS — I213 ST elevation (STEMI) myocardial infarction of unspecified site: Secondary | ICD-10-CM

## 2023-01-14 NOTE — Telephone Encounter (Signed)
This encounter was created in error - please disregard.

## 2023-01-14 NOTE — Progress Notes (Signed)
Cardiac Individual Treatment Plan  Patient Details  Name: Joshua Lara MRN: [id number] Date of Birth: [birth year] Referring Provider:   Flowsheet Row Cardiac Rehab from 01/14/2023 in [facility] Cardiac and Pulmonary Rehab  Referring Provider [name]       Initial Encounter Date:  Flowsheet Row Cardiac Rehab from 01/14/2023 in [facility] Cardiac and Pulmonary Rehab  Date 01/14/23       Visit Diagnosis: ST elevation myocardial infarction (STEMI), unspecified artery [facility])  Status post coronary artery stent placement  Patient's Home Medications on Admission:  Current Outpatient Medications:    acetaminophen (TYLENOL) 650 MG CR tablet, Take 650 mg by mouth every 8 (eight) hours as needed for pain., Disp: , Rfl:    albuterol (VENTOLIN HFA) 108 (90 Base) MCG/ACT inhaler, Inhale 1-2 puffs into the lungs every 4 (four) hours as needed for wheezing or shortness of breath., Disp: , Rfl:    apixaban (ELIQUIS) 5 MG TABS tablet, TAKE 1 TABLET BY MOUTH EVERY 12 HOURS (Patient taking differently: Take 5 mg by mouth every 12 (twelve) hours.), Disp: 60 tablet, Rfl: 10   Armodafinil 150 MG tablet, Take 150 mg by mouth daily., Disp: , Rfl:    aspirin EC 81 MG tablet, Take 1 tablet (81 mg total) by mouth daily. Swallow whole., Disp: 30 tablet, Rfl: 12   BD PEN NEEDLE NANO 2ND GEN 32G X 4 MM MISC, , Disp: , Rfl:    clobetasol cream (TEMOVATE) 0.05 %, Apply 1 Application topically as needed (breakouts on left arm)., Disp: , Rfl:    clopidogrel (PLAVIX) 75 MG tablet, Take 1 tablet (75 mg total) by mouth daily., Disp: 90 tablet, Rfl: 1   Continuous Glucose Sensor (FREESTYLE LIBRE 2 SENSOR) MISC, Inject 1 Device into the skin every 14 (fourteen) days., Disp: , Rfl:    cyanocobalamin (VITAMIN B12) 500 MCG tablet, Take 500 mcg by mouth daily., Disp: , Rfl:    Evolocumab (REPATHA SURECLICK) 140 MG/ML SOAJ, Inject 140 mg into the skin every 14 (fourteen) days., Disp: 6 mL, Rfl: 1   fenofibrate (TRICOR) 145 MG tablet, TAKE  1 TABLET BY MOUTH EVERY DAY (Patient taking differently: Take 145 mg by mouth daily.), Disp: 90 tablet, Rfl: 1   fluticasone (FLONASE) 50 MCG/ACT nasal spray, Place 1 spray into both nostrils 2 (two) times daily., Disp: , Rfl:    folic acid (FOLVITE) 1 MG tablet, Take 1 mg by mouth daily., Disp: , Rfl:    HUMULIN R U-500 KWIKPEN 500 UNIT/ML kwikpen, Inject 125-150 Units into the skin 2 (two) times daily. , Disp: , Rfl:    latanoprost (XALATAN) 0.005 % ophthalmic solution, Place 1 drop into both eyes in the morning and at bedtime., Disp: , Rfl:    levothyroxine (SYNTHROID, LEVOTHROID) 100 MCG tablet, Take 100 mcg by mouth daily., Disp: , Rfl:    losartan (COZAAR) 25 MG tablet, Take 1 tablet (25 mg total) by mouth daily. Hold if systolic blood pressure (top number) less than 100 mmHg or pulse less than 60 bpm., Disp: , Rfl:    methotrexate 2.5 MG tablet, Take 2.5 mg by mouth once a week., Disp: , Rfl:    Multiple Vitamins-Minerals (CENTRUM SILVER PO), Take 1 tablet by mouth daily., Disp: , Rfl:    nitroGLYCERIN (NITROSTAT) 0.4 MG SL tablet, Place 1 tablet (0.4 mg total) under the tongue every 5 (five) minutes as needed for chest pain. If you require more than two tablets five minutes apart go to the nearest ER  via EMS., Disp: 30 tablet, Rfl: 0   pantoprazole (PROTONIX) 40 MG tablet, Take 40 mg by mouth daily., Disp: , Rfl:    pravastatin (PRAVACHOL) 40 MG tablet, TAKE 1 TABLET BY MOUTH DAILY AT 6 PM., Disp: 90 tablet, Rfl: 3   sotalol (BETAPACE) 80 MG tablet, Take 0.5 tablets (40 mg total) by mouth 2 (two) times daily., Disp: , Rfl:    tamsulosin (FLOMAX) 0.4 MG CAPS capsule, Take 0.4 mg by mouth daily after supper., Disp: , Rfl:    Vitamin D, Ergocalciferol, (DRISDOL) 1.25 MG (50000 UNIT) CAPS capsule, Take 50,000 Units by mouth once a week., Disp: , Rfl:   Past Medical History: Past Medical History:  Diagnosis Date   Arthritis    "hands, back, ankles" (05/11/2014)   CHF (congestive heart  failure) (HCC)    Chronic lower back pain    Compression fracture of lumbar vertebra (HCC)    Coronary artery disease    Daily headache    "recently" (05/11/2014)   Depression    "I might be slight" (05/11/2014)   GERD (gastroesophageal reflux disease)    High cholesterol    Hypertension    Hypothyroid    OSA treated with BiPAP    Pneumonia    "couple times; last time was 03/2011" (05/11/2014)   Type II diabetes mellitus (HCC)     Tobacco Use: Social History   Tobacco Use  Smoking Status Former   Current packs/day: 0.00   Average packs/day: 2.0 packs/day for 30.0 years (60.0 ttl pk-yrs)   Types: Cigarettes   Start date: 05/12/1960   Quit date: 05/12/1990   Years since quitting: 32.6  Smokeless Tobacco Never    Labs: Review Flowsheet  More data exists      Latest Ref Rng & Units 11/04/2016 12/17/2018 12/06/2022 12/07/2022 12/08/2022  Labs for ITP Cardiac and Pulmonary Rehab  Cholestrol 0 - 200 mg/dL - 409  98  - 93   LDL (calc) 0 - 99 mg/dL - 61  - - 30   Direct LDL 0 - 99 mg/dL - - - 36  35   HDL-C >81 mg/dL - 59  36  - 39   Trlycerides <150 mg/dL - 191  478  - 295   Hemoglobin A1c 4.8 - 5.6 % - 6.4  7.4  7.1  -  PH, Arterial 7.350 - 7.450 7.264  - - - -  PCO2 arterial 32.0 - 48.0 mmHg 48.6  - - - -  Bicarbonate 20.0 - 28.0 mmol/L 22.0  - - - -  TCO2 22 - 32 mmol/L 23  - - 23  -  Acid-base deficit 0.0 - 2.0 mmol/L 5.0  - - - -  O2 Saturation % 96.0  - - - -    Details             Exercise Target Goals: Exercise Program Goal: Individual exercise prescription set using results from initial 6 min walk test and THRR while considering  patient's activity barriers and safety.   Exercise Prescription Goal: Initial exercise prescription builds to 30-45 minutes a day of aerobic activity, 2-3 days per week.  Home exercise guidelines will be given to patient during program as part of exercise prescription that the participant will acknowledge.   Education: Aerobic  Exercise: - Group verbal and visual presentation on the components of exercise prescription. Introduces F.I.T.T principle from ACSM for exercise prescriptions.  Reviews F.I.T.T. principles of aerobic exercise including progression. Written material given at graduation.  Flowsheet Row Cardiac Rehab from 01/14/2023 in [facility] Cardiac and Pulmonary Rehab  Education need identified 01/14/23       Education: Resistance Exercise: - Group verbal and visual presentation on the components of exercise prescription. Introduces F.I.T.T principle from ACSM for exercise prescriptions  Reviews F.I.T.T. principles of resistance exercise including progression. Written material given at graduation.    Education: Exercise & Equipment Safety: - Individual verbal instruction and demonstration of equipment use and safety with use of the equipment. Flowsheet Row Cardiac Rehab from 01/14/2023 in [facility] Cardiac and Pulmonary Rehab  Date 01/14/23  Educator MB  Instruction Review Code 1- Verbalizes Understanding       Education: Exercise Physiology & General Exercise Guidelines: - Group verbal and written instruction with models to review the exercise physiology of the cardiovascular system and associated critical values. Provides general exercise guidelines with specific guidelines to those with heart or lung disease.    Education: Flexibility, Balance, Mind/Body Relaxation: - Group verbal and visual presentation with interactive activity on the components of exercise prescription. Introduces F.I.T.T principle from ACSM for exercise prescriptions. Reviews F.I.T.T. principles of flexibility and balance exercise training including progression. Also discusses the mind body connection.  Reviews various relaxation techniques to help reduce and manage stress (i.e. Deep breathing, progressive muscle relaxation, and visualization). Balance handout provided to take home. Written material given at graduation.   Activity Barriers &  Risk Stratification:  Activity Barriers & Cardiac Risk Stratification - 01/14/23 1544       Activity Barriers & Cardiac Risk Stratification   Activity Barriers Back Problems;Arthritis;Deconditioning;Other (comment)    Comments Fatigue    Cardiac Risk Stratification Moderate             6 Minute Walk:  6 Minute Walk     Row Name 01/14/23 1542         6 Minute Walk   Phase Initial     Distance 530 feet     Walk Time 5.42 minutes     # of Rest Breaks 3     MPH 1.1     METS 0.43     RPE 15     Perceived Dyspnea  2     VO2 Peak 1.52     Symptoms Yes (comment)     Comments Back Pain, Knees     Resting HR 82 bpm     Resting BP 110/64     Resting Oxygen Saturation  95 %     Exercise Oxygen Saturation  during 6 min walk 96 %     Max Ex. HR 108 bpm     Max Ex. BP 112/80     2 Minute Post BP 110/78              Oxygen Initial Assessment:   Oxygen Re-Evaluation:   Oxygen Discharge (Final Oxygen Re-Evaluation):   Initial Exercise Prescription:  Initial Exercise Prescription - 01/14/23 1500       Date of Initial Exercise RX and Referring Provider   Date 01/14/23    Referring Provider [name]      Oxygen   Maintain Oxygen Saturation 88% or higher      Recumbant Bike   Level 1    RPM 50    Watts 15    Minutes 15    METs 0.43      NuStep   Level 1    SPM 80    Minutes 15    METs 0.43  T5 Nustep   Level 1    SPM 80    Minutes 15    METs 0.43      Biostep-RELP   Level 1    SPM 50    Minutes 15    METs 0.43      Track   Laps 5    Minutes 15    METs 1.27      Prescription Details   Frequency (times per week) 2    Duration Progress to 30 minutes of continuous aerobic without signs/symptoms of physical distress      Intensity   THRR 40-80% of Max Heartrate 106-131    Ratings of Perceived Exertion 11-13    Perceived Dyspnea 0-4      Progression   Progression Continue to progress workloads to maintain intensity without  signs/symptoms of physical distress.      Resistance Training   Training Prescription Yes    Weight 3 lb    Reps 10-15             Perform Capillary Blood Glucose checks as needed.  Exercise Prescription Changes:   Exercise Prescription Changes     Row Name 01/14/23 1500             Response to Exercise   Blood Pressure (Admit) 110/64       Blood Pressure (Exercise) 112/80       Blood Pressure (Exit) 110/78       Heart Rate (Admit) 82 bpm       Heart Rate (Exercise) 108 bpm       Heart Rate (Exit) 100 bpm       Oxygen Saturation (Admit) 95 %       Oxygen Saturation (Exercise) 96 %       Oxygen Saturation (Exit) 96 %       Rating of Perceived Exertion (Exercise) 15       Perceived Dyspnea (Exercise) 2       Symptoms Back and knee pain       Comments results         Progression   Progression Continue to progress workloads to maintain intensity without signs/symptoms of physical distress.       Average METs 0.43                Exercise Comments:   Exercise Goals and Review:   Exercise Goals     Row Name 01/14/23 1550             Exercise Goals   Increase Physical Activity Yes       Intervention Provide advice, education, support and counseling about physical activity/exercise needs.;Develop an individualized exercise prescription for aerobic and resistive training based on initial evaluation findings, risk stratification, comorbidities and participant's personal goals.       Expected Outcomes Short Term: Attend rehab on a regular basis to increase amount of physical activity.;Long Term: Exercising regularly at least 3-5 days a week.;Long Term: Add in home exercise to make exercise part of routine and to increase amount of physical activity.       Increase Strength and Stamina Yes       Intervention Provide advice, education, support and counseling about physical activity/exercise needs.;Develop an individualized exercise prescription for aerobic  and resistive training based on initial evaluation findings, risk stratification, comorbidities and participant's personal goals.       Expected Outcomes Short Term: Increase workloads from initial exercise prescription for resistance, speed, and METs.;Long  Term: Improve cardiorespiratory fitness, muscular endurance and strength as measured by increased METs and functional capacity ( );Short Term: Perform resistance training exercises routinely during rehab and add in resistance training at home       Able to understand and use rate of perceived exertion (RPE) scale Yes       Intervention Provide education and explanation on how to use RPE scale       Expected Outcomes Short Term: Able to use RPE daily in rehab to express subjective intensity level;Long Term:  Able to use RPE to guide intensity level when exercising independently       Able to understand and use Dyspnea scale Yes       Intervention Provide education and explanation on how to use Dyspnea scale       Expected Outcomes Short Term: Able to use Dyspnea scale daily in rehab to express subjective sense of shortness of breath during exertion;Long Term: Able to use Dyspnea scale to guide intensity level when exercising independently       Knowledge and understanding of Target Heart Rate Range (THRR) Yes       Intervention Provide education and explanation of THRR including how the numbers were predicted and where they are located for reference       Expected Outcomes Short Term: Able to state/look up THRR;Long Term: Able to use THRR to govern intensity when exercising independently;Short Term: Able to use daily as guideline for intensity in rehab       Able to check pulse independently Yes       Intervention Provide education and demonstration on how to check pulse in carotid and radial arteries.;Review the importance of being able to check your own pulse for safety during independent exercise       Expected Outcomes Short Term: Able to  explain why pulse checking is important during independent exercise;Long Term: Able to check pulse independently and accurately       Understanding of Exercise Prescription Yes       Intervention Provide education, explanation, and written materials on patient's individual exercise prescription       Expected Outcomes Short Term: Able to explain program exercise prescription;Long Term: Able to explain home exercise prescription to exercise independently                Exercise Goals Re-Evaluation :   Discharge Exercise Prescription (Final Exercise Prescription Changes):  Exercise Prescription Changes - 01/14/23 1500       Response to Exercise   Blood Pressure (Admit) 110/64    Blood Pressure (Exercise) 112/80    Blood Pressure (Exit) 110/78    Heart Rate (Admit) 82 bpm    Heart Rate (Exercise) 108 bpm    Heart Rate (Exit) 100 bpm    Oxygen Saturation (Admit) 95 %    Oxygen Saturation (Exercise) 96 %    Oxygen Saturation (Exit) 96 %    Rating of Perceived Exertion (Exercise) 15    Perceived Dyspnea (Exercise) 2    Symptoms Back and knee pain    Comments results      Progression   Progression Continue to progress workloads to maintain intensity without signs/symptoms of physical distress.    Average METs 0.43             Nutrition:  Target Goals: Understanding of nutrition guidelines, daily intake of sodium 1500mg , cholesterol 200mg , calories 30% from fat and 7% or less from saturated fats, daily to have 5 or more servings  of fruits and vegetables.  Education: All About Nutrition: -Group instruction provided by verbal, written material, interactive activities, discussions, models, and posters to present general guidelines for heart healthy nutrition including fat, fiber, MyPlate, the role of sodium in heart healthy nutrition, utilization of the nutrition label, and utilization of this knowledge for meal planning. Follow up email sent as well. Written material given  at graduation. Flowsheet Row Cardiac Rehab from 01/14/2023 in [facility] Cardiac and Pulmonary Rehab  Education need identified 01/14/23       Biometrics:  Pre Biometrics - 01/14/23 1551       Pre Biometrics   Height 5' 9.2" (1.758 m)    Weight 270 lb 8 oz (122.7 kg)    Waist Circumference 52 inches    Hip Circumference 52 inches    Waist to Hip Ratio 1 %    BMI (Calculated) 39.7    Single Leg Stand 4.5 seconds              Nutrition Therapy Plan and Nutrition Goals:  Nutrition Therapy & Goals - 01/14/23 1553       Personal Nutrition Goals   Nutrition Goal Meet with RD on 9/10      Intervention Plan   Intervention Prescribe, educate and counsel regarding individualized specific dietary modifications aiming towards targeted core components such as weight, hypertension, lipid management, diabetes, heart failure and other comorbidities.;Nutrition handout(s) given to patient.    Expected Outcomes Short Term Goal: Understand basic principles of dietary content, such as calories, fat, sodium, cholesterol and nutrients.;Long Term Goal: Adherence to prescribed nutrition plan.;Short Term Goal: A plan has been developed with personal nutrition goals set during dietitian appointment.             Nutrition Assessments:  MEDIFICTS Score Key: ?70 Need to make dietary changes  40-70 Heart Healthy Diet ? 40 Therapeutic Level Cholesterol Diet  Flowsheet Row Cardiac Rehab from 01/14/2023 in [facility] Cardiac and Pulmonary Rehab  Picture Your Plate Total Score on Admission 49      Picture Your Plate Scores: <91 Unhealthy dietary pattern with much room for improvement. 41-50 Dietary pattern unlikely to meet recommendations for good health and room for improvement. 51-60 More healthful dietary pattern, with some room for improvement.  >60 Healthy dietary pattern, although there may be some specific behaviors that could be improved.    Nutrition Goals Re-Evaluation:   Nutrition Goals  Discharge (Final Nutrition Goals Re-Evaluation):   Psychosocial: Target Goals: Acknowledge presence or absence of significant depression and/or stress, maximize coping skills, provide positive support system. Participant is able to verbalize types and ability to use techniques and skills needed for reducing stress and depression.   Education: Stress, Anxiety, and Depression - Group verbal and visual presentation to define topics covered.  Reviews how body is impacted by stress, anxiety, and depression.  Also discusses healthy ways to reduce stress and to treat/manage anxiety and depression.  Written material given at graduation.   Education: Sleep Hygiene -Provides group verbal and written instruction about how sleep can affect your health.  Define sleep hygiene, discuss sleep cycles and impact of sleep habits. Review good sleep hygiene tips.    Initial Review & Psychosocial Screening:  Initial Psych Review & Screening - 12/31/22 1338       Initial Review   Current issues with None Identified      Family Dynamics   Good Support System? Yes   children     Barriers   Psychosocial barriers to  participate in program There are no identifiable barriers or psychosocial needs.      Screening Interventions   Interventions Encouraged to exercise;To provide support and resources with identified psychosocial needs;Provide feedback about the scores to participant    Expected Outcomes Short Term goal: Utilizing psychosocial counselor, staff and physician to assist with identification of specific Stressors or current issues interfering with healing process. Setting desired goal for each stressor or current issue identified.;Long Term Goal: Stressors or current issues are controlled or eliminated.;Short Term goal: Identification and review with participant of any Quality of Life or Depression concerns found by scoring the questionnaire.;Long Term goal: The participant improves quality of Life and PHQ9  Scores as seen by post scores and/or verbalization of changes             Quality of Life Scores:   Quality of Life - 01/14/23 1552       Quality of Life   Select Quality of Life      Quality of Life Scores   Health/Function Pre 9.97 %    Socioeconomic Pre 19.93 %    Psych/Spiritual Pre 15.08 %    Family Pre 16.5 %    GLOBAL Pre 14 %            Scores of 19 and below usually indicate a poorer quality of life in these areas.  A difference of  2-3 points is a clinically meaningful difference.  A difference of 2-3 points in the total score of the Quality of Life Index has been associated with significant improvement in overall quality of life, self-image, physical symptoms, and general health in studies assessing change in quality of life.  PHQ-9: Review Flowsheet       01/14/2023 08/23/2018 01/14/2018 07/12/2015  Depression screen PHQ 2/9  Decreased Interest 2 3 1 1   Down, Depressed, Hopeless 3 3 1  0  PHQ - 2 Score 5 6 2 1   Altered sleeping 2 - - -  Tired, decreased energy 3 - - -  Change in appetite 3 - - -  Feeling bad or failure about yourself  1 - - -  Trouble concentrating 2 - - -  Moving slowly or fidgety/restless 0 - - -  Suicidal thoughts 1 - - -  PHQ-9 Score 17 - - -  Difficult doing work/chores Somewhat difficult - - -    Details           Interpretation of Total Score  Total Score Depression Severity:  1-4 = Minimal depression, 5-9 = Mild depression, 10-14 = Moderate depression, 15-19 = Moderately severe depression, 20-27 = Severe depression   Psychosocial Evaluation and Intervention:  Psychosocial Evaluation - 12/31/22 1351       Psychosocial Evaluation & Interventions   Interventions Encouraged to exercise with the program and follow exercise prescription    Comments Ediie has no bariers to attending the program. He is recently widowed (5 years). HIs children are his support.  His daughter,that works at the hospital) helps him keep his medications  straight.  He wants to get back to a healthier exercie routine and work on losing about 60 lbs. 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  He does not feel that he has any stress or depression.    Expected Outcomes STG Eddie attends all scheduled sessions, works on exercise and nutrition goals. LTG Link Snuffer continues to work on exercise and nutrition goals after discharge. USing tools and resources he learned  about in program    Continue Psychosocial Services  Follow up required by staff             Psychosocial Re-Evaluation:   Psychosocial Discharge (Final Psychosocial Re-Evaluation):   Vocational Rehabilitation: Provide vocational rehab assistance to qualifying candidates.   Vocational Rehab Evaluation & Intervention:  Vocational Rehab - 12/31/22 1343       Initial Vocational Rehab Evaluation & Intervention   Assessment shows need for Vocational Rehabilitation No      Vocational Rehab Re-Evaulation   Comments does work part time on weekend             Education: Education Goals: Education classes will be provided on a variety of topics geared toward better understanding of heart health and risk factor modification. Participant will state understanding/return demonstration of topics presented as noted by education test scores.  Learning Barriers/Preferences:   General Cardiac Education Topics:  AED/CPR: - Group verbal and written instruction with the use of models to demonstrate the basic use of the AED with the basic ABC's of resuscitation.   Anatomy and Cardiac Procedures: - Group verbal and visual presentation and models provide information about basic cardiac anatomy and function. Reviews the testing methods done to diagnose heart disease and the outcomes of the test results. Describes the treatment choices: Medical Management, Angioplasty, or Coronary Bypass Surgery for treating various heart conditions including Myocardial Infarction,  Angina, Valve Disease, and Cardiac Arrhythmias.  Written material given at graduation. Flowsheet Row Cardiac Rehab from 01/14/2023 in [facility] Cardiac and Pulmonary Rehab  Education need identified 01/14/23       Medication Safety: - Group verbal and visual instruction to review commonly prescribed medications for heart and lung disease. Reviews the medication, class of the drug, and side effects. Includes the steps to properly store meds and maintain the prescription regimen.  Written material given at graduation.   Intimacy: - Group verbal instruction through game format to discuss how heart and lung disease can affect sexual intimacy. Written material given at graduation..   Know Your Numbers and Heart Failure: - Group verbal and visual instruction to discuss disease risk factors for cardiac and pulmonary disease and treatment options.  Reviews associated critical values for Overweight/Obesity, Hypertension, Cholesterol, and Diabetes.  Discusses basics of heart failure: signs/symptoms and treatments.  Introduces Heart Failure Zone chart for action plan for heart failure.  Written material given at graduation. Flowsheet Row Cardiac Rehab from 01/14/2023 in [facility] Cardiac and Pulmonary Rehab  Education need identified 01/14/23       Infection Prevention: - Provides verbal and written material to individual with discussion of infection control including proper hand washing and proper equipment cleaning during exercise session. Flowsheet Row Cardiac Rehab from 01/14/2023 in [facility] Cardiac and Pulmonary Rehab  Date 01/14/23  Educator MB  Instruction Review Code 1- Verbalizes Understanding       Falls Prevention: - Provides verbal and written material to individual with discussion of falls prevention and safety. Flowsheet Row Cardiac Rehab from 01/14/2023 in [facility] Cardiac and Pulmonary Rehab  Date 01/14/23  Educator MB  Instruction Review Code 1- Verbalizes Understanding        Other: -Provides group and verbal instruction on various topics (see comments)   Knowledge Questionnaire Score:  Knowledge Questionnaire Score - 01/14/23 1555       Knowledge Questionnaire Score   Pre Score 20/26             Core Components/Risk Factors/Patient Goals at Admission:  Personal  Goals and Risk Factors at Admission - 01/14/23 1555       Core Components/Risk Factors/Patient Goals on Admission    Weight Management Yes;Weight Loss    Intervention Weight Management: Develop a combined nutrition and exercise program designed to reach desired caloric intake, while maintaining appropriate intake of nutrient and fiber, sodium and fats, and appropriate energy expenditure required for the weight goal.;Weight Management: Provide education and appropriate resources to help participant work on and attain dietary goals.;Weight Management/Obesity: Establish reasonable short term and long term weight goals.    Admit Weight 262 lb (118.8 kg)    Goal Weight: Short Term 260 lb (117.9 kg)    Goal Weight: Long Term 200 lb (90.7 kg)    Expected Outcomes Short Term: Continue to assess and modify interventions until short term weight is achieved;Long Term: Adherence to nutrition and physical activity/exercise program aimed toward attainment of established weight goal;Weight Loss: Understanding of general recommendations for a balanced deficit meal plan, which promotes 1-2 lb weight loss per week and includes a negative energy balance of [phone] kcal/d;Understanding recommendations for meals to include 15-35% energy as protein, 25-35% energy from fat, 35-60% energy from carbohydrates, less than 200mg  of dietary cholesterol, 20-35 gm of total fiber daily;Understanding of distribution of calorie intake throughout the day with the consumption of 4-5 meals/snacks    Diabetes Yes    Intervention Provide education about signs/symptoms and action to take for hypo/hyperglycemia.;Provide education about  proper nutrition, including hydration, and aerobic/resistive exercise prescription along with prescribed medications to achieve blood glucose in normal ranges: Fasting glucose 65-99 mg/dL    Expected Outcomes Short Term: Participant verbalizes understanding of the signs/symptoms and immediate care of hyper/hypoglycemia, proper foot care and importance of medication, aerobic/resistive exercise and nutrition plan for blood glucose control.;Long Term: Attainment of HbA1C < 7%.    Hypertension Yes    Intervention Provide education on lifestyle modifcations including regular physical activity/exercise, weight management, moderate sodium restriction and increased consumption of fresh fruit, vegetables, and low fat dairy, alcohol moderation, and smoking cessation.;Monitor prescription use compliance.    Expected Outcomes Short Term: Continued assessment and intervention until BP is < 140/66mm HG in hypertensive participants. < 130/64mm HG in hypertensive participants with diabetes, heart failure or chronic kidney disease.;Long Term: Maintenance of blood pressure at goal levels.    Lipids Yes    Intervention Provide education and support for participant on nutrition & aerobic/resistive exercise along with prescribed medications to achieve LDL 70mg , HDL >40mg .    Expected Outcomes Short Term: Participant states understanding of desired cholesterol values and is compliant with medications prescribed. Participant is following exercise prescription and nutrition guidelines.;Long Term: Cholesterol controlled with medications as prescribed, with individualized exercise RX and with personalized nutrition plan. Value goals: LDL < 70mg , HDL > 40 mg.             Education:Diabetes - Individual verbal and written instruction to review signs/symptoms of diabetes, desired ranges of glucose level fasting, after meals and with exercise. Acknowledge that pre and post exercise glucose checks will be done for 3 sessions at  entry of program. Flowsheet Row Cardiac Rehab from 01/14/2023 in [facility] Cardiac and Pulmonary Rehab  Date 01/14/23  Educator MB  Instruction Review Code 1- Verbalizes Understanding       Core Components/Risk Factors/Patient Goals Review:    Core Components/Risk Factors/Patient Goals at Discharge (Final Review):    ITP Comments:  ITP Comments     Row Name 12/31/22 1356 01/14/23 1541  ITP Comments Virtual orientation call completed today. he has an appointment on Date: 01/14/2023  for EP eval and gym Orientation.  Documentation of diagnosis can be found in [facility] 12/06/2022 . Completed and gym orientation. Initial ITP created and sent for review to Bethann Punches, Medical Director.               Comments: Initial ITP

## 2023-01-14 NOTE — Patient Instructions (Signed)
Patient Instructions  Patient Details  Name: Joshua Lara MRN: [id number] Date of Birth: 1945/10/22 Referring Provider:  [name], MD  Below are your personal goals for exercise, nutrition, and risk factors. Our goal is to help you stay on track towards obtaining and maintaining these goals. We will be discussing your progress on these goals with you throughout the program.  Initial Exercise Prescription:  Initial Exercise Prescription - 01/14/23 1500       Date of Initial Exercise RX and Referring Provider   Date 01/14/23    Referring Provider [name]      Oxygen   Maintain Oxygen Saturation 88% or higher      Recumbant Bike   Level 1    RPM 50    Watts 15    Minutes 15    METs 0.43      NuStep   Level 1    SPM 80    Minutes 15    METs 0.43      T5 Nustep   Level 1    SPM 80    Minutes 15    METs 0.43      Biostep-RELP   Level 1    SPM 50    Minutes 15    METs 0.43      Track   Laps 5    Minutes 15    METs 1.27      Prescription Details   Frequency (times per week) 2    Duration Progress to 30 minutes of continuous aerobic without signs/symptoms of physical distress      Intensity   THRR 40-80% of Max Heartrate 106-131    Ratings of Perceived Exertion 11-13    Perceived Dyspnea 0-4      Progression   Progression Continue to progress workloads to maintain intensity without signs/symptoms of physical distress.      Resistance Training   Training Prescription Yes    Weight 3 lb    Reps 10-15             Exercise Goals: Frequency: Be able to perform aerobic exercise two to three times per week in program working toward 2-5 days per week of home exercise.  Intensity: Work with a perceived exertion of 11 (fairly light) - 15 (hard) while following your exercise prescription.  We will make changes to your prescription with you as you progress through the program.   Duration: Be able to do 30 to 45 minutes of continuous aerobic exercise in  addition to a 5 minute warm-up and a 5 minute cool-down routine.   Nutrition Goals: Your personal nutrition goals will be established when you do your nutrition analysis with the dietician.  The following are general nutrition guidelines to follow: Cholesterol < 200mg /day Sodium < 1500mg /day Fiber: Men over 50 yrs - 30 grams per day  Personal Goals:  Personal Goals and Risk Factors at Admission - 01/14/23 1555       Core Components/Risk Factors/Patient Goals on Admission    Weight Management Yes;Weight Loss    Intervention Weight Management: Develop a combined nutrition and exercise program designed to reach desired caloric intake, while maintaining appropriate intake of nutrient and fiber, sodium and fats, and appropriate energy expenditure required for the weight goal.;Weight Management: Provide education and appropriate resources to help participant work on and attain dietary goals.;Weight Management/Obesity: Establish reasonable short term and long term weight goals.    Admit Weight 262 lb (118.8 kg)    Goal Weight:  Short Term 260 lb (117.9 kg)    Goal Weight: Long Term 200 lb (90.7 kg)    Expected Outcomes Short Term: Continue to assess and modify interventions until short term weight is achieved;Long Term: Adherence to nutrition and physical activity/exercise program aimed toward attainment of established weight goal;Weight Loss: Understanding of general recommendations for a balanced deficit meal plan, which promotes 1-2 lb weight loss per week and includes a negative energy balance of [phone] kcal/d;Understanding recommendations for meals to include 15-35% energy as protein, 25-35% energy from fat, 35-60% energy from carbohydrates, less than 200mg  of dietary cholesterol, 20-35 gm of total fiber daily;Understanding of distribution of calorie intake throughout the day with the consumption of 4-5 meals/snacks    Diabetes Yes    Intervention Provide education about signs/symptoms and  action to take for hypo/hyperglycemia.;Provide education about proper nutrition, including hydration, and aerobic/resistive exercise prescription along with prescribed medications to achieve blood glucose in normal ranges: Fasting glucose 65-99 mg/dL    Expected Outcomes Short Term: Participant verbalizes understanding of the signs/symptoms and immediate care of hyper/hypoglycemia, proper foot care and importance of medication, aerobic/resistive exercise and nutrition plan for blood glucose control.;Long Term: Attainment of HbA1C < 7%.    Hypertension Yes    Intervention Provide education on lifestyle modifcations including regular physical activity/exercise, weight management, moderate sodium restriction and increased consumption of fresh fruit, vegetables, and low fat dairy, alcohol moderation, and smoking cessation.;Monitor prescription use compliance.    Expected Outcomes Short Term: Continued assessment and intervention until BP is < 140/37mm HG in hypertensive participants. < 130/54mm HG in hypertensive participants with diabetes, heart failure or chronic kidney disease.;Long Term: Maintenance of blood pressure at goal levels.    Lipids Yes    Intervention Provide education and support for participant on nutrition & aerobic/resistive exercise along with prescribed medications to achieve LDL 70mg , HDL >40mg .    Expected Outcomes Short Term: Participant states understanding of desired cholesterol values and is compliant with medications prescribed. Participant is following exercise prescription and nutrition guidelines.;Long Term: Cholesterol controlled with medications as prescribed, with individualized exercise RX and with personalized nutrition plan. Value goals: LDL < 70mg , HDL > 40 mg.             Tobacco Use Initial Evaluation: Social History   Tobacco Use  Smoking Status Former   Current packs/day: 0.00   Average packs/day: 2.0 packs/day for 30.0 years (60.0 ttl pk-yrs)   Types:  Cigarettes   Start date: 05/12/1960   Quit date: 05/12/1990   Years since quitting: 32.6  Smokeless Tobacco Never    Exercise Goals and Review:  Exercise Goals     Row Name 01/14/23 1550             Exercise Goals   Increase Physical Activity Yes       Intervention Provide advice, education, support and counseling about physical activity/exercise needs.;Develop an individualized exercise prescription for aerobic and resistive training based on initial evaluation findings, risk stratification, comorbidities and participant's personal goals.       Expected Outcomes Short Term: Attend rehab on a regular basis to increase amount of physical activity.;Long Term: Exercising regularly at least 3-5 days a week.;Long Term: Add in home exercise to make exercise part of routine and to increase amount of physical activity.       Increase Strength and Stamina Yes       Intervention Provide advice, education, support and counseling about physical activity/exercise needs.;Develop an individualized exercise prescription for  aerobic and resistive training based on initial evaluation findings, risk stratification, comorbidities and participant's personal goals.       Expected Outcomes Short Term: Increase workloads from initial exercise prescription for resistance, speed, and METs.;Long Term: Improve cardiorespiratory fitness, muscular endurance and strength as measured by increased METs and functional capacity ( );Short Term: Perform resistance training exercises routinely during rehab and add in resistance training at home       Able to understand and use rate of perceived exertion (RPE) scale Yes       Intervention Provide education and explanation on how to use RPE scale       Expected Outcomes Short Term: Able to use RPE daily in rehab to express subjective intensity level;Long Term:  Able to use RPE to guide intensity level when exercising independently       Able to understand and use Dyspnea scale Yes        Intervention Provide education and explanation on how to use Dyspnea scale       Expected Outcomes Short Term: Able to use Dyspnea scale daily in rehab to express subjective sense of shortness of breath during exertion;Long Term: Able to use Dyspnea scale to guide intensity level when exercising independently       Knowledge and understanding of Target Heart Rate Range (THRR) Yes       Intervention Provide education and explanation of THRR including how the numbers were predicted and where they are located for reference       Expected Outcomes Short Term: Able to state/look up THRR;Long Term: Able to use THRR to govern intensity when exercising independently;Short Term: Able to use daily as guideline for intensity in rehab       Able to check pulse independently Yes       Intervention Provide education and demonstration on how to check pulse in carotid and radial arteries.;Review the importance of being able to check your own pulse for safety during independent exercise       Expected Outcomes Short Term: Able to explain why pulse checking is important during independent exercise;Long Term: Able to check pulse independently and accurately       Understanding of Exercise Prescription Yes       Intervention Provide education, explanation, and written materials on patient's individual exercise prescription       Expected Outcomes Short Term: Able to explain program exercise prescription;Long Term: Able to explain home exercise prescription to exercise independently                Copy of goals given to participant.

## 2023-01-16 DIAGNOSIS — E039 Hypothyroidism, unspecified: Secondary | ICD-10-CM | POA: Diagnosis not present

## 2023-01-16 DIAGNOSIS — I2510 Atherosclerotic heart disease of native coronary artery without angina pectoris: Secondary | ICD-10-CM | POA: Diagnosis not present

## 2023-01-16 DIAGNOSIS — M069 Rheumatoid arthritis, unspecified: Secondary | ICD-10-CM | POA: Diagnosis not present

## 2023-01-16 DIAGNOSIS — I10 Essential (primary) hypertension: Secondary | ICD-10-CM | POA: Diagnosis not present

## 2023-01-16 DIAGNOSIS — N400 Enlarged prostate without lower urinary tract symptoms: Secondary | ICD-10-CM | POA: Diagnosis not present

## 2023-01-16 DIAGNOSIS — J9611 Chronic respiratory failure with hypoxia: Secondary | ICD-10-CM | POA: Diagnosis not present

## 2023-01-16 DIAGNOSIS — G4733 Obstructive sleep apnea (adult) (pediatric): Secondary | ICD-10-CM | POA: Diagnosis not present

## 2023-01-16 DIAGNOSIS — E1169 Type 2 diabetes mellitus with other specified complication: Secondary | ICD-10-CM | POA: Diagnosis not present

## 2023-01-16 DIAGNOSIS — I5032 Chronic diastolic (congestive) heart failure: Secondary | ICD-10-CM | POA: Diagnosis not present

## 2023-01-16 DIAGNOSIS — I480 Paroxysmal atrial fibrillation: Secondary | ICD-10-CM | POA: Diagnosis not present

## 2023-01-19 ENCOUNTER — Encounter: Payer: Self-pay | Admitting: Cardiology

## 2023-01-19 ENCOUNTER — Ambulatory Visit: Payer: PPO | Admitting: Cardiology

## 2023-01-19 VITALS — BP 111/64 | HR 97 | Resp 14 | Ht 69.0 in | Wt 272.0 lb

## 2023-01-19 DIAGNOSIS — Z789 Other specified health status: Secondary | ICD-10-CM

## 2023-01-19 DIAGNOSIS — E7800 Pure hypercholesterolemia, unspecified: Secondary | ICD-10-CM

## 2023-01-19 DIAGNOSIS — I5032 Chronic diastolic (congestive) heart failure: Secondary | ICD-10-CM

## 2023-01-19 DIAGNOSIS — R0609 Other forms of dyspnea: Secondary | ICD-10-CM

## 2023-01-19 DIAGNOSIS — Z955 Presence of coronary angioplasty implant and graft: Secondary | ICD-10-CM | POA: Diagnosis not present

## 2023-01-19 DIAGNOSIS — I480 Paroxysmal atrial fibrillation: Secondary | ICD-10-CM

## 2023-01-19 DIAGNOSIS — I5020 Unspecified systolic (congestive) heart failure: Secondary | ICD-10-CM

## 2023-01-19 DIAGNOSIS — E781 Pure hyperglyceridemia: Secondary | ICD-10-CM | POA: Diagnosis not present

## 2023-01-19 DIAGNOSIS — Z7901 Long term (current) use of anticoagulants: Secondary | ICD-10-CM | POA: Diagnosis not present

## 2023-01-19 DIAGNOSIS — G4733 Obstructive sleep apnea (adult) (pediatric): Secondary | ICD-10-CM

## 2023-01-19 DIAGNOSIS — I2510 Atherosclerotic heart disease of native coronary artery without angina pectoris: Secondary | ICD-10-CM | POA: Diagnosis not present

## 2023-01-19 DIAGNOSIS — Z794 Long term (current) use of insulin: Secondary | ICD-10-CM

## 2023-01-19 DIAGNOSIS — Z79899 Other long term (current) drug therapy: Secondary | ICD-10-CM

## 2023-01-19 DIAGNOSIS — I252 Old myocardial infarction: Secondary | ICD-10-CM | POA: Diagnosis not present

## 2023-01-19 DIAGNOSIS — I7121 Aneurysm of the ascending aorta, without rupture: Secondary | ICD-10-CM

## 2023-01-19 DIAGNOSIS — E119 Type 2 diabetes mellitus without complications: Secondary | ICD-10-CM

## 2023-01-19 MED ORDER — OZEMPIC (0.25 OR 0.5 MG/DOSE) 2 MG/3ML ~~LOC~~ SOPN
0.5000 mg | PEN_INJECTOR | SUBCUTANEOUS | 0 refills | Status: DC
Start: 2023-01-31 — End: 2023-01-28

## 2023-01-19 NOTE — Progress Notes (Signed)
Joshua Lara Date of Birth: 03-05-1946 MRN: [id number] Primary Care Provider:[name], Aldean Jewett Former Cardiology Providers: Alt[place], APRN, FNP-C Primary Cardiologist: Tessa Lerner, DO, [facility] (established care 10/28/2019)  Date: 01/19/23 Last Office Visit: 12/16/2022.  Chief Complaint  Patient presents with   Follow-up    CAD and HF    HPI  Joshua Lara is a 77 y.o.  male whose past medical history and cardiovascular risk factors include: Acute inferolateral STEMI status post thrombectomy and stenting (December 07, 2022), Paroxysmal atrial fibrillation (status post cardioversion July 2024), heart failure with improved EF, chronic HFpEF, ascending aortic aneurysm 42 mm (CT Chest 07/04/2022), hypertension, hyperlipidemia, OSA on BiPAP, diabetes mellitus, glaucoma, obesity due to excess calories, advanced age.  Patient is being followed by the practice for HFpEF, A-fib, CAD status post STEMI July 2024, hyperlipidemia with statin intolerance.  In July 2024 patient presented to [facility] with a STEMI involving inferolateral leads and was taken to Cath Lab emergently underwent aspiration thrombectomy followed by direct stenting.  At the last office visit he was started on Ozempic and was asked to stop Trulicity.  And uptitration of GDMT has been difficult due to soft blood pressures requiring midodrine.  Patient presents today for follow-up.  Patient denies anginal chest pain.  Shortness of breath is significantly improving compared to the past.  But has been experiencing more aches and pains likely secondary to holding methotrexate.   ALLERGIES: Allergies  Allergen Reactions   Bactrim [Sulfamethoxazole-Trimethoprim] Swelling   Ranitidine Hcl Anaphylaxis and Other (See Comments)    Has anaphylactic shock--intubated and in ICU x 4 days   Adalimumab Other (See Comments)   Etanercept Other (See Comments) and Cough    (ENBREL); Patient thinks it contributed to heart issues    Etanercept     Other reaction(s): Cough (ALLERGY/intolerance), Other (See Comments) (ENBREL); Patient thinks it contributed to heart issues   Other Other (See Comments)    Tested allergic   Sulfa Antibiotics Other (See Comments)   Tape Itching    Can only tolerate bandages for less than 24-hr periods    Walnut Other (See Comments)    Tested allergic- Black walnuts    MEDICATION LIST PRIOR TO VISIT: Current Outpatient Medications on File Prior to Visit  Medication Sig Dispense Refill   acetaminophen (TYLENOL) 650 MG CR tablet Take 650 mg by mouth every 8 (eight) hours as needed for pain.     albuterol (VENTOLIN HFA) 108 (90 Base) MCG/ACT inhaler Inhale 1-2 puffs into the lungs every 4 (four) hours as needed for wheezing or shortness of breath.     apixaban (ELIQUIS) 5 MG TABS tablet TAKE 1 TABLET BY MOUTH EVERY 12 HOURS (Patient taking differently: Take 5 mg by mouth every 12 (twelve) hours.) 60 tablet 10   Armodafinil 150 MG tablet Take 150 mg by mouth daily.     BD PEN NEEDLE NANO 2ND GEN 32G X 4 MM MISC      clobetasol cream (TEMOVATE) 0.05 % Apply 1 Application topically as needed (breakouts on left arm).     clopidogrel (PLAVIX) 75 MG tablet Take 1 tablet (75 mg total) by mouth daily. 90 tablet 1   Continuous Glucose Sensor (FREESTYLE LIBRE 2 SENSOR) MISC Inject 1 Device into the skin every 14 (fourteen) days.     cyanocobalamin (VITAMIN B12) 500 MCG tablet Take 500 mcg by mouth daily.     Evolocumab (REPATHA SURECLICK) 140 MG/ML SOAJ Inject 140 mg into the skin  every 14 (fourteen) days. 6 mL 1   fenofibrate (TRICOR) 145 MG tablet TAKE 1 TABLET BY MOUTH EVERY DAY (Patient taking differently: Take 145 mg by mouth daily.) 90 tablet 1   fluticasone (FLONASE) 50 MCG/ACT nasal spray Place 1 spray into both nostrils 2 (two) times daily.     folic acid (FOLVITE) 1 MG tablet Take 1 mg by mouth daily.     HUMULIN R U-500 KWIKPEN 500 UNIT/ML kwikpen Inject 125-150 Units into the skin 2  (two) times daily.      latanoprost (XALATAN) 0.005 % ophthalmic solution Place 1 drop into both eyes in the morning and at bedtime.     levothyroxine (SYNTHROID, LEVOTHROID) 100 MCG tablet Take 100 mcg by mouth daily.     Multiple Vitamins-Minerals (CENTRUM SILVER PO) Take 1 tablet by mouth daily.     nitroGLYCERIN (NITROSTAT) 0.4 MG SL tablet Place 1 tablet (0.4 mg total) under the tongue every 5 (five) minutes as needed for chest pain. If you require more than two tablets five minutes apart go to the nearest ER via EMS. 30 tablet 0   pantoprazole (PROTONIX) 40 MG tablet Take 40 mg by mouth daily.     pravastatin (PRAVACHOL) 40 MG tablet TAKE 1 TABLET BY MOUTH DAILY AT 6 PM. 90 tablet 3   sotalol (BETAPACE) 80 MG tablet Take 0.5 tablets (40 mg total) by mouth 2 (two) times daily.     tamsulosin (FLOMAX) 0.4 MG CAPS capsule Take 0.4 mg by mouth daily after supper.     Vitamin D, Ergocalciferol, (DRISDOL) 1.25 MG (50000 UNIT) CAPS capsule Take 50,000 Units by mouth once a week.     methotrexate 2.5 MG tablet Take 2.5 mg by mouth once a week. (Patient not taking: Reported on 01/19/2023)     No current facility-administered medications on file prior to visit.    PAST MEDICAL HISTORY: Past Medical History:  Diagnosis Date   Arthritis    "hands, back, ankles" (05/11/2014)   CHF (congestive heart failure) (HCC)    Chronic lower back pain    Compression fracture of lumbar vertebra (HCC)    Coronary artery disease    Daily headache    "recently" (05/11/2014)   Depression    "I might be slight" (05/11/2014)   GERD (gastroesophageal reflux disease)    High cholesterol    Hypertension    Hypothyroid    OSA treated with BiPAP    Pneumonia    "couple times; last time was 03/2011" (05/11/2014)   Type II diabetes mellitus (HCC)     PAST SURGICAL HISTORY: Past Surgical History:  Procedure Laterality Date   APPENDECTOMY     CARDIAC CATHETERIZATION  2001; 2009   CARDIOVERSION N/A 12/06/2022    Procedure: CARDIOVERSION;  Surgeon: [name], MD;  Location: [facility] INVASIVE CV LAB;  Service: Cardiovascular;  Laterality: N/A;   CATARACT EXTRACTION W/ INTRAOCULAR LENS IMPLANT Left    CHOLECYSTECTOMY  1980's   Hattie Perch 07/13/2010    CORONARY/GRAFT ACUTE MI REVASCULARIZATION N/A 12/06/2022   Procedure: Coronary/Graft Acute MI Revascularization;  Surgeon: [name], MD;  Location[facility]VASIVE CV LAB;  Service: Cardiovascular;  Laterality: N/A;   EYE MUSCLE SURGERY Left    LEFT AND RIGHT HEART CATHETERIZATION WITH CORONARY ANGIOGRAM N/A 05/30/2014   Procedure: LEFT AND RIGHT HEART CATHETERIZATION WITH CORONARY ANGIOGRAM;  Surgeon: Dolores Patty, MD;  Location: [facility] CATH LAB;  Service: Cardiovascular;  Laterality: N/A;   LEFT HEART CATH AND CORONARY ANGIOGRAPHY N/A 12/20/2018  Procedure: LEFT HEART CATH AND CORONARY ANGIOGRAPHY and possible intervention;  Surgeon: [name], MD;  Location: [facility] INVASIVE CV LAB;  Service: Cardiovascular;  Laterality: N/A;   LEFT HEART CATH AND CORONARY ANGIOGRAPHY N/A 12/06/2022   Procedure: LEFT HEART CATH AND CORONARY ANGIOGRAPHY;  Surgeon: [name], MD;  Location: [facility] INVASIVE CV LAB;  Service: Cardiovascular;  Laterality: N/A;   MASTOIDECTOMY Right 1970's   /notes 07/13/2010   SHOULDER OPEN ROTATOR CUFF REPAIR Left 07/2010   SPHINCTEROTOMY     TEMPORARY PACEMAKER N/A 12/06/2022   Procedure: TEMPORARY PACEMAKER;  Surgeon: [name], MD;  Location: [facility] INVASIVE CV LAB;  Service: Cardiovascular;  Laterality: N/A;   TONSILLECTOMY AND ADENOIDECTOMY      FAMILY HISTORY: The patient's family history includes COPD in his sister; Cirrhosis in his brother; Esophageal cancer in his brother; Heart attack in his father; Heart attack (age of onset: 63) in his maternal grandmother; Kidney failure in his mother; Osteoporosis in his sister; Other in his sister; Pancreatic cancer in his brother; Stroke in his maternal grandfather.   SOCIAL HISTORY:  The patient  reports that he  quit smoking about 32 years ago. His smoking use included cigarettes. He started smoking about 62 years ago. He has a 60 pack-year smoking history. He has never used smokeless tobacco. He reports that he does not currently use alcohol. He reports that he does not use drugs.  Review of Systems  Constitutional: Positive for malaise/fatigue. Negative for chills and fever.  HENT:  Negative for hoarse voice and nosebleeds.   Eyes:  Negative for discharge, double vision and pain.  Cardiovascular:  Positive for dyspnea on exertion (chronic but better). Negative for chest pain, claudication, leg swelling, near-syncope, orthopnea, palpitations, paroxysmal nocturnal dyspnea and syncope.  Respiratory:  Positive for shortness of breath (improved). Negative for hemoptysis.   Musculoskeletal:  Positive for joint pain. Negative for muscle cramps and myalgias.  Gastrointestinal:  Negative for abdominal pain, constipation, diarrhea, hematemesis, hematochezia, melena, nausea and vomiting.  Neurological:  Negative for dizziness, light-headedness and vertigo.    PHYSICAL EXAM:    01/19/2023    1:34 PM 01/14/2023    3:51 PM 12/16/2022   12:33 PM  Vitals with BMI  Height 5\' 9"  5' 9.2" 5\' 10"   Weight 272 lbs 270 lbs 8 oz 274 lbs  BMI 40.15 39.7 39.32  Systolic 111  130  Diastolic 64  76  Pulse 97  90    Physical Exam Cardiovascular:     Rate and Rhythm: Normal rate and regular rhythm.     Pulses:          Carotid pulses are 2+ on the right side and 2+ on the left side.      Radial pulses are 2+ on the right side and 2+ on the left side.       Dorsalis pedis pulses are 2+ on the right side and 2+ on the left side.       Posterior tibial pulses are 1+ on the right side and 1+ on the left side.     Heart sounds: Normal heart sounds. No murmur heard.    No gallop.  Pulmonary:     Effort: Pulmonary effort is normal.     Breath sounds: Normal breath sounds. No wheezing or rales.  Musculoskeletal:     Right  lower leg: No edema.     Left lower leg: No edema.  Neurological:     Mental Status: He is alert.  CARDIAC DATABASE: EKG:  December 16, 2022: Sinus rhythm, 94 bpm, first-degree AV block, right axis, without underlying injury pattern.  Echocardiogram: 01/14/2019: LVEF 45-50%, calculated LVEF 40%, grade 1 diastolic impairment, mild LAE, see report for details.   12/09/2019: EF 45-50%. Indeterminate diastolic filling pattern, elevated LAP. See report for details.   04/11/2022: LVEF 60 to 65%, mild LVH, no significant valvular heart disease.  See report for additional details.  04/26/024:   1. Left ventricular ejection fraction, by estimation, is 60 to 65%. The left ventricle has normal function. The left ventricle has no regional wall motion abnormalities. There is mild left ventricular hypertrophy. Left ventricular diastolic parameters are consistent with Grade I diastolic dysfunction (impaired relaxation).   2. Right ventricular systolic function is normal. The right ventricular size is normal.   3. The mitral valve is normal in structure. No evidence of mitral valve regurgitation. No evidence of mitral stenosis.   4. The aortic valve is tricuspid. There is mild calcification of the aortic valve. There is mild thickening of the aortic valve. Aortic valve regurgitation is not visualized. Aortic valve sclerosis is present, with no evidence of aortic valve stenosis.   5. Aortic dilatation noted. There is moderate dilatation of the ascending aorta, measuring 42 mm.   6. The inferior vena cava is normal in size with greater than 50% respiratory variability, suggesting right atrial pressure of 3 mmHg.    11/19/2022:   1. Left ventricular ejection fraction, by estimation, is 55 to 60%. The left ventricle has normal function. The left ventricle has no regional wall motion abnormalities. There is mild left ventricular hypertrophy. Left ventricular diastolic parameters  are consistent with Grade III  diastolic dysfunction (restrictive). Elevated left ventricular end-diastolic pressure.  2. Right ventricular systolic function is normal. The right ventricular size is normal.  3. The mitral valve is normal in structure. No evidence of mitral valve regurgitation. No evidence of mitral stenosis.  4. The aortic valve is grossly normal. Aortic valve regurgitation is not visualized. Aortic valve sclerosis is present, with no evidence of aortic valve stenosis.  5. Aortic dilatation noted. There is dilatation of the ascending aorta, measuring 39 mm. There is dilatation of the aortic root, measuring 43 mm.  6. The inferior vena cava is normal in size with greater than 50% respiratory variability, suggesting right atrial pressure of 3 mmHg.  Stress Testing:  Lexiscan/modified Bruce Tetrofosmin stress test 11/23/2019: Lexiscan/modified Bruce nuclear stress test performed using 1-day protocol. Stress EKG is non-diagnostic, as this is pharmacological stress test. In addition, stress EKG at 71% MPHR showed sinus rhythm, possible old anteroseptal infarct, poor R wave progression, low voltage, no ischemic changes.   Large areas of soft tissue attenuation seen, particularly in vertical long axis images. In addition, Apical thinning seen on both rest and stress images may be physiological. Overall, no reversible ischemia noted. Stress LVEF 49%, although visually appears normal.  Low risk study.   Heart Catheterization: Left Heart Catheterization 12/07/22:  LV: 109/19, EDP 27 mmHg.  Ao: 1 6/61, mean 76 mmHg.  No pressure gradient across the aortic valve on 10 mcg/kg/min of dopamine. LM: Large-caliber vessel.  Smooth and normal. LAD: Large-caliber vessel, gives origin to large D1.  Smooth and normal. Ramus intermediate: Moderate caliber vessel.  Smooth and normal. CX: Moderate caliber vessel.  Smooth and normal. RCA: Large-caliber vessel, gives origin to a large PDA and 2 small PL branches originating close by to  each other, there is an ulcerated 100% occlusion  of the distal right coronary artery.    Intervention data: Successful thrombectomy with Pronto aspiration followed by direct stenting with 3.5 x 24 mm Synergy XD DES at 13 atmospheric pressure, stenosis reduced from 100% to 0% TIMI-0 flow improved to TIMI-3 flow.   LABORATORY DATA:    Latest Ref Rng & Units 12/07/2022    4:50 AM 12/07/2022   12:24 AM 12/06/2022   11:34 PM  CBC  WBC 4.0 - 10.5 K/uL 8.3   8.5   Hemoglobin 13.0 - 17.0 g/dL 14.7  82.9  56.2   Hematocrit 39.0 - 52.0 % 41.3  42.0  42.2   Platelets 150 - 400 K/uL 216   251    External Labs: Collected: 06/07/2021 provided by PCP. BUN 11, creatinine 0.9. eGFR 90. Sodium 141, potassium 4, chloride 103, bicarb 27 AST 23, ALT 27, alkaline phosphatase 113 Hemoglobin 16.9 g/dL, hematocrit 13.0% BNP 8.2 Total cholesterol 173, triglycerides 259, HDL 42, LDL 88  External Labs: Collected: 08/13/2022 provided by PCP BUN 16, creatinine 1.08. BUN to creatinine ratio 15. Sodium 135, potassium 4.4, chloride 99, bicarb 23. AST 26, ALT 24, alkaline phosphatase 64 Hemoglobin 15.1, hematocrit 43.9% BNP 4.4  Lab Results  Component Value Date   CHOL 93 12/08/2022   HDL 39 (L) 12/08/2022   LDLCALC 30 12/08/2022   LDLDIRECT 35 12/08/2022   TRIG 118 12/08/2022   CHOLHDL 2.4 12/08/2022     IMPRESSION:    ICD-10-CM   1. Atherosclerosis of native coronary artery of native heart without angina pectoris  I25.10 OZEMPIC, 0.25 OR 0.5 MG/DOSE, 2 MG/3ML SOPN    2. History of ST elevation myocardial infarction (STEMI)  I25.2 OZEMPIC, 0.25 OR 0.5 MG/DOSE, 2 MG/3ML SOPN    3. Coronary stent patent  Z95.5 OZEMPIC, 0.25 OR 0.5 MG/DOSE, 2 MG/3ML SOPN    4. Heart failure with improved ejection fraction (HFimpEF) (HCC)  I50.32     5. Dyspnea on exertion  R06.09     6. Aneurysm of ascending aorta without rupture (HCC)  I71.21     7. Paroxysmal atrial fibrillation (HCC)  I48.0     8. Long term  (current) use of anticoagulants  Z79.01     9. Long term current use of antiarrhythmic drug  Z79.899     10. Type 2 diabetes mellitus without complication, with long-term current use of insulin (HCC)  E11.9    Z79.4     11. Pure hypercholesterolemia  E78.00     12. Hypertriglyceridemia  E78.1     13. Statin intolerance  Z78.9     14. OSA treated with BiPAP  G47.33     15. Chronic heart failure with preserved ejection fraction (HFpEF) (HCC)  I50.32 losartan (COZAAR) 25 MG tablet          RECOMMENDATIONS: Joshua Lara is a 77 y.o. male whose past medical history and cardiovascular risk factors include: Acute inferolateral STEMI status post thrombectomy and stenting (December 07, 2022), Paroxysmal atrial fibrillation (status post cardioversion July 2024), heart failure with improved EF, chronic HFpEF, ascending aortic aneurysm 42 mm (CT Chest 07/04/2022), hypertension, hyperlipidemia, OSA on BiPAP, diabetes mellitus, glaucoma, obesity due to excess calories, advanced age.  Atherosclerosis of native coronary artery of native heart without angina pectoris History of ST elevation myocardial infarction (STEMI) Coronary stent patent Index event 12/07/2022. Status post thrombectomy followed by direct stenting with 3.5 x 24 mm Synergy DES. Denies anginal chest pain since last office visit and dyspnea has improved Currently  on Plavix and Eliquis until June 08, 2022 after which we will transition him back to Eliquis and aspirin 81 mg p.o. daily. No use of sublingual nitroglycerin tablets since the last office visit. Has tolerated Ozempic well.  Prescription refilled. Monitor for now  Heart failure with improved ejection fraction (HFimpEF) (HCC) Chronic heart failure with preserved ejection fraction (HFpEF) (HCC) July 2021 LVEF 45-50% see report for additional details. July 2024: LVEF 55 to 60%, grade 3 diastolic dysfunction, see report for details Uptitration of GDMT has been difficult  due to soft blood pressures. Currently being evaluated for adrenal insufficiency and his cortisol levels were low during his last hospitalization. Will restart low-dose losartan 25 mg p.o. daily.  With holding parameters Patient is asked to keep a log of his blood pressures. Jardiance in the past discontinued due to yeast infection.  Dyspnea on exertion Multifactorial. Since last office visit patient has held off on methotrexate and his shortness of breath is significantly improved as well as the coughing. I have asked him to follow-up with pulmonary medicine for further evaluation.  Aneurysm of ascending aorta without rupture [facility] CT chest at [facility] February 2024-per report ascending aorta 42 mm. Echocardiogram July 2024-ascending aorta 43 mm. Blood pressures are well-controlled. Annual follow-up recommended by radiology-CT of the chest scheduled for March 2025. Avoid medications such as ciprofloxacin and heavy lifting.  Paroxysmal atrial fibrillation (HCC) Long term (current) use of anticoagulants Long term current use of antiarrhythmic drug Rate control: Sotalol. Rhythm control: Sotalol. Thromboembolic prophylaxis: Eliquis. During the hospitalization sotalol dose was reduced to 40 mg p.o. twice daily due to soft blood pressures. EKG today illustrates sinus rhythm. Does not endorse evidence of bleeding. CHA2DS2-VASc SCORE is 6 which correlates to 9.7% risk of stroke per year (DM, aortic atherosclerosis, HTN, CHF, age).   Type 2 diabetes mellitus without complication, with long-term current use of insulin (HCC) With complications of glaucoma, CHF, CAD Reemphasized importance of secondary prevention. Up titration of Ozempic as discussed above  Pure hypercholesterolemia Hypertriglyceridemia Statin intolerance Currently on maximally tolerated dose of statin Continue Repatha. Direct LDL is at goal as of July 0981.  OSA treated with BiPAP Reemphasized importance of  device compliance. He was supposed to be started on nocturnal oxygen, per patient.  FINAL MEDICATION LIST END OF ENCOUNTER: Meds ordered this encounter  Medications   OZEMPIC, 0.25 OR 0.5 MG/DOSE, 2 MG/3ML SOPN    Sig: Inject 0.5 mg into the skin once a week for 4 doses.    Dispense:  3 mL    Refill:  0   losartan (COZAAR) 25 MG tablet    Sig: Take 1 tablet (25 mg total) by mouth daily. Hold if systolic blood pressure (top number) less than 100 mmHg or pulse less than 60 bpm.     Current Outpatient Medications:    acetaminophen (TYLENOL) 650 MG CR tablet, Take 650 mg by mouth every 8 (eight) hours as needed for pain., Disp: , Rfl:    albuterol (VENTOLIN HFA) 108 (90 Base) MCG/ACT inhaler, Inhale 1-2 puffs into the lungs every 4 (four) hours as needed for wheezing or shortness of breath., Disp: , Rfl:    apixaban (ELIQUIS) 5 MG TABS tablet, TAKE 1 TABLET BY MOUTH EVERY 12 HOURS (Patient taking differently: Take 5 mg by mouth every 12 (twelve) hours.), Disp: 60 tablet, Rfl: 10   Armodafinil 150 MG tablet, Take 150 mg by mouth daily., Disp: , Rfl:    BD PEN NEEDLE NANO 2ND GEN  32G X 4 MM MISC, , Disp: , Rfl:    clobetasol cream (TEMOVATE) 0.05 %, Apply 1 Application topically as needed (breakouts on left arm)., Disp: , Rfl:    clopidogrel (PLAVIX) 75 MG tablet, Take 1 tablet (75 mg total) by mouth daily., Disp: 90 tablet, Rfl: 1   Continuous Glucose Sensor (FREESTYLE LIBRE 2 SENSOR) MISC, Inject 1 Device into the skin every 14 (fourteen) days., Disp: , Rfl:    cyanocobalamin (VITAMIN B12) 500 MCG tablet, Take 500 mcg by mouth daily., Disp: , Rfl:    Evolocumab (REPATHA SURECLICK) 140 MG/ML SOAJ, Inject 140 mg into the skin every 14 (fourteen) days., Disp: 6 mL, Rfl: 1   fenofibrate (TRICOR) 145 MG tablet, TAKE 1 TABLET BY MOUTH EVERY DAY (Patient taking differently: Take 145 mg by mouth daily.), Disp: 90 tablet, Rfl: 1   fluticasone (FLONASE) 50 MCG/ACT nasal spray, Place 1 spray into both  nostrils 2 (two) times daily., Disp: , Rfl:    folic acid (FOLVITE) 1 MG tablet, Take 1 mg by mouth daily., Disp: , Rfl:    HUMULIN R U-500 KWIKPEN 500 UNIT/ML kwikpen, Inject 125-150 Units into the skin 2 (two) times daily. , Disp: , Rfl:    latanoprost (XALATAN) 0.005 % ophthalmic solution, Place 1 drop into both eyes in the morning and at bedtime., Disp: , Rfl:    levothyroxine (SYNTHROID, LEVOTHROID) 100 MCG tablet, Take 100 mcg by mouth daily., Disp: , Rfl:    Multiple Vitamins-Minerals (CENTRUM SILVER PO), Take 1 tablet by mouth daily., Disp: , Rfl:    nitroGLYCERIN (NITROSTAT) 0.4 MG SL tablet, Place 1 tablet (0.4 mg total) under the tongue every 5 (five) minutes as needed for chest pain. If you require more than two tablets five minutes apart go to the nearest ER via EMS., Disp: 30 tablet, Rfl: 0   pantoprazole (PROTONIX) 40 MG tablet, Take 40 mg by mouth daily., Disp: , Rfl:    pravastatin (PRAVACHOL) 40 MG tablet, TAKE 1 TABLET BY MOUTH DAILY AT 6 PM., Disp: 90 tablet, Rfl: 3   sotalol (BETAPACE) 80 MG tablet, Take 0.5 tablets (40 mg total) by mouth 2 (two) times daily., Disp: , Rfl:    tamsulosin (FLOMAX) 0.4 MG CAPS capsule, Take 0.4 mg by mouth daily after supper., Disp: , Rfl:    Vitamin D, Ergocalciferol, (DRISDOL) 1.25 MG (50000 UNIT) CAPS capsule, Take 50,000 Units by mouth once a week., Disp: , Rfl:    losartan (COZAAR) 25 MG tablet, Take 1 tablet (25 mg total) by mouth daily. Hold if systolic blood pressure (top number) less than 100 mmHg or pulse less than 60 bpm., Disp: , Rfl:    methotrexate 2.5 MG tablet, Take 2.5 mg by mouth once a week. (Patient not taking: Reported on 01/19/2023), Disp: , Rfl:    [START ON 01/31/2023] OZEMPIC, 0.25 OR 0.5 MG/DOSE, 2 MG/3ML SOPN, Inject 0.5 mg into the skin once a week for 4 doses., Disp: 3 mL, Rfl: 0  No orders of the defined types were placed in this encounter.  --Continue cardiac medications as reconciled in final medication list. --Return  in about 21 weeks (around 06/15/2023) for Follow up, CAD. Or sooner if needed. --Continue follow-up with your primary care physician regarding the management of your other chronic comorbid conditions.  Patient's questions and concerns were addressed to his satisfaction. He voices understanding of the instructions provided during this encounter.   This note was created using a voice recognition software as a  result there may be grammatical errors inadvertently enclosed that do not reflect the nature of this encounter. Every attempt is made to correct such errors.   Tessa Lerner, [place], [facility]  Pager:  [phone] Office: [phone]

## 2023-01-20 ENCOUNTER — Encounter: Payer: PPO | Admitting: *Deleted

## 2023-01-20 DIAGNOSIS — I213 ST elevation (STEMI) myocardial infarction of unspecified site: Secondary | ICD-10-CM

## 2023-01-20 DIAGNOSIS — G4733 Obstructive sleep apnea (adult) (pediatric): Secondary | ICD-10-CM | POA: Diagnosis not present

## 2023-01-20 DIAGNOSIS — R918 Other nonspecific abnormal finding of lung field: Secondary | ICD-10-CM | POA: Diagnosis not present

## 2023-01-20 DIAGNOSIS — Z955 Presence of coronary angioplasty implant and graft: Secondary | ICD-10-CM

## 2023-01-20 DIAGNOSIS — I729 Aneurysm of unspecified site: Secondary | ICD-10-CM | POA: Diagnosis not present

## 2023-01-20 DIAGNOSIS — J4530 Mild persistent asthma, uncomplicated: Secondary | ICD-10-CM | POA: Diagnosis not present

## 2023-01-20 DIAGNOSIS — Z5189 Encounter for other specified aftercare: Secondary | ICD-10-CM | POA: Diagnosis not present

## 2023-01-20 DIAGNOSIS — R5383 Other fatigue: Secondary | ICD-10-CM | POA: Diagnosis not present

## 2023-01-20 MED ORDER — LOSARTAN POTASSIUM 25 MG PO TABS
25.0000 mg | ORAL_TABLET | Freq: Every day | ORAL | Status: DC
Start: 2023-01-20 — End: 2023-03-03

## 2023-01-20 NOTE — Progress Notes (Signed)
Daily Session Note  Patient Details  Name: Joshua Lara MRN: [id number] Date of Birth: [birth year] Referring Provider:   Flowsheet Row Cardiac Rehab from 01/14/2023 in [facility] Cardiac and Pulmonary Rehab  Referring Provider [name]       Encounter Date: 01/20/2023  Check In:  Session Check In - 01/20/23 1143       Check-In   Supervising physician immediately available to respond to emergencies See telemetry face sheet for immediately available ER MD    Location [facility]-Cardiac & Pulmonary Rehab    Staff Present Rory Percy, MS, Exercise Physiologist;Yaa Donnellan, RN, BSN, CCRP;Noah Tickle, BS, Exercise Physiologist;Maxon Conetta BS, , Exercise Physiologist    Virtual Visit No    Medication changes reported     No    Fall or balance concerns reported    No    Warm-up and Cool-down Performed on first and last piece of equipment    Resistance Training Performed Yes    VAD Patient? No    PAD/SET Patient? No      Pain Assessment   Currently in Pain? No/denies                Social History   Tobacco Use  Smoking Status Former   Current packs/day: 0.00   Average packs/day: 2.0 packs/day for 30.0 years (60.0 ttl pk-yrs)   Types: Cigarettes   Start date: 05/12/1960   Quit date: 05/12/1990   Years since quitting: 32.7  Smokeless Tobacco Never    Goals Met:  Independence with exercise equipment Exercise tolerated well No report of concerns or symptoms today  Goals Unmet:  Not Applicable  Comments: Pt able to follow exercise prescription today without complaint.  Will continue to monitor for progression.    Dr. Bethann Punches is Medical Director for [facility] Cardiac Rehabilitation.  Dr. Vida Rigger is Medical Director for [facility] Pulmonary Rehabilitation.

## 2023-01-20 NOTE — Progress Notes (Signed)
Assessment start time: 10:32 AM  Accessibility to food: gets tired walking around the grocery store  Digestive issues/concerns: no known food allergies  24-hours Recall: B: honeybun  L: 3 pieces of fried chicken, slaw, dirty rice, biscuit with jelly  D: subway 6in - [place] cheese, peppers, onions, mayo, chipotle ranch S: ice cream bar  Beverages diet pepsi, or water, whole milk (gallon per week)  Intake Patterns Reports he doesn't have much structure in his eating habits. Snacks on junk food often, says ice cream is his favorite.    Education r/t nutrition plan Patient drinking some water, diet soda, but lately drinking a lot of whole milk, says skim milk has more sugar. Reviewed the labels and talked about how skim milk would still be better than whole milk, due to whole milks high saturated fat content. Recommended he choose reduce or no fat dairy options like cheese, ice cream, yogurt, and milk. Reviewed his 24hr food recall, he is ozempic now for 3 weeks. Asked if he is eating smaller portions now with the medication, he reports he is. Recall, has high calorie, poor quality choices like deli meat, sugary snacks, and fried foods. Spoke with him about changes to make at each meal. Encouraged less fried foods, grilled chicken as a proposed change. Encouraged more veggies at meals to counter balance the high calorie choices and reduce portions of carbs. He struggles to build heart healthy meals as he prefers "country cooking" with lots of sodium, saturated fat, and carb heavy meals. Reviewed mediterranean diet handout to focus on a few areas of change at a time. Built out some example meals with foods he likes and will eat, focus on more veggies, protein paired with controlled portions of carbs. Set goals to work on making more heart healthy changes. Reminded him that small changes are still changes, and could be built upon later into more large lifestyle changes as he progressed.      Goal 1: Eat  a controlled portion of carbs at each meal (30-60g each meal) and pair it with a good protein  Goal 2: Watch sodium intake, aim for less than 2300mg  per day  Goal 3: Cut back on fried foods and high saturated fat items  End time 11:21 AM

## 2023-01-21 LAB — GLUCOSE, CAPILLARY
Glucose-Capillary: 268 mg/dL — ABNORMAL HIGH (ref 70–99)
Glucose-Capillary: 212 mg/dL — ABNORMAL HIGH (ref 70–99)

## 2023-01-22 ENCOUNTER — Encounter: Payer: PPO | Admitting: *Deleted

## 2023-01-22 DIAGNOSIS — Z5189 Encounter for other specified aftercare: Secondary | ICD-10-CM | POA: Diagnosis not present

## 2023-01-22 DIAGNOSIS — Z955 Presence of coronary angioplasty implant and graft: Secondary | ICD-10-CM

## 2023-01-22 DIAGNOSIS — I213 ST elevation (STEMI) myocardial infarction of unspecified site: Secondary | ICD-10-CM

## 2023-01-22 LAB — GLUCOSE, CAPILLARY
Glucose-Capillary: 174 mg/dL — ABNORMAL HIGH (ref 70–99)
Glucose-Capillary: 125 mg/dL — ABNORMAL HIGH (ref 70–99)

## 2023-01-22 NOTE — Progress Notes (Signed)
Daily Session Note  Patient Details  Name: Joshua Lara MRN: [id number] Date of Birth: 10-Feb-1946 Referring Provider:   Flowsheet Row Cardiac Rehab from 01/14/2023 in [facility] Cardiac and Pulmonary Rehab  Referring Provider [name]       Encounter Date: 01/22/2023  Check In:  Session Check In - 01/22/23 1535       Check-In   Supervising physician immediately available to respond to emergencies See telemetry face sheet for immediately available ER MD    Location [facility]-Cardiac & Pulmonary Rehab    Staff Present Maxon Conetta BS, , Exercise Physiologist;Joseph Rod Can, RN BSN    Virtual Visit No    Medication changes reported     No    Fall or balance concerns reported    No    Warm-up and Cool-down Performed on first and last piece of equipment    Resistance Training Performed Yes    VAD Patient? No    PAD/SET Patient? No      Pain Assessment   Currently in Pain? No/denies                Social History   Tobacco Use  Smoking Status Former   Current packs/day: 0.00   Average packs/day: 2.0 packs/day for 30.0 years (60.0 ttl pk-yrs)   Types: Cigarettes   Start date: 05/12/1960   Quit date: 05/12/1990   Years since quitting: 32.7  Smokeless Tobacco Never    Goals Met:  Independence with exercise equipment Exercise tolerated well No report of concerns or symptoms today Strength training completed today  Goals Unmet:  Not Applicable  Comments: Pt able to follow exercise prescription today without complaint.  Will continue to monitor for progression.    Dr. Bethann Punches is Medical Director for [facility] Cardiac Rehabilitation.  Dr. Vida Rigger is Medical Director for [facility] Pulmonary Rehabilitation.

## 2023-01-27 ENCOUNTER — Encounter: Payer: PPO | Admitting: *Deleted

## 2023-01-27 DIAGNOSIS — Z955 Presence of coronary angioplasty implant and graft: Secondary | ICD-10-CM

## 2023-01-27 DIAGNOSIS — Z5189 Encounter for other specified aftercare: Secondary | ICD-10-CM | POA: Diagnosis not present

## 2023-01-27 DIAGNOSIS — I213 ST elevation (STEMI) myocardial infarction of unspecified site: Secondary | ICD-10-CM

## 2023-01-27 DIAGNOSIS — G4733 Obstructive sleep apnea (adult) (pediatric): Secondary | ICD-10-CM | POA: Diagnosis not present

## 2023-01-27 LAB — GLUCOSE, CAPILLARY
Glucose-Capillary: 177 mg/dL — ABNORMAL HIGH (ref 70–99)
Glucose-Capillary: 123 mg/dL — ABNORMAL HIGH (ref 70–99)

## 2023-01-27 NOTE — Progress Notes (Signed)
Daily Session Note  Patient Details  Name: Joshua Lara MRN: [id number] Date of Birth: [birth year] Referring Provider:   Flowsheet Row Cardiac Rehab from 01/14/2023 in [facility] Cardiac and Pulmonary Rehab  Referring Provider [name]       Encounter Date: 01/27/2023  Check In:  Session Check In - 01/27/23 1159       Check-In   Supervising physician immediately available to respond to emergencies See telemetry face sheet for immediately available ER MD    Location [facility]-Cardiac & Pulmonary Rehab    Staff Present Cora Collum, RN, BSN, CCRP;Betsy Coder, PhD, RN, CNS, CEN;Margaret Best, MS, Exercise Physiologist;Maxon [facility], , Exercise Physiologist    Virtual Visit No    Medication changes reported     No    Fall or balance concerns reported    No    Warm-up and Cool-down Performed on first and last piece of equipment    Resistance Training Performed Yes    VAD Patient? No    PAD/SET Patient? No      Pain Assessment   Currently in Pain? No/denies                Social History   Tobacco Use  Smoking Status Former   Current packs/day: 0.00   Average packs/day: 2.0 packs/day for 30.0 years (60.0 ttl pk-yrs)   Types: Cigarettes   Start date: 05/12/1960   Quit date: 05/12/1990   Years since quitting: 32.7  Smokeless Tobacco Never    Goals Met:  Exercise tolerated well Personal goals reviewed No report of concerns or symptoms today  Goals Unmet:  Not Applicable  Comments: Pt able to follow exercise prescription today without complaint.  Will continue to monitor for progression. First full day of exercise!  Patient was oriented to gym and equipment including functions, settings, policies, and procedures.  Patient's individual exercise prescription and treatment plan were reviewed.  All starting workloads were established based on the results of the 6 minute walk test done at initial orientation visit.  The plan for exercise progression was also introduced and  progression will be customized based on patient's performance and goals.    Dr. Bethann Punches is Medical Director for [facility] Cardiac Rehabilitation.  Dr. Vida Rigger is Medical Director for [facility] Pulmonary Rehabilitation.

## 2023-01-28 ENCOUNTER — Other Ambulatory Visit: Payer: Self-pay | Admitting: Cardiology

## 2023-01-28 DIAGNOSIS — Z955 Presence of coronary angioplasty implant and graft: Secondary | ICD-10-CM

## 2023-01-28 DIAGNOSIS — I252 Old myocardial infarction: Secondary | ICD-10-CM

## 2023-01-28 DIAGNOSIS — I2510 Atherosclerotic heart disease of native coronary artery without angina pectoris: Secondary | ICD-10-CM

## 2023-01-29 ENCOUNTER — Encounter: Payer: PPO | Admitting: *Deleted

## 2023-01-29 DIAGNOSIS — Z5189 Encounter for other specified aftercare: Secondary | ICD-10-CM | POA: Diagnosis not present

## 2023-01-29 DIAGNOSIS — I213 ST elevation (STEMI) myocardial infarction of unspecified site: Secondary | ICD-10-CM

## 2023-01-29 DIAGNOSIS — Z955 Presence of coronary angioplasty implant and graft: Secondary | ICD-10-CM

## 2023-01-29 NOTE — Progress Notes (Signed)
Daily Session Note  Patient Details  Name: Joshua Lara MRN: [id number] Date of Birth: 07-Feb-1946 Referring Provider:   Flowsheet Row Cardiac Rehab from 01/14/2023 in [facility] Cardiac and Pulmonary Rehab  Referring Provider [name]       Encounter Date: 01/29/2023  Check In:  Session Check In - 01/29/23 1138       Check-In   Supervising physician immediately available to respond to emergencies See telemetry face sheet for immediately available ER MD    Location [facility]-Cardiac & Pulmonary Rehab    Staff Present Ronette Deter, BS, Exercise Physiologist;Meredith Jewel Baize, RN BSN;Joseph Shelbie Proctor, RN, [place]    Virtual Visit No    Medication changes reported     No    Fall or balance concerns reported    No    Warm-up and Cool-down Performed on first and last piece of equipment    Resistance Training Performed Yes    VAD Patient? No    PAD/SET Patient? No      Pain Assessment   Currently in Pain? No/denies                Social History   Tobacco Use  Smoking Status Former   Current packs/day: 0.00   Average packs/day: 2.0 packs/day for 30.0 years (60.0 ttl pk-yrs)   Types: Cigarettes   Start date: 05/12/1960   Quit date: 05/12/1990   Years since quitting: 32.7  Smokeless Tobacco Never    Goals Met:  Independence with exercise equipment Exercise tolerated well No report of concerns or symptoms today Strength training completed today  Goals Unmet:  Not Applicable  Comments: Pt able to follow exercise prescription today without complaint.  Will continue to monitor for progression.    Dr. Bethann Punches is Medical Director for [facility] Cardiac Rehabilitation.  Dr. Vida Rigger is Medical Director for [facility] Pulmonary Rehabilitation.

## 2023-02-02 DIAGNOSIS — G4733 Obstructive sleep apnea (adult) (pediatric): Secondary | ICD-10-CM | POA: Diagnosis not present

## 2023-02-03 ENCOUNTER — Encounter: Payer: PPO | Admitting: *Deleted

## 2023-02-03 DIAGNOSIS — I213 ST elevation (STEMI) myocardial infarction of unspecified site: Secondary | ICD-10-CM

## 2023-02-03 DIAGNOSIS — Z5189 Encounter for other specified aftercare: Secondary | ICD-10-CM | POA: Diagnosis not present

## 2023-02-03 DIAGNOSIS — Z955 Presence of coronary angioplasty implant and graft: Secondary | ICD-10-CM

## 2023-02-03 NOTE — Progress Notes (Signed)
Daily Session Note  Patient Details  Name: Joshua Lara MRN: [id number] Date of Birth: 1945/06/01 Referring Provider:   Flowsheet Row Cardiac Rehab from 01/14/2023 in [facility] Cardiac and Pulmonary Rehab  Referring Provider [name]       Encounter Date: 02/03/2023  Check In:  Session Check In - 02/03/23 1107       Check-In   Supervising physician immediately available to respond to emergencies See telemetry face sheet for immediately available ER MD    Location [facility]-Cardiac & Pulmonary Rehab    Staff Present Ronette Deter, BS, Exercise Physiologist;Meredith Jewel Baize, RN BSN;Maxon Conetta BS, , Exercise Physiologist;Gloriann Riede Katrinka Blazing, RN, ADN    Virtual Visit No    Medication changes reported     No    Fall or balance concerns reported    No    Warm-up and Cool-down Performed on first and last piece of equipment    Resistance Training Performed Yes    VAD Patient? No    PAD/SET Patient? No      Pain Assessment   Currently in Pain? No/denies                Social History   Tobacco Use  Smoking Status Former   Current packs/day: 0.00   Average packs/day: 2.0 packs/day for 30.0 years (60.0 ttl pk-yrs)   Types: Cigarettes   Start date: 05/12/1960   Quit date: 05/12/1990   Years since quitting: 32.7  Smokeless Tobacco Never    Goals Met:  Independence with exercise equipment Exercise tolerated well No report of concerns or symptoms today Strength training completed today  Goals Unmet:  Not Applicable  Comments: Pt able to follow exercise prescription today without complaint.  Will continue to monitor for progression.    Dr. Bethann Punches is Medical Director for [facility] Cardiac Rehabilitation.  Dr. Vida Rigger is Medical Director for [facility] Pulmonary Rehabilitation.

## 2023-02-05 ENCOUNTER — Encounter: Payer: PPO | Admitting: *Deleted

## 2023-02-09 DIAGNOSIS — J449 Chronic obstructive pulmonary disease, unspecified: Secondary | ICD-10-CM | POA: Diagnosis not present

## 2023-02-10 ENCOUNTER — Encounter: Payer: PPO | Attending: Cardiology | Admitting: *Deleted

## 2023-02-10 DIAGNOSIS — Z955 Presence of coronary angioplasty implant and graft: Secondary | ICD-10-CM | POA: Insufficient documentation

## 2023-02-10 DIAGNOSIS — I213 ST elevation (STEMI) myocardial infarction of unspecified site: Secondary | ICD-10-CM | POA: Insufficient documentation

## 2023-02-10 NOTE — Progress Notes (Signed)
Daily Session Note  Patient Details  Name: Joshua Lara MRN: [id number] Date of Birth: 09/01/1945 Referring Provider:   Flowsheet Row Cardiac Rehab from 01/14/2023 in [facility] Cardiac and Pulmonary Rehab  Referring Provider [name]       Encounter Date: 02/10/2023  Check In:  Session Check In - 02/10/23 1142       Check-In   Supervising physician immediately available to respond to emergencies See telemetry face sheet for immediately available ER MD    Location [facility]-Cardiac & Pulmonary Rehab    Staff Present Maxon Conetta BS, , Exercise Physiologist;Meredith Jewel Baize, RN BSN;Margaret Best, MS, Exercise Physiologist;Jaslynne Dahan, RN, BSN, CCRP;Noah Tickle, BS, Exercise Physiologist    Virtual Visit No    Medication changes reported     No    Fall or balance concerns reported    No    Warm-up and Cool-down Performed on first and last piece of equipment    Resistance Training Performed Yes    VAD Patient? No    PAD/SET Patient? No      Pain Assessment   Currently in Pain? No/denies                Social History   Tobacco Use  Smoking Status Former   Current packs/day: 0.00   Average packs/day: 2.0 packs/day for 30.0 years (60.0 ttl pk-yrs)   Types: Cigarettes   Start date: 05/12/1960   Quit date: 05/12/1990   Years since quitting: 32.7  Smokeless Tobacco Never    Goals Met:  Independence with exercise equipment Exercise tolerated well No report of concerns or symptoms today  Goals Unmet:  Not Applicable  Comments: Pt able to follow exercise prescription today without complaint.  Will continue to monitor for progression.    Dr. Bethann Punches is Medical Director for [facility] Cardiac Rehabilitation.  Dr. Vida Rigger is Medical Director for [facility] Pulmonary Rehabilitation.

## 2023-02-11 ENCOUNTER — Encounter: Payer: Self-pay | Admitting: *Deleted

## 2023-02-11 DIAGNOSIS — I213 ST elevation (STEMI) myocardial infarction of unspecified site: Secondary | ICD-10-CM

## 2023-02-11 DIAGNOSIS — Z955 Presence of coronary angioplasty implant and graft: Secondary | ICD-10-CM

## 2023-02-11 NOTE — Progress Notes (Signed)
Cardiac Individual Treatment Plan  Patient Details  Name: IVICA RISENHOOVER MRN: [id number] Date of Birth: 1946/03/01 Referring Provider:   Flowsheet Row Cardiac Rehab from 01/14/2023 in [facility] Cardiac and Pulmonary Rehab  Referring Provider [name]       Initial Encounter Date:  Flowsheet Row Cardiac Rehab from 01/14/2023 in [facility] Cardiac and Pulmonary Rehab  Date 01/14/23       Visit Diagnosis: ST elevation myocardial infarction (STEMI), unspecified artery [facility])  Status post coronary artery stent placement  Patient's Home Medications on Admission:  Current Outpatient Medications:    acetaminophen (TYLENOL) 650 MG CR tablet, Take 650 mg by mouth every 8 (eight) hours as needed for pain., Disp: , Rfl:    albuterol (VENTOLIN HFA) 108 (90 Base) MCG/ACT inhaler, Inhale 1-2 puffs into the lungs every 4 (four) hours as needed for wheezing or shortness of breath., Disp: , Rfl:    apixaban (ELIQUIS) 5 MG TABS tablet, TAKE 1 TABLET BY MOUTH EVERY 12 HOURS (Patient taking differently: Take 5 mg by mouth every 12 (twelve) hours.), Disp: 60 tablet, Rfl: 10   Armodafinil 150 MG tablet, Take 150 mg by mouth daily., Disp: , Rfl:    BD PEN NEEDLE NANO 2ND GEN 32G X 4 MM MISC, , Disp: , Rfl:    clobetasol cream (TEMOVATE) 0.05 %, Apply 1 Application topically as needed (breakouts on left arm)., Disp: , Rfl:    clopidogrel (PLAVIX) 75 MG tablet, Take 1 tablet (75 mg total) by mouth daily., Disp: 90 tablet, Rfl: 1   Continuous Glucose Sensor (FREESTYLE LIBRE 2 SENSOR) MISC, Inject 1 Device into the skin every 14 (fourteen) days., Disp: , Rfl:    cyanocobalamin (VITAMIN B12) 500 MCG tablet, Take 500 mcg by mouth daily., Disp: , Rfl:    Evolocumab (REPATHA SURECLICK) 140 MG/ML SOAJ, Inject 140 mg into the skin every 14 (fourteen) days., Disp: 6 mL, Rfl: 1   fenofibrate (TRICOR) 145 MG tablet, TAKE 1 TABLET BY MOUTH EVERY DAY (Patient taking differently: Take 145 mg by mouth daily.), Disp: 90 tablet, Rfl: 1    fluticasone (FLONASE) 50 MCG/ACT nasal spray, Place 1 spray into both nostrils 2 (two) times daily., Disp: , Rfl:    folic acid (FOLVITE) 1 MG tablet, Take 1 mg by mouth daily., Disp: , Rfl:    HUMULIN R U-500 KWIKPEN 500 UNIT/ML kwikpen, Inject 125-150 Units into the skin 2 (two) times daily. , Disp: , Rfl:    latanoprost (XALATAN) 0.005 % ophthalmic solution, Place 1 drop into both eyes in the morning and at bedtime., Disp: , Rfl:    levothyroxine (SYNTHROID, LEVOTHROID) 100 MCG tablet, Take 100 mcg by mouth daily., Disp: , Rfl:    losartan (COZAAR) 25 MG tablet, Take 1 tablet (25 mg total) by mouth daily. Hold if systolic blood pressure (top number) less than 100 mmHg or pulse less than 60 bpm., Disp: , Rfl:    methotrexate 2.5 MG tablet, Take 2.5 mg by mouth once a week. (Patient not taking: Reported on 01/19/2023), Disp: , Rfl:    Multiple Vitamins-Minerals (CENTRUM SILVER PO), Take 1 tablet by mouth daily., Disp: , Rfl:    nitroGLYCERIN (NITROSTAT) 0.4 MG SL tablet, Place 1 tablet (0.4 mg total) under the tongue every 5 (five) minutes as needed for chest pain. If you require more than two tablets five minutes apart go to the nearest ER via EMS., Disp: 30 tablet, Rfl: 0   pantoprazole (PROTONIX) 40 MG tablet, Take 40 mg  by mouth daily., Disp: , Rfl:    pravastatin (PRAVACHOL) 40 MG tablet, TAKE 1 TABLET BY MOUTH DAILY AT 6 PM., Disp: 90 tablet, Rfl: 3   Semaglutide,0.25 or 0.5MG /DOS, (OZEMPIC, 0.25 OR 0.5 MG/DOSE,) 2 MG/3ML SOPN, INJECT 0.25 MG INTO THE SKIN ONCE A WEEK FOR 4 DOSES, Disp: 6 mL, Rfl: 0   sotalol (BETAPACE) 80 MG tablet, Take 0.5 tablets (40 mg total) by mouth 2 (two) times daily., Disp: , Rfl:    tamsulosin (FLOMAX) 0.4 MG CAPS capsule, Take 0.4 mg by mouth daily after supper., Disp: , Rfl:    Vitamin D, Ergocalciferol, (DRISDOL) 1.25 MG (50000 UNIT) CAPS capsule, Take 50,000 Units by mouth once a week., Disp: , Rfl:   Past Medical History: Past Medical History:  Diagnosis Date    Arthritis    "hands, back, ankles" (05/11/2014)   CHF (congestive heart failure) (HCC)    Chronic lower back pain    Compression fracture of lumbar vertebra (HCC)    Coronary artery disease    Daily headache    "recently" (05/11/2014)   Depression    "I might be slight" (05/11/2014)   GERD (gastroesophageal reflux disease)    High cholesterol    Hypertension    Hypothyroid    OSA treated with BiPAP    Pneumonia    "couple times; last time was 03/2011" (05/11/2014)   Type II diabetes mellitus (HCC)     Tobacco Use: Social History   Tobacco Use  Smoking Status Former   Current packs/day: 0.00   Average packs/day: 2.0 packs/day for 30.0 years (60.0 ttl pk-yrs)   Types: Cigarettes   Start date: 05/12/1960   Quit date: 05/12/1990   Years since quitting: 32.7  Smokeless Tobacco Never    Labs: Review Flowsheet  More data exists      Latest Ref Rng & Units 11/04/2016 12/17/2018 12/06/2022 12/07/2022 12/08/2022  Labs for ITP Cardiac and Pulmonary Rehab  Cholestrol 0 - 200 mg/dL - 161  98  - 93   LDL (calc) 0 - 99 mg/dL - 61  - - 30   Direct LDL 0 - 99 mg/dL - - - 36  35   HDL-C >09 mg/dL - 59  36  - 39   Trlycerides <150 mg/dL - 604  540  - 981   Hemoglobin A1c 4.8 - 5.6 % - 6.4  7.4  7.1  -  PH, Arterial 7.350 - 7.450 7.264  - - - -  PCO2 arterial 32.0 - 48.0 mmHg 48.6  - - - -  Bicarbonate 20.0 - 28.0 mmol/L 22.0  - - - -  TCO2 22 - 32 mmol/L 23  - - 23  -  Acid-base deficit 0.0 - 2.0 mmol/L 5.0  - - - -  O2 Saturation % 96.0  - - - -    Details             Exercise Target Goals: Exercise Program Goal: Individual exercise prescription set using results from initial 6 min walk test and THRR while considering  patient's activity barriers and safety.   Exercise Prescription Goal: Initial exercise prescription builds to 30-45 minutes a day of aerobic activity, 2-3 days per week.  Home exercise guidelines will be given to patient during program as part of exercise  prescription that the participant will acknowledge.   Education: Aerobic Exercise: - Group verbal and visual presentation on the components of exercise prescription. Introduces F.I.T.T principle from ACSM for exercise prescriptions.  Reviews F.I.T.T. principles of aerobic exercise including progression. Written material given at graduation. Flowsheet Row Cardiac Rehab from 01/14/2023 in [facility] Cardiac and Pulmonary Rehab  Education need identified 01/14/23       Education: Resistance Exercise: - Group verbal and visual presentation on the components of exercise prescription. Introduces F.I.T.T principle from ACSM for exercise prescriptions  Reviews F.I.T.T. principles of resistance exercise including progression. Written material given at graduation.    Education: Exercise & Equipment Safety: - Individual verbal instruction and demonstration of equipment use and safety with use of the equipment. Flowsheet Row Cardiac Rehab from 01/14/2023 in [facility] Cardiac and Pulmonary Rehab  Date 01/14/23  Educator MB  Instruction Review Code 1- Verbalizes Understanding       Education: Exercise Physiology & General Exercise Guidelines: - Group verbal and written instruction with models to review the exercise physiology of the cardiovascular system and associated critical values. Provides general exercise guidelines with specific guidelines to those with heart or lung disease.    Education: Flexibility, Balance, Mind/Body Relaxation: - Group verbal and visual presentation with interactive activity on the components of exercise prescription. Introduces F.I.T.T principle from ACSM for exercise prescriptions. Reviews F.I.T.T. principles of flexibility and balance exercise training including progression. Also discusses the mind body connection.  Reviews various relaxation techniques to help reduce and manage stress (i.e. Deep breathing, progressive muscle relaxation, and visualization). Balance handout provided  to take home. Written material given at graduation.   Activity Barriers & Risk Stratification:  Activity Barriers & Cardiac Risk Stratification - 01/14/23 1544       Activity Barriers & Cardiac Risk Stratification   Activity Barriers Back Problems;Arthritis;Deconditioning;Other (comment)    Comments Fatigue    Cardiac Risk Stratification Moderate             6 Minute Walk:  6 Minute Walk     Row Name 01/14/23 1542         6 Minute Walk   Phase Initial     Distance 530 feet     Walk Time 5.42 minutes     # of Rest Breaks 3     MPH 1.1     METS 0.43     RPE 15     Perceived Dyspnea  2     VO2 Peak 1.52     Symptoms Yes (comment)     Comments Back Pain, Knees     Resting HR 82 bpm     Resting BP 110/64     Resting Oxygen Saturation  95 %     Exercise Oxygen Saturation  during 6 min walk 96 %     Max Ex. HR 108 bpm     Max Ex. BP 112/80     2 Minute Post BP 110/78              Oxygen Initial Assessment:   Oxygen Re-Evaluation:   Oxygen Discharge (Final Oxygen Re-Evaluation):   Initial Exercise Prescription:  Initial Exercise Prescription - 01/14/23 1500       Date of Initial Exercise RX and Referring Provider   Date 01/14/23    Referring Provider [name]      Oxygen   Maintain Oxygen Saturation 88% or higher      Recumbant Bike   Level 1    RPM 50    Watts 15    Minutes 15    METs 0.43      NuStep   Level 1    SPM 80  Minutes 15    METs 0.43      T5 Nustep   Level 1    SPM 80    Minutes 15    METs 0.43      Biostep-RELP   Level 1    SPM 50    Minutes 15    METs 0.43      Track   Laps 5    Minutes 15    METs 1.27      Prescription Details   Frequency (times per week) 2    Duration Progress to 30 minutes of continuous aerobic without signs/symptoms of physical distress      Intensity   THRR 40-80% of Max Heartrate 106-131    Ratings of Perceived Exertion 11-13    Perceived Dyspnea 0-4      Progression    Progression Continue to progress workloads to maintain intensity without signs/symptoms of physical distress.      Resistance Training   Training Prescription Yes    Weight 3 lb    Reps 10-15             Perform Capillary Blood Glucose checks as needed.  Exercise Prescription Changes:   Exercise Prescription Changes     Row Name 01/14/23 1500 01/28/23 1000 02/09/23 0900         Response to Exercise   Blood Pressure (Admit) 110/64 118/64 122/70     Blood Pressure (Exercise) 112/80 132/70 160/84     Blood Pressure (Exit) 110/78 118/60 126/64     Heart Rate (Admit) 82 bpm 101 bpm 104 bpm     Heart Rate (Exercise) 108 bpm 115 bpm 113 bpm     Heart Rate (Exit) 100 bpm 104 bpm 98 bpm     Oxygen Saturation (Admit) 95 % -- --     Oxygen Saturation (Exercise) 96 % -- --     Oxygen Saturation (Exit) 96 % -- --     Rating of Perceived Exertion (Exercise) 15 15 15      Perceived Dyspnea (Exercise) 2 0 --     Symptoms Back and knee pain none none     Comments results First week of exercise --     Duration -- Progress to 30 minutes of  aerobic without signs/symptoms of physical distress Progress to 30 minutes of  aerobic without signs/symptoms of physical distress     Intensity -- THRR unchanged THRR unchanged       Progression   Progression Continue to progress workloads to maintain intensity without signs/symptoms of physical distress. Continue to progress workloads to maintain intensity without signs/symptoms of physical distress. Continue to progress workloads to maintain intensity without signs/symptoms of physical distress.     Average METs 0.43 2 2.3       Resistance Training   Training Prescription -- Yes Yes     Weight -- 3 lb 3 lb     Reps -- 10-15 10-15       Interval Training   Interval Training -- No No       Recumbant Bike   Level -- 1 2     Watts -- 16 19     Minutes -- 15 15     METs -- -- 2.49       NuStep   Level -- 2 1     Minutes -- 15 15      METs -- 2 1.8       T5 Nustep   Level --  1 3     Minutes -- 15 15     METs -- 2 2       Biostep-RELP   Level -- -- 1     Minutes -- -- 15       Track   Laps -- -- 31     Minutes -- -- 15     METs -- -- 2.69       Oxygen   Maintain Oxygen Saturation -- 88% or higher 88% or higher              Exercise Comments:   Exercise Comments     Row Name 01/27/23 1200           Exercise Comments First full day of exercise!  Patient was oriented to gym and equipment including functions, settings, policies, and procedures.  Patient's individual exercise prescription and treatment plan were reviewed.  All starting workloads were established based on the results of the 6 minute walk test done at initial orientation visit.  The plan for exercise progression was also introduced and progression will be customized based on patient's performance and goals.                Exercise Goals and Review:   Exercise Goals     Row Name 01/14/23 1550             Exercise Goals   Increase Physical Activity Yes       Intervention Provide advice, education, support and counseling about physical activity/exercise needs.;Develop an individualized exercise prescription for aerobic and resistive training based on initial evaluation findings, risk stratification, comorbidities and participant's personal goals.       Expected Outcomes Short Term: Attend rehab on a regular basis to increase amount of physical activity.;Long Term: Exercising regularly at least 3-5 days a week.;Long Term: Add in home exercise to make exercise part of routine and to increase amount of physical activity.       Increase Strength and Stamina Yes       Intervention Provide advice, education, support and counseling about physical activity/exercise needs.;Develop an individualized exercise prescription for aerobic and resistive training based on initial evaluation findings, risk stratification, comorbidities and participant's  personal goals.       Expected Outcomes Short Term: Increase workloads from initial exercise prescription for resistance, speed, and METs.;Long Term: Improve cardiorespiratory fitness, muscular endurance and strength as measured by increased METs and functional capacity ( );Short Term: Perform resistance training exercises routinely during rehab and add in resistance training at home       Able to understand and use rate of perceived exertion (RPE) scale Yes       Intervention Provide education and explanation on how to use RPE scale       Expected Outcomes Short Term: Able to use RPE daily in rehab to express subjective intensity level;Long Term:  Able to use RPE to guide intensity level when exercising independently       Able to understand and use Dyspnea scale Yes       Intervention Provide education and explanation on how to use Dyspnea scale       Expected Outcomes Short Term: Able to use Dyspnea scale daily in rehab to express subjective sense of shortness of breath during exertion;Long Term: Able to use Dyspnea scale to guide intensity level when exercising independently       Knowledge and understanding of Target Heart Rate Range (THRR) Yes  Intervention Provide education and explanation of THRR including how the numbers were predicted and where they are located for reference       Expected Outcomes Short Term: Able to state/look up THRR;Long Term: Able to use THRR to govern intensity when exercising independently;Short Term: Able to use daily as guideline for intensity in rehab       Able to check pulse independently Yes       Intervention Provide education and demonstration on how to check pulse in carotid and radial arteries.;Review the importance of being able to check your own pulse for safety during independent exercise       Expected Outcomes Short Term: Able to explain why pulse checking is important during independent exercise;Long Term: Able to check pulse independently and  accurately       Understanding of Exercise Prescription Yes       Intervention Provide education, explanation, and written materials on patient's individual exercise prescription       Expected Outcomes Short Term: Able to explain program exercise prescription;Long Term: Able to explain home exercise prescription to exercise independently                Exercise Goals Re-Evaluation :  Exercise Goals Re-Evaluation     Row Name 01/27/23 1200 01/28/23 1035 02/09/23 0939         Exercise Goal Re-Evaluation   Exercise Goals Review Able to understand and use rate of perceived exertion (RPE) scale;Knowledge and understanding of Target Heart Rate Range (THRR);Able to understand and use Dyspnea scale;Understanding of Exercise Prescription Increase Physical Activity;Increase Strength and Stamina;Understanding of Exercise Prescription Increase Physical Activity;Increase Strength and Stamina;Understanding of Exercise Prescription     Comments Reviewed RPE  and dyspnea scale, THR and program prescription with pt today.  Pt voiced understanding and was given a copy of goals to take home.       Short: Use RPE daily to regulate intensity.  Long: Follow program prescription in THR. Ed is off to a good start in the program. He was able to increase the level on the T4 nustep to 2. He also used both the recumbent bike and T5 nustep on level 1. We will continue to monitor his progress in the program. Link Snuffer is doing well in the program. He recently was able to walk 31 laps on the track, but only walked once since the last review. He also improved to level 3 on the T5 nustep and level 2 on the recumbent bike. He continues to do well with 3 lb hand weights for resistance training as well. We will continue to monitor his progress in the program.     Expected Outcomes Short: Use RPE daily to regulate intensity. Long: Follow program prescription in THR. Short: Continue to follow current exercise prescription, and  progressively increase workloads. Long: Continue exercise to improve strength and stamina. Short: Continue to walk the track consistently. Long: Continue exercise to improve strength and stamina.              Discharge Exercise Prescription (Final Exercise Prescription Changes):  Exercise Prescription Changes - 02/09/23 0900       Response to Exercise   Blood Pressure (Admit) 122/70    Blood Pressure (Exercise) 160/84    Blood Pressure (Exit) 126/64    Heart Rate (Admit) 104 bpm    Heart Rate (Exercise) 113 bpm    Heart Rate (Exit) 98 bpm    Rating of Perceived Exertion (Exercise) 15    Symptoms  none    Duration Progress to 30 minutes of  aerobic without signs/symptoms of physical distress    Intensity THRR unchanged      Progression   Progression Continue to progress workloads to maintain intensity without signs/symptoms of physical distress.    Average METs 2.3      Resistance Training   Training Prescription Yes    Weight 3 lb    Reps 10-15      Interval Training   Interval Training No      Recumbant Bike   Level 2    Watts 19    Minutes 15    METs 2.49      NuStep   Level 1    Minutes 15    METs 1.8      T5 Nustep   Level 3    Minutes 15    METs 2      Biostep-RELP   Level 1    Minutes 15      Track   Laps 31    Minutes 15    METs 2.69      Oxygen   Maintain Oxygen Saturation 88% or higher             Nutrition:  Target Goals: Understanding of nutrition guidelines, daily intake of sodium 1500mg , cholesterol 200mg , calories 30% from fat and 7% or less from saturated fats, daily to have 5 or more servings of fruits and vegetables.  Education: All About Nutrition: -Group instruction provided by verbal, written material, interactive activities, discussions, models, and posters to present general guidelines for heart healthy nutrition including fat, fiber, MyPlate, the role of sodium in heart healthy nutrition, utilization of the nutrition  label, and utilization of this knowledge for meal planning. Follow up email sent as well. Written material given at graduation. Flowsheet Row Cardiac Rehab from 01/14/2023 in [facility] Cardiac and Pulmonary Rehab  Education need identified 01/14/23       Biometrics:  Pre Biometrics - 01/14/23 1551       Pre Biometrics   Height 5' 9.2" (1.758 m)    Weight 270 lb 8 oz (122.7 kg)    Waist Circumference 52 inches    Hip Circumference 52 inches    Waist to Hip Ratio 1 %    BMI (Calculated) 39.7    Single Leg Stand 4.5 seconds              Nutrition Therapy Plan and Nutrition Goals:  Nutrition Therapy & Goals - 01/20/23 1140       Nutrition Therapy   Diet Carb controlled, cardiac, low Na    Protein (specify units) 90    Fiber 30 grams    Whole Grain Foods 3 servings    Saturated Fats 15 max. grams    Fruits and Vegetables 5 servings/day    Sodium 2 grams      Personal Nutrition Goals   Nutrition Goal Eat a controlled portion of carbs at each meal (30-60g each meal) and pair it with a good protein    Personal Goal #2 Watch sodium intake, aim for less than 2300mg  per day    Personal Goal #3 Cut back on fried foods and high saturated fat items    Comments Patient drinking some water, diet soda, but lately drinking a lot of whole milk, says skim milk has more sugar. Reviewed the labels and talked about how skim milk would still be better than whole milk, due to whole milks high saturated  fat content. Recommended he choose reduce or no fat dairy options like cheese, ice cream, yogurt, and milk. Reviewed his 24hr food recall, he is ozempic now for 3 weeks. Asked if he is eating smaller portions now with the medication, he reports he is. Recall, has high calorie, poor quality choices like deli meat, sugary snacks, and fried foods. Spoke with him about changes to make at each meal. Encouraged less fried foods, grilled chicken as a proposed change. Encouraged more veggies at meals to counter  balance the high calorie choices and reduce portions of carbs. He struggles to build heart healthy meals as he prefers "country cooking" with lots of sodium, saturated fat, and carb heavy meals. Reviewed mediterranean diet handout to focus on a few areas of change at a time. Built out some example meals with foods he likes and will eat, focus on more veggies, protein paired with controlled portions of carbs. Set goals to work on making more heart healthy changes. Reminded him that small changes are still changes, and could be built upon later into more large lifestyle changes as he progressed.      Intervention Plan   Intervention Prescribe, educate and counsel regarding individualized specific dietary modifications aiming towards targeted core components such as weight, hypertension, lipid management, diabetes, heart failure and other comorbidities.;Nutrition handout(s) given to patient.    Expected Outcomes Short Term Goal: Understand basic principles of dietary content, such as calories, fat, sodium, cholesterol and nutrients.;Short Term Goal: A plan has been developed with personal nutrition goals set during dietitian appointment.;Long Term Goal: Adherence to prescribed nutrition plan.             Nutrition Assessments:  MEDIFICTS Score Key: >=70 Need to make dietary changes  40-70 Heart Healthy Diet <= 40 Therapeutic Level Cholesterol Diet  Flowsheet Row Cardiac Rehab from 01/14/2023 in [facility] Cardiac and Pulmonary Rehab  Picture Your Plate Total Score on Admission 49      Picture Your Plate Scores: <01 Unhealthy dietary pattern with much room for improvement. 41-50 Dietary pattern unlikely to meet recommendations for good health and room for improvement. 51-60 More healthful dietary pattern, with some room for improvement.  >60 Healthy dietary pattern, although there may be some specific behaviors that could be improved.    Nutrition Goals Re-Evaluation:  Nutrition Goals  Re-Evaluation     Row Name 02/10/23 1344             Goals   Comment Link Snuffer has been tyring to incoporate what he learned from his RD appt into his normal routine. He has noticed that since starting ozempic, he finds himself not eating his usual sized portions. He brings snacks with him in case his sugar gets low. He wants to try to continue working on his sodium and protein intake       Expected Outcome Short: read food labels and plan meals out for the day. Long: independently manage a heart healthy diet.                Nutrition Goals Discharge (Final Nutrition Goals Re-Evaluation):  Nutrition Goals Re-Evaluation - 02/10/23 1344       Goals   Comment Eddie has been tyring to incoporate what he learned from his RD appt into his normal routine. He has noticed that since starting ozempic, he finds himself not eating his usual sized portions. He brings snacks with him in case his sugar gets low. He wants to try to continue working on his sodium  and protein intake    Expected Outcome Short: read food labels and plan meals out for the day. Long: independently manage a heart healthy diet.             Psychosocial: Target Goals: Acknowledge presence or absence of significant depression and/or stress, maximize coping skills, provide positive support system. Participant is able to verbalize types and ability to use techniques and skills needed for reducing stress and depression.   Education: Stress, Anxiety, and Depression - Group verbal and visual presentation to define topics covered.  Reviews how body is impacted by stress, anxiety, and depression.  Also discusses healthy ways to reduce stress and to treat/manage anxiety and depression.  Written material given at graduation.   Education: Sleep Hygiene -Provides group verbal and written instruction about how sleep can affect your health.  Define sleep hygiene, discuss sleep cycles and impact of sleep habits. Review good sleep  hygiene tips.    Initial Review & Psychosocial Screening:  Initial Psych Review & Screening - 12/31/22 1338       Initial Review   Current issues with None Identified      Family Dynamics   Good Support System? Yes   children     Barriers   Psychosocial barriers to participate in program There are no identifiable barriers or psychosocial needs.      Screening Interventions   Interventions Encouraged to exercise;To provide support and resources with identified psychosocial needs;Provide feedback about the scores to participant    Expected Outcomes Short Term goal: Utilizing psychosocial counselor, staff and physician to assist with identification of specific Stressors or current issues interfering with healing process. Setting desired goal for each stressor or current issue identified.;Long Term Goal: Stressors or current issues are controlled or eliminated.;Short Term goal: Identification and review with participant of any Quality of Life or Depression concerns found by scoring the questionnaire.;Long Term goal: The participant improves quality of Life and PHQ9 Scores as seen by post scores and/or verbalization of changes             Quality of Life Scores:   Quality of Life - 01/14/23 1552       Quality of Life   Select Quality of Life      Quality of Life Scores   Health/Function Pre 9.97 %    Socioeconomic Pre 19.93 %    Psych/Spiritual Pre 15.08 %    Family Pre 16.5 %    GLOBAL Pre 14 %            Scores of 19 and below usually indicate a poorer quality of life in these areas.  A difference of  2-3 points is a clinically meaningful difference.  A difference of 2-3 points in the total score of the Quality of Life Index has been associated with significant improvement in overall quality of life, self-image, physical symptoms, and general health in studies assessing change in quality of life.  PHQ-9: Review Flowsheet       01/14/2023 08/23/2018 01/14/2018 07/12/2015   Depression screen PHQ 2/9  Decreased Interest 2 3 1 1   Down, Depressed, Hopeless 3 3 1  0  PHQ - 2 Score 5 6 2 1   Altered sleeping 2 - - -  Tired, decreased energy 3 - - -  Change in appetite 3 - - -  Feeling bad or failure about yourself  1 - - -  Trouble concentrating 2 - - -  Moving slowly or fidgety/restless 0 - - -  Suicidal thoughts 1 - - -  PHQ-9 Score 17 - - -  Difficult doing work/chores Somewhat difficult - - -    Details           Interpretation of Total Score  Total Score Depression Severity:  1-4 = Minimal depression, 5-9 = Mild depression, 10-14 = Moderate depression, 15-19 = Moderately severe depression, 20-27 = Severe depression   Psychosocial Evaluation and Intervention:  Psychosocial Evaluation - 12/31/22 1351       Psychosocial Evaluation & Interventions   Interventions Encouraged to exercise with the program and follow exercise prescription    Comments Ediie has no bariers to attending the program. He is recently widowed (5 years). HIs children are his support.  His daughter,that works at the hospital) helps him keep his medications straight.  He wants to get back to a healthier exercie routine and work on losing about 60 lbs. 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  He does not feel that he has any stress or depression.    Expected Outcomes STG Eddie attends all scheduled sessions, works on exercise and nutrition goals. LTG Link Snuffer continues to work on exercise and nutrition goals after discharge. USing tools and resources he learned about in program    Continue Psychosocial Services  Follow up required by staff             Psychosocial Re-Evaluation:  Psychosocial Re-Evaluation     Row Name 02/10/23 1341             Psychosocial Re-Evaluation   Current issues with Current Stress Concerns       Comments Link Snuffer reports his current stress concern is financial. He had to pay a lot of money to the government over the last  few months. His fridge also gave out this past weekend and he had to pay to replace it. 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 He switched to weekends because it isn't as busy as during the week. He is trying to get up and move more when he is at home and at work, so he is setting an alarm almost every hour to remind himself to get up and take some laps. He enjoys the program so far and is looking forward to continuing to attend. He is grateful for his kids and support system for helping look after him and he is motivated to keep his independence.       Expected Outcomes Short: continue coming to cardiac rehab consistently. Long: graduate from the program.       Interventions Encouraged to attend Cardiac Rehabilitation for the exercise       Continue Psychosocial Services  Follow up required by staff                Psychosocial Discharge (Final Psychosocial Re-Evaluation):  Psychosocial Re-Evaluation - 02/10/23 1341       Psychosocial Re-Evaluation   Current issues with Current Stress Concerns    Comments Link Snuffer reports his current stress concern is financial. He had to pay a lot of money to the government over the last few months. His fridge also gave out this past weekend and he had to pay to replace it. 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 He switched to weekends because it isn't as busy as during the week. He is trying to get up and move more when  he is at home and at work, so he is setting an alarm almost every hour to remind himself to get up and take some laps. He enjoys the program so far and is looking forward to continuing to attend. He is grateful for his kids and support system for helping look after him and he is motivated to keep his independence.    Expected Outcomes Short: continue coming to cardiac rehab consistently. Long: graduate from the program.    Interventions Encouraged to attend Cardiac Rehabilitation  for the exercise    Continue Psychosocial Services  Follow up required by staff             Vocational Rehabilitation: Provide vocational rehab assistance to qualifying candidates.   Vocational Rehab Evaluation & Intervention:  Vocational Rehab - 12/31/22 1343       Initial Vocational Rehab Evaluation & Intervention   Assessment shows need for Vocational Rehabilitation No      Vocational Rehab Re-Evaulation   Comments does work part time on weekend             Education: Education Goals: Education classes will be provided on a variety of topics geared toward better understanding of heart health and risk factor modification. Participant will state understanding/return demonstration of topics presented as noted by education test scores.  Learning Barriers/Preferences:   General Cardiac Education Topics:  AED/CPR: - Group verbal and written instruction with the use of models to demonstrate the basic use of the AED with the basic ABC's of resuscitation.   Anatomy and Cardiac Procedures: - Group verbal and visual presentation and models provide information about basic cardiac anatomy and function. Reviews the testing methods done to diagnose heart disease and the outcomes of the test results. Describes the treatment choices: Medical Management, Angioplasty, or Coronary Bypass Surgery for treating various heart conditions including Myocardial Infarction, Angina, Valve Disease, and Cardiac Arrhythmias.  Written material given at graduation. Flowsheet Row Cardiac Rehab from 01/14/2023 in [facility] Cardiac and Pulmonary Rehab  Education need identified 01/14/23       Medication Safety: - Group verbal and visual instruction to review commonly prescribed medications for heart and lung disease. Reviews the medication, class of the drug, and side effects. Includes the steps to properly store meds and maintain the prescription regimen.  Written material given at  graduation.   Intimacy: - Group verbal instruction through game format to discuss how heart and lung disease can affect sexual intimacy. Written material given at graduation..   Know Your Numbers and Heart Failure: - Group verbal and visual instruction to discuss disease risk factors for cardiac and pulmonary disease and treatment options.  Reviews associated critical values for Overweight/Obesity, Hypertension, Cholesterol, and Diabetes.  Discusses basics of heart failure: signs/symptoms and treatments.  Introduces Heart Failure Zone chart for action plan for heart failure.  Written material given at graduation. Flowsheet Row Cardiac Rehab from 01/14/2023 in [facility] Cardiac and Pulmonary Rehab  Education need identified 01/14/23       Infection Prevention: - Provides verbal and written material to individual with discussion of infection control including proper hand washing and proper equipment cleaning during exercise session. Flowsheet Row Cardiac Rehab from 01/14/2023 in [facility] Cardiac and Pulmonary Rehab  Date 01/14/23  Educator MB  Instruction Review Code 1- Verbalizes Understanding       Falls Prevention: - Provides verbal and written material to individual with discussion of falls prevention and safety. Flowsheet Row Cardiac Rehab from 01/14/2023 in [facility] Cardiac and Pulmonary  Rehab  Date 01/14/23  Educator MB  Instruction Review Code 1- Verbalizes Understanding       Other: -Provides group and verbal instruction on various topics (see comments)   Knowledge Questionnaire Score:  Knowledge Questionnaire Score - 01/14/23 1555       Knowledge Questionnaire Score   Pre Score 20/26             Core Components/Risk Factors/Patient Goals at Admission:  Personal Goals and Risk Factors at Admission - 01/14/23 1555       Core Components/Risk Factors/Patient Goals on Admission    Weight Management Yes;Weight Loss    Intervention Weight Management: Develop a combined  nutrition and exercise program designed to reach desired caloric intake, while maintaining appropriate intake of nutrient and fiber, sodium and fats, and appropriate energy expenditure required for the weight goal.;Weight Management: Provide education and appropriate resources to help participant work on and attain dietary goals.;Weight Management/Obesity: Establish reasonable short term and long term weight goals.    Admit Weight 262 lb (118.8 kg)    Goal Weight: Short Term 260 lb (117.9 kg)    Goal Weight: Long Term 200 lb (90.7 kg)    Expected Outcomes Short Term: Continue to assess and modify interventions until short term weight is achieved;Long Term: Adherence to nutrition and physical activity/exercise program aimed toward attainment of established weight goal;Weight Loss: Understanding of general recommendations for a balanced deficit meal plan, which promotes 1-2 lb weight loss per week and includes a negative energy balance of [phone] kcal/d;Understanding recommendations for meals to include 15-35% energy as protein, 25-35% energy from fat, 35-60% energy from carbohydrates, less than 200mg  of dietary cholesterol, 20-35 gm of total fiber daily;Understanding of distribution of calorie intake throughout the day with the consumption of 4-5 meals/snacks    Diabetes Yes    Intervention Provide education about signs/symptoms and action to take for hypo/hyperglycemia.;Provide education about proper nutrition, including hydration, and aerobic/resistive exercise prescription along with prescribed medications to achieve blood glucose in normal ranges: Fasting glucose 65-99 mg/dL    Expected Outcomes Short Term: Participant verbalizes understanding of the signs/symptoms and immediate care of hyper/hypoglycemia, proper foot care and importance of medication, aerobic/resistive exercise and nutrition plan for blood glucose control.;Long Term: Attainment of HbA1C < 7%.    Hypertension Yes    Intervention Provide  education on lifestyle modifcations including regular physical activity/exercise, weight management, moderate sodium restriction and increased consumption of fresh fruit, vegetables, and low fat dairy, alcohol moderation, and smoking cessation.;Monitor prescription use compliance.    Expected Outcomes Short Term: Continued assessment and intervention until BP is < 140/72mm HG in hypertensive participants. < 130/50mm HG in hypertensive participants with diabetes, heart failure or chronic kidney disease.;Long Term: Maintenance of blood pressure at goal levels.    Lipids Yes    Intervention Provide education and support for participant on nutrition & aerobic/resistive exercise along with prescribed medications to achieve LDL 70mg , HDL >40mg .    Expected Outcomes Short Term: Participant states understanding of desired cholesterol values and is compliant with medications prescribed. Participant is following exercise prescription and nutrition guidelines.;Long Term: Cholesterol controlled with medications as prescribed, with individualized exercise RX and with personalized nutrition plan. Value goals: LDL < 70mg , HDL > 40 mg.             Education:Diabetes - Individual verbal and written instruction to review signs/symptoms of diabetes, desired ranges of glucose level fasting, after meals and with exercise. Acknowledge that pre and post exercise glucose  checks will be done for 3 sessions at entry of program. Flowsheet Row Cardiac Rehab from 01/14/2023 in [facility] Cardiac and Pulmonary Rehab  Date 01/14/23  Educator MB  Instruction Review Code 1- Verbalizes Understanding       Core Components/Risk Factors/Patient Goals Review:   Goals and Risk Factor Review     Row Name 02/10/23 1249             Core Components/Risk Factors/Patient Goals Review   Personal Goals Review Diabetes;Hypertension;Lipids       Review Link Snuffer has enjoyed the program the few weeks he has been here. He feels encouraged to  start being more mobile. He recently read an article about how important movement is so he has been setting an alarm on his phone to take a couple laps around the house every hour. He can tell he is already starting to feel better between more exercise in the program and paying more attention to what he eats. He has noticed his sugar has been lower at times than usual, so he is using his libra monitor and paying closer attention to his how many units of insulin he is giving himself. He also started ozempic recently so he is aware of how this will change his eating habits and CBG readings. He using his Repatha correctly and states he is trying to monitor his cholesterol and wants to keep exercising to help.       Expected Outcomes Short: attend cardiac rehab for education on cholesterol management and continue closely monitoring his CBG readings. Long: independently manage risk factors.                Core Components/Risk Factors/Patient Goals at Discharge (Final Review):   Goals and Risk Factor Review - 02/10/23 1249       Core Components/Risk Factors/Patient Goals Review   Personal Goals Review Diabetes;Hypertension;Lipids    Review Link Snuffer has enjoyed the program the few weeks he has been here. He feels encouraged to start being more mobile. He recently read an article about how important movement is so he has been setting an alarm on his phone to take a couple laps around the house every hour. He can tell he is already starting to feel better between more exercise in the program and paying more attention to what he eats. He has noticed his sugar has been lower at times than usual, so he is using his libra monitor and paying closer attention to his how many units of insulin he is giving himself. He also started ozempic recently so he is aware of how this will change his eating habits and CBG readings. He using his Repatha correctly and states he is trying to monitor his cholesterol and wants to keep  exercising to help.    Expected Outcomes Short: attend cardiac rehab for education on cholesterol management and continue closely monitoring his CBG readings. Long: independently manage risk factors.             ITP Comments:  ITP Comments     Row Name 12/31/22 1356 01/14/23 1541 01/27/23 1159 02/11/23 1225     ITP Comments Virtual orientation call completed today. he has an appointment on Date: 01/14/2023  for EP eval and gym Orientation.  Documentation of diagnosis [facility] be found in [facility] 12/06/2022 . Completed and gym orientation. Initial ITP created and sent for review to Bethann Punches, Medical Director. First full day of exercise!  Patient was oriented to gym and equipment including functions,  settings, policies, and procedures.  Patient's individual exercise prescription and treatment plan were reviewed.  All starting workloads were established based on the results of the 6 minute walk test done at initial orientation visit.  The plan for exercise progression was also introduced and progression will be customized based on patient's performance and goals. 30 Day review completed. Medical Director ITP review done, changes made as directed, and signed approval by Medical Director.    new to program             Comments:

## 2023-02-12 ENCOUNTER — Encounter: Payer: Self-pay | Admitting: Pulmonary Disease

## 2023-02-12 ENCOUNTER — Ambulatory Visit: Payer: PPO | Admitting: Pulmonary Disease

## 2023-02-12 ENCOUNTER — Encounter: Payer: PPO | Admitting: *Deleted

## 2023-02-12 VITALS — BP 114/75 | HR 95 | Ht 70.0 in | Wt 268.8 lb

## 2023-02-12 DIAGNOSIS — G4733 Obstructive sleep apnea (adult) (pediatric): Secondary | ICD-10-CM | POA: Diagnosis not present

## 2023-02-12 DIAGNOSIS — I213 ST elevation (STEMI) myocardial infarction of unspecified site: Secondary | ICD-10-CM | POA: Diagnosis not present

## 2023-02-12 DIAGNOSIS — Z955 Presence of coronary angioplasty implant and graft: Secondary | ICD-10-CM

## 2023-02-12 DIAGNOSIS — R0609 Other forms of dyspnea: Secondary | ICD-10-CM | POA: Diagnosis not present

## 2023-02-12 MED ORDER — AMOXICILLIN-POT CLAVULANATE 875-125 MG PO TABS: 1.0000 | ORAL_TABLET | Freq: Two times a day (BID) | ORAL | 0 refills | Status: AC

## 2023-02-12 NOTE — Progress Notes (Signed)
@Patient  ID: Joshua Lara, male    DOB: Apr 09, 1946, 77 y.o.   MRN: [id number]  Chief Complaint  Patient presents with  . Consult    Copd / asthma possible, sob     Referring provider: Lonie Peak, PA-C  HPI:   77 y.o. man whom are seen for evaluation of dyspnea on exertion.  Multiple prior pulmonary notes from clinic in [place] reviewed.  Discharge summary 11/2022 reviewed.  Multiple cardiology notes reviewed.  Previously followed by a pulmonary doctor in [place].  Mainly for OSA.  On auto titrating BiPAP.  2 L bled in.  Had dyspnea for some time.  Worse on inclines or stairs.  Uses oxygen with exertion as prescribed in the past.  Prescribed Wixela.  Does not seem to help much.  Use albuterol at night as part of his nighttime routine.  Not sure it helped much during the day.  PFTs in the past have not been consistent with COPD.  Some concern for asthma in the past.  He had progressive cough throughout the spring.  Worsened a bit through the early summer.  Eventually ended having severe chest pain and shortness of breath went to the ED.  Diagnosed with inferior STEMI status post PCI to the RCA.  Since then cough is essentially gone.  Much better.  Most recent chest x-ray 11/2022 reviewed interpreted as clear lungs with mild hyperinflation.  CT angio chest 12/2015 images reviewed interpret as clear lungs bilaterally.  Former smoker, 60-pack-year history.  Quit in the 90s.  Questionaires / Pulmonary Flowsheets:   ACT:      No data to display          MMRC:     No data to display          Epworth:      No data to display          Tests:   FENO:  No results found for: "NITRICOXIDE"  PFT:    Latest Ref Rng & Units 07/03/2014    9:54 AM  PFT Results  FVC-Pre L 2.86   FVC-Predicted Pre % 92   FVC-Post L 3.03   FVC-Predicted Post % 97   Pre FEV1/FVC % % 79   Post FEV1/FCV % % 77   FEV1-Pre L 2.26   FEV1-Predicted Pre % 99   FEV1-Post L 2.33   DLCO  uncorrected ml/min/mmHg 20.93   DLCO UNC% % 103   DLVA Predicted % 115   TLC L 5.01   TLC % Predicted % 96   RV % Predicted % 113   2016 personally viewed and interpreted as normal spirometry, lung volumes within normal limits, DLCO within normal limits Spirometry 12/2022 no fixed obstruction, FVC reduced suggestive of moderate restriction versus air trapping  WALK:      No data to display          Imaging: Personally reviewed and as per EMR discussion this note No results found.  Lab Results: Personally reviewed CBC    Component Value Date/Time   WBC 8.3 12/07/2022 0450   RBC 4.53 12/07/2022 0450   HGB 14.2 12/07/2022 0450   HGB 12.2 (L) 11/04/2016 0334   HCT 41.3 12/07/2022 0450   HCT 37.0 (L) 06/11/2013 1836   PLT 216 12/07/2022 0450   PLT 217 06/11/2013 1836   MCV 91.2 12/07/2022 0450   MCV 92 06/11/2013 1836   MCH 31.3 12/07/2022 0450   MCHC 34.4 12/07/2022 0450   RDW 13.7  12/07/2022 0450   RDW 14.9 (H) 06/11/2013 1836   LYMPHSABS 1.0 12/06/2022 2334   MONOABS 1.1 (H) 12/06/2022 2334   EOSABS 0.5 12/06/2022 2334   BASOSABS 0.1 12/06/2022 2334    BMET    Component Value Date/Time   NA 139 12/12/2022 1358   NA 137 06/11/2013 1836   K 4.1 12/12/2022 1358   K 3.6 06/11/2013 1836   CL 102 12/12/2022 1358   CL 104 06/11/2013 1836   CO2 25 12/12/2022 1358   CO2 26 06/11/2013 1836   GLUCOSE 176 (H) 12/12/2022 1358   GLUCOSE 228 (H) 12/09/2022 0334   GLUCOSE 172 (H) 06/11/2013 1836   BUN 13 12/12/2022 1358   BUN 19 (H) 06/11/2013 1836   CREATININE 1.04 12/12/2022 1358   CREATININE 0.97 06/11/2013 1836   CALCIUM 9.7 12/12/2022 1358   CALCIUM 9.8 06/11/2013 1836   GFRNONAA 53 (L) 12/09/2022 0334   GFRNONAA >60 06/11/2013 1836   GFRAA 99 01/30/2020 1354   GFRAA >60 06/11/2013 1836    BNP    Component Value Date/Time   BNP 24.9 11/30/2022 1800    ProBNP    Component Value Date/Time   PROBNP 461 12/12/2022 1358   PROBNP 8.0 05/29/2014 1017     Specialty Problems       Pulmonary Problems   DOE (dyspnea on exertion)    Chest ct 04/2014:  No obvious PE, bibasilar atx, minimal LN Echo 05/2014:  Normal EF R and LHC 05/2014:  Mild cad, normal right heart pressures.  50+ weight gain in one year PFT's 06/2014: FEV1 2.26 (99%), ratio 79, no restriction, mild airtrapping, DLCO normal        PNA (pneumonia)   Acute respiratory failure (HCC)   Dyspnea   OSA on CPAP    Allergies  Allergen Reactions  . Bactrim [Sulfamethoxazole-Trimethoprim] Swelling  . Ranitidine Hcl Anaphylaxis and Other (See Comments)    Has anaphylactic shock--intubated and in ICU x 4 days  . Adalimumab Other (See Comments)  . Etanercept Other (See Comments) and Cough    (ENBREL); Patient thinks it contributed to heart issues  . Etanercept     Other reaction(s): Cough (ALLERGY/intolerance), Other (See Comments) (ENBREL); Patient thinks it contributed to heart issues  . Other Other (See Comments)    Tested allergic  . Sulfa Antibiotics Other (See Comments)  . Tape Itching    Can only tolerate bandages for less than 24-hr periods   . Walnut Other (See Comments)    Tested allergic- Black walnuts    Immunization History  Administered Date(s) Administered  . Influenza-Unspecified 03/12/2014  . PFIZER(Purple Top)SARS-COV-2 Vaccination 06/25/2019, 07/19/2019  . Pneumococcal Conjugate-13 03/12/2013    Past Medical History:  Diagnosis Date  . Arthritis    "hands, back, ankles" (05/11/2014)  . CHF (congestive heart failure) (HCC)   . Chronic lower back pain   . Compression fracture of lumbar vertebra (HCC)   . Coronary artery disease   . Daily headache    "recently" (05/11/2014)  . Depression    "I might be slight" (05/11/2014)  . GERD (gastroesophageal reflux disease)   . High cholesterol   . Hypertension   . Hypothyroid   . OSA treated with BiPAP   . Pneumonia    "couple times; last time was 03/2011" (05/11/2014)  . Type II diabetes  mellitus (HCC)     Tobacco History: Social History   Tobacco Use  Smoking Status Former  . Current packs/day: 0.00  . Average  packs/day: 2.0 packs/day for 30.0 years (60.0 ttl pk-yrs)  . Types: Cigarettes  . Start date: 05/12/1960  . Quit date: 05/12/1990  . Years since quitting: 32.7  Smokeless Tobacco Never   Counseling given: Not Answered   Continue to not smoke  Outpatient Encounter Medications as of 02/12/2023  Medication Sig  . acetaminophen (TYLENOL) 650 MG CR tablet Take 650 mg by mouth every 8 (eight) hours as needed for pain.  Marland Kitchen albuterol (VENTOLIN HFA) 108 (90 Base) MCG/ACT inhaler Inhale 1-2 puffs into the lungs every 4 (four) hours as needed for wheezing or shortness of breath.  Marland Kitchen amoxicillin-clavulanate (AUGMENTIN) 875-125 MG tablet Take 1 tablet by mouth 2 (two) times daily.  Marland Kitchen apixaban (ELIQUIS) 5 MG TABS tablet TAKE 1 TABLET BY MOUTH EVERY 12 HOURS (Patient taking differently: Take 5 mg by mouth every 12 (twelve) hours.)  . Armodafinil 150 MG tablet Take 150 mg by mouth daily.  . BD PEN NEEDLE NANO 2ND GEN 32G X 4 MM MISC   . clobetasol cream (TEMOVATE) 0.05 % Apply 1 Application topically as needed (breakouts on left arm).  . clopidogrel (PLAVIX) 75 MG tablet Take 1 tablet (75 mg total) by mouth daily.  . Continuous Glucose Sensor (FREESTYLE LIBRE 2 SENSOR) MISC Inject 1 Device into the skin every 14 (fourteen) days.  . cyanocobalamin (VITAMIN B12) 500 MCG tablet Take 500 mcg by mouth daily.  . Evolocumab (REPATHA SURECLICK) 140 MG/ML SOAJ Inject 140 mg into the skin every 14 (fourteen) days.  . fenofibrate (TRICOR) 145 MG tablet TAKE 1 TABLET BY MOUTH EVERY DAY (Patient taking differently: Take 145 mg by mouth daily.)  . fluticasone (FLONASE) 50 MCG/ACT nasal spray Place 1 spray into both nostrils 2 (two) times daily.  . folic acid (FOLVITE) 1 MG tablet Take 1 mg by mouth daily.  Marland Kitchen HUMULIN R U-500 KWIKPEN 500 UNIT/ML kwikpen Inject 125-150 Units into the skin 2  (two) times daily.   Marland Kitchen latanoprost (XALATAN) 0.005 % ophthalmic solution Place 1 drop into both eyes in the morning and at bedtime.  Marland Kitchen levothyroxine (SYNTHROID, LEVOTHROID) 100 MCG tablet Take 100 mcg by mouth daily.  Marland Kitchen losartan (COZAAR) 25 MG tablet Take 1 tablet (25 mg total) by mouth daily. Hold if systolic blood pressure (top number) less than 100 mmHg or pulse less than 60 bpm.  . Multiple Vitamins-Minerals (CENTRUM SILVER PO) Take 1 tablet by mouth daily.  . pantoprazole (PROTONIX) 40 MG tablet Take 40 mg by mouth daily.  . pravastatin (PRAVACHOL) 40 MG tablet TAKE 1 TABLET BY MOUTH DAILY AT 6 PM.  . Semaglutide,0.25 or 0.5MG /DOS, (OZEMPIC, 0.25 OR 0.5 MG/DOSE,) 2 MG/3ML SOPN INJECT 0.25 MG INTO THE SKIN ONCE A WEEK FOR 4 DOSES  . sotalol (BETAPACE) 80 MG tablet Take 0.5 tablets (40 mg total) by mouth 2 (two) times daily.  . tamsulosin (FLOMAX) 0.4 MG CAPS capsule Take 0.4 mg by mouth daily after supper.  . Vitamin D, Ergocalciferol, (DRISDOL) 1.25 MG (50000 UNIT) CAPS capsule Take 50,000 Units by mouth once a week.  . methotrexate 2.5 MG tablet Take 2.5 mg by mouth once a week. (Patient not taking: Reported on 01/19/2023)  . nitroGLYCERIN (NITROSTAT) 0.4 MG SL tablet Place 1 tablet (0.4 mg total) under the tongue every 5 (five) minutes as needed for chest pain. If you require more than two tablets five minutes apart go to the nearest ER via EMS.   No facility-administered encounter medications on file as of 02/12/2023.  Review of Systems  Review of Systems  No chest pain exertion.  No orthopnea or PND.  Comprehensive review of systems otherwise negative. Physical Exam  BP 114/75   Pulse 95   Ht 5\' 10"  (1.778 m)   Wt 268 lb 12.8 oz (121.9 kg)   SpO2 95%   BMI 38.57 kg/m   Wt Readings from Last 5 Encounters:  02/12/23 268 lb 12.8 oz (121.9 kg)  01/19/23 272 lb (123.4 kg)  01/14/23 270 lb 8 oz (122.7 kg)  12/16/22 274 lb (124.3 kg)  12/07/22 265 lb 6.9 oz (120.4 kg)    BMI  Readings from Last 5 Encounters:  02/12/23 38.57 kg/m  01/19/23 40.17 kg/m  01/14/23 39.72 kg/m  12/16/22 39.31 kg/m  12/07/22 38.09 kg/m     Physical Exam General: Sitting in chair, no acute distress Eyes: EOMI, no icterus Neck: Supple, no JVP Pulmonary: Distant, clear Cardiovascular: Warm, trace edema Abdomen: Nondistended, Sounds present MSK: No synovitis, no joint effusion Neuro: Normal gait, no weakness Psych: Normal mood, full affect   Assessment & Plan:   Dyspnea on exertion: Suspect multifactorial.  Prior PFTs with reduced FVC, suspect element of restriction possibly related to habitus.  Also with recent STEMI, coronary disease.  Suspect cardiac drivers as well.  No real improvement with Advair.  Trial of Trelegy - if helpful can prescribe.  Asthma felt quite possible, eosinophil mildly elevated in the past as well.   FVC reduction could be related to air trapping otherwise felt more likely related to extraparenchymal restriction, habitus.  Cross-sectional imaging without ILD.  OSA on autotitrating BIPAP: nocturnal dyspnea improved in terms of frequency and severity after PCI for STEMI.  Consider titration study if persist or worsens.  Left ear drainage and pain: Present for couple weeks.  Cerumen impaction on left precludes accurate examination.  Augmentin 1 tab twice daily given reported symptoms and duration of symptoms.   Return in about 3 months (around 05/15/2023) for f/u Dr. Judeth Horn.   Karren Burly, MD 02/12/2023   This appointment required 60 minutes of patient care (this includes precharting, chart review, review of results, face-to-face care, etc.).

## 2023-02-12 NOTE — Progress Notes (Signed)
Daily Session Note  Patient Details  Name: Joshua Lara MRN: [id number] Date of Birth: [birth year] Referring Provider:   Flowsheet Row Cardiac Rehab from 01/14/2023 in [facility] Cardiac and Pulmonary Rehab  Referring Provider [name]       Encounter Date: 02/12/2023  Check In:  Session Check In - 02/12/23 1111       Check-In   Supervising physician immediately available to respond to emergencies See telemetry face sheet for immediately available ER MD    Location [facility]-Cardiac & Pulmonary Rehab    Staff Present Elige Ko, RCP,RRT,BSRT;Meredith Jewel Baize, RN Atilano Median, RN, Silas Flood, BS, Exercise Physiologist    Virtual Visit No    Medication changes reported     No    Fall or balance concerns reported    No    Warm-up and Cool-down Performed on first and last piece of equipment    Resistance Training Performed Yes    VAD Patient? No    PAD/SET Patient? No      Pain Assessment   Currently in Pain? No/denies                Social History   Tobacco Use  Smoking Status Former   Current packs/day: 0.00   Average packs/day: 2.0 packs/day for 30.0 years (60.0 ttl pk-yrs)   Types: Cigarettes   Start date: 05/12/1960   Quit date: 05/12/1990   Years since quitting: 32.7  Smokeless Tobacco Never    Goals Met:  Independence with exercise equipment Exercise tolerated well No report of concerns or symptoms today Strength training completed today  Goals Unmet:  Not Applicable  Comments: Pt able to follow exercise prescription today without complaint.  Will continue to monitor for progression.    Dr. Bethann Punches is Medical Director for [facility] Cardiac Rehabilitation.  Dr. Vida Rigger is Medical Director for [facility] Pulmonary Rehabilitation.

## 2023-02-12 NOTE — Patient Instructions (Signed)
Nice to meet you  Use Trelegy 1 puff once a day, rinse your mouth out with water after every use  Stop using Wixela while using Trelegy  Okay to use albuterol 2 puffs as needed, every 4-6 hours as needed  I sent a prescription for Augmentin 1 tablet twice a day to treat the ear drainage and concern for ear infection  Return to clinic in 3 months or sooner as needed with Dr. Judeth Horn

## 2023-02-17 ENCOUNTER — Encounter: Payer: PPO | Admitting: *Deleted

## 2023-02-17 DIAGNOSIS — Z955 Presence of coronary angioplasty implant and graft: Secondary | ICD-10-CM

## 2023-02-17 DIAGNOSIS — I213 ST elevation (STEMI) myocardial infarction of unspecified site: Secondary | ICD-10-CM | POA: Diagnosis not present

## 2023-02-17 NOTE — Progress Notes (Signed)
Daily Session Note  Patient Details  Name: Joshua Lara MRN: [id number] Date of Birth: [birth year] Referring Provider:   Flowsheet Row Cardiac Rehab from 01/14/2023 in [facility] Cardiac and Pulmonary Rehab  Referring Provider [name]       Encounter Date: 02/17/2023  Check In:  Session Check In - 02/17/23 1131       Check-In   Supervising physician immediately available to respond to emergencies See telemetry face sheet for immediately available ER MD    Location [facility]-Cardiac & Pulmonary Rehab    Staff Present Cora Collum, RN, BSN, CCRP;Jason Wallace Cullens, RDN, LDN;Meredith Jewel Baize, RN BSN;Maxon Conetta BS, , Exercise Physiologist    Virtual Visit No    Medication changes reported     No    Fall or balance concerns reported    No    Warm-up and Cool-down Performed on first and last piece of equipment    Resistance Training Performed Yes    VAD Patient? No    PAD/SET Patient? No      Pain Assessment   Currently in Pain? No/denies                Social History   Tobacco Use  Smoking Status Former   Current packs/day: 0.00   Average packs/day: 2.0 packs/day for 30.0 years (60.0 ttl pk-yrs)   Types: Cigarettes   Start date: 05/12/1960   Quit date: 05/12/1990   Years since quitting: 32.7  Smokeless Tobacco Never    Goals Met:  Independence with exercise equipment Exercise tolerated well No report of concerns or symptoms today  Goals Unmet:  Not Applicable  Comments: Pt able to follow exercise prescription today without complaint.  Will continue to monitor for progression.    Dr. Bethann Punches is Medical Director for [facility] Cardiac Rehabilitation.  Dr. Vida Rigger is Medical Director for [facility] Pulmonary Rehabilitation.

## 2023-02-18 DIAGNOSIS — J432 Centrilobular emphysema: Secondary | ICD-10-CM | POA: Diagnosis not present

## 2023-02-18 DIAGNOSIS — I480 Paroxysmal atrial fibrillation: Secondary | ICD-10-CM | POA: Diagnosis not present

## 2023-02-18 DIAGNOSIS — I2510 Atherosclerotic heart disease of native coronary artery without angina pectoris: Secondary | ICD-10-CM | POA: Diagnosis not present

## 2023-02-18 DIAGNOSIS — J9611 Chronic respiratory failure with hypoxia: Secondary | ICD-10-CM | POA: Diagnosis not present

## 2023-02-18 DIAGNOSIS — E1169 Type 2 diabetes mellitus with other specified complication: Secondary | ICD-10-CM | POA: Diagnosis not present

## 2023-02-19 ENCOUNTER — Encounter: Payer: PPO | Admitting: *Deleted

## 2023-02-19 DIAGNOSIS — Z955 Presence of coronary angioplasty implant and graft: Secondary | ICD-10-CM

## 2023-02-19 DIAGNOSIS — I213 ST elevation (STEMI) myocardial infarction of unspecified site: Secondary | ICD-10-CM | POA: Diagnosis not present

## 2023-02-19 NOTE — Progress Notes (Signed)
Daily Session Note  Patient Details  Name: Joshua Lara MRN: [id number] Date of Birth: 14-Nov-1945 Referring Provider:   Flowsheet Row Cardiac Rehab from 01/14/2023 in [facility] Cardiac and Pulmonary Rehab  Referring Provider [name]       Encounter Date: 02/19/2023  Check In:  Session Check In - 02/19/23 1108       Check-In   Supervising physician immediately available to respond to emergencies See telemetry face sheet for immediately available ER MD    Location [facility]-Cardiac & Pulmonary Rehab    Staff Present Ronette Deter, BS, Exercise Physiologist;Meredith Jewel Baize, RN BSN;Joseph Shelbie Proctor, RN, [place]    Virtual Visit No    Medication changes reported     No    Fall or balance concerns reported    No    Warm-up and Cool-down Performed on first and last piece of equipment    Resistance Training Performed Yes    VAD Patient? No    PAD/SET Patient? No      Pain Assessment   Currently in Pain? No/denies                Social History   Tobacco Use  Smoking Status Former   Current packs/day: 0.00   Average packs/day: 2.0 packs/day for 30.0 years (60.0 ttl pk-yrs)   Types: Cigarettes   Start date: 05/12/1960   Quit date: 05/12/1990   Years since quitting: 32.7  Smokeless Tobacco Never    Goals Met:  Independence with exercise equipment Exercise tolerated well No report of concerns or symptoms today Strength training completed today  Goals Unmet:  Not Applicable  Comments: Pt able to follow exercise prescription today without complaint.  Will continue to monitor for progression.    Dr. Bethann Punches is Medical Director for [facility] Cardiac Rehabilitation.  Dr. Vida Rigger is Medical Director for [facility] Pulmonary Rehabilitation.

## 2023-02-24 ENCOUNTER — Encounter: Payer: PPO | Admitting: *Deleted

## 2023-02-24 DIAGNOSIS — I213 ST elevation (STEMI) myocardial infarction of unspecified site: Secondary | ICD-10-CM

## 2023-02-24 DIAGNOSIS — Z955 Presence of coronary angioplasty implant and graft: Secondary | ICD-10-CM

## 2023-02-24 NOTE — Progress Notes (Signed)
Daily Session Note  Patient Details  Name: Joshua Lara MRN: [id number] Date of Birth: 1946-03-10 Referring Provider:   Flowsheet Row Cardiac Rehab from 01/14/2023 in [facility] Cardiac and Pulmonary Rehab  Referring Provider [name]       Encounter Date: 02/24/2023  Check In:  Session Check In - 02/24/23 1146       Check-In   Supervising physician immediately available to respond to emergencies See telemetry face sheet for immediately available ER MD    Location [facility]-Cardiac & Pulmonary Rehab    Staff Present Cora Collum, RN, BSN, CCRP;Jason Wallace Cullens, RDN, LDN;Meredith Jewel Baize, RN BSN;Maxon Conetta BS, , Exercise Physiologist    Virtual Visit No    Medication changes reported     No    Fall or balance concerns reported    No    Warm-up and Cool-down Performed on first and last piece of equipment    Resistance Training Performed Yes    VAD Patient? No    PAD/SET Patient? No      Pain Assessment   Currently in Pain? No/denies                Social History   Tobacco Use  Smoking Status Former   Current packs/day: 0.00   Average packs/day: 2.0 packs/day for 30.0 years (60.0 ttl pk-yrs)   Types: Cigarettes   Start date: 05/12/1960   Quit date: 05/12/1990   Years since quitting: 32.8  Smokeless Tobacco Never    Goals Met:  Independence with exercise equipment Exercise tolerated well No report of concerns or symptoms today  Goals Unmet:  Not Applicable  Comments: Pt able to follow exercise prescription today without complaint.  Will continue to monitor for progression.    Dr. Bethann Punches is Medical Director for [facility] Cardiac Rehabilitation.  Dr. Vida Rigger is Medical Director for [facility] Pulmonary Rehabilitation.

## 2023-02-25 DIAGNOSIS — H401112 Primary open-angle glaucoma, right eye, moderate stage: Secondary | ICD-10-CM | POA: Diagnosis not present

## 2023-02-25 DIAGNOSIS — H401123 Primary open-angle glaucoma, left eye, severe stage: Secondary | ICD-10-CM | POA: Diagnosis not present

## 2023-02-26 ENCOUNTER — Encounter: Payer: PPO | Admitting: *Deleted

## 2023-03-01 ENCOUNTER — Other Ambulatory Visit: Payer: Self-pay | Admitting: Cardiology

## 2023-03-01 DIAGNOSIS — I5032 Chronic diastolic (congestive) heart failure: Secondary | ICD-10-CM

## 2023-03-04 DIAGNOSIS — M47817 Spondylosis without myelopathy or radiculopathy, lumbosacral region: Secondary | ICD-10-CM | POA: Diagnosis not present

## 2023-03-04 DIAGNOSIS — M62830 Muscle spasm of back: Secondary | ICD-10-CM | POA: Diagnosis not present

## 2023-03-04 DIAGNOSIS — M5416 Radiculopathy, lumbar region: Secondary | ICD-10-CM | POA: Diagnosis not present

## 2023-03-04 DIAGNOSIS — M48061 Spinal stenosis, lumbar region without neurogenic claudication: Secondary | ICD-10-CM | POA: Diagnosis not present

## 2023-03-04 DIAGNOSIS — M5136 Other intervertebral disc degeneration, lumbar region with discogenic back pain only: Secondary | ICD-10-CM | POA: Diagnosis not present

## 2023-03-04 DIAGNOSIS — M47818 Spondylosis without myelopathy or radiculopathy, sacral and sacrococcygeal region: Secondary | ICD-10-CM | POA: Diagnosis not present

## 2023-03-04 DIAGNOSIS — G4733 Obstructive sleep apnea (adult) (pediatric): Secondary | ICD-10-CM | POA: Diagnosis not present

## 2023-03-04 DIAGNOSIS — M47816 Spondylosis without myelopathy or radiculopathy, lumbar region: Secondary | ICD-10-CM | POA: Diagnosis not present

## 2023-03-04 DIAGNOSIS — M545 Low back pain, unspecified: Secondary | ICD-10-CM | POA: Diagnosis not present

## 2023-03-05 ENCOUNTER — Encounter: Payer: PPO | Admitting: *Deleted

## 2023-03-11 ENCOUNTER — Encounter: Payer: Self-pay | Admitting: *Deleted

## 2023-03-11 DIAGNOSIS — J449 Chronic obstructive pulmonary disease, unspecified: Secondary | ICD-10-CM | POA: Diagnosis not present

## 2023-03-11 DIAGNOSIS — I213 ST elevation (STEMI) myocardial infarction of unspecified site: Secondary | ICD-10-CM

## 2023-03-11 DIAGNOSIS — R200 Anesthesia of skin: Secondary | ICD-10-CM | POA: Diagnosis not present

## 2023-03-11 DIAGNOSIS — R202 Paresthesia of skin: Secondary | ICD-10-CM | POA: Diagnosis not present

## 2023-03-11 DIAGNOSIS — M5416 Radiculopathy, lumbar region: Secondary | ICD-10-CM | POA: Diagnosis not present

## 2023-03-11 DIAGNOSIS — Z955 Presence of coronary angioplasty implant and graft: Secondary | ICD-10-CM

## 2023-03-11 NOTE — Progress Notes (Signed)
Cardiac Individual Treatment Plan  Patient Details  Name: Joshua Lara MRN: [id number] Date of Birth: 1945-08-19 Referring Provider:   Flowsheet Row Cardiac Rehab from 01/14/2023 in [facility] Cardiac and Pulmonary Rehab  Referring Provider [name]       Initial Encounter Date:  Flowsheet Row Cardiac Rehab from 01/14/2023 in [facility] Cardiac and Pulmonary Rehab  Date 01/14/23       Visit Diagnosis: ST elevation myocardial infarction (STEMI), unspecified artery [facility])  Status post coronary artery stent placement  Patient's Home Medications on Admission:  Current Outpatient Medications:    acetaminophen (TYLENOL) 650 MG CR tablet, Take 650 mg by mouth every 8 (eight) hours as needed for pain., Disp: , Rfl:    albuterol (VENTOLIN HFA) 108 (90 Base) MCG/ACT inhaler, Inhale 1-2 puffs into the lungs every 4 (four) hours as needed for wheezing or shortness of breath., Disp: , Rfl:    amoxicillin-clavulanate (AUGMENTIN) 875-125 MG tablet, Take 1 tablet by mouth 2 (two) times daily., Disp: 14 tablet, Rfl: 0   apixaban (ELIQUIS) 5 MG TABS tablet, TAKE 1 TABLET BY MOUTH EVERY 12 HOURS (Patient taking differently: Take 5 mg by mouth every 12 (twelve) hours.), Disp: 60 tablet, Rfl: 10   Armodafinil 150 MG tablet, Take 150 mg by mouth daily., Disp: , Rfl:    BD PEN NEEDLE NANO 2ND GEN 32G X 4 MM MISC, , Disp: , Rfl:    clobetasol cream (TEMOVATE) 0.05 %, Apply 1 Application topically as needed (breakouts on left arm)., Disp: , Rfl:    clopidogrel (PLAVIX) 75 MG tablet, Take 1 tablet (75 mg total) by mouth daily., Disp: 90 tablet, Rfl: 1   Continuous Glucose Sensor (FREESTYLE LIBRE 2 SENSOR) MISC, Inject 1 Device into the skin every 14 (fourteen) days., Disp: , Rfl:    cyanocobalamin (VITAMIN B12) 500 MCG tablet, Take 500 mcg by mouth daily., Disp: , Rfl:    Evolocumab (REPATHA SURECLICK) 140 MG/ML SOAJ, Inject 140 mg into the skin every 14 (fourteen) days., Disp: 6 mL, Rfl: 1   fenofibrate (TRICOR) 145  MG tablet, TAKE 1 TABLET BY MOUTH EVERY DAY (Patient taking differently: Take 145 mg by mouth daily.), Disp: 90 tablet, Rfl: 1   fluticasone (FLONASE) 50 MCG/ACT nasal spray, Place 1 spray into both nostrils 2 (two) times daily., Disp: , Rfl:    folic acid (FOLVITE) 1 MG tablet, Take 1 mg by mouth daily., Disp: , Rfl:    HUMULIN R U-500 KWIKPEN 500 UNIT/ML kwikpen, Inject 125-150 Units into the skin 2 (two) times daily. , Disp: , Rfl:    latanoprost (XALATAN) 0.005 % ophthalmic solution, Place 1 drop into both eyes in the morning and at bedtime., Disp: , Rfl:    levothyroxine (SYNTHROID, LEVOTHROID) 100 MCG tablet, Take 100 mcg by mouth daily., Disp: , Rfl:    losartan (COZAAR) 25 MG tablet, TAKE 1 TABLET BY MOUTH EVERY EVENING, Disp: 90 tablet, Rfl: 1   methotrexate 2.5 MG tablet, Take 2.5 mg by mouth once a week. (Patient not taking: Reported on 01/19/2023), Disp: , Rfl:    Multiple Vitamins-Minerals (CENTRUM SILVER PO), Take 1 tablet by mouth daily., Disp: , Rfl:    nitroGLYCERIN (NITROSTAT) 0.4 MG SL tablet, Place 1 tablet (0.4 mg total) under the tongue every 5 (five) minutes as needed for chest pain. If you require more than two tablets five minutes apart go to the nearest ER via EMS., Disp: 30 tablet, Rfl: 0   pantoprazole (PROTONIX) 40 MG tablet,  Take 40 mg by mouth daily., Disp: , Rfl:    pravastatin (PRAVACHOL) 40 MG tablet, TAKE 1 TABLET BY MOUTH DAILY AT 6 PM., Disp: 90 tablet, Rfl: 3   Semaglutide,0.25 or 0.5MG /DOS, (OZEMPIC, 0.25 OR 0.5 MG/DOSE,) 2 MG/3ML SOPN, INJECT 0.25 MG INTO THE SKIN ONCE A WEEK FOR 4 DOSES, Disp: 6 mL, Rfl: 0   sotalol (BETAPACE) 80 MG tablet, Take 0.5 tablets (40 mg total) by mouth 2 (two) times daily., Disp: , Rfl:    tamsulosin (FLOMAX) 0.4 MG CAPS capsule, Take 0.4 mg by mouth daily after supper., Disp: , Rfl:    Vitamin D, Ergocalciferol, (DRISDOL) 1.25 MG (50000 UNIT) CAPS capsule, Take 50,000 Units by mouth once a week., Disp: , Rfl:   Past Medical  History: Past Medical History:  Diagnosis Date   Arthritis    "hands, back, ankles" (05/11/2014)   CHF (congestive heart failure) (HCC)    Chronic lower back pain    Compression fracture of lumbar vertebra (HCC)    Coronary artery disease    Daily headache    "recently" (05/11/2014)   Depression    "I might be slight" (05/11/2014)   GERD (gastroesophageal reflux disease)    High cholesterol    Hypertension    Hypothyroid    OSA treated with BiPAP    Pneumonia    "couple times; last time was 03/2011" (05/11/2014)   Type II diabetes mellitus (HCC)     Tobacco Use: Social History   Tobacco Use  Smoking Status Former   Current packs/day: 0.00   Average packs/day: 2.0 packs/day for 30.0 years (60.0 ttl pk-yrs)   Types: Cigarettes   Start date: 05/12/1960   Quit date: 05/12/1990   Years since quitting: 32.8  Smokeless Tobacco Never    Labs: Review Flowsheet  More data exists      Latest Ref Rng & Units 11/04/2016 12/17/2018 12/06/2022 12/07/2022 12/08/2022  Labs for ITP Cardiac and Pulmonary Rehab  Cholestrol 0 - 200 mg/dL - 657  98  - 93   LDL (calc) 0 - 99 mg/dL - 61  - - 30   Direct LDL 0 - 99 mg/dL - - - 36  35   HDL-C >84 mg/dL - 59  36  - 39   Trlycerides <150 mg/dL - 696  295  - 284   Hemoglobin A1c 4.8 - 5.6 % - 6.4  7.4  7.1  -  PH, Arterial 7.350 - 7.450 7.264  - - - -  PCO2 arterial 32.0 - 48.0 mmHg 48.6  - - - -  Bicarbonate 20.0 - 28.0 mmol/L 22.0  - - - -  TCO2 22 - 32 mmol/L 23  - - 23  -  Acid-base deficit 0.0 - 2.0 mmol/L 5.0  - - - -  O2 Saturation % 96.0  - - - -    Details             Exercise Target Goals: Exercise Program Goal: Individual exercise prescription set using results from initial 6 min walk test and THRR while considering  patient's activity barriers and safety.   Exercise Prescription Goal: Initial exercise prescription builds to 30-45 minutes a day of aerobic activity, 2-3 days per week.  Home exercise guidelines will be given to  patient during program as part of exercise prescription that the participant will acknowledge.   Education: Aerobic Exercise: - Group verbal and visual presentation on the components of exercise prescription. Introduces F.I.T.T principle from ACSM for  exercise prescriptions.  Reviews F.I.T.T. principles of aerobic exercise including progression. Written material given at graduation. Flowsheet Row Cardiac Rehab from 01/14/2023 in [facility] Cardiac and Pulmonary Rehab  Education need identified 01/14/23       Education: Resistance Exercise: - Group verbal and visual presentation on the components of exercise prescription. Introduces F.I.T.T principle from ACSM for exercise prescriptions  Reviews F.I.T.T. principles of resistance exercise including progression. Written material given at graduation.    Education: Exercise & Equipment Safety: - Individual verbal instruction and demonstration of equipment use and safety with use of the equipment. Flowsheet Row Cardiac Rehab from 01/14/2023 in [facility] Cardiac and Pulmonary Rehab  Date 01/14/23  Educator MB  Instruction Review Code 1- Verbalizes Understanding       Education: Exercise Physiology & General Exercise Guidelines: - Group verbal and written instruction with models to review the exercise physiology of the cardiovascular system and associated critical values. Provides general exercise guidelines with specific guidelines to those with heart or lung disease.    Education: Flexibility, Balance, Mind/Body Relaxation: - Group verbal and visual presentation with interactive activity on the components of exercise prescription. Introduces F.I.T.T principle from ACSM for exercise prescriptions. Reviews F.I.T.T. principles of flexibility and balance exercise training including progression. Also discusses the mind body connection.  Reviews various relaxation techniques to help reduce and manage stress (i.e. Deep breathing, progressive muscle relaxation, and  visualization). Balance handout provided to take home. Written material given at graduation.   Activity Barriers & Risk Stratification:  Activity Barriers & Cardiac Risk Stratification - 01/14/23 1544       Activity Barriers & Cardiac Risk Stratification   Activity Barriers Back Problems;Arthritis;Deconditioning;Other (comment)    Comments Fatigue    Cardiac Risk Stratification Moderate             6 Minute Walk:  6 Minute Walk     Row Name 01/14/23 1542         6 Minute Walk   Phase Initial     Distance 530 feet     Walk Time 5.42 minutes     # of Rest Breaks 3     MPH 1.1     METS 0.43     RPE 15     Perceived Dyspnea  2     VO2 Peak 1.52     Symptoms Yes (comment)     Comments Back Pain, Knees     Resting HR 82 bpm     Resting BP 110/64     Resting Oxygen Saturation  95 %     Exercise Oxygen Saturation  during 6 min walk 96 %     Max Ex. HR 108 bpm     Max Ex. BP 112/80     2 Minute Post BP 110/78              Oxygen Initial Assessment:   Oxygen Re-Evaluation:   Oxygen Discharge (Final Oxygen Re-Evaluation):   Initial Exercise Prescription:  Initial Exercise Prescription - 01/14/23 1500       Date of Initial Exercise RX and Referring Provider   Date 01/14/23    Referring Provider [name]      Oxygen   Maintain Oxygen Saturation 88% or higher      Recumbant Bike   Level 1    RPM 50    Watts 15    Minutes 15    METs 0.43      NuStep   Level 1  SPM 80    Minutes 15    METs 0.43      T5 Nustep   Level 1    SPM 80    Minutes 15    METs 0.43      Biostep-RELP   Level 1    SPM 50    Minutes 15    METs 0.43      Track   Laps 5    Minutes 15    METs 1.27      Prescription Details   Frequency (times per week) 2    Duration Progress to 30 minutes of continuous aerobic without signs/symptoms of physical distress      Intensity   THRR 40-80% of Max Heartrate 106-131    Ratings of Perceived Exertion 11-13     Perceived Dyspnea 0-4      Progression   Progression Continue to progress workloads to maintain intensity without signs/symptoms of physical distress.      Resistance Training   Training Prescription Yes    Weight 3 lb    Reps 10-15             Perform Capillary Blood Glucose checks as needed.  Exercise Prescription Changes:   Exercise Prescription Changes     Row Name 01/14/23 1500 01/28/23 1000 02/09/23 0900 02/24/23 1400 03/10/23 1400     Response to Exercise   Blood Pressure (Admit) 110/64 118/64 122/70 126/64 124/74   Blood Pressure (Exercise) 112/80 132/70 160/84 132/66 112/64   Blood Pressure (Exit) 110/78 118/60 126/64 118/88 110/62   Heart Rate (Admit) 82 bpm 101 bpm 104 bpm 98 bpm 100 bpm   Heart Rate (Exercise) 108 bpm 115 bpm 113 bpm 119 bpm 109 bpm   Heart Rate (Exit) 100 bpm 104 bpm 98 bpm 100 bpm 99 bpm   Oxygen Saturation (Admit) 95 % -- -- -- --   Oxygen Saturation (Exercise) 96 % -- -- -- --   Oxygen Saturation (Exit) 96 % -- -- -- --   Rating of Perceived Exertion (Exercise) 15 15 15 15 16    Perceived Dyspnea (Exercise) 2 0 -- -- --   Symptoms Back and knee pain none none none none   Comments results First week of exercise -- -- --   Duration -- Progress to 30 minutes of  aerobic without signs/symptoms of physical distress Progress to 30 minutes of  aerobic without signs/symptoms of physical distress Progress to 30 minutes of  aerobic without signs/symptoms of physical distress Progress to 30 minutes of  aerobic without signs/symptoms of physical distress   Intensity -- THRR unchanged THRR unchanged THRR unchanged THRR unchanged     Progression   Progression Continue to progress workloads to maintain intensity without signs/symptoms of physical distress. Continue to progress workloads to maintain intensity without signs/symptoms of physical distress. Continue to progress workloads to maintain intensity without signs/symptoms of physical distress.  Continue to progress workloads to maintain intensity without signs/symptoms of physical distress. Continue to progress workloads to maintain intensity without signs/symptoms of physical distress.   Average METs 0.43 2 2.3 2.6 2.65     Resistance Training   Training Prescription -- Yes Yes Yes Yes   Weight -- 3 lb 3 lb 5 lb 5 lb   Reps -- 10-15 10-15 10-15 10-15     Interval Training   Interval Training -- No No No No     Recumbant Bike   Level -- 1 2 1.5 --   Watts --  16 19 19  --   Minutes -- 15 15 15  --   METs -- -- 2.49 2.49 --     NuStep   Level -- 2 1 3 3    Minutes -- 15 15 15 15    METs -- 2 1.8 3.2 2.3     REL-XR   Level -- -- -- 1 --   Minutes -- -- -- 15 --   METs -- -- -- 1.8 --     T5 Nustep   Level -- 1 3 -- --   Minutes -- 15 15 -- --   METs -- 2 2 -- --     Biostep-RELP   Level -- -- 1 2 2    Minutes -- -- 15 15 15    METs -- -- -- 3 3     Track   Laps -- -- 31 31 --   Minutes -- -- 15 15 --   METs -- -- 2.69 2.69 --     Oxygen   Maintain Oxygen Saturation -- 88% or higher 88% or higher 88% or higher 88% or higher            Exercise Comments:   Exercise Comments     Row Name 01/27/23 1200           Exercise Comments First full day of exercise!  Patient was oriented to gym and equipment including functions, settings, policies, and procedures.  Patient's individual exercise prescription and treatment plan were reviewed.  All starting workloads were established based on the results of the 6 minute walk test done at initial orientation visit.  The plan for exercise progression was also introduced and progression will be customized based on patient's performance and goals.                Exercise Goals and Review:   Exercise Goals     Row Name 01/14/23 1550             Exercise Goals   Increase Physical Activity Yes       Intervention Provide advice, education, support and counseling about physical activity/exercise needs.;Develop  an individualized exercise prescription for aerobic and resistive training based on initial evaluation findings, risk stratification, comorbidities and participant's personal goals.       Expected Outcomes Short Term: Attend rehab on a regular basis to increase amount of physical activity.;Long Term: Exercising regularly at least 3-5 days a week.;Long Term: Add in home exercise to make exercise part of routine and to increase amount of physical activity.       Increase Strength and Stamina Yes       Intervention Provide advice, education, support and counseling about physical activity/exercise needs.;Develop an individualized exercise prescription for aerobic and resistive training based on initial evaluation findings, risk stratification, comorbidities and participant's personal goals.       Expected Outcomes Short Term: Increase workloads from initial exercise prescription for resistance, speed, and METs.;Long Term: Improve cardiorespiratory fitness, muscular endurance and strength as measured by increased METs and functional capacity ( );Short Term: Perform resistance training exercises routinely during rehab and add in resistance training at home       Able to understand and use rate of perceived exertion (RPE) scale Yes       Intervention Provide education and explanation on how to use RPE scale       Expected Outcomes Short Term: Able to use RPE daily in rehab to express subjective intensity level;Long Term:  Able to use RPE  to guide intensity level when exercising independently       Able to understand and use Dyspnea scale Yes       Intervention Provide education and explanation on how to use Dyspnea scale       Expected Outcomes Short Term: Able to use Dyspnea scale daily in rehab to express subjective sense of shortness of breath during exertion;Long Term: Able to use Dyspnea scale to guide intensity level when exercising independently       Knowledge and understanding of Target Heart Rate  Range (THRR) Yes       Intervention Provide education and explanation of THRR including how the numbers were predicted and where they are located for reference       Expected Outcomes Short Term: Able to state/look up THRR;Long Term: Able to use THRR to govern intensity when exercising independently;Short Term: Able to use daily as guideline for intensity in rehab       Able to check pulse independently Yes       Intervention Provide education and demonstration on how to check pulse in carotid and radial arteries.;Review the importance of being able to check your own pulse for safety during independent exercise       Expected Outcomes Short Term: Able to explain why pulse checking is important during independent exercise;Long Term: Able to check pulse independently and accurately       Understanding of Exercise Prescription Yes       Intervention Provide education, explanation, and written materials on patient's individual exercise prescription       Expected Outcomes Short Term: Able to explain program exercise prescription;Long Term: Able to explain home exercise prescription to exercise independently                Exercise Goals Re-Evaluation :  Exercise Goals Re-Evaluation     Row Name 01/27/23 1200 01/28/23 1035 02/09/23 0939 02/24/23 1455 03/10/23 1503     Exercise Goal Re-Evaluation   Exercise Goals Review Able to understand and use rate of perceived exertion (RPE) scale;Knowledge and understanding of Target Heart Rate Range (THRR);Able to understand and use Dyspnea scale;Understanding of Exercise Prescription Increase Physical Activity;Increase Strength and Stamina;Understanding of Exercise Prescription Increase Physical Activity;Increase Strength and Stamina;Understanding of Exercise Prescription Increase Physical Activity;Increase Strength and Stamina;Understanding of Exercise Prescription Increase Physical Activity;Increase Strength and Stamina;Understanding of Exercise Prescription    Comments Reviewed RPE  and dyspnea scale, THR and program prescription with pt today.  Pt voiced understanding and was given a copy of goals to take home.       Short: Use RPE daily to regulate intensity.  Long: Follow program prescription in THR. Ed is off to a good start in the program. He was able to increase the level on the T4 nustep to 2. He also used both the recumbent bike and T5 nustep on level 1. We will continue to monitor his progress in the program. Link Snuffer is doing well in the program. He recently was able to walk 31 laps on the track, but only walked once since the last review. He also improved to level 3 on the T5 nustep and level 2 on the recumbent bike. He continues to do well with 3 lb hand weights for resistance training as well. We will continue to monitor his progress in the program. Link Snuffer is doing well in rehab. He continues to do well on the track and consistently walking 31 laps. He also improved to level 3 on the T4 nustep  and level 2 on the biostep. He increased to 5 lb hand weights for resistance training as well. We will continue to monitor his progress in the program. Link Snuffer has only attended one session since the last review. During his one session he was able to work at level 3 on the T4 nustep and level 2 on the biostep. We will encourage him to attend rehab more consistently to see the benefits of exercise. We will continue to monitor his progress in the program.   Expected Outcomes Short: Use RPE daily to regulate intensity. Long: Follow program prescription in THR. Short: Continue to follow current exercise prescription, and progressively increase workloads. Long: Continue exercise to improve strength and stamina. Short: Continue to walk the track consistently. Long: Continue exercise to improve strength and stamina. Short: Continue to progressively increase workloads. Long: Continue exercise to improve strength and stamina. Short: Attend rehab more regularly. Long: Continue  exercise to improve strength and stamina.            Discharge Exercise Prescription (Final Exercise Prescription Changes):  Exercise Prescription Changes - 03/10/23 1400       Response to Exercise   Blood Pressure (Admit) 124/74    Blood Pressure (Exercise) 112/64    Blood Pressure (Exit) 110/62    Heart Rate (Admit) 100 bpm    Heart Rate (Exercise) 109 bpm    Heart Rate (Exit) 99 bpm    Rating of Perceived Exertion (Exercise) 16    Symptoms none    Duration Progress to 30 minutes of  aerobic without signs/symptoms of physical distress    Intensity THRR unchanged      Progression   Progression Continue to progress workloads to maintain intensity without signs/symptoms of physical distress.    Average METs 2.65      Resistance Training   Training Prescription Yes    Weight 5 lb    Reps 10-15      Interval Training   Interval Training No      NuStep   Level 3    Minutes 15    METs 2.3      Biostep-RELP   Level 2    Minutes 15    METs 3      Oxygen   Maintain Oxygen Saturation 88% or higher             Nutrition:  Target Goals: Understanding of nutrition guidelines, daily intake of sodium 1500mg , cholesterol 200mg , calories 30% from fat and 7% or less from saturated fats, daily to have 5 or more servings of fruits and vegetables.  Education: All About Nutrition: -Group instruction provided by verbal, written material, interactive activities, discussions, models, and posters to present general guidelines for heart healthy nutrition including fat, fiber, MyPlate, the role of sodium in heart healthy nutrition, utilization of the nutrition label, and utilization of this knowledge for meal planning. Follow up email sent as well. Written material given at graduation. Flowsheet Row Cardiac Rehab from 01/14/2023 in [facility] Cardiac and Pulmonary Rehab  Education need identified 01/14/23       Biometrics:  Pre Biometrics - 01/14/23 1551       Pre Biometrics    Height 5' 9.2" (1.758 m)    Weight 270 lb 8 oz (122.7 kg)    Waist Circumference 52 inches    Hip Circumference 52 inches    Waist to Hip Ratio 1 %    BMI (Calculated) 39.7    Single Leg Stand 4.5 seconds  Nutrition Therapy Plan and Nutrition Goals:  Nutrition Therapy & Goals - 01/20/23 1140       Nutrition Therapy   Diet Carb controlled, cardiac, low Na    Protein (specify units) 90    Fiber 30 grams    Whole Grain Foods 3 servings    Saturated Fats 15 max. grams    Fruits and Vegetables 5 servings/day    Sodium 2 grams      Personal Nutrition Goals   Nutrition Goal Eat a controlled portion of carbs at each meal (30-60g each meal) and pair it with a good protein    Personal Goal #2 Watch sodium intake, aim for less than 2300mg  per day    Personal Goal #3 Cut back on fried foods and high saturated fat items    Comments Patient drinking some water, diet soda, but lately drinking a lot of whole milk, says skim milk has more sugar. Reviewed the labels and talked about how skim milk would still be better than whole milk, due to whole milks high saturated fat content. Recommended he choose reduce or no fat dairy options like cheese, ice cream, yogurt, and milk. Reviewed his 24hr food recall, he is ozempic now for 3 weeks. Asked if he is eating smaller portions now with the medication, he reports he is. Recall, has high calorie, poor quality choices like deli meat, sugary snacks, and fried foods. Spoke with him about changes to make at each meal. Encouraged less fried foods, grilled chicken as a proposed change. Encouraged more veggies at meals to counter balance the high calorie choices and reduce portions of carbs. He struggles to build heart healthy meals as he prefers "country cooking" with lots of sodium, saturated fat, and carb heavy meals. Reviewed mediterranean diet handout to focus on a few areas of change at a time. Built out some example meals with foods he likes and  will eat, focus on more veggies, protein paired with controlled portions of carbs. Set goals to work on making more heart healthy changes. Reminded him that small changes are still changes, and could be built upon later into more large lifestyle changes as he progressed.      Intervention Plan   Intervention Prescribe, educate and counsel regarding individualized specific dietary modifications aiming towards targeted core components such as weight, hypertension, lipid management, diabetes, heart failure and other comorbidities.;Nutrition handout(s) given to patient.    Expected Outcomes Short Term Goal: Understand basic principles of dietary content, such as calories, fat, sodium, cholesterol and nutrients.;Short Term Goal: A plan has been developed with personal nutrition goals set during dietitian appointment.;Long Term Goal: Adherence to prescribed nutrition plan.             Nutrition Assessments:  MEDIFICTS Score Key: >=70 Need to make dietary changes  40-70 Heart Healthy Diet <= 40 Therapeutic Level Cholesterol Diet  Flowsheet Row Cardiac Rehab from 01/14/2023 in [facility] Cardiac and Pulmonary Rehab  Picture Your Plate Total Score on Admission 49      Picture Your Plate Scores: <40 Unhealthy dietary pattern with much room for improvement. 41-50 Dietary pattern unlikely to meet recommendations for good health and room for improvement. 51-60 More healthful dietary pattern, with some room for improvement.  >60 Healthy dietary pattern, although there may be some specific behaviors that could be improved.    Nutrition Goals Re-Evaluation:  Nutrition Goals Re-Evaluation     Row Name 02/10/23 1344  Goals   Comment Link Snuffer has been tyring to incoporate what he learned from his RD appt into his normal routine. He has noticed that since starting ozempic, he finds himself not eating his usual sized portions. He brings snacks with him in case his sugar gets low. He wants to try  to continue working on his sodium and protein intake       Expected Outcome Short: read food labels and plan meals out for the day. Long: independently manage a heart healthy diet.                Nutrition Goals Discharge (Final Nutrition Goals Re-Evaluation):  Nutrition Goals Re-Evaluation - 02/10/23 1344       Goals   Comment Eddie has been tyring to incoporate what he learned from his RD appt into his normal routine. He has noticed that since starting ozempic, he finds himself not eating his usual sized portions. He brings snacks with him in case his sugar gets low. He wants to try to continue working on his sodium and protein intake    Expected Outcome Short: read food labels and plan meals out for the day. Long: independently manage a heart healthy diet.             Psychosocial: Target Goals: Acknowledge presence or absence of significant depression and/or stress, maximize coping skills, provide positive support system. Participant is able to verbalize types and ability to use techniques and skills needed for reducing stress and depression.   Education: Stress, Anxiety, and Depression - Group verbal and visual presentation to define topics covered.  Reviews how body is impacted by stress, anxiety, and depression.  Also discusses healthy ways to reduce stress and to treat/manage anxiety and depression.  Written material given at graduation.   Education: Sleep Hygiene -Provides group verbal and written instruction about how sleep can affect your health.  Define sleep hygiene, discuss sleep cycles and impact of sleep habits. Review good sleep hygiene tips.    Initial Review & Psychosocial Screening:  Initial Psych Review & Screening - 12/31/22 1338       Initial Review   Current issues with None Identified      Family Dynamics   Good Support System? Yes   children     Barriers   Psychosocial barriers to participate in program There are no identifiable barriers or  psychosocial needs.      Screening Interventions   Interventions Encouraged to exercise;To provide support and resources with identified psychosocial needs;Provide feedback about the scores to participant    Expected Outcomes Short Term goal: Utilizing psychosocial counselor, staff and physician to assist with identification of specific Stressors or current issues interfering with healing process. Setting desired goal for each stressor or current issue identified.;Long Term Goal: Stressors or current issues are controlled or eliminated.;Short Term goal: Identification and review with participant of any Quality of Life or Depression concerns found by scoring the questionnaire.;Long Term goal: The participant improves quality of Life and PHQ9 Scores as seen by post scores and/or verbalization of changes             Quality of Life Scores:   Quality of Life - 01/14/23 1552       Quality of Life   Select Quality of Life      Quality of Life Scores   Health/Function Pre 9.97 %    Socioeconomic Pre 19.93 %    Psych/Spiritual Pre 15.08 %    Family Pre 16.5 %  GLOBAL Pre 14 %            Scores of 19 and below usually indicate a poorer quality of life in these areas.  A difference of  2-3 points is a clinically meaningful difference.  A difference of 2-3 points in the total score of the Quality of Life Index has been associated with significant improvement in overall quality of life, self-image, physical symptoms, and general health in studies assessing change in quality of life.  PHQ-9: Review Flowsheet       01/14/2023 08/23/2018 01/14/2018 07/12/2015  Depression screen PHQ 2/9  Decreased Interest 2 3 1 1   Down, Depressed, Hopeless 3 3 1  0  PHQ - 2 Score 5 6 2 1   Altered sleeping 2 - - -  Tired, decreased energy 3 - - -  Change in appetite 3 - - -  Feeling bad or failure about yourself  1 - - -  Trouble concentrating 2 - - -  Moving slowly or fidgety/restless 0 - - -  Suicidal  thoughts 1 - - -  PHQ-9 Score 17 - - -  Difficult doing work/chores Somewhat difficult - - -    Details           Interpretation of Total Score  Total Score Depression Severity:  1-4 = Minimal depression, 5-9 = Mild depression, 10-14 = Moderate depression, 15-19 = Moderately severe depression, 20-27 = Severe depression   Psychosocial Evaluation and Intervention:  Psychosocial Evaluation - 12/31/22 1351       Psychosocial Evaluation & Interventions   Interventions Encouraged to exercise with the program and follow exercise prescription    Comments Ediie has no bariers to attending the program. He is recently widowed (5 years). HIs children are his support.  His daughter,that works at the hospital) helps him keep his medications straight.  He wants to get back to a healthier exercie routine and work on losing about 60 lbs. 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  He does not feel that he has any stress or depression.    Expected Outcomes STG Eddie attends all scheduled sessions, works on exercise and nutrition goals. LTG Link Snuffer continues to work on exercise and nutrition goals after discharge. USing tools and resources he learned about in program    Continue Psychosocial Services  Follow up required by staff             Psychosocial Re-Evaluation:  Psychosocial Re-Evaluation     Row Name 02/10/23 1341             Psychosocial Re-Evaluation   Current issues with Current Stress Concerns       Comments Link Snuffer reports his current stress concern is financial. He had to pay a lot of money to the government over the last few months. His fridge also gave out this past weekend and he had to pay to replace it. 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 He switched to weekends because it isn't as busy as during the week. He is trying to get up and move more when he is at home and at work, so he is setting an alarm almost every hour  to remind himself to get up and take some laps. He enjoys the program so far and is looking forward to continuing to attend. He is grateful for his kids and support system for helping look after him and he is  motivated to keep his independence.       Expected Outcomes Short: continue coming to cardiac rehab consistently. Long: graduate from the program.       Interventions Encouraged to attend Cardiac Rehabilitation for the exercise       Continue Psychosocial Services  Follow up required by staff                Psychosocial Discharge (Final Psychosocial Re-Evaluation):  Psychosocial Re-Evaluation - 02/10/23 1341       Psychosocial Re-Evaluation   Current issues with Current Stress Concerns    Comments Link Snuffer reports his current stress concern is financial. He had to pay a lot of money to the government over the last few months. His fridge also gave out this past weekend and he had to pay to replace it. 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 He switched to weekends because it isn't as busy as during the week. He is trying to get up and move more when he is at home and at work, so he is setting an alarm almost every hour to remind himself to get up and take some laps. He enjoys the program so far and is looking forward to continuing to attend. He is grateful for his kids and support system for helping look after him and he is motivated to keep his independence.    Expected Outcomes Short: continue coming to cardiac rehab consistently. Long: graduate from the program.    Interventions Encouraged to attend Cardiac Rehabilitation for the exercise    Continue Psychosocial Services  Follow up required by staff             Vocational Rehabilitation: Provide vocational rehab assistance to qualifying candidates.   Vocational Rehab Evaluation & Intervention:  Vocational Rehab - 12/31/22 1343       Initial Vocational Rehab Evaluation & Intervention    Assessment shows need for Vocational Rehabilitation No      Vocational Rehab Re-Evaulation   Comments does work part time on weekend             Education: Education Goals: Education classes will be provided on a variety of topics geared toward better understanding of heart health and risk factor modification. Participant will state understanding/return demonstration of topics presented as noted by education test scores.  Learning Barriers/Preferences:   General Cardiac Education Topics:  AED/CPR: - Group verbal and written instruction with the use of models to demonstrate the basic use of the AED with the basic ABC's of resuscitation.   Anatomy and Cardiac Procedures: - Group verbal and visual presentation and models provide information about basic cardiac anatomy and function. Reviews the testing methods done to diagnose heart disease and the outcomes of the test results. Describes the treatment choices: Medical Management, Angioplasty, or Coronary Bypass Surgery for treating various heart conditions including Myocardial Infarction, Angina, Valve Disease, and Cardiac Arrhythmias.  Written material given at graduation. Flowsheet Row Cardiac Rehab from 01/14/2023 in [facility] Cardiac and Pulmonary Rehab  Education need identified 01/14/23       Medication Safety: - Group verbal and visual instruction to review commonly prescribed medications for heart and lung disease. Reviews the medication, class of the drug, and side effects. Includes the steps to properly store meds and maintain the prescription regimen.  Written material given at graduation.   Intimacy: - Group verbal instruction through game format to discuss how heart and lung disease can affect sexual intimacy.  Written material given at graduation..   Know Your Numbers and Heart Failure: - Group verbal and visual instruction to discuss disease risk factors for cardiac and pulmonary disease and treatment options.  Reviews  associated critical values for Overweight/Obesity, Hypertension, Cholesterol, and Diabetes.  Discusses basics of heart failure: signs/symptoms and treatments.  Introduces Heart Failure Zone chart for action plan for heart failure.  Written material given at graduation. Flowsheet Row Cardiac Rehab from 01/14/2023 in [facility] Cardiac and Pulmonary Rehab  Education need identified 01/14/23       Infection Prevention: - Provides verbal and written material to individual with discussion of infection control including proper hand washing and proper equipment cleaning during exercise session. Flowsheet Row Cardiac Rehab from 01/14/2023 in [facility] Cardiac and Pulmonary Rehab  Date 01/14/23  Educator MB  Instruction Review Code 1- Verbalizes Understanding       Falls Prevention: - Provides verbal and written material to individual with discussion of falls prevention and safety. Flowsheet Row Cardiac Rehab from 01/14/2023 in [facility] Cardiac and Pulmonary Rehab  Date 01/14/23  Educator MB  Instruction Review Code 1- Verbalizes Understanding       Other: -Provides group and verbal instruction on various topics (see comments)   Knowledge Questionnaire Score:  Knowledge Questionnaire Score - 01/14/23 1555       Knowledge Questionnaire Score   Pre Score 20/26             Core Components/Risk Factors/Patient Goals at Admission:  Personal Goals and Risk Factors at Admission - 01/14/23 1555       Core Components/Risk Factors/Patient Goals on Admission    Weight Management Yes;Weight Loss    Intervention Weight Management: Develop a combined nutrition and exercise program designed to reach desired caloric intake, while maintaining appropriate intake of nutrient and fiber, sodium and fats, and appropriate energy expenditure required for the weight goal.;Weight Management: Provide education and appropriate resources to help participant work on and attain dietary goals.;Weight Management/Obesity:  Establish reasonable short term and long term weight goals.    Admit Weight 262 lb (118.8 kg)    Goal Weight: Short Term 260 lb (117.9 kg)    Goal Weight: Long Term 200 lb (90.7 kg)    Expected Outcomes Short Term: Continue to assess and modify interventions until short term weight is achieved;Long Term: Adherence to nutrition and physical activity/exercise program aimed toward attainment of established weight goal;Weight Loss: Understanding of general recommendations for a balanced deficit meal plan, which promotes 1-2 lb weight loss per week and includes a negative energy balance of [phone] kcal/d;Understanding recommendations for meals to include 15-35% energy as protein, 25-35% energy from fat, 35-60% energy from carbohydrates, less than 200mg  of dietary cholesterol, 20-35 gm of total fiber daily;Understanding of distribution of calorie intake throughout the day with the consumption of 4-5 meals/snacks    Diabetes Yes    Intervention Provide education about signs/symptoms and action to take for hypo/hyperglycemia.;Provide education about proper nutrition, including hydration, and aerobic/resistive exercise prescription along with prescribed medications to achieve blood glucose in normal ranges: Fasting glucose 65-99 mg/dL    Expected Outcomes Short Term: Participant verbalizes understanding of the signs/symptoms and immediate care of hyper/hypoglycemia, proper foot care and importance of medication, aerobic/resistive exercise and nutrition plan for blood glucose control.;Long Term: Attainment of HbA1C < 7%.    Hypertension Yes    Intervention Provide education on lifestyle modifcations including regular physical activity/exercise, weight management, moderate sodium restriction and increased consumption of fresh fruit, vegetables, and  low fat dairy, alcohol moderation, and smoking cessation.;Monitor prescription use compliance.    Expected Outcomes Short Term: Continued assessment and intervention  until BP is < 140/37mm HG in hypertensive participants. < 130/32mm HG in hypertensive participants with diabetes, heart failure or chronic kidney disease.;Long Term: Maintenance of blood pressure at goal levels.    Lipids Yes    Intervention Provide education and support for participant on nutrition & aerobic/resistive exercise along with prescribed medications to achieve LDL 70mg , HDL >40mg .    Expected Outcomes Short Term: Participant states understanding of desired cholesterol values and is compliant with medications prescribed. Participant is following exercise prescription and nutrition guidelines.;Long Term: Cholesterol controlled with medications as prescribed, with individualized exercise RX and with personalized nutrition plan. Value goals: LDL < 70mg , HDL > 40 mg.             Education:Diabetes - Individual verbal and written instruction to review signs/symptoms of diabetes, desired ranges of glucose level fasting, after meals and with exercise. Acknowledge that pre and post exercise glucose checks will be done for 3 sessions at entry of program. Flowsheet Row Cardiac Rehab from 01/14/2023 in [facility] Cardiac and Pulmonary Rehab  Date 01/14/23  Educator MB  Instruction Review Code 1- Verbalizes Understanding       Core Components/Risk Factors/Patient Goals Review:   Goals and Risk Factor Review     Row Name 02/10/23 1249             Core Components/Risk Factors/Patient Goals Review   Personal Goals Review Diabetes;Hypertension;Lipids       Review Link Snuffer has enjoyed the program the few weeks he has been here. He feels encouraged to start being more mobile. He recently read an article about how important movement is so he has been setting an alarm on his phone to take a couple laps around the house every hour. He can tell he is already starting to feel better between more exercise in the program and paying more attention to what he eats. He has noticed his sugar has been lower at  times than usual, so he is using his libra monitor and paying closer attention to his how many units of insulin he is giving himself. He also started ozempic recently so he is aware of how this will change his eating habits and CBG readings. He using his Repatha correctly and states he is trying to monitor his cholesterol and wants to keep exercising to help.       Expected Outcomes Short: attend cardiac rehab for education on cholesterol management and continue closely monitoring his CBG readings. Long: independently manage risk factors.                Core Components/Risk Factors/Patient Goals at Discharge (Final Review):   Goals and Risk Factor Review - 02/10/23 1249       Core Components/Risk Factors/Patient Goals Review   Personal Goals Review Diabetes;Hypertension;Lipids    Review Link Snuffer has enjoyed the program the few weeks he has been here. He feels encouraged to start being more mobile. He recently read an article about how important movement is so he has been setting an alarm on his phone to take a couple laps around the house every hour. He can tell he is already starting to feel better between more exercise in the program and paying more attention to what he eats. He has noticed his sugar has been lower at times than usual, so he is using his libra monitor and paying closer  attention to his how many units of insulin he is giving himself. He also started ozempic recently so he is aware of how this will change his eating habits and CBG readings. He using his Repatha correctly and states he is trying to monitor his cholesterol and wants to keep exercising to help.    Expected Outcomes Short: attend cardiac rehab for education on cholesterol management and continue closely monitoring his CBG readings. Long: independently manage risk factors.             ITP Comments:  ITP Comments     Row Name 12/31/22 1356 01/14/23 1541 01/27/23 1159 02/11/23 1225 03/11/23 1416   ITP Comments  Virtual orientation call completed today. he has an appointment on Date: 01/14/2023  for EP eval and gym Orientation.  Documentation of diagnosis can be found in [facility] 12/06/2022 . Completed and gym orientation. Initial ITP created and sent for review to Bethann Punches, Medical Director. First full day of exercise!  Patient was oriented to gym and equipment including functions, settings, policies, and procedures.  Patient's individual exercise prescription and treatment plan were reviewed.  All starting workloads were established based on the results of the 6 minute walk test done at initial orientation visit.  The plan for exercise progression was also introduced and progression will be customized based on patient's performance and goals. 30 Day review completed. Medical Director ITP review done, changes made as directed, and signed approval by Medical Director.    new to program 30 Day review completed. Medical Director ITP review done, changes made as directed, and signed approval by Medical Director.            Comments:

## 2023-03-13 ENCOUNTER — Ambulatory Visit: Payer: PPO | Admitting: Cardiology

## 2023-03-18 ENCOUNTER — Telehealth: Payer: Self-pay | Admitting: *Deleted

## 2023-03-18 ENCOUNTER — Encounter: Payer: Self-pay | Admitting: *Deleted

## 2023-03-18 DIAGNOSIS — M5416 Radiculopathy, lumbar region: Secondary | ICD-10-CM | POA: Diagnosis not present

## 2023-03-18 NOTE — Telephone Encounter (Signed)
Ed has been placed on medical hold while he sees a back specialist. He has been calling us to keep [place] informed. Ed will let us know after he sees specialist for the plan moving forward.

## 2023-03-27 DIAGNOSIS — E1142 Type 2 diabetes mellitus with diabetic polyneuropathy: Secondary | ICD-10-CM | POA: Diagnosis not present

## 2023-03-27 DIAGNOSIS — E1136 Type 2 diabetes mellitus with diabetic cataract: Secondary | ICD-10-CM | POA: Diagnosis not present

## 2023-03-27 DIAGNOSIS — H409 Unspecified glaucoma: Secondary | ICD-10-CM | POA: Diagnosis not present

## 2023-03-27 DIAGNOSIS — E039 Hypothyroidism, unspecified: Secondary | ICD-10-CM | POA: Diagnosis not present

## 2023-03-27 DIAGNOSIS — Z794 Long term (current) use of insulin: Secondary | ICD-10-CM | POA: Diagnosis not present

## 2023-03-30 ENCOUNTER — Encounter: Payer: Self-pay | Admitting: *Deleted

## 2023-03-30 NOTE — Telephone Encounter (Signed)
Called to check on pt. He is still on medical hold while seeing back specialist. No answer at this time. Will try to reach pt at a later time.

## 2023-03-31 ENCOUNTER — Encounter: Payer: Self-pay | Admitting: *Deleted

## 2023-03-31 ENCOUNTER — Encounter: Payer: PPO | Attending: Cardiology

## 2023-03-31 DIAGNOSIS — Z955 Presence of coronary angioplasty implant and graft: Secondary | ICD-10-CM | POA: Insufficient documentation

## 2023-03-31 DIAGNOSIS — I213 ST elevation (STEMI) myocardial infarction of unspecified site: Secondary | ICD-10-CM | POA: Insufficient documentation

## 2023-04-01 ENCOUNTER — Encounter: Payer: Self-pay | Admitting: *Deleted

## 2023-04-01 ENCOUNTER — Other Ambulatory Visit: Payer: Self-pay | Admitting: Neurological Surgery

## 2023-04-01 DIAGNOSIS — I213 ST elevation (STEMI) myocardial infarction of unspecified site: Secondary | ICD-10-CM

## 2023-04-01 DIAGNOSIS — Z955 Presence of coronary angioplasty implant and graft: Secondary | ICD-10-CM

## 2023-04-01 DIAGNOSIS — M5416 Radiculopathy, lumbar region: Secondary | ICD-10-CM

## 2023-04-01 NOTE — Progress Notes (Signed)
Cardiac Individual Treatment Plan  Patient Details  Name: Joshua Lara MRN: [id number] Date of Birth: [birth year] Referring Provider:   Flowsheet Row Cardiac Rehab from 01/14/2023 in [facility] Cardiac and Pulmonary Rehab  Referring Provider [name]       Initial Encounter Date:  Flowsheet Row Cardiac Rehab from 01/14/2023 in [facility] Cardiac and Pulmonary Rehab  Date 01/14/23       Visit Diagnosis: ST elevation myocardial infarction (STEMI), unspecified artery [facility])  Status post coronary artery stent placement  Patient's Home Medications on Admission:  Current Outpatient Medications:    acetaminophen (TYLENOL) 650 MG CR tablet, Take 650 mg by mouth every 8 (eight) hours as needed for pain., Disp: , Rfl:    albuterol (VENTOLIN HFA) 108 (90 Base) MCG/ACT inhaler, Inhale 1-2 puffs into the lungs every 4 (four) hours as needed for wheezing or shortness of breath., Disp: , Rfl:    amoxicillin-clavulanate (AUGMENTIN) 875-125 MG tablet, Take 1 tablet by mouth 2 (two) times daily., Disp: 14 tablet, Rfl: 0   apixaban (ELIQUIS) 5 MG TABS tablet, TAKE 1 TABLET BY MOUTH EVERY 12 HOURS (Patient taking differently: Take 5 mg by mouth every 12 (twelve) hours.), Disp: 60 tablet, Rfl: 10   Armodafinil 150 MG tablet, Take 150 mg by mouth daily., Disp: , Rfl:    BD PEN NEEDLE NANO 2ND GEN 32G X 4 MM MISC, , Disp: , Rfl:    clobetasol cream (TEMOVATE) 0.05 %, Apply 1 Application topically as needed (breakouts on left arm)., Disp: , Rfl:    clopidogrel (PLAVIX) 75 MG tablet, Take 1 tablet (75 mg total) by mouth daily., Disp: 90 tablet, Rfl: 1   Continuous Glucose Sensor (FREESTYLE LIBRE 2 SENSOR) MISC, Inject 1 Device into the skin every 14 (fourteen) days., Disp: , Rfl:    cyanocobalamin (VITAMIN B12) 500 MCG tablet, Take 500 mcg by mouth daily., Disp: , Rfl:    Evolocumab (REPATHA SURECLICK) 140 MG/ML SOAJ, Inject 140 mg into the skin every 14 (fourteen) days., Disp: 6 mL, Rfl: 1   fenofibrate (TRICOR) 145  MG tablet, TAKE 1 TABLET BY MOUTH EVERY DAY (Patient taking differently: Take 145 mg by mouth daily.), Disp: 90 tablet, Rfl: 1   fluticasone (FLONASE) 50 MCG/ACT nasal spray, Place 1 spray into both nostrils 2 (two) times daily., Disp: , Rfl:    folic acid (FOLVITE) 1 MG tablet, Take 1 mg by mouth daily., Disp: , Rfl:    HUMULIN R U-500 KWIKPEN 500 UNIT/ML kwikpen, Inject 125-150 Units into the skin 2 (two) times daily. , Disp: , Rfl:    latanoprost (XALATAN) 0.005 % ophthalmic solution, Place 1 drop into both eyes in the morning and at bedtime., Disp: , Rfl:    levothyroxine (SYNTHROID, LEVOTHROID) 100 MCG tablet, Take 100 mcg by mouth daily., Disp: , Rfl:    losartan (COZAAR) 25 MG tablet, TAKE 1 TABLET BY MOUTH EVERY EVENING, Disp: 90 tablet, Rfl: 1   methotrexate 2.5 MG tablet, Take 2.5 mg by mouth once a week. (Patient not taking: Reported on 01/19/2023), Disp: , Rfl:    Multiple Vitamins-Minerals (CENTRUM SILVER PO), Take 1 tablet by mouth daily., Disp: , Rfl:    nitroGLYCERIN (NITROSTAT) 0.4 MG SL tablet, Place 1 tablet (0.4 mg total) under the tongue every 5 (five) minutes as needed for chest pain. If you require more than two tablets five minutes apart go to the nearest ER via EMS., Disp: 30 tablet, Rfl: 0   pantoprazole (PROTONIX) 40 MG tablet,  Take 40 mg by mouth daily., Disp: , Rfl:    pravastatin (PRAVACHOL) 40 MG tablet, TAKE 1 TABLET BY MOUTH DAILY AT 6 PM., Disp: 90 tablet, Rfl: 3   Semaglutide,0.25 or 0.5MG /DOS, (OZEMPIC, 0.25 OR 0.5 MG/DOSE,) 2 MG/3ML SOPN, INJECT 0.25 MG INTO THE SKIN ONCE A WEEK FOR 4 DOSES, Disp: 6 mL, Rfl: 0   sotalol (BETAPACE) 80 MG tablet, Take 0.5 tablets (40 mg total) by mouth 2 (two) times daily., Disp: , Rfl:    tamsulosin (FLOMAX) 0.4 MG CAPS capsule, Take 0.4 mg by mouth daily after supper., Disp: , Rfl:    Vitamin D, Ergocalciferol, (DRISDOL) 1.25 MG (50000 UNIT) CAPS capsule, Take 50,000 Units by mouth once a week., Disp: , Rfl:   Past Medical  History: Past Medical History:  Diagnosis Date   Arthritis    "hands, back, ankles" (05/11/2014)   CHF (congestive heart failure) (HCC)    Chronic lower back pain    Compression fracture of lumbar vertebra (HCC)    Coronary artery disease    Daily headache    "recently" (05/11/2014)   Depression    "I might be slight" (05/11/2014)   GERD (gastroesophageal reflux disease)    High cholesterol    Hypertension    Hypothyroid    OSA treated with BiPAP    Pneumonia    "couple times; last time was 03/2011" (05/11/2014)   Type II diabetes mellitus (HCC)     Tobacco Use: Social History   Tobacco Use  Smoking Status Former   Current packs/day: 0.00   Average packs/day: 2.0 packs/day for 30.0 years (60.0 ttl pk-yrs)   Types: Cigarettes   Start date: 05/12/1960   Quit date: 05/12/1990   Years since quitting: 32.9  Smokeless Tobacco Never    Labs: Review Flowsheet  More data exists      Latest Ref Rng & Units 11/04/2016 12/17/2018 12/06/2022 12/07/2022 12/08/2022  Labs for ITP Cardiac and Pulmonary Rehab  Cholestrol 0 - 200 mg/dL - 621  98  - 93   LDL (calc) 0 - 99 mg/dL - 61  - - 30   Direct LDL 0 - 99 mg/dL - - - 36  35   HDL-C >30 mg/dL - 59  36  - 39   Trlycerides <150 mg/dL - 865  784  - 696   Hemoglobin A1c 4.8 - 5.6 % - 6.4  7.4  7.1  -  PH, Arterial 7.350 - 7.450 7.264  - - - -  PCO2 arterial 32.0 - 48.0 mmHg 48.6  - - - -  Bicarbonate 20.0 - 28.0 mmol/L 22.0  - - - -  TCO2 22 - 32 mmol/L 23  - - 23  -  Acid-base deficit 0.0 - 2.0 mmol/L 5.0  - - - -  O2 Saturation % 96.0  - - - -    Details             Exercise Target Goals: Exercise Program Goal: Individual exercise prescription set using results from initial 6 min walk test and THRR while considering  patient's activity barriers and safety.   Exercise Prescription Goal: Initial exercise prescription builds to 30-45 minutes a day of aerobic activity, 2-3 days per week.  Home exercise guidelines will be given to  patient during program as part of exercise prescription that the participant will acknowledge.   Education: Aerobic Exercise: - Group verbal and visual presentation on the components of exercise prescription. Introduces F.I.T.T principle from ACSM for  exercise prescriptions.  Reviews F.I.T.T. principles of aerobic exercise including progression. Written material given at graduation. Flowsheet Row Cardiac Rehab from 01/14/2023 in [facility] Cardiac and Pulmonary Rehab  Education need identified 01/14/23       Education: Resistance Exercise: - Group verbal and visual presentation on the components of exercise prescription. Introduces F.I.T.T principle from ACSM for exercise prescriptions  Reviews F.I.T.T. principles of resistance exercise including progression. Written material given at graduation.    Education: Exercise & Equipment Safety: - Individual verbal instruction and demonstration of equipment use and safety with use of the equipment. Flowsheet Row Cardiac Rehab from 01/14/2023 in [facility] Cardiac and Pulmonary Rehab  Date 01/14/23  Educator MB  Instruction Review Code 1- Verbalizes Understanding       Education: Exercise Physiology & General Exercise Guidelines: - Group verbal and written instruction with models to review the exercise physiology of the cardiovascular system and associated critical values. Provides general exercise guidelines with specific guidelines to those with heart or lung disease.    Education: Flexibility, Balance, Mind/Body Relaxation: - Group verbal and visual presentation with interactive activity on the components of exercise prescription. Introduces F.I.T.T principle from ACSM for exercise prescriptions. Reviews F.I.T.T. principles of flexibility and balance exercise training including progression. Also discusses the mind body connection.  Reviews various relaxation techniques to help reduce and manage stress (i.e. Deep breathing, progressive muscle relaxation, and  visualization). Balance handout provided to take home. Written material given at graduation.   Activity Barriers & Risk Stratification:  Activity Barriers & Cardiac Risk Stratification - 01/14/23 1544       Activity Barriers & Cardiac Risk Stratification   Activity Barriers Back Problems;Arthritis;Deconditioning;Other (comment)    Comments Fatigue    Cardiac Risk Stratification Moderate             6 Minute Walk:  6 Minute Walk     Row Name 01/14/23 1542         6 Minute Walk   Phase Initial     Distance 530 feet     Walk Time 5.42 minutes     # of Rest Breaks 3     MPH 1.1     METS 0.43     RPE 15     Perceived Dyspnea  2     VO2 Peak 1.52     Symptoms Yes (comment)     Comments Back Pain, Knees     Resting HR 82 bpm     Resting BP 110/64     Resting Oxygen Saturation  95 %     Exercise Oxygen Saturation  during 6 min walk 96 %     Max Ex. HR 108 bpm     Max Ex. BP 112/80     2 Minute Post BP 110/78              Oxygen Initial Assessment:   Oxygen Re-Evaluation:   Oxygen Discharge (Final Oxygen Re-Evaluation):   Initial Exercise Prescription:  Initial Exercise Prescription - 01/14/23 1500       Date of Initial Exercise RX and Referring Provider   Date 01/14/23    Referring Provider [name]      Oxygen   Maintain Oxygen Saturation 88% or higher      Recumbant Bike   Level 1    RPM 50    Watts 15    Minutes 15    METs 0.43      NuStep   Level 1  SPM 80    Minutes 15    METs 0.43      T5 Nustep   Level 1    SPM 80    Minutes 15    METs 0.43      Biostep-RELP   Level 1    SPM 50    Minutes 15    METs 0.43      Track   Laps 5    Minutes 15    METs 1.27      Prescription Details   Frequency (times per week) 2    Duration Progress to 30 minutes of continuous aerobic without signs/symptoms of physical distress      Intensity   THRR 40-80% of Max Heartrate 106-131    Ratings of Perceived Exertion 11-13     Perceived Dyspnea 0-4      Progression   Progression Continue to progress workloads to maintain intensity without signs/symptoms of physical distress.      Resistance Training   Training Prescription Yes    Weight 3 lb    Reps 10-15             Perform Capillary Blood Glucose checks as needed.  Exercise Prescription Changes:   Exercise Prescription Changes     Row Name 01/14/23 1500 01/28/23 1000 02/09/23 0900 02/24/23 1400 03/10/23 1400     Response to Exercise   Blood Pressure (Admit) 110/64 118/64 122/70 126/64 124/74   Blood Pressure (Exercise) 112/80 132/70 160/84 132/66 112/64   Blood Pressure (Exit) 110/78 118/60 126/64 118/88 110/62   Heart Rate (Admit) 82 bpm 101 bpm 104 bpm 98 bpm 100 bpm   Heart Rate (Exercise) 108 bpm 115 bpm 113 bpm 119 bpm 109 bpm   Heart Rate (Exit) 100 bpm 104 bpm 98 bpm 100 bpm 99 bpm   Oxygen Saturation (Admit) 95 % -- -- -- --   Oxygen Saturation (Exercise) 96 % -- -- -- --   Oxygen Saturation (Exit) 96 % -- -- -- --   Rating of Perceived Exertion (Exercise) 15 15 15 15 16    Perceived Dyspnea (Exercise) 2 0 -- -- --   Symptoms Back and knee pain none none none none   Comments results First week of exercise -- -- --   Duration -- Progress to 30 minutes of  aerobic without signs/symptoms of physical distress Progress to 30 minutes of  aerobic without signs/symptoms of physical distress Progress to 30 minutes of  aerobic without signs/symptoms of physical distress Progress to 30 minutes of  aerobic without signs/symptoms of physical distress   Intensity -- THRR unchanged THRR unchanged THRR unchanged THRR unchanged     Progression   Progression Continue to progress workloads to maintain intensity without signs/symptoms of physical distress. Continue to progress workloads to maintain intensity without signs/symptoms of physical distress. Continue to progress workloads to maintain intensity without signs/symptoms of physical distress.  Continue to progress workloads to maintain intensity without signs/symptoms of physical distress. Continue to progress workloads to maintain intensity without signs/symptoms of physical distress.   Average METs 0.43 2 2.3 2.6 2.65     Resistance Training   Training Prescription -- Yes Yes Yes Yes   Weight -- 3 lb 3 lb 5 lb 5 lb   Reps -- 10-15 10-15 10-15 10-15     Interval Training   Interval Training -- No No No No     Recumbant Bike   Level -- 1 2 1.5 --   Watts --  16 19 19  --   Minutes -- 15 15 15  --   METs -- -- 2.49 2.49 --     NuStep   Level -- 2 1 3 3    Minutes -- 15 15 15 15    METs -- 2 1.8 3.2 2.3     REL-XR   Level -- -- -- 1 --   Minutes -- -- -- 15 --   METs -- -- -- 1.8 --     T5 Nustep   Level -- 1 3 -- --   Minutes -- 15 15 -- --   METs -- 2 2 -- --     Biostep-RELP   Level -- -- 1 2 2    Minutes -- -- 15 15 15    METs -- -- -- 3 3     Track   Laps -- -- 31 31 --   Minutes -- -- 15 15 --   METs -- -- 2.69 2.69 --     Oxygen   Maintain Oxygen Saturation -- 88% or higher 88% or higher 88% or higher 88% or higher            Exercise Comments:   Exercise Comments     Row Name 01/27/23 1200           Exercise Comments First full day of exercise!  Patient was oriented to gym and equipment including functions, settings, policies, and procedures.  Patient's individual exercise prescription and treatment plan were reviewed.  All starting workloads were established based on the results of the 6 minute walk test done at initial orientation visit.  The plan for exercise progression was also introduced and progression will be customized based on patient's performance and goals.                Exercise Goals and Review:   Exercise Goals     Row Name 01/14/23 1550             Exercise Goals   Increase Physical Activity Yes       Intervention Provide advice, education, support and counseling about physical activity/exercise needs.;Develop  an individualized exercise prescription for aerobic and resistive training based on initial evaluation findings, risk stratification, comorbidities and participant's personal goals.       Expected Outcomes Short Term: Attend rehab on a regular basis to increase amount of physical activity.;Long Term: Exercising regularly at least 3-5 days a week.;Long Term: Add in home exercise to make exercise part of routine and to increase amount of physical activity.       Increase Strength and Stamina Yes       Intervention Provide advice, education, support and counseling about physical activity/exercise needs.;Develop an individualized exercise prescription for aerobic and resistive training based on initial evaluation findings, risk stratification, comorbidities and participant's personal goals.       Expected Outcomes Short Term: Increase workloads from initial exercise prescription for resistance, speed, and METs.;Long Term: Improve cardiorespiratory fitness, muscular endurance and strength as measured by increased METs and functional capacity ( );Short Term: Perform resistance training exercises routinely during rehab and add in resistance training at home       Able to understand and use rate of perceived exertion (RPE) scale Yes       Intervention Provide education and explanation on how to use RPE scale       Expected Outcomes Short Term: Able to use RPE daily in rehab to express subjective intensity level;Long Term:  Able to use RPE  to guide intensity level when exercising independently       Able to understand and use Dyspnea scale Yes       Intervention Provide education and explanation on how to use Dyspnea scale       Expected Outcomes Short Term: Able to use Dyspnea scale daily in rehab to express subjective sense of shortness of breath during exertion;Long Term: Able to use Dyspnea scale to guide intensity level when exercising independently       Knowledge and understanding of Target Heart Rate  Range (THRR) Yes       Intervention Provide education and explanation of THRR including how the numbers were predicted and where they are located for reference       Expected Outcomes Short Term: Able to state/look up THRR;Long Term: Able to use THRR to govern intensity when exercising independently;Short Term: Able to use daily as guideline for intensity in rehab       Able to check pulse independently Yes       Intervention Provide education and demonstration on how to check pulse in carotid and radial arteries.;Review the importance of being able to check your own pulse for safety during independent exercise       Expected Outcomes Short Term: Able to explain why pulse checking is important during independent exercise;Long Term: Able to check pulse independently and accurately       Understanding of Exercise Prescription Yes       Intervention Provide education, explanation, and written materials on patient's individual exercise prescription       Expected Outcomes Short Term: Able to explain program exercise prescription;Long Term: Able to explain home exercise prescription to exercise independently                Exercise Goals Re-Evaluation :  Exercise Goals Re-Evaluation     Row Name 01/27/23 1200 01/28/23 1035 02/09/23 0939 02/24/23 1455 03/10/23 1503     Exercise Goal Re-Evaluation   Exercise Goals Review Able to understand and use rate of perceived exertion (RPE) scale;Knowledge and understanding of Target Heart Rate Range (THRR);Able to understand and use Dyspnea scale;Understanding of Exercise Prescription Increase Physical Activity;Increase Strength and Stamina;Understanding of Exercise Prescription Increase Physical Activity;Increase Strength and Stamina;Understanding of Exercise Prescription Increase Physical Activity;Increase Strength and Stamina;Understanding of Exercise Prescription Increase Physical Activity;Increase Strength and Stamina;Understanding of Exercise Prescription    Comments Reviewed RPE  and dyspnea scale, THR and program prescription with pt today.  Pt voiced understanding and was given a copy of goals to take home.       Short: Use RPE daily to regulate intensity.  Long: Follow program prescription in THR. Joshua Lara is off to a good start in the program. He was able to increase the level on the T4 nustep to 2. He also used both the recumbent bike and T5 nustep on level 1. We will continue to monitor his progress in the program. Joshua Lara is doing well in the program. He recently was able to walk 31 laps on the track, but only walked once since the last review. He also improved to level 3 on the T5 nustep and level 2 on the recumbent bike. He continues to do well with 3 lb hand weights for resistance training as well. We will continue to monitor his progress in the program. Joshua Lara is doing well in rehab. He continues to do well on the track and consistently walking 31 laps. He also improved to level 3 on the T4 nustep  and level 2 on the biostep. He increased to 5 lb hand weights for resistance training as well. We will continue to monitor his progress in the program. Joshua Lara has only attended one session since the last review. During his one session he was able to work at level 3 on the T4 nustep and level 2 on the biostep. We will encourage him to attend rehab more consistently to see the benefits of exercise. We will continue to monitor his progress in the program.   Expected Outcomes Short: Use RPE daily to regulate intensity. Long: Follow program prescription in THR. Short: Continue to follow current exercise prescription, and progressively increase workloads. Long: Continue exercise to improve strength and stamina. Short: Continue to walk the track consistently. Long: Continue exercise to improve strength and stamina. Short: Continue to progressively increase workloads. Long: Continue exercise to improve strength and stamina. Short: Attend rehab more regularly. Long: Continue  exercise to improve strength and stamina.    Row Name 03/26/23 1404             Exercise Goal Re-Evaluation   Exercise Goals Review Increase Physical Activity;Increase Strength and Stamina;Understanding of Exercise Prescription       Comments Joshua Lara has not been able to attended a session since 10/15. He is on medical hold because of his back, and we will continue to stay in contact with him. We will continue to monitor his progress in the program upon return.       Expected Outcomes Short: Return to rehab when appropriate. Long: Continue exercise to improve strength and stamina.                Discharge Exercise Prescription (Final Exercise Prescription Changes):  Exercise Prescription Changes - 03/10/23 1400       Response to Exercise   Blood Pressure (Admit) 124/74    Blood Pressure (Exercise) 112/64    Blood Pressure (Exit) 110/62    Heart Rate (Admit) 100 bpm    Heart Rate (Exercise) 109 bpm    Heart Rate (Exit) 99 bpm    Rating of Perceived Exertion (Exercise) 16    Symptoms none    Duration Progress to 30 minutes of  aerobic without signs/symptoms of physical distress    Intensity THRR unchanged      Progression   Progression Continue to progress workloads to maintain intensity without signs/symptoms of physical distress.    Average METs 2.65      Resistance Training   Training Prescription Yes    Weight 5 lb    Reps 10-15      Interval Training   Interval Training No      NuStep   Level 3    Minutes 15    METs 2.3      Biostep-RELP   Level 2    Minutes 15    METs 3      Oxygen   Maintain Oxygen Saturation 88% or higher             Nutrition:  Target Goals: Understanding of nutrition guidelines, daily intake of sodium 1500mg , cholesterol 200mg , calories 30% from fat and 7% or less from saturated fats, daily to have 5 or more servings of fruits and vegetables.  Education: All About Nutrition: -Group instruction provided by verbal, written  material, interactive activities, discussions, models, and posters to present general guidelines for heart healthy nutrition including fat, fiber, MyPlate, the role of sodium in heart healthy nutrition, utilization of the nutrition label, and utilization  of this knowledge for meal planning. Follow up email sent as well. Written material given at graduation. Flowsheet Row Cardiac Rehab from 01/14/2023 in [facility] Cardiac and Pulmonary Rehab  Education need identified 01/14/23       Biometrics:  Pre Biometrics - 01/14/23 1551       Pre Biometrics   Height 5' 9.2" (1.758 m)    Weight 270 lb 8 oz (122.7 kg)    Waist Circumference 52 inches    Hip Circumference 52 inches    Waist to Hip Ratio 1 %    BMI (Calculated) 39.7    Single Leg Stand 4.5 seconds              Nutrition Therapy Plan and Nutrition Goals:  Nutrition Therapy & Goals - 01/20/23 1140       Nutrition Therapy   Diet Carb controlled, cardiac, low Na    Protein (specify units) 90    Fiber 30 grams    Whole Grain Foods 3 servings    Saturated Fats 15 max. grams    Fruits and Vegetables 5 servings/day    Sodium 2 grams      Personal Nutrition Goals   Nutrition Goal Eat a controlled portion of carbs at each meal (30-60g each meal) and pair it with a good protein    Personal Goal #2 Watch sodium intake, aim for less than 2300mg  per day    Personal Goal #3 Cut back on fried foods and high saturated fat items    Comments Patient drinking some water, diet soda, but lately drinking a lot of whole milk, says skim milk has more sugar. Reviewed the labels and talked about how skim milk would still be better than whole milk, due to whole milks high saturated fat content. Recommended he choose reduce or no fat dairy options like cheese, ice cream, yogurt, and milk. Reviewed his 24hr food recall, he is ozempic now for 3 weeks. Asked if he is eating smaller portions now with the medication, he reports he is. Recall, has high calorie,  poor quality choices like deli meat, sugary snacks, and fried foods. Spoke with him about changes to make at each meal. Encouraged less fried foods, grilled chicken as a proposed change. Encouraged more veggies at meals to counter balance the high calorie choices and reduce portions of carbs. He struggles to build heart healthy meals as he prefers "country cooking" with lots of sodium, saturated fat, and carb heavy meals. Reviewed mediterranean diet handout to focus on a few areas of change at a time. Built out some example meals with foods he likes and will eat, focus on more veggies, protein paired with controlled portions of carbs. Set goals to work on making more heart healthy changes. Reminded him that small changes are still changes, and could be built upon later into more large lifestyle changes as he progressed.      Intervention Plan   Intervention Prescribe, educate and counsel regarding individualized specific dietary modifications aiming towards targeted core components such as weight, hypertension, lipid management, diabetes, heart failure and other comorbidities.;Nutrition handout(s) given to patient.    Expected Outcomes Short Term Goal: Understand basic principles of dietary content, such as calories, fat, sodium, cholesterol and nutrients.;Short Term Goal: A plan has been developed with personal nutrition goals set during dietitian appointment.;Long Term Goal: Adherence to prescribed nutrition plan.             Nutrition Assessments:  MEDIFICTS Score Key: >=70 Need to make  dietary changes  40-70 Heart Healthy Diet <= 40 Therapeutic Level Cholesterol Diet  Flowsheet Row Cardiac Rehab from 01/14/2023 in [facility] Cardiac and Pulmonary Rehab  Picture Your Plate Total Score on Admission 49      Picture Your Plate Scores: <16 Unhealthy dietary pattern with much room for improvement. 41-50 Dietary pattern unlikely to meet recommendations for good health and room for improvement. 51-60  More healthful dietary pattern, with some room for improvement.  >60 Healthy dietary pattern, although there may be some specific behaviors that could be improved.    Nutrition Goals Re-Evaluation:  Nutrition Goals Re-Evaluation     Row Name 02/10/23 1344             Goals   Comment Joshua Lara has been tyring to incoporate what he learned from his RD appt into his normal routine. He has noticed that since starting ozempic, he finds himself not eating his usual sized portions. He brings snacks with him in case his sugar gets low. He wants to try to continue working on his sodium and protein intake       Expected Outcome Short: read food labels and plan meals out for the day. Long: independently manage a heart healthy diet.                Nutrition Goals Discharge (Final Nutrition Goals Re-Evaluation):  Nutrition Goals Re-Evaluation - 02/10/23 1344       Goals   Comment Joshua Lara has been tyring to incoporate what he learned from his RD appt into his normal routine. He has noticed that since starting ozempic, he finds himself not eating his usual sized portions. He brings snacks with him in case his sugar gets low. He wants to try to continue working on his sodium and protein intake    Expected Outcome Short: read food labels and plan meals out for the day. Long: independently manage a heart healthy diet.             Psychosocial: Target Goals: Acknowledge presence or absence of significant depression and/or stress, maximize coping skills, provide positive support system. Participant is able to verbalize types and ability to use techniques and skills needed for reducing stress and depression.   Education: Stress, Anxiety, and Depression - Group verbal and visual presentation to define topics covered.  Reviews how body is impacted by stress, anxiety, and depression.  Also discusses healthy ways to reduce stress and to treat/manage anxiety and depression.  Written material given at  graduation.   Education: Sleep Hygiene -Provides group verbal and written instruction about how sleep can affect your health.  Define sleep hygiene, discuss sleep cycles and impact of sleep habits. Review good sleep hygiene tips.    Initial Review & Psychosocial Screening:  Initial Psych Review & Screening - 12/31/22 1338       Initial Review   Current issues with None Identified      Family Dynamics   Good Support System? Yes   children     Barriers   Psychosocial barriers to participate in program There are no identifiable barriers or psychosocial needs.      Screening Interventions   Interventions Encouraged to exercise;To provide support and resources with identified psychosocial needs;Provide feedback about the scores to participant    Expected Outcomes Short Term goal: Utilizing psychosocial counselor, staff and physician to assist with identification of specific Stressors or current issues interfering with healing process. Setting desired goal for each stressor or current issue identified.;Long Term Goal:  Stressors or current issues are controlled or eliminated.;Short Term goal: Identification and review with participant of any Quality of Life or Depression concerns found by scoring the questionnaire.;Long Term goal: The participant improves quality of Life and PHQ9 Scores as seen by post scores and/or verbalization of changes             Quality of Life Scores:   Quality of Life - 01/14/23 1552       Quality of Life   Select Quality of Life      Quality of Life Scores   Health/Function Pre 9.97 %    Socioeconomic Pre 19.93 %    Psych/Spiritual Pre 15.08 %    Family Pre 16.5 %    GLOBAL Pre 14 %            Scores of 19 and below usually indicate a poorer quality of life in these areas.  A difference of  2-3 points is a clinically meaningful difference.  A difference of 2-3 points in the total score of the Quality of Life Index has been associated with  significant improvement in overall quality of life, self-image, physical symptoms, and general health in studies assessing change in quality of life.  PHQ-9: Review Flowsheet       01/14/2023 08/23/2018 01/14/2018 07/12/2015  Depression screen PHQ 2/9  Decreased Interest 2 3 1 1   Down, Depressed, Hopeless 3 3 1  0  PHQ - 2 Score 5 6 2 1   Altered sleeping 2 - - -  Tired, decreased energy 3 - - -  Change in appetite 3 - - -  Feeling bad or failure about yourself  1 - - -  Trouble concentrating 2 - - -  Moving slowly or fidgety/restless 0 - - -  Suicidal thoughts 1 - - -  PHQ-9 Score 17 - - -  Difficult doing work/chores Somewhat difficult - - -    Details           Interpretation of Total Score  Total Score Depression Severity:  1-4 = Minimal depression, 5-9 = Mild depression, 10-14 = Moderate depression, 15-19 = Moderately severe depression, 20-27 = Severe depression   Psychosocial Evaluation and Intervention:  Psychosocial Evaluation - 12/31/22 1351       Psychosocial Evaluation & Interventions   Interventions Encouraged to exercise with the program and follow exercise prescription    Comments Ediie has no bariers to attending the program. He is recently widowed (5 years). HIs children are his support.  His daughter,that works at the hospital) helps him keep his medications straight.  He wants to get back to a healthier exercie routine and work on losing about 60 lbs. 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  He does not feel that he has any stress or depression.    Expected Outcomes STG Joshua Lara attends all scheduled sessions, works on exercise and nutrition goals. LTG Joshua Lara continues to work on exercise and nutrition goals after discharge. USing tools and resources he learned about in program    Continue Psychosocial Services  Follow up required by staff             Psychosocial Re-Evaluation:  Psychosocial Re-Evaluation     Row Name 02/10/23 1341              Psychosocial Re-Evaluation   Current issues with Current Stress Concerns       Comments Joshua Lara reports his current stress concern is financial. He  had to pay a lot of money to the government over the last few months. His fridge also gave out this past weekend and he had to pay to replace it. 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 He switched to weekends because it isn't as busy as during the week. He is trying to get up and move more when he is at home and at work, so he is setting an alarm almost every hour to remind himself to get up and take some laps. He enjoys the program so far and is looking forward to continuing to attend. He is grateful for his kids and support system for helping look after him and he is motivated to keep his independence.       Expected Outcomes Short: continue coming to cardiac rehab consistently. Long: graduate from the program.       Interventions Encouraged to attend Cardiac Rehabilitation for the exercise       Continue Psychosocial Services  Follow up required by staff                Psychosocial Discharge (Final Psychosocial Re-Evaluation):  Psychosocial Re-Evaluation - 02/10/23 1341       Psychosocial Re-Evaluation   Current issues with Current Stress Concerns    Comments Joshua Lara reports his current stress concern is financial. He had to pay a lot of money to the government over the last few months. His fridge also gave out this past weekend and he had to pay to replace it. 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 He switched to weekends because it isn't as busy as during the week. He is trying to get up and move more when he is at home and at work, so he is setting an alarm almost every hour to remind himself to get up and take some laps. He enjoys the program so far and is looking forward to continuing to attend. He is grateful for his kids and support system for helping look after  him and he is motivated to keep his independence.    Expected Outcomes Short: continue coming to cardiac rehab consistently. Long: graduate from the program.    Interventions Encouraged to attend Cardiac Rehabilitation for the exercise    Continue Psychosocial Services  Follow up required by staff             Vocational Rehabilitation: Provide vocational rehab assistance to qualifying candidates.   Vocational Rehab Evaluation & Intervention:  Vocational Rehab - 12/31/22 1343       Initial Vocational Rehab Evaluation & Intervention   Assessment shows need for Vocational Rehabilitation No      Vocational Rehab Re-Evaulation   Comments does work part time on weekend             Education: Education Goals: Education classes will be provided on a variety of topics geared toward better understanding of heart health and risk factor modification. Participant will state understanding/return demonstration of topics presented as noted by education test scores.  Learning Barriers/Preferences:   General Cardiac Education Topics:  AED/CPR: - Group verbal and written instruction with the use of models to demonstrate the basic use of the AED with the basic ABC's of resuscitation.   Anatomy and Cardiac Procedures: - Group verbal and visual presentation and models provide information about basic cardiac anatomy and function. Reviews the testing methods done to diagnose heart disease and the  outcomes of the test results. Describes the treatment choices: Medical Management, Angioplasty, or Coronary Bypass Surgery for treating various heart conditions including Myocardial Infarction, Angina, Valve Disease, and Cardiac Arrhythmias.  Written material given at graduation. Flowsheet Row Cardiac Rehab from 01/14/2023 in [facility] Cardiac and Pulmonary Rehab  Education need identified 01/14/23       Medication Safety: - Group verbal and visual instruction to review commonly prescribed medications  for heart and lung disease. Reviews the medication, class of the drug, and side effects. Includes the steps to properly store meds and maintain the prescription regimen.  Written material given at graduation.   Intimacy: - Group verbal instruction through game format to discuss how heart and lung disease can affect sexual intimacy. Written material given at graduation..   Know Your Numbers and Heart Failure: - Group verbal and visual instruction to discuss disease risk factors for cardiac and pulmonary disease and treatment options.  Reviews associated critical values for Overweight/Obesity, Hypertension, Cholesterol, and Diabetes.  Discusses basics of heart failure: signs/symptoms and treatments.  Introduces Heart Failure Zone chart for action plan for heart failure.  Written material given at graduation. Flowsheet Row Cardiac Rehab from 01/14/2023 in [facility] Cardiac and Pulmonary Rehab  Education need identified 01/14/23       Infection Prevention: - Provides verbal and written material to individual with discussion of infection control including proper hand washing and proper equipment cleaning during exercise session. Flowsheet Row Cardiac Rehab from 01/14/2023 in [facility] Cardiac and Pulmonary Rehab  Date 01/14/23  Educator MB  Instruction Review Code 1- Verbalizes Understanding       Falls Prevention: - Provides verbal and written material to individual with discussion of falls prevention and safety. Flowsheet Row Cardiac Rehab from 01/14/2023 in [facility] Cardiac and Pulmonary Rehab  Date 01/14/23  Educator MB  Instruction Review Code 1- Verbalizes Understanding       Other: -Provides group and verbal instruction on various topics (see comments)   Knowledge Questionnaire Score:  Knowledge Questionnaire Score - 01/14/23 1555       Knowledge Questionnaire Score   Pre Score 20/26             Core Components/Risk Factors/Patient Goals at Admission:  Personal Goals and Risk  Factors at Admission - 01/14/23 1555       Core Components/Risk Factors/Patient Goals on Admission    Weight Management Yes;Weight Loss    Intervention Weight Management: Develop a combined nutrition and exercise program designed to reach desired caloric intake, while maintaining appropriate intake of nutrient and fiber, sodium and fats, and appropriate energy expenditure required for the weight goal.;Weight Management: Provide education and appropriate resources to help participant work on and attain dietary goals.;Weight Management/Obesity: Establish reasonable short term and long term weight goals.    Admit Weight 262 lb (118.8 kg)    Goal Weight: Short Term 260 lb (117.9 kg)    Goal Weight: Long Term 200 lb (90.7 kg)    Expected Outcomes Short Term: Continue to assess and modify interventions until short term weight is achieved;Long Term: Adherence to nutrition and physical activity/exercise program aimed toward attainment of established weight goal;Weight Loss: Understanding of general recommendations for a balanced deficit meal plan, which promotes 1-2 lb weight loss per week and includes a negative energy balance of [phone] kcal/d;Understanding recommendations for meals to include 15-35% energy as protein, 25-35% energy from fat, 35-60% energy from carbohydrates, less than 200mg  of dietary cholesterol, 20-35 gm of total fiber daily;Understanding of distribution of  calorie intake throughout the day with the consumption of 4-5 meals/snacks    Diabetes Yes    Intervention Provide education about signs/symptoms and action to take for hypo/hyperglycemia.;Provide education about proper nutrition, including hydration, and aerobic/resistive exercise prescription along with prescribed medications to achieve blood glucose in normal ranges: Fasting glucose 65-99 mg/dL    Expected Outcomes Short Term: Participant verbalizes understanding of the signs/symptoms and immediate care of hyper/hypoglycemia, proper  foot care and importance of medication, aerobic/resistive exercise and nutrition plan for blood glucose control.;Long Term: Attainment of HbA1C < 7%.    Hypertension Yes    Intervention Provide education on lifestyle modifcations including regular physical activity/exercise, weight management, moderate sodium restriction and increased consumption of fresh fruit, vegetables, and low fat dairy, alcohol moderation, and smoking cessation.;Monitor prescription use compliance.    Expected Outcomes Short Term: Continued assessment and intervention until BP is < 140/70mm HG in hypertensive participants. < 130/87mm HG in hypertensive participants with diabetes, heart failure or chronic kidney disease.;Long Term: Maintenance of blood pressure at goal levels.    Lipids Yes    Intervention Provide education and support for participant on nutrition & aerobic/resistive exercise along with prescribed medications to achieve LDL 70mg , HDL >40mg .    Expected Outcomes Short Term: Participant states understanding of desired cholesterol values and is compliant with medications prescribed. Participant is following exercise prescription and nutrition guidelines.;Long Term: Cholesterol controlled with medications as prescribed, with individualized exercise RX and with personalized nutrition plan. Value goals: LDL < 70mg , HDL > 40 mg.             Education:Diabetes - Individual verbal and written instruction to review signs/symptoms of diabetes, desired ranges of glucose level fasting, after meals and with exercise. Acknowledge that pre and post exercise glucose checks will be done for 3 sessions at entry of program. Flowsheet Row Cardiac Rehab from 01/14/2023 in [facility] Cardiac and Pulmonary Rehab  Date 01/14/23  Educator MB  Instruction Review Code 1- Verbalizes Understanding       Core Components/Risk Factors/Patient Goals Review:   Goals and Risk Factor Review     Row Name 02/10/23 1249             Core  Components/Risk Factors/Patient Goals Review   Personal Goals Review Diabetes;Hypertension;Lipids       Review Joshua Lara has enjoyed the program the few weeks he has been here. He feels encouraged to start being more mobile. He recently read an article about how important movement is so he has been setting an alarm on his phone to take a couple laps around the house every hour. He can tell he is already starting to feel better between more exercise in the program and paying more attention to what he eats. He has noticed his sugar has been lower at times than usual, so he is using his libra monitor and paying closer attention to his how many units of insulin he is giving himself. He also started ozempic recently so he is aware of how this will change his eating habits and CBG readings. He using his Repatha correctly and states he is trying to monitor his cholesterol and wants to keep exercising to help.       Expected Outcomes Short: attend cardiac rehab for education on cholesterol management and continue closely monitoring his CBG readings. Long: independently manage risk factors.                Core Components/Risk Factors/Patient Goals at Discharge (Final Review):  Goals and Risk Factor Review - 02/10/23 1249       Core Components/Risk Factors/Patient Goals Review   Personal Goals Review Diabetes;Hypertension;Lipids    Review Joshua Lara has enjoyed the program the few weeks he has been here. He feels encouraged to start being more mobile. He recently read an article about how important movement is so he has been setting an alarm on his phone to take a couple laps around the house every hour. He can tell he is already starting to feel better between more exercise in the program and paying more attention to what he eats. He has noticed his sugar has been lower at times than usual, so he is using his libra monitor and paying closer attention to his how many units of insulin he is giving himself. He also  started ozempic recently so he is aware of how this will change his eating habits and CBG readings. He using his Repatha correctly and states he is trying to monitor his cholesterol and wants to keep exercising to help.    Expected Outcomes Short: attend cardiac rehab for education on cholesterol management and continue closely monitoring his CBG readings. Long: independently manage risk factors.             ITP Comments:  ITP Comments     Row Name 12/31/22 1356 01/14/23 1541 01/27/23 1159 02/11/23 1225 03/11/23 1416   ITP Comments Virtual orientation call completed today. he has an appointment on Date: 01/14/2023  for EP eval and gym Orientation.  Documentation of diagnosis can be found in [facility] 12/06/2022 . Completed and gym orientation. Initial ITP created and sent for review to Bethann Punches, Medical Director. First full day of exercise!  Patient was oriented to gym and equipment including functions, settings, policies, and procedures.  Patient's individual exercise prescription and treatment plan were reviewed.  All starting workloads were established based on the results of the 6 minute walk test done at initial orientation visit.  The plan for exercise progression was also introduced and progression will be customized based on patient's performance and goals. 30 Day review completed. Medical Director ITP review done, changes made as directed, and signed approval by Medical Director.    new to program 30 Day review completed. Medical Director ITP review done, changes made as directed, and signed approval by Medical Director.    Row Name 03/18/23 1412 03/30/23 1710 03/31/23 1507 04/01/23 1016     ITP Comments Joshua Lara has been placed on medical hold while he sees a back specialist. He has been calling us to keep [place] informed. Joshua Lara will let us know after he sees specialist for the plan moving forward. Called to check on pt. He is still on medical hold while seeing back specialist. No answer at this  time. Will try to reach pt at a later time. Joshua Lara called to inform staff he is still waiting on his MRI and then follow up appointment with his MD. He still wishes to be placed on medical hold a this time and hopes to have a better timeline of his hopeful return very soon. 30 Day review completed. Medical Director ITP review done, changes made as directed, and signed approval by Medical Director.    medical hold at this time             Comments:

## 2023-04-01 NOTE — Telephone Encounter (Signed)
Per ITP note 11/19: Joshua Lara called to inform staff he is still waiting on his MRI and then follow up appointment with his MD. He still wishes to be placed on medical hold a this time and hopes to have a better timeline of his hopeful return very soon. Will follow up next week.

## 2023-04-02 ENCOUNTER — Encounter: Payer: PPO | Admitting: *Deleted

## 2023-04-06 DIAGNOSIS — G4733 Obstructive sleep apnea (adult) (pediatric): Secondary | ICD-10-CM | POA: Diagnosis not present

## 2023-04-07 DIAGNOSIS — I480 Paroxysmal atrial fibrillation: Secondary | ICD-10-CM | POA: Diagnosis not present

## 2023-04-07 DIAGNOSIS — Z7985 Long-term (current) use of injectable non-insulin antidiabetic drugs: Secondary | ICD-10-CM | POA: Diagnosis not present

## 2023-04-07 DIAGNOSIS — Z6836 Body mass index (BMI) 36.0-36.9, adult: Secondary | ICD-10-CM | POA: Diagnosis not present

## 2023-04-07 DIAGNOSIS — E261 Secondary hyperaldosteronism: Secondary | ICD-10-CM | POA: Diagnosis not present

## 2023-04-07 DIAGNOSIS — E119 Type 2 diabetes mellitus without complications: Secondary | ICD-10-CM | POA: Diagnosis not present

## 2023-04-07 DIAGNOSIS — I509 Heart failure, unspecified: Secondary | ICD-10-CM | POA: Diagnosis not present

## 2023-04-07 DIAGNOSIS — Z7901 Long term (current) use of anticoagulants: Secondary | ICD-10-CM | POA: Diagnosis not present

## 2023-04-07 DIAGNOSIS — Z794 Long term (current) use of insulin: Secondary | ICD-10-CM | POA: Diagnosis not present

## 2023-04-07 DIAGNOSIS — M545 Low back pain, unspecified: Secondary | ICD-10-CM | POA: Diagnosis not present

## 2023-04-07 DIAGNOSIS — D692 Other nonthrombocytopenic purpura: Secondary | ICD-10-CM | POA: Diagnosis not present

## 2023-04-07 DIAGNOSIS — D6869 Other thrombophilia: Secondary | ICD-10-CM | POA: Diagnosis not present

## 2023-04-08 DIAGNOSIS — H401112 Primary open-angle glaucoma, right eye, moderate stage: Secondary | ICD-10-CM | POA: Diagnosis not present

## 2023-04-08 DIAGNOSIS — H401123 Primary open-angle glaucoma, left eye, severe stage: Secondary | ICD-10-CM | POA: Diagnosis not present

## 2023-04-11 DIAGNOSIS — J449 Chronic obstructive pulmonary disease, unspecified: Secondary | ICD-10-CM | POA: Diagnosis not present

## 2023-04-15 DIAGNOSIS — M5416 Radiculopathy, lumbar region: Secondary | ICD-10-CM | POA: Diagnosis not present

## 2023-04-19 ENCOUNTER — Other Ambulatory Visit: Payer: Self-pay | Admitting: Cardiology

## 2023-04-19 DIAGNOSIS — I252 Old myocardial infarction: Secondary | ICD-10-CM

## 2023-04-19 DIAGNOSIS — Z955 Presence of coronary angioplasty implant and graft: Secondary | ICD-10-CM

## 2023-04-19 DIAGNOSIS — I2510 Atherosclerotic heart disease of native coronary artery without angina pectoris: Secondary | ICD-10-CM

## 2023-04-20 ENCOUNTER — Encounter: Payer: Self-pay | Admitting: *Deleted

## 2023-04-20 ENCOUNTER — Encounter: Payer: Self-pay | Admitting: Pharmacist

## 2023-04-20 ENCOUNTER — Telehealth: Payer: Self-pay | Admitting: Cardiology

## 2023-04-20 DIAGNOSIS — Z955 Presence of coronary angioplasty implant and graft: Secondary | ICD-10-CM

## 2023-04-20 DIAGNOSIS — I213 ST elevation (STEMI) myocardial infarction of unspecified site: Secondary | ICD-10-CM

## 2023-04-20 NOTE — Progress Notes (Signed)
Discharge Summary:  Joshua Lara  (DOB: Feb 02, 1946)  [name] discharged early from cardiac rehab due to back issues and seeing a back specialist at this time. He completed 11/36 sessions.    6 Minute Walk     Row Name 01/14/23 1542         6 Minute Walk   Phase Initial     Distance 530 feet     Walk Time 5.42 minutes     # of Rest Breaks 3     MPH 1.1     METS 0.43     RPE 15     Perceived Dyspnea  2     VO2 Peak 1.52     Symptoms Yes (comment)     Comments Back Pain, Knees     Resting HR 82 bpm     Resting BP 110/64     Resting Oxygen Saturation  95 %     Exercise Oxygen Saturation  during 6 min walk 96 %     Max Ex. HR 108 bpm     Max Ex. BP 112/80     2 Minute Post BP 110/78

## 2023-04-20 NOTE — Progress Notes (Signed)
Cardiac Individual Treatment Plan  Patient Details  Name: Joshua Lara MRN: [id number] Date of Birth: 1945-07-05 Referring Provider:   Flowsheet Row Cardiac Rehab from 01/14/2023 in [facility] Cardiac and Pulmonary Rehab  Referring Provider [name]       Initial Encounter Date:  Flowsheet Row Cardiac Rehab from 01/14/2023 in [facility] Cardiac and Pulmonary Rehab  Date 01/14/23       Visit Diagnosis: ST elevation myocardial infarction (STEMI), unspecified artery [facility])  Status post coronary artery stent placement  Patient's Home Medications on Admission:  Current Outpatient Medications:    acetaminophen (TYLENOL) 650 MG CR tablet, Take 650 mg by mouth every 8 (eight) hours as needed for pain., Disp: , Rfl:    albuterol (VENTOLIN HFA) 108 (90 Base) MCG/ACT inhaler, Inhale 1-2 puffs into the lungs every 4 (four) hours as needed for wheezing or shortness of breath., Disp: , Rfl:    amoxicillin-clavulanate (AUGMENTIN) 875-125 MG tablet, Take 1 tablet by mouth 2 (two) times daily., Disp: 14 tablet, Rfl: 0   apixaban (ELIQUIS) 5 MG TABS tablet, TAKE 1 TABLET BY MOUTH EVERY 12 HOURS (Patient taking differently: Take 5 mg by mouth every 12 (twelve) hours.), Disp: 60 tablet, Rfl: 10   Armodafinil 150 MG tablet, Take 150 mg by mouth daily., Disp: , Rfl:    BD PEN NEEDLE NANO 2ND GEN 32G X 4 MM MISC, , Disp: , Rfl:    clobetasol cream (TEMOVATE) 0.05 %, Apply 1 Application topically as needed (breakouts on left arm)., Disp: , Rfl:    clopidogrel (PLAVIX) 75 MG tablet, Take 1 tablet (75 mg total) by mouth daily., Disp: 90 tablet, Rfl: 1   Continuous Glucose Sensor (FREESTYLE LIBRE 2 SENSOR) MISC, Inject 1 Device into the skin every 14 (fourteen) days., Disp: , Rfl:    cyanocobalamin (VITAMIN B12) 500 MCG tablet, Take 500 mcg by mouth daily., Disp: , Rfl:    Evolocumab (REPATHA SURECLICK) 140 MG/ML SOAJ, Inject 140 mg into the skin every 14 (fourteen) days., Disp: 6 mL, Rfl: 1   fenofibrate (TRICOR) 145  MG tablet, TAKE 1 TABLET BY MOUTH EVERY DAY (Patient taking differently: Take 145 mg by mouth daily.), Disp: 90 tablet, Rfl: 1   fluticasone (FLONASE) 50 MCG/ACT nasal spray, Place 1 spray into both nostrils 2 (two) times daily., Disp: , Rfl:    folic acid (FOLVITE) 1 MG tablet, Take 1 mg by mouth daily., Disp: , Rfl:    HUMULIN R U-500 KWIKPEN 500 UNIT/ML kwikpen, Inject 125-150 Units into the skin 2 (two) times daily. , Disp: , Rfl:    latanoprost (XALATAN) 0.005 % ophthalmic solution, Place 1 drop into both eyes in the morning and at bedtime., Disp: , Rfl:    levothyroxine (SYNTHROID, LEVOTHROID) 100 MCG tablet, Take 100 mcg by mouth daily., Disp: , Rfl:    losartan (COZAAR) 25 MG tablet, TAKE 1 TABLET BY MOUTH EVERY EVENING, Disp: 90 tablet, Rfl: 1   methotrexate 2.5 MG tablet, Take 2.5 mg by mouth once a week. (Patient not taking: Reported on 01/19/2023), Disp: , Rfl:    Multiple Vitamins-Minerals (CENTRUM SILVER PO), Take 1 tablet by mouth daily., Disp: , Rfl:    nitroGLYCERIN (NITROSTAT) 0.4 MG SL tablet, Place 1 tablet (0.4 mg total) under the tongue every 5 (five) minutes as needed for chest pain. If you require more than two tablets five minutes apart go to the nearest ER via EMS., Disp: 30 tablet, Rfl: 0   pantoprazole (PROTONIX) 40 MG tablet,  Take 40 mg by mouth daily., Disp: , Rfl:    pravastatin (PRAVACHOL) 40 MG tablet, TAKE 1 TABLET BY MOUTH DAILY AT 6 PM., Disp: 90 tablet, Rfl: 3   Semaglutide,0.25 or 0.5MG /DOS, (OZEMPIC, 0.25 OR 0.5 MG/DOSE,) 2 MG/3ML SOPN, INJECT 0.25 MG INTO THE SKIN ONCE A WEEK FOR 4 DOSES, Disp: 6 mL, Rfl: 0   sotalol (BETAPACE) 80 MG tablet, Take 0.5 tablets (40 mg total) by mouth 2 (two) times daily., Disp: , Rfl:    tamsulosin (FLOMAX) 0.4 MG CAPS capsule, Take 0.4 mg by mouth daily after supper., Disp: , Rfl:    Vitamin D, Ergocalciferol, (DRISDOL) 1.25 MG (50000 UNIT) CAPS capsule, Take 50,000 Units by mouth once a week., Disp: , Rfl:   Past Medical  History: Past Medical History:  Diagnosis Date   Arthritis    "hands, back, ankles" (05/11/2014)   CHF (congestive heart failure) (HCC)    Chronic lower back pain    Compression fracture of lumbar vertebra (HCC)    Coronary artery disease    Daily headache    "recently" (05/11/2014)   Depression    "I might be slight" (05/11/2014)   GERD (gastroesophageal reflux disease)    High cholesterol    Hypertension    Hypothyroid    OSA treated with BiPAP    Pneumonia    "couple times; last time was 03/2011" (05/11/2014)   Type II diabetes mellitus (HCC)     Tobacco Use: Social History   Tobacco Use  Smoking Status Former   Current packs/day: 0.00   Average packs/day: 2.0 packs/day for 30.0 years (60.0 ttl pk-yrs)   Types: Cigarettes   Start date: 05/12/1960   Quit date: 05/12/1990   Years since quitting: 32.9  Smokeless Tobacco Never    Labs: Review Flowsheet  More data exists      Latest Ref Rng & Units 11/04/2016 12/17/2018 12/06/2022 12/07/2022 12/08/2022  Labs for ITP Cardiac and Pulmonary Rehab  Cholestrol 0 - 200 mg/dL - 034  98  - 93   LDL (calc) 0 - 99 mg/dL - 61  - - 30   Direct LDL 0 - 99 mg/dL - - - 36  35   HDL-C >74 mg/dL - 59  36  - 39   Trlycerides <150 mg/dL - 259  563  - 875   Hemoglobin A1c 4.8 - 5.6 % - 6.4  7.4  7.1  -  PH, Arterial 7.350 - 7.450 7.264  - - - -  PCO2 arterial 32.0 - 48.0 mmHg 48.6  - - - -  Bicarbonate 20.0 - 28.0 mmol/L 22.0  - - - -  TCO2 22 - 32 mmol/L 23  - - 23  -  Acid-base deficit 0.0 - 2.0 mmol/L 5.0  - - - -  O2 Saturation % 96.0  - - - -    Details             Exercise Target Goals: Exercise Program Goal: Individual exercise prescription set using results from initial 6 min walk test and THRR while considering  patient's activity barriers and safety.   Exercise Prescription Goal: Initial exercise prescription builds to 30-45 minutes a day of aerobic activity, 2-3 days per week.  Home exercise guidelines will be given to  patient during program as part of exercise prescription that the participant will acknowledge.   Education: Aerobic Exercise: - Group verbal and visual presentation on the components of exercise prescription. Introduces F.I.T.T principle from ACSM for  exercise prescriptions.  Reviews F.I.T.T. principles of aerobic exercise including progression. Written material given at graduation. Flowsheet Row Cardiac Rehab from 01/14/2023 in [facility] Cardiac and Pulmonary Rehab  Education need identified 01/14/23       Education: Resistance Exercise: - Group verbal and visual presentation on the components of exercise prescription. Introduces F.I.T.T principle from ACSM for exercise prescriptions  Reviews F.I.T.T. principles of resistance exercise including progression. Written material given at graduation.    Education: Exercise & Equipment Safety: - Individual verbal instruction and demonstration of equipment use and safety with use of the equipment. Flowsheet Row Cardiac Rehab from 01/14/2023 in [facility] Cardiac and Pulmonary Rehab  Date 01/14/23  Educator MB  Instruction Review Code 1- Verbalizes Understanding       Education: Exercise Physiology & General Exercise Guidelines: - Group verbal and written instruction with models to review the exercise physiology of the cardiovascular system and associated critical values. Provides general exercise guidelines with specific guidelines to those with heart or lung disease.    Education: Flexibility, Balance, Mind/Body Relaxation: - Group verbal and visual presentation with interactive activity on the components of exercise prescription. Introduces F.I.T.T principle from ACSM for exercise prescriptions. Reviews F.I.T.T. principles of flexibility and balance exercise training including progression. Also discusses the mind body connection.  Reviews various relaxation techniques to help reduce and manage stress (i.e. Deep breathing, progressive muscle relaxation, and  visualization). Balance handout provided to take home. Written material given at graduation.   Activity Barriers & Risk Stratification:  Activity Barriers & Cardiac Risk Stratification - 01/14/23 1544       Activity Barriers & Cardiac Risk Stratification   Activity Barriers Back Problems;Arthritis;Deconditioning;Other (comment)    Comments Fatigue    Cardiac Risk Stratification Moderate             6 Minute Walk:  6 Minute Walk     Row Name 01/14/23 1542         6 Minute Walk   Phase Initial     Distance 530 feet     Walk Time 5.42 minutes     # of Rest Breaks 3     MPH 1.1     METS 0.43     RPE 15     Perceived Dyspnea  2     VO2 Peak 1.52     Symptoms Yes (comment)     Comments Back Pain, Knees     Resting HR 82 bpm     Resting BP 110/64     Resting Oxygen Saturation  95 %     Exercise Oxygen Saturation  during 6 min walk 96 %     Max Ex. HR 108 bpm     Max Ex. BP 112/80     2 Minute Post BP 110/78              Oxygen Initial Assessment:   Oxygen Re-Evaluation:   Oxygen Discharge (Final Oxygen Re-Evaluation):   Initial Exercise Prescription:  Initial Exercise Prescription - 01/14/23 1500       Date of Initial Exercise RX and Referring Provider   Date 01/14/23    Referring Provider [name]      Oxygen   Maintain Oxygen Saturation 88% or higher      Recumbant Bike   Level 1    RPM 50    Watts 15    Minutes 15    METs 0.43      NuStep   Level 1  SPM 80    Minutes 15    METs 0.43      T5 Nustep   Level 1    SPM 80    Minutes 15    METs 0.43      Biostep-RELP   Level 1    SPM 50    Minutes 15    METs 0.43      Track   Laps 5    Minutes 15    METs 1.27      Prescription Details   Frequency (times per week) 2    Duration Progress to 30 minutes of continuous aerobic without signs/symptoms of physical distress      Intensity   THRR 40-80% of Max Heartrate 106-131    Ratings of Perceived Exertion 11-13     Perceived Dyspnea 0-4      Progression   Progression Continue to progress workloads to maintain intensity without signs/symptoms of physical distress.      Resistance Training   Training Prescription Yes    Weight 3 lb    Reps 10-15             Perform Capillary Blood Glucose checks as needed.  Exercise Prescription Changes:   Exercise Prescription Changes     Row Name 01/14/23 1500 01/28/23 1000 02/09/23 0900 02/24/23 1400 03/10/23 1400     Response to Exercise   Blood Pressure (Admit) 110/64 118/64 122/70 126/64 124/74   Blood Pressure (Exercise) 112/80 132/70 160/84 132/66 112/64   Blood Pressure (Exit) 110/78 118/60 126/64 118/88 110/62   Heart Rate (Admit) 82 bpm 101 bpm 104 bpm 98 bpm 100 bpm   Heart Rate (Exercise) 108 bpm 115 bpm 113 bpm 119 bpm 109 bpm   Heart Rate (Exit) 100 bpm 104 bpm 98 bpm 100 bpm 99 bpm   Oxygen Saturation (Admit) 95 % -- -- -- --   Oxygen Saturation (Exercise) 96 % -- -- -- --   Oxygen Saturation (Exit) 96 % -- -- -- --   Rating of Perceived Exertion (Exercise) 15 15 15 15 16    Perceived Dyspnea (Exercise) 2 0 -- -- --   Symptoms Back and knee pain none none none none   Comments results First week of exercise -- -- --   Duration -- Progress to 30 minutes of  aerobic without signs/symptoms of physical distress Progress to 30 minutes of  aerobic without signs/symptoms of physical distress Progress to 30 minutes of  aerobic without signs/symptoms of physical distress Progress to 30 minutes of  aerobic without signs/symptoms of physical distress   Intensity -- THRR unchanged THRR unchanged THRR unchanged THRR unchanged     Progression   Progression Continue to progress workloads to maintain intensity without signs/symptoms of physical distress. Continue to progress workloads to maintain intensity without signs/symptoms of physical distress. Continue to progress workloads to maintain intensity without signs/symptoms of physical distress.  Continue to progress workloads to maintain intensity without signs/symptoms of physical distress. Continue to progress workloads to maintain intensity without signs/symptoms of physical distress.   Average METs 0.43 2 2.3 2.6 2.65     Resistance Training   Training Prescription -- Yes Yes Yes Yes   Weight -- 3 lb 3 lb 5 lb 5 lb   Reps -- 10-15 10-15 10-15 10-15     Interval Training   Interval Training -- No No No No     Recumbant Bike   Level -- 1 2 1.5 --   Watts --  16 19 19  --   Minutes -- 15 15 15  --   METs -- -- 2.49 2.49 --     NuStep   Level -- 2 1 3 3    Minutes -- 15 15 15 15    METs -- 2 1.8 3.2 2.3     REL-XR   Level -- -- -- 1 --   Minutes -- -- -- 15 --   METs -- -- -- 1.8 --     T5 Nustep   Level -- 1 3 -- --   Minutes -- 15 15 -- --   METs -- 2 2 -- --     Biostep-RELP   Level -- -- 1 2 2    Minutes -- -- 15 15 15    METs -- -- -- 3 3     Track   Laps -- -- 31 31 --   Minutes -- -- 15 15 --   METs -- -- 2.69 2.69 --     Oxygen   Maintain Oxygen Saturation -- 88% or higher 88% or higher 88% or higher 88% or higher            Exercise Comments:   Exercise Comments     Row Name 01/27/23 1200           Exercise Comments First full day of exercise!  Patient was oriented to gym and equipment including functions, settings, policies, and procedures.  Patient's individual exercise prescription and treatment plan were reviewed.  All starting workloads were established based on the results of the 6 minute walk test done at initial orientation visit.  The plan for exercise progression was also introduced and progression will be customized based on patient's performance and goals.                Exercise Goals and Review:   Exercise Goals     Row Name 01/14/23 1550             Exercise Goals   Increase Physical Activity Yes       Intervention Provide advice, education, support and counseling about physical activity/exercise needs.;Develop  an individualized exercise prescription for aerobic and resistive training based on initial evaluation findings, risk stratification, comorbidities and participant's personal goals.       Expected Outcomes Short Term: Attend rehab on a regular basis to increase amount of physical activity.;Long Term: Exercising regularly at least 3-5 days a week.;Long Term: Add in home exercise to make exercise part of routine and to increase amount of physical activity.       Increase Strength and Stamina Yes       Intervention Provide advice, education, support and counseling about physical activity/exercise needs.;Develop an individualized exercise prescription for aerobic and resistive training based on initial evaluation findings, risk stratification, comorbidities and participant's personal goals.       Expected Outcomes Short Term: Increase workloads from initial exercise prescription for resistance, speed, and METs.;Long Term: Improve cardiorespiratory fitness, muscular endurance and strength as measured by increased METs and functional capacity ( );Short Term: Perform resistance training exercises routinely during rehab and add in resistance training at home       Able to understand and use rate of perceived exertion (RPE) scale Yes       Intervention Provide education and explanation on how to use RPE scale       Expected Outcomes Short Term: Able to use RPE daily in rehab to express subjective intensity level;Long Term:  Able to use RPE  to guide intensity level when exercising independently       Able to understand and use Dyspnea scale Yes       Intervention Provide education and explanation on how to use Dyspnea scale       Expected Outcomes Short Term: Able to use Dyspnea scale daily in rehab to express subjective sense of shortness of breath during exertion;Long Term: Able to use Dyspnea scale to guide intensity level when exercising independently       Knowledge and understanding of Target Heart Rate  Range (THRR) Yes       Intervention Provide education and explanation of THRR including how the numbers were predicted and where they are located for reference       Expected Outcomes Short Term: Able to state/look up THRR;Long Term: Able to use THRR to govern intensity when exercising independently;Short Term: Able to use daily as guideline for intensity in rehab       Able to check pulse independently Yes       Intervention Provide education and demonstration on how to check pulse in carotid and radial arteries.;Review the importance of being able to check your own pulse for safety during independent exercise       Expected Outcomes Short Term: Able to explain why pulse checking is important during independent exercise;Long Term: Able to check pulse independently and accurately       Understanding of Exercise Prescription Yes       Intervention Provide education, explanation, and written materials on patient's individual exercise prescription       Expected Outcomes Short Term: Able to explain program exercise prescription;Long Term: Able to explain home exercise prescription to exercise independently                Exercise Goals Re-Evaluation :  Exercise Goals Re-Evaluation     Row Name 01/27/23 1200 01/28/23 1035 02/09/23 0939 02/24/23 1455 03/10/23 1503     Exercise Goal Re-Evaluation   Exercise Goals Review Able to understand and use rate of perceived exertion (RPE) scale;Knowledge and understanding of Target Heart Rate Range (THRR);Able to understand and use Dyspnea scale;Understanding of Exercise Prescription Increase Physical Activity;Increase Strength and Stamina;Understanding of Exercise Prescription Increase Physical Activity;Increase Strength and Stamina;Understanding of Exercise Prescription Increase Physical Activity;Increase Strength and Stamina;Understanding of Exercise Prescription Increase Physical Activity;Increase Strength and Stamina;Understanding of Exercise Prescription    Comments Reviewed RPE  and dyspnea scale, THR and program prescription with pt today.  Pt voiced understanding and was given a copy of goals to take home.       Short: Use RPE daily to regulate intensity.  Long: Follow program prescription in THR. Joshua Lara is off to a good start in the program. He was able to increase the level on the T4 nustep to 2. He also used both the recumbent bike and T5 nustep on level 1. We will continue to monitor his progress in the program. Joshua Lara is doing well in the program. He recently was able to walk 31 laps on the track, but only walked once since the last review. He also improved to level 3 on the T5 nustep and level 2 on the recumbent bike. He continues to do well with 3 lb hand weights for resistance training as well. We will continue to monitor his progress in the program. Joshua Lara is doing well in rehab. He continues to do well on the track and consistently walking 31 laps. He also improved to level 3 on the T4 nustep  and level 2 on the biostep. He increased to 5 lb hand weights for resistance training as well. We will continue to monitor his progress in the program. Joshua Lara has only attended one session since the last review. During his one session he was able to work at level 3 on the T4 nustep and level 2 on the biostep. We will encourage him to attend rehab more consistently to see the benefits of exercise. We will continue to monitor his progress in the program.   Expected Outcomes Short: Use RPE daily to regulate intensity. Long: Follow program prescription in THR. Short: Continue to follow current exercise prescription, and progressively increase workloads. Long: Continue exercise to improve strength and stamina. Short: Continue to walk the track consistently. Long: Continue exercise to improve strength and stamina. Short: Continue to progressively increase workloads. Long: Continue exercise to improve strength and stamina. Short: Attend rehab more regularly. Long: Continue  exercise to improve strength and stamina.    Row Name 03/26/23 1404 04/08/23 0911           Exercise Goal Re-Evaluation   Exercise Goals Review Increase Physical Activity;Increase Strength and Stamina;Understanding of Exercise Prescription Increase Physical Activity;Increase Strength and Stamina;Understanding of Exercise Prescription      Comments Joshua Lara has not been able to attended a session since 10/15. He is on medical hold because of his back, and we will continue to stay in contact with him. We will continue to monitor his progress in the program upon return. Joshua Lara continues to be on medical hold due to his back. He recently called to inform staff he is still waiting on his MRI and then follow up appointment with his MD. He hopes to have a better timeline of his hopeful return very soon. We will continue to monitor his progress in the program upon return.      Expected Outcomes Short: Return to rehab when appropriate. Long: Continue exercise to improve strength and stamina. Short: Return to rehab when appropriate. Long: Continue exercise to improve strength and stamina.               Discharge Exercise Prescription (Final Exercise Prescription Changes):  Exercise Prescription Changes - 03/10/23 1400       Response to Exercise   Blood Pressure (Admit) 124/74    Blood Pressure (Exercise) 112/64    Blood Pressure (Exit) 110/62    Heart Rate (Admit) 100 bpm    Heart Rate (Exercise) 109 bpm    Heart Rate (Exit) 99 bpm    Rating of Perceived Exertion (Exercise) 16    Symptoms none    Duration Progress to 30 minutes of  aerobic without signs/symptoms of physical distress    Intensity THRR unchanged      Progression   Progression Continue to progress workloads to maintain intensity without signs/symptoms of physical distress.    Average METs 2.65      Resistance Training   Training Prescription Yes    Weight 5 lb    Reps 10-15      Interval Training   Interval Training No       NuStep   Level 3    Minutes 15    METs 2.3      Biostep-RELP   Level 2    Minutes 15    METs 3      Oxygen   Maintain Oxygen Saturation 88% or higher             Nutrition:  Target Goals: Understanding  of nutrition guidelines, daily intake of sodium 1500mg , cholesterol 200mg , calories 30% from fat and 7% or less from saturated fats, daily to have 5 or more servings of fruits and vegetables.  Education: All About Nutrition: -Group instruction provided by verbal, written material, interactive activities, discussions, models, and posters to present general guidelines for heart healthy nutrition including fat, fiber, MyPlate, the role of sodium in heart healthy nutrition, utilization of the nutrition label, and utilization of this knowledge for meal planning. Follow up email sent as well. Written material given at graduation. Flowsheet Row Cardiac Rehab from 01/14/2023 in [facility] Cardiac and Pulmonary Rehab  Education need identified 01/14/23       Biometrics:  Pre Biometrics - 01/14/23 1551       Pre Biometrics   Height 5' 9.2" (1.758 m)    Weight 270 lb 8 oz (122.7 kg)    Waist Circumference 52 inches    Hip Circumference 52 inches    Waist to Hip Ratio 1 %    BMI (Calculated) 39.7    Single Leg Stand 4.5 seconds              Nutrition Therapy Plan and Nutrition Goals:  Nutrition Therapy & Goals - 01/20/23 1140       Nutrition Therapy   Diet Carb controlled, cardiac, low Na    Protein (specify units) 90    Fiber 30 grams    Whole Grain Foods 3 servings    Saturated Fats 15 max. grams    Fruits and Vegetables 5 servings/day    Sodium 2 grams      Personal Nutrition Goals   Nutrition Goal Eat a controlled portion of carbs at each meal (30-60g each meal) and pair it with a good protein    Personal Goal #2 Watch sodium intake, aim for less than 2300mg  per day    Personal Goal #3 Cut back on fried foods and high saturated fat items    Comments Patient  drinking some water, diet soda, but lately drinking a lot of whole milk, says skim milk has more sugar. Reviewed the labels and talked about how skim milk would still be better than whole milk, due to whole milks high saturated fat content. Recommended he choose reduce or no fat dairy options like cheese, ice cream, yogurt, and milk. Reviewed his 24hr food recall, he is ozempic now for 3 weeks. Asked if he is eating smaller portions now with the medication, he reports he is. Recall, has high calorie, poor quality choices like deli meat, sugary snacks, and fried foods. Spoke with him about changes to make at each meal. Encouraged less fried foods, grilled chicken as a proposed change. Encouraged more veggies at meals to counter balance the high calorie choices and reduce portions of carbs. He struggles to build heart healthy meals as he prefers "country cooking" with lots of sodium, saturated fat, and carb heavy meals. Reviewed mediterranean diet handout to focus on a few areas of change at a time. Built out some example meals with foods he likes and will eat, focus on more veggies, protein paired with controlled portions of carbs. Set goals to work on making more heart healthy changes. Reminded him that small changes are still changes, and could be built upon later into more large lifestyle changes as he progressed.      Intervention Plan   Intervention Prescribe, educate and counsel regarding individualized specific dietary modifications aiming towards targeted core components such as weight, hypertension, lipid  management, diabetes, heart failure and other comorbidities.;Nutrition handout(s) given to patient.    Expected Outcomes Short Term Goal: Understand basic principles of dietary content, such as calories, fat, sodium, cholesterol and nutrients.;Short Term Goal: A plan has been developed with personal nutrition goals set during dietitian appointment.;Long Term Goal: Adherence to prescribed nutrition plan.              Nutrition Assessments:  MEDIFICTS Score Key: >=70 Need to make dietary changes  40-70 Heart Healthy Diet <= 40 Therapeutic Level Cholesterol Diet  Flowsheet Row Cardiac Rehab from [facility]/14/2023 in [facility]  Picture Your Plate Total Score on Admission 49      Picture Your Plate Scores: <78 Unhealthy dietary pattern with much room for improvement. 41-50 Dietary pattern unlikely to meet recommendations for good health and room for improvement. 51-60 More healthful dietary pattern, with some room for improvement.  >60 Healthy dietary pattern, although there may be some specific behaviors that could be improved.    Nutrition Goals Re-Evaluation:  Nutrition Goals Re-Evaluation     Row Name 02/10/23 1344             Goals   Comment Joshua Lara has been tyring to incoporate what he learned from his RD appt into his normal routine. He has noticed that since starting ozempic, he finds himself not eating his usual sized portions. He brings snacks with him in case his sugar gets low. He wants to try to continue working on his sodium and protein intake       Expected Outcome Short: read food labels and plan meals out for the day. Long: independently manage a heart healthy diet.                Nutrition Goals Discharge (Final Nutrition Goals Re-Evaluation):  Nutrition Goals Re-Evaluation - 02/10/23 1344       Goals   Comment Joshua Lara has been tyring to incoporate what he learned from his RD appt into his normal routine. He has noticed that since starting ozempic, he finds himself not eating his usual sized portions. He brings snacks with him in case his sugar gets low. He wants to try to continue working on his sodium and protein intake    Expected Outcome Short: read food labels and plan meals out for the day. Long: independently manage a heart healthy diet.             Psychosocial: Target Goals: Acknowledge presence or absence of significant  depression and/or stress, maximize coping skills, provide positive support system. Participant is able to verbalize types and ability to use techniques and skills needed for reducing stress and depression.   Education: Stress, Anxiety, and Depression - Group verbal and visual presentation to define topics covered.  Reviews how body is impacted by stress, anxiety, and depression.  Also discusses healthy ways to reduce stress and to treat/manage anxiety and depression.  Written material given at graduation.   Education: Sleep Hygiene -Provides group verbal and written instruction about how sleep can affect your health.  Define sleep hygiene, discuss sleep cycles and impact of sleep habits. Review good sleep hygiene tips.    Initial Review & Psychosocial Screening:  Initial Psych Review & Screening - 12/31/22 1338       Initial Review   Current issues with None Identified      Family Dynamics   Good Support System? Yes   children     Barriers   Psychosocial barriers to participate  in program There are no identifiable barriers or psychosocial needs.      Screening Interventions   Interventions Encouraged to exercise;To provide support and resources with identified psychosocial needs;Provide feedback about the scores to participant    Expected Outcomes Short Term goal: Utilizing psychosocial counselor, staff and physician to assist with identification of specific Stressors or current issues interfering with healing process. Setting desired goal for each stressor or current issue identified.;Long Term Goal: Stressors or current issues are controlled or eliminated.;Short Term goal: Identification and review with participant of any Quality of Life or Depression concerns found by scoring the questionnaire.;Long Term goal: The participant improves quality of Life and PHQ9 Scores as seen by post scores and/or verbalization of changes             Quality of Life Scores:   Quality of Life -  01/14/23 1552       Quality of Life   Select Quality of Life      Quality of Life Scores   Health/Function Pre 9.97 %    Socioeconomic Pre 19.93 %    Psych/Spiritual Pre 15.08 %    Family Pre 16.5 %    GLOBAL Pre 14 %            Scores of 19 and below usually indicate a poorer quality of life in these areas.  A difference of  2-3 points is a clinically meaningful difference.  A difference of 2-3 points in the total score of the Quality of Life Index has been associated with significant improvement in overall quality of life, self-image, physical symptoms, and general health in studies assessing change in quality of life.  PHQ-9: Review Flowsheet       01/14/2023 08/23/2018 01/14/2018 07/12/2015  Depression screen PHQ 2/9  Decreased Interest 2 3 1 1   Down, Depressed, Hopeless 3 3 1  0  PHQ - 2 Score 5 6 2 1   Altered sleeping 2 - - -  Tired, decreased energy 3 - - -  Change in appetite 3 - - -  Feeling bad or failure about yourself  1 - - -  Trouble concentrating 2 - - -  Moving slowly or fidgety/restless 0 - - -  Suicidal thoughts 1 - - -  PHQ-9 Score 17 - - -  Difficult doing work/chores Somewhat difficult - - -    Details           Interpretation of Total Score  Total Score Depression Severity:  1-4 = Minimal depression, 5-9 = Mild depression, 10-14 = Moderate depression, 15-19 = Moderately severe depression, 20-27 = Severe depression   Psychosocial Evaluation and Intervention:  Psychosocial Evaluation - 12/31/22 1351       Psychosocial Evaluation & Interventions   Interventions Encouraged to exercise with the program and follow exercise prescription    Comments Ediie has no bariers to attending the program. He is recently widowed (5 years). HIs children are his support.  His daughter,that works at the hospital) helps him keep his medications straight.  He wants to get back to a healthier exercie routine and work on losing about 60 lbs. 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  He does not feel that he has any stress or depression.    Expected Outcomes STG Joshua Lara attends all scheduled sessions, works on exercise and nutrition goals. LTG Joshua Lara continues to work on exercise and nutrition goals after discharge. USing tools and resources he learned about  in program    Continue Psychosocial Services  Follow up required by staff             Psychosocial Re-Evaluation:  Psychosocial Re-Evaluation     Row Name 02/10/23 1341             Psychosocial Re-Evaluation   Current issues with Current Stress Concerns       Comments Joshua Lara reports his current stress concern is financial. He had to pay a lot of money to the government over the last few months. His fridge also gave out this past weekend and he had to pay to replace it. 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 He switched to weekends because it isn't as busy as during the week. He is trying to get up and move more when he is at home and at work, so he is setting an alarm almost every hour to remind himself to get up and take some laps. He enjoys the program so far and is looking forward to continuing to attend. He is grateful for his kids and support system for helping look after him and he is motivated to keep his independence.       Expected Outcomes Short: continue coming to cardiac rehab consistently. Long: graduate from the program.       Interventions Encouraged to attend Cardiac Rehabilitation for the exercise       Continue Psychosocial Services  Follow up required by staff                Psychosocial Discharge (Final Psychosocial Re-Evaluation):  Psychosocial Re-Evaluation - 02/10/23 1341       Psychosocial Re-Evaluation   Current issues with Current Stress Concerns    Comments Joshua Lara reports his current stress concern is financial. He had to pay a lot of money to the government over the last few months. His fridge also gave out this  past weekend and he had to pay to replace it. 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 He switched to weekends because it isn't as busy as during the week. He is trying to get up and move more when he is at home and at work, so he is setting an alarm almost every hour to remind himself to get up and take some laps. He enjoys the program so far and is looking forward to continuing to attend. He is grateful for his kids and support system for helping look after him and he is motivated to keep his independence.    Expected Outcomes Short: continue coming to cardiac rehab consistently. Long: graduate from the program.    Interventions Encouraged to attend Cardiac Rehabilitation for the exercise    Continue Psychosocial Services  Follow up required by staff             Vocational Rehabilitation: Provide vocational rehab assistance to qualifying candidates.   Vocational Rehab Evaluation & Intervention:  Vocational Rehab - 12/31/22 1343       Initial Vocational Rehab Evaluation & Intervention   Assessment shows need for Vocational Rehabilitation No      Vocational Rehab Re-Evaulation   Comments does work part time on weekend             Education: Education Goals: Education classes will be provided on a variety of topics geared toward better understanding of heart health and risk factor modification. Participant will state understanding/return demonstration of  topics presented as noted by education test scores.  Learning Barriers/Preferences:   General Cardiac Education Topics:  AED/CPR: - Group verbal and written instruction with the use of models to demonstrate the basic use of the AED with the basic ABC's of resuscitation.   Anatomy and Cardiac Procedures: - Group verbal and visual presentation and models provide information about basic cardiac anatomy and function. Reviews the testing methods done to diagnose heart disease and the outcomes of  the test results. Describes the treatment choices: Medical Management, Angioplasty, or Coronary Bypass Surgery for treating various heart conditions including Myocardial Infarction, Angina, Valve Disease, and Cardiac Arrhythmias.  Written material given at graduation. Flowsheet Row Cardiac Rehab from 01/14/2023 in [facility] Cardiac and Pulmonary Rehab  Education need identified 01/14/23       Medication Safety: - Group verbal and visual instruction to review commonly prescribed medications for heart and lung disease. Reviews the medication, class of the drug, and side effects. Includes the steps to properly store meds and maintain the prescription regimen.  Written material given at graduation.   Intimacy: - Group verbal instruction through game format to discuss how heart and lung disease can affect sexual intimacy. Written material given at graduation..   Know Your Numbers and Heart Failure: - Group verbal and visual instruction to discuss disease risk factors for cardiac and pulmonary disease and treatment options.  Reviews associated critical values for Overweight/Obesity, Hypertension, Cholesterol, and Diabetes.  Discusses basics of heart failure: signs/symptoms and treatments.  Introduces Heart Failure Zone chart for action plan for heart failure.  Written material given at graduation. Flowsheet Row Cardiac Rehab from 01/14/2023 in [facility] Cardiac and Pulmonary Rehab  Education need identified 01/14/23       Infection Prevention: - Provides verbal and written material to individual with discussion of infection control including proper hand washing and proper equipment cleaning during exercise session. Flowsheet Row Cardiac Rehab from 01/14/2023 in [facility] Cardiac and Pulmonary Rehab  Date 01/14/23  Educator MB  Instruction Review Code 1- Verbalizes Understanding       Falls Prevention: - Provides verbal and written material to individual with discussion of falls prevention and  safety. Flowsheet Row Cardiac Rehab from 01/14/2023 in [facility] Cardiac and Pulmonary Rehab  Date 01/14/23  Educator MB  Instruction Review Code 1- Verbalizes Understanding       Other: -Provides group and verbal instruction on various topics (see comments)   Knowledge Questionnaire Score:  Knowledge Questionnaire Score - 01/14/23 1555       Knowledge Questionnaire Score   Pre Score 20/26             Core Components/Risk Factors/Patient Goals at Admission:  Personal Goals and Risk Factors at Admission - 01/14/23 1555       Core Components/Risk Factors/Patient Goals on Admission    Weight Management Yes;Weight Loss    Intervention Weight Management: Develop a combined nutrition and exercise program designed to reach desired caloric intake, while maintaining appropriate intake of nutrient and fiber, sodium and fats, and appropriate energy expenditure required for the weight goal.;Weight Management: Provide education and appropriate resources to help participant work on and attain dietary goals.;Weight Management/Obesity: Establish reasonable short term and long term weight goals.    Admit Weight 262 lb (118.8 kg)    Goal Weight: Short Term 260 lb (117.9 kg)    Goal Weight: Long Term 200 lb (90.7 kg)    Expected Outcomes Short Term: Continue to assess and modify interventions until short term weight  is achieved;Long Term: Adherence to nutrition and physical activity/exercise program aimed toward attainment of established weight goal;Weight Loss: Understanding of general recommendations for a balanced deficit meal plan, which promotes 1-2 lb weight loss per week and includes a negative energy balance of [phone] kcal/d;Understanding recommendations for meals to include 15-35% energy as protein, 25-35% energy from fat, 35-60% energy from carbohydrates, less than 200mg  of dietary cholesterol, 20-35 gm of total fiber daily;Understanding of distribution of calorie intake throughout the day with  the consumption of 4-5 meals/snacks    Diabetes Yes    Intervention Provide education about signs/symptoms and action to take for hypo/hyperglycemia.;Provide education about proper nutrition, including hydration, and aerobic/resistive exercise prescription along with prescribed medications to achieve blood glucose in normal ranges: Fasting glucose 65-99 mg/dL    Expected Outcomes Short Term: Participant verbalizes understanding of the signs/symptoms and immediate care of hyper/hypoglycemia, proper foot care and importance of medication, aerobic/resistive exercise and nutrition plan for blood glucose control.;Long Term: Attainment of HbA1C < 7%.    Hypertension Yes    Intervention Provide education on lifestyle modifcations including regular physical activity/exercise, weight management, moderate sodium restriction and increased consumption of fresh fruit, vegetables, and low fat dairy, alcohol moderation, and smoking cessation.;Monitor prescription use compliance.    Expected Outcomes Short Term: Continued assessment and intervention until BP is < 140/45mm HG in hypertensive participants. < 130/56mm HG in hypertensive participants with diabetes, heart failure or chronic kidney disease.;Long Term: Maintenance of blood pressure at goal levels.    Lipids Yes    Intervention Provide education and support for participant on nutrition & aerobic/resistive exercise along with prescribed medications to achieve LDL 70mg , HDL >40mg .    Expected Outcomes Short Term: Participant states understanding of desired cholesterol values and is compliant with medications prescribed. Participant is following exercise prescription and nutrition guidelines.;Long Term: Cholesterol controlled with medications as prescribed, with individualized exercise RX and with personalized nutrition plan. Value goals: LDL < 70mg , HDL > 40 mg.             Education:Diabetes - Individual verbal and written instruction to review  signs/symptoms of diabetes, desired ranges of glucose level fasting, after meals and with exercise. Acknowledge that pre and post exercise glucose checks will be done for 3 sessions at entry of program. Flowsheet Row Cardiac Rehab from 01/14/2023 in [facility] Cardiac and Pulmonary Rehab  Date 01/14/23  Educator MB  Instruction Review Code 1- Verbalizes Understanding       Core Components/Risk Factors/Patient Goals Review:   Goals and Risk Factor Review     Row Name 02/10/23 1249             Core Components/Risk Factors/Patient Goals Review   Personal Goals Review Diabetes;Hypertension;Lipids       Review Joshua Lara has enjoyed the program the few weeks he has been here. He feels encouraged to start being more mobile. He recently read an article about how important movement is so he has been setting an alarm on his phone to take a couple laps around the house every hour. He can tell he is already starting to feel better between more exercise in the program and paying more attention to what he eats. He has noticed his sugar has been lower at times than usual, so he is using his libra monitor and paying closer attention to his how many units of insulin he is giving himself. He also started ozempic recently so he is aware of how this will change his eating habits  and CBG readings. He using his Repatha correctly and states he is trying to monitor his cholesterol and wants to keep exercising to help.       Expected Outcomes Short: attend cardiac rehab for education on cholesterol management and continue closely monitoring his CBG readings. Long: independently manage risk factors.                Core Components/Risk Factors/Patient Goals at Discharge (Final Review):   Goals and Risk Factor Review - 02/10/23 1249       Core Components/Risk Factors/Patient Goals Review   Personal Goals Review Diabetes;Hypertension;Lipids    Review Joshua Lara has enjoyed the program the few weeks he has been here. He feels  encouraged to start being more mobile. He recently read an article about how important movement is so he has been setting an alarm on his phone to take a couple laps around the house every hour. He can tell he is already starting to feel better between more exercise in the program and paying more attention to what he eats. He has noticed his sugar has been lower at times than usual, so he is using his libra monitor and paying closer attention to his how many units of insulin he is giving himself. He also started ozempic recently so he is aware of how this will change his eating habits and CBG readings. He using his Repatha correctly and states he is trying to monitor his cholesterol and wants to keep exercising to help.    Expected Outcomes Short: attend cardiac rehab for education on cholesterol management and continue closely monitoring his CBG readings. Long: independently manage risk factors.             ITP Comments:  ITP Comments     Row Name 12/31/22 1356 01/14/23 1541 01/27/23 1159 02/11/23 1225 03/11/23 1416   ITP Comments Virtual orientation call completed today. he has an appointment on Date: 01/14/2023  for EP eval and gym Orientation.  Documentation of diagnosis can be found in [facility] 12/06/2022 . Completed and gym orientation. Initial ITP created and sent for review to Bethann Punches, Medical Director. First full day of exercise!  Patient was oriented to gym and equipment including functions, settings, policies, and procedures.  Patient's individual exercise prescription and treatment plan were reviewed.  All starting workloads were established based on the results of the 6 minute walk test done at initial orientation visit.  The plan for exercise progression was also introduced and progression will be customized based on patient's performance and goals. 30 Day review completed. Medical Director ITP review done, changes made as directed, and signed approval by Medical Director.    new to  program 30 Day review completed. Medical Director ITP review done, changes made as directed, and signed approval by Medical Director.    Row Name 03/18/23 1412 03/30/23 1710 03/31/23 1507 04/01/23 1016 04/20/23 1521   ITP Comments Joshua Lara has been placed on medical hold while he sees a back specialist. He has been calling us to keep [place] informed. Joshua Lara will let us know after he sees specialist for the plan moving forward. Called to check on pt. He is still on medical hold while seeing back specialist. No answer at this time. Will try to reach pt at a later time. Joshua Lara called to inform staff he is still waiting on his MRI and then follow up appointment with his MD. He still wishes to be placed on medical hold a this time and hopes  to have a better timeline of his hopeful return very soon. 30 Day review completed. Medical Director ITP review done, changes made as directed, and signed approval by Medical Director.    medical hold at this time Called to follow up with pt. He has his MRI scheduled for 12/17. He is still discussing plan with back specialist and is unable to resume rehab at this time. Discussed early discharge at this time and pt understands if he is able to return at a later time after he is recovered then his provider may send [place] a new referral. Joshua Lara appreciative and voiced understanding. Will discharge at this time from rehab.            Comments: Discharge ITP

## 2023-04-20 NOTE — Telephone Encounter (Signed)
Attempted phone call to pt and left voicemail message to contact office at [phone]. 

## 2023-04-20 NOTE — Telephone Encounter (Signed)
STAT if HR is under 50 or over 120 (normal HR is 60-100 beats per minute)  What is your heart rate? 120  Do you have a log of your heart rate readings (document readings)? 92, 100  Do you have any other symptoms? Lightheaded

## 2023-04-20 NOTE — Telephone Encounter (Signed)
Called to follow up with pt. He has his MRI scheduled for 12/17. He is still discussing plan with back specialist and is unable to resume rehab at this time. Discussed early discharge at this time and pt understands if he is able to return at a later time after he is recovered then his provider may send [place] a new referral. Ed appreciative and voiced understanding. Will discharge at this time from rehab.

## 2023-04-22 ENCOUNTER — Telehealth: Payer: Self-pay | Admitting: Cardiology

## 2023-04-22 ENCOUNTER — Telehealth: Payer: Self-pay | Admitting: Pulmonary Disease

## 2023-04-22 DIAGNOSIS — G4733 Obstructive sleep apnea (adult) (pediatric): Secondary | ICD-10-CM

## 2023-04-22 NOTE — Telephone Encounter (Signed)
PT just started Dr. Judeth Horn. He was  seeing a  Dr. Delbert Phenix. His CPAP machine has a "exceeding motor life" warning on it and he wonders if Dr. Rexene Edison can order him a new CPAP from [facility]. Please call him @ [phone]

## 2023-04-22 NOTE — Telephone Encounter (Signed)
*  STAT* If patient is at the pharmacy, call can be transferred to refill team.   1. Which medications need to be refilled? (please list name of each medication and dose if known) Semaglutide,0.25 or 0.5MG /DOS, (OZEMPIC, 0.25 OR 0.5 MG/DOSE,) 2 MG/3ML SOPN    4. Which pharmacy/location (including street and city if local pharmacy) is medication to be sent to? [id number] - [place], [place] - [address] [address] [place] [address] [facility]     5. Do they need a 30 day or 90 day supply? 90   Pt is schedule for 06/16/23 with Dr. Odis Hollingshead

## 2023-04-23 NOTE — Telephone Encounter (Signed)
Spoke to patient ready to increase Ozempic dose to 1 mg once a week. He is aware how to adjust insulin dose as per his BG so will work on that as Ozempic dose increases.

## 2023-04-23 NOTE — Telephone Encounter (Signed)
Taken care of in another encounter 

## 2023-04-27 ENCOUNTER — Telehealth: Payer: Self-pay

## 2023-04-27 NOTE — Telephone Encounter (Signed)
Spoke with pt regarding encounter from last week. Pt stated he realized after calling in that his lightheadedness was a side effect from his recent dose increase of Gabapentin prescribed by his PCP and it has gotten better. Pt stated his heart rate was only 120 that one day for a short period of time and since then it has remained in the 80s. Pt advised to call PCP is the lightheadedness continues and to call us again if his heart rate starts to increase and sustain. Pt verbalized understanding and had no further questions at this time.

## 2023-04-28 ENCOUNTER — Ambulatory Visit
Admission: RE | Admit: 2023-04-28 | Discharge: 2023-04-28 | Disposition: A | Discharge status: Home / Self Care | Payer: PPO | Source: Ambulatory Visit | Attending: Neurological Surgery

## 2023-04-28 DIAGNOSIS — M47816 Spondylosis without myelopathy or radiculopathy, lumbar region: Secondary | ICD-10-CM | POA: Diagnosis not present

## 2023-04-28 DIAGNOSIS — M5416 Radiculopathy, lumbar region: Secondary | ICD-10-CM

## 2023-04-28 NOTE — Telephone Encounter (Signed)
Ok to order new CPAP - thanks!

## 2023-04-28 NOTE — Telephone Encounter (Signed)
Resending High Priority. 44 days old.

## 2023-04-28 NOTE — Telephone Encounter (Signed)
Last OV 02/12/23 which was 1st visit. Patient is requesting a new cpap be ordered as his current machine is giving a message exceeding motor life? Are you ok to order cpap machine?

## 2023-04-28 NOTE — Telephone Encounter (Signed)
Called and spoke with patient, advised that an order will be sent to [facility] for a new CPAP machine.  I let him know that once it is approved by insurance that the DME company will call him. He verbalized understanding.  Nothing further needed.

## 2023-04-28 NOTE — Telephone Encounter (Signed)
Called [facility] to get tagged in his [employer] account.  Psychiatric nurse and discovered patient is on a Bipap.  Spoke with Dr. Judeth Horn and he is fine with ordering a new bipap machine with his same settings:  Max IPAP:  25 cm H20, Min EPAP: 5 cm H20 and pressure support: 4 cm H20.  New bipap order sent to [facility].  Nothing further needed.

## 2023-05-07 ENCOUNTER — Other Ambulatory Visit: Payer: Self-pay | Admitting: Cardiology

## 2023-05-07 DIAGNOSIS — I5033 Acute on chronic diastolic (congestive) heart failure: Secondary | ICD-10-CM

## 2023-05-07 DIAGNOSIS — I2510 Atherosclerotic heart disease of native coronary artery without angina pectoris: Secondary | ICD-10-CM

## 2023-05-07 DIAGNOSIS — I252 Old myocardial infarction: Secondary | ICD-10-CM

## 2023-05-07 DIAGNOSIS — Z955 Presence of coronary angioplasty implant and graft: Secondary | ICD-10-CM

## 2023-05-07 DIAGNOSIS — M0609 Rheumatoid arthritis without rheumatoid factor, multiple sites: Secondary | ICD-10-CM | POA: Diagnosis not present

## 2023-05-07 NOTE — Telephone Encounter (Signed)
Prescription refill request for Eliquis received. Indication: Afib  Last office visit: 01/19/23 Odis Hollingshead)  Scr: 1.04 (12/12/22)  Age: 77 Weight: 121.9kg  Appropriate dose. Refill sent.

## 2023-05-11 DIAGNOSIS — J449 Chronic obstructive pulmonary disease, unspecified: Secondary | ICD-10-CM | POA: Diagnosis not present

## 2023-05-20 ENCOUNTER — Encounter: Payer: Self-pay | Admitting: Pulmonary Disease

## 2023-05-20 ENCOUNTER — Ambulatory Visit: Payer: PPO | Admitting: Pulmonary Disease

## 2023-05-20 VITALS — BP 110/70 | HR 104 | Ht 69.5 in | Wt 259.4 lb

## 2023-05-20 DIAGNOSIS — R053 Chronic cough: Secondary | ICD-10-CM | POA: Diagnosis not present

## 2023-05-20 DIAGNOSIS — Z6837 Body mass index (BMI) 37.0-37.9, adult: Secondary | ICD-10-CM | POA: Diagnosis not present

## 2023-05-20 DIAGNOSIS — I950 Idiopathic hypotension: Secondary | ICD-10-CM | POA: Diagnosis not present

## 2023-05-20 DIAGNOSIS — Z79899 Other long term (current) drug therapy: Secondary | ICD-10-CM | POA: Diagnosis not present

## 2023-05-20 DIAGNOSIS — M1A09X0 Idiopathic chronic gout, multiple sites, without tophus (tophi): Secondary | ICD-10-CM | POA: Diagnosis not present

## 2023-05-20 DIAGNOSIS — E669 Obesity, unspecified: Secondary | ICD-10-CM | POA: Diagnosis not present

## 2023-05-20 DIAGNOSIS — G4733 Obstructive sleep apnea (adult) (pediatric): Secondary | ICD-10-CM | POA: Diagnosis not present

## 2023-05-20 DIAGNOSIS — M0609 Rheumatoid arthritis without rheumatoid factor, multiple sites: Secondary | ICD-10-CM | POA: Diagnosis not present
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
## 2023-05-20 NOTE — Patient Instructions (Signed)
 Nice to see you again  Will use no inhalers given your symptoms seem minimal  Continue your BiPAP let me know if any issues with delivery of the new machine  Return to clinic in 6 months or sooner as needed with Dr. Judeth Horn

## 2023-05-21 ENCOUNTER — Other Ambulatory Visit: Payer: Self-pay | Admitting: Cardiology

## 2023-05-21 DIAGNOSIS — E781 Pure hyperglyceridemia: Secondary | ICD-10-CM

## 2023-05-23 ENCOUNTER — Other Ambulatory Visit: Payer: Self-pay | Admitting: Cardiology

## 2023-05-23 DIAGNOSIS — I2510 Atherosclerotic heart disease of native coronary artery without angina pectoris: Secondary | ICD-10-CM

## 2023-05-23 DIAGNOSIS — I252 Old myocardial infarction: Secondary | ICD-10-CM

## 2023-05-23 DIAGNOSIS — Z955 Presence of coronary angioplasty implant and graft: Secondary | ICD-10-CM

## 2023-05-24 ENCOUNTER — Other Ambulatory Visit: Payer: Self-pay | Admitting: Cardiology

## 2023-05-25 ENCOUNTER — Encounter: Payer: Self-pay | Admitting: Pharmacist

## 2023-05-29 DIAGNOSIS — G4733 Obstructive sleep apnea (adult) (pediatric): Secondary | ICD-10-CM | POA: Diagnosis not present

## 2023-06-11 ENCOUNTER — Other Ambulatory Visit: Payer: Self-pay | Admitting: Cardiology

## 2023-06-11 DIAGNOSIS — J449 Chronic obstructive pulmonary disease, unspecified: Secondary | ICD-10-CM | POA: Diagnosis not present

## 2023-06-11 DIAGNOSIS — E7800 Pure hypercholesterolemia, unspecified: Secondary | ICD-10-CM

## 2023-06-11 DIAGNOSIS — E119 Type 2 diabetes mellitus without complications: Secondary | ICD-10-CM

## 2023-06-11 DIAGNOSIS — I5022 Chronic systolic (congestive) heart failure: Secondary | ICD-10-CM

## 2023-06-11 DIAGNOSIS — Z789 Other specified health status: Secondary | ICD-10-CM

## 2023-06-12 DIAGNOSIS — K115 Sialolithiasis: Secondary | ICD-10-CM | POA: Diagnosis not present

## 2023-06-15 DIAGNOSIS — Z6837 Body mass index (BMI) 37.0-37.9, adult: Secondary | ICD-10-CM | POA: Diagnosis not present

## 2023-06-15 DIAGNOSIS — M5416 Radiculopathy, lumbar region: Secondary | ICD-10-CM | POA: Diagnosis not present

## 2023-06-16 ENCOUNTER — Ambulatory Visit: Payer: PPO | Admitting: Cardiology

## 2023-06-29 DIAGNOSIS — H409 Unspecified glaucoma: Secondary | ICD-10-CM | POA: Diagnosis not present

## 2023-06-29 DIAGNOSIS — Z794 Long term (current) use of insulin: Secondary | ICD-10-CM | POA: Diagnosis not present

## 2023-06-29 DIAGNOSIS — E039 Hypothyroidism, unspecified: Secondary | ICD-10-CM | POA: Diagnosis not present

## 2023-06-29 DIAGNOSIS — E1142 Type 2 diabetes mellitus with diabetic polyneuropathy: Secondary | ICD-10-CM | POA: Diagnosis not present

## 2023-06-29 DIAGNOSIS — G4733 Obstructive sleep apnea (adult) (pediatric): Secondary | ICD-10-CM | POA: Diagnosis not present

## 2023-06-29 DIAGNOSIS — E1136 Type 2 diabetes mellitus with diabetic cataract: Secondary | ICD-10-CM | POA: Diagnosis not present

## 2023-07-10 DIAGNOSIS — J449 Chronic obstructive pulmonary disease, unspecified: Secondary | ICD-10-CM | POA: Diagnosis not present

## 2023-07-15 DIAGNOSIS — I480 Paroxysmal atrial fibrillation: Secondary | ICD-10-CM | POA: Diagnosis not present

## 2023-07-15 DIAGNOSIS — M069 Rheumatoid arthritis, unspecified: Secondary | ICD-10-CM | POA: Diagnosis not present

## 2023-07-15 DIAGNOSIS — G4733 Obstructive sleep apnea (adult) (pediatric): Secondary | ICD-10-CM | POA: Diagnosis not present

## 2023-07-15 DIAGNOSIS — I5032 Chronic diastolic (congestive) heart failure: Secondary | ICD-10-CM | POA: Diagnosis not present

## 2023-07-15 DIAGNOSIS — E039 Hypothyroidism, unspecified: Secondary | ICD-10-CM | POA: Diagnosis not present

## 2023-07-15 DIAGNOSIS — E1169 Type 2 diabetes mellitus with other specified complication: Secondary | ICD-10-CM | POA: Diagnosis not present

## 2023-07-15 DIAGNOSIS — I2510 Atherosclerotic heart disease of native coronary artery without angina pectoris: Secondary | ICD-10-CM | POA: Diagnosis not present

## 2023-07-15 DIAGNOSIS — J9611 Chronic respiratory failure with hypoxia: Secondary | ICD-10-CM | POA: Diagnosis not present

## 2023-07-15 DIAGNOSIS — K219 Gastro-esophageal reflux disease without esophagitis: Secondary | ICD-10-CM | POA: Diagnosis not present

## 2023-07-15 DIAGNOSIS — Z125 Encounter for screening for malignant neoplasm of prostate: Secondary | ICD-10-CM | POA: Diagnosis not present

## 2023-07-15 DIAGNOSIS — N400 Enlarged prostate without lower urinary tract symptoms: Secondary | ICD-10-CM | POA: Diagnosis not present

## 2023-07-15 DIAGNOSIS — I10 Essential (primary) hypertension: Secondary | ICD-10-CM | POA: Diagnosis not present

## 2023-07-15 DIAGNOSIS — Z139 Encounter for screening, unspecified: Secondary | ICD-10-CM | POA: Diagnosis not present

## 2023-07-22 ENCOUNTER — Telehealth: Payer: Self-pay | Admitting: Pharmacy Technician

## 2023-07-22 ENCOUNTER — Other Ambulatory Visit (HOSPITAL_COMMUNITY): Payer: Self-pay

## 2023-07-22 NOTE — Telephone Encounter (Signed)
 Pharmacy Patient Advocate Encounter   Received notification from CoverMyMeds that prior authorization for Ozempic is required/requested.   Insurance verification completed.   The patient is insured through [facility] ADVANTAGE/RX ADVANCE .   Per test claim: PA required; PA submitted to above mentioned insurance via CoverMyMeds Key/confirmation #/EOC [id number] Status is pending

## 2023-07-22 NOTE — Telephone Encounter (Signed)
 Pharmacy Patient Advocate Encounter  Received notification from [facility] ADVANTAGE/RX ADVANCE that Prior Authorization for Ozempic has been APPROVED from 07/22/23 to 07/21/24   PA #/Case ID/Reference #: [id number]

## 2023-07-27 DIAGNOSIS — G4733 Obstructive sleep apnea (adult) (pediatric): Secondary | ICD-10-CM | POA: Diagnosis not present

## 2023-08-03 ENCOUNTER — Encounter: Payer: Self-pay | Admitting: Cardiology

## 2023-08-03 ENCOUNTER — Other Ambulatory Visit: Payer: Self-pay

## 2023-08-03 ENCOUNTER — Ambulatory Visit: Payer: PPO | Attending: Cardiology | Admitting: Cardiology

## 2023-08-03 VITALS — BP 98/62 | HR 108 | Resp 16 | Ht 69.0 in | Wt 253.8 lb

## 2023-08-03 DIAGNOSIS — E781 Pure hyperglyceridemia: Secondary | ICD-10-CM | POA: Diagnosis not present

## 2023-08-03 DIAGNOSIS — I7121 Aneurysm of the ascending aorta, without rupture: Secondary | ICD-10-CM

## 2023-08-03 DIAGNOSIS — I252 Old myocardial infarction: Secondary | ICD-10-CM

## 2023-08-03 DIAGNOSIS — E119 Type 2 diabetes mellitus without complications: Secondary | ICD-10-CM | POA: Diagnosis not present

## 2023-08-03 DIAGNOSIS — Z87891 Personal history of nicotine dependence: Secondary | ICD-10-CM

## 2023-08-03 DIAGNOSIS — Z789 Other specified health status: Secondary | ICD-10-CM

## 2023-08-03 DIAGNOSIS — Z7901 Long term (current) use of anticoagulants: Secondary | ICD-10-CM | POA: Diagnosis not present

## 2023-08-03 DIAGNOSIS — Z794 Long term (current) use of insulin: Secondary | ICD-10-CM

## 2023-08-03 DIAGNOSIS — E7800 Pure hypercholesterolemia, unspecified: Secondary | ICD-10-CM | POA: Diagnosis not present

## 2023-08-03 DIAGNOSIS — G4733 Obstructive sleep apnea (adult) (pediatric): Secondary | ICD-10-CM

## 2023-08-03 DIAGNOSIS — R0609 Other forms of dyspnea: Secondary | ICD-10-CM | POA: Diagnosis not present

## 2023-08-03 DIAGNOSIS — Z955 Presence of coronary angioplasty implant and graft: Secondary | ICD-10-CM

## 2023-08-03 DIAGNOSIS — Z79899 Other long term (current) drug therapy: Secondary | ICD-10-CM

## 2023-08-03 DIAGNOSIS — I5032 Chronic diastolic (congestive) heart failure: Secondary | ICD-10-CM

## 2023-08-03 DIAGNOSIS — I2510 Atherosclerotic heart disease of native coronary artery without angina pectoris: Secondary | ICD-10-CM | POA: Diagnosis not present

## 2023-08-03 DIAGNOSIS — I480 Paroxysmal atrial fibrillation: Secondary | ICD-10-CM

## 2023-08-03 MED ORDER — SEMAGLUTIDE (1 MG/DOSE) 4 MG/3ML ~~LOC~~ SOPN: 1.0000 mg | PEN_INJECTOR | SUBCUTANEOUS | 0 refills | Status: DC

## 2023-08-03 NOTE — Progress Notes (Signed)
 Cardiology Office Note:  .   Date:  08/03/2023  ID:  Joshua Lara, DOB 1946/04/24, MRN [id number] PCP:  Lonie Peak, PA-C  Former Cardiology Providers: Altamese Dayton, APRN, FNP-C  [facility] Providers Cardiologist:  Tessa Lerner, DO , [facility] (established care 10/28/2019) Electrophysiologist:  None  Click to update primary MD,subspecialty MD or APP then REFRESH:1}    Chief Complaint  Patient presents with   Atherosclerosis of native coronary artery of native heart w   Follow-up    History of Present Illness: .   Joshua Lara is a 78 y.o. Caucasian male whose past medical history and cardiovascular risk factors includes: Acute inferolateral STEMI status post thrombectomy and stenting (December 07, 2022), Paroxysmal atrial fibrillation (status post cardioversion July 2024), heart failure with improved EF, chronic HFpEF, ascending aortic aneurysm 42 mm (CT Chest 07/04/2022), hypertension, hyperlipidemia, OSA on BiPAP, diabetes mellitus, glaucoma, obesity due to excess calories, advanced age.   Patient is being followed by the practice for HFpEF, A-fib, CAD status post STEMI July 2024, hyperlipidemia with statin intolerance.  In July 2024 he presented to [facility] and noted to have inferolateral STEMI on presentation.  Taken to the Cath Lab emergently underwent aspiration thrombectomy followed by direct stenting.  Since then we have been focusing on improving his modifiable cardiovascular risk factors.  Patient presents today for 8-month follow-up visit.  Since last office visit patient has lost approximately 22 pounds likely secondary to up titration of Ozempic.  However at the current dose patient states that he is having symptoms related to gastrointestinal including persistent diarrhea, excessive gas, burping/belching.  He is requesting a reduced dose of Ozempic.  At times he does have some precordial discomfort which she describes as ache like sensation, more pronounced  after eating, feels like indigestion.  Suspect this is secondary to Ozempic as well.  He recently had a heart catheterization results mentioned below for reference.  He also checks his blood pressure on a regular basis and they usually are 115-120 mmHg.  If he does have soft systolic blood pressures as discussed above he does not take his losartan.  He also endorses episodes of palpitations which are short-lived not sure if he is in atrial fibrillation or underlying tachycardia.  He has no way of monitoring in an ambulatory setting.  Patient is accompanied by his daughter at today's office visit who also provides collateral history.  Review of Systems: .   Review of Systems  Constitutional: Positive for weight loss (22 pound loss).  Cardiovascular:  Positive for dyspnea on exertion and palpitations. Negative for chest pain, claudication, irregular heartbeat, leg swelling, near-syncope, orthopnea, paroxysmal nocturnal dyspnea and syncope.  Respiratory:  Negative for shortness of breath.   Hematologic/Lymphatic: Negative for bleeding problem.    Studies Reviewed:   EKG: EKG Interpretation Date/Time:  Monday August 03 2023 13:13:44 EDT Ventricular Rate:  100 PR Interval:  212 QRS Duration:  92 QT Interval:  312 QTC Calculation: 402 R Axis:   105  Text Interpretation: Sinus rhythm with 1st degree A-V block Rightward axis Low voltage QRS Cannot rule out Anterior infarct (cited on or before 07-Dec-2022) When compared with ECG of 07-Dec-2022 07:14, No significant change since last tracing Confirmed by Tessa Lerner [phone]) on 08/03/2023 1:15:26 PM  Echocardiogram: 01/14/2019: LVEF 45-50%, calculated LVEF 40%, grade 1 diastolic impairment, mild LAE, see report for details.   11/2022 1. Left ventricular ejection fraction, by estimation, is 55 to 60%. The left ventricle has  normal function. The left ventricle has no regional wall motion abnormalities. There is mild left ventricular hypertrophy. Left  ventricular diastolic parameters  are consistent with Grade III diastolic dysfunction (restrictive). Elevated left ventricular end-diastolic pressure.  2. Right ventricular systolic function is normal. The right ventricular size is normal.  3. The mitral valve is normal in structure. No evidence of mitral valve regurgitation. No evidence of mitral stenosis.  4. The aortic valve is grossly normal. Aortic valve regurgitation is not visualized. Aortic valve sclerosis is present, with no evidence of aortic valve stenosis.  5. Aortic dilatation noted. There is dilatation of the ascending aorta, measuring 39 mm. There is dilatation of the aortic root, measuring 43 mm.  6. The inferior vena cava is normal in size with greater than 50% respiratory variability, suggesting right atrial pressure of 3 mmHg.  Left Heart Catheterization 12/07/22:  LV: 109/19, EDP 27 mmHg.  Ao: 1 6/61, mean 76 mmHg.  No pressure gradient across the aortic valve on 10 mcg/kg/min of dopamine. LM: Large-caliber vessel.  Smooth and normal. LAD: Large-caliber vessel, gives origin to large D1.  Smooth and normal. Ramus intermediate: Moderate caliber vessel.  Smooth and normal. CX: Moderate caliber vessel.  Smooth and normal. RCA: Large-caliber vessel, gives origin to a large PDA and 2 small PL branches originating close by to each other, there is an ulcerated 100% occlusion of the distal right coronary artery.    Intervention data: Successful thrombectomy with Pronto aspiration followed by direct stenting with 3.5 x 24 mm Synergy XD DES at 13 atmospheric pressure, stenosis reduced from 100% to 0% TIMI-0 flow improved to TIMI-3 flow.  RADIOLOGY: NA  Risk Assessment/Calculations:   NA   Labs:       Latest Ref Rng & Units 12/07/2022    4:50 AM 12/07/2022   12:24 AM 12/06/2022   11:34 PM  CBC  WBC 4.0 - 10.5 K/uL 8.3   8.5   Hemoglobin 13.0 - 17.0 g/dL 16.1  09.6  04.5   Hematocrit 39.0 - 52.0 % 41.3  42.0  42.2   Platelets  150 - 400 K/uL 216   251        Latest Ref Rng & Units 12/12/2022    1:58 PM 12/09/2022    3:34 AM 12/07/2022    4:50 AM  BMP  Glucose 70 - 99 mg/dL 409  811  914   BUN 8 - 27 mg/dL 13  24  14    Creatinine 0.76 - 1.27 mg/dL 7.82  9.56  2.13   BUN/Creat Ratio 10 - 24 13     Sodium 134 - 144 mmol/L 139  136  133   Potassium 3.5 - 5.2 mmol/L 4.1  4.0  3.9   Chloride 96 - 106 mmol/L 102  101  98   CO2 20 - 29 mmol/L 25  26  25    Calcium 8.6 - 10.2 mg/dL 9.7  9.2  9.1       Latest Ref Rng & Units 12/12/2022    1:58 PM 12/09/2022    3:34 AM 12/07/2022    4:50 AM  CMP  Glucose 70 - 99 mg/dL 086  578  469   BUN 8 - 27 mg/dL 13  24  14    Creatinine 0.76 - 1.27 mg/dL 6.29  5.28  4.13   Sodium 134 - 144 mmol/L 139  136  133   Potassium 3.5 - 5.2 mmol/L 4.1  4.0  3.9   Chloride 96 -  106 mmol/L 102  101  98   CO2 20 - 29 mmol/L 25  26  25    Calcium 8.6 - 10.2 mg/dL 9.7  9.2  9.1     Lab Results  Component Value Date   CHOL 93 12/08/2022   HDL 39 (L) 12/08/2022   LDLCALC 30 12/08/2022   LDLDIRECT 35 12/08/2022   TRIG 118 12/08/2022   CHOLHDL 2.4 12/08/2022   Recent Labs    12/07/22 0450 12/08/22 0222  LIPOA <8.4 <8.4   No components found for: "NTPROBNP" Recent Labs    12/12/22 1358  PROBNP 461   No results for input(s): "TSH" in the last 8760 hours.  External Labs: Collected: 06/07/2021 provided by PCP. BUN 11, creatinine 0.9. eGFR 90. Sodium 141, potassium 4, chloride 103, bicarb 27 AST 23, ALT 27, alkaline phosphatase 113 Hemoglobin 16.9 g/dL, hematocrit 16.1% BNP 8.2 Total cholesterol 173, triglycerides 259, HDL 42, LDL 88   External Labs: Collected: 08/13/2022 provided by PCP BUN 16, creatinine 1.08. BUN to creatinine ratio 15. Sodium 135, potassium 4.4, chloride 99, bicarb 23. AST 26, ALT 24, alkaline phosphatase 64 Hemoglobin 15.1, hematocrit 43.9% BNP 4.4  Physical Exam:    Today's Vitals   08/03/23 1309  BP: 98/62  Pulse: (!) 108  Resp: 16  SpO2:  95%  Weight: 253 lb 12.8 oz (115.1 kg)  Height: 5\' 9"  (1.753 m)   Body mass index is 37.48 kg/m. Wt Readings from Last 3 Encounters:  08/03/23 253 lb 12.8 oz (115.1 kg)  05/20/23 259 lb 6.4 oz (117.7 kg)  02/12/23 268 lb 12.8 oz (121.9 kg)    Physical Exam  Constitutional: No distress. He appears chronically ill.  hemodynamically stable  Neck: No JVD present.  Cardiovascular: Regular rhythm, S1 normal and S2 normal. Tachycardia present. Exam reveals no gallop, no S3 and no S4.  No murmur heard. Pulmonary/Chest: Effort normal and breath sounds normal. No stridor. He has no wheezes. He has no rales.  Abdominal: Soft. He exhibits no distension. There is no abdominal tenderness.  Musculoskeletal:        General: No edema.     Cervical back: Neck supple.  Skin: Skin is warm.     Impression & Recommendation(s):  Impression:   ICD-10-CM   1. Atherosclerosis of native coronary artery of native heart without angina pectoris  I25.10 EKG 12-Lead    2. History of ST elevation myocardial infarction (STEMI)  I25.2     3. Coronary stent patent  Z95.5     4. Heart failure with improved ejection fraction (HFimpEF) (HCC)  I50.32     5. Dyspnea on exertion  R06.09     6. Aneurysm of ascending aorta without rupture (HCC)  I71.21     7. Paroxysmal atrial fibrillation (HCC)  I48.0     8. Long term (current) use of anticoagulants  Z79.01     9. Long term current use of antiarrhythmic drug  Z79.899     10. Type 2 diabetes mellitus without complication, with long-term current use of insulin (HCC)  E11.9 Semaglutide, 1 MG/DOSE, 4 MG/3ML SOPN   Z79.4     11. Pure hypercholesterolemia  E78.00     12. Hypertriglyceridemia  E78.1     13. Statin intolerance  Z78.9     14. OSA treated with BiPAP  G47.33     15. Former smoker  Z87.891        Recommendation(s):  Atherosclerosis of native coronary artery of native heart without  angina pectoris History of ST elevation myocardial  infarction (STEMI) Coronary stent patent Denies anginal chest pain. The precordial discomfort that he is experiencing is likely secondary to side effects from Ozempic use. Will reduce Ozempic to 1 mg weekly. Continue Plavix 75 mg p.o. daily, after 1 year of Plavix recommend switching back to aspirin 81 mg p.o. daily to minimize his overall risk for bleeding Continue Repatha. Continue fenofibrate 145 mg p.o. daily. Currently on losartan 25 mg p.o. daily with holding parameters.  Patient states that he barely takes it due to soft blood pressures at baseline No use of sublingual nitroglycerin tablet since last office visit  Heart failure with improved ejection fraction (HFimpEF) (HCC) July 2021 LVEF 45-50% see report for additional details. July 2024: LVEF 55 to 60%, grade 3 diastolic dysfunction, see report for details Uptitration of GDMT has been difficult due to soft blood pressures. Of note Jardiance were discontinued in the past secondary to yeast infection. I have asked him to discuss with his endocrinologist noncardiac causes of hypotension such as adrenal insufficiency/etc.  Dyspnea on exertion Multifactorial. Has undergone extensive cardiovascular workup including echo and heart catheterization. Does follow-up with pulmonary medicine-recommended that he follows up for reevaluation as he remains dyspneic despite 22 pounds of weight loss. Of note he is on methotrexate they do carry pulmonary side effects and should be evaluated.  Aneurysm of ascending aorta without rupture [facility]) CT chest at [facility] February 2024-per report ascending aorta 42 mm. Echocardiogram July 2024-ascending aorta 43 mm. Blood pressures are well-controlled. Was supposed to have a repeat CT of the chest prior to today's office visit but is still pending.  Will coordinate study again. Avoid medications such as Ciprofloxacillin, uncontrolled hypertension, avoid heavy lifting that may increase  intra-abdominal pressures or require Valsalva maneuver.  Paroxysmal atrial fibrillation (HCC) Long term (current) use of anticoagulants Long term current use of antiarrhythmic drug Rate control sotalol. Rhythm control sotalol. Thromboembolic prophylaxis: Eliquis. In the past sotalol has been reduced from 80 mg p.o. twice daily to 40 mg p.o. twice daily due to soft blood pressures. If he continues to have shortness of breath will consider cardiac monitor to reevaluate A-fib burden CHA2DS2-VASc score 6, correlates to 9.7% risk for stroke per year  Type 2 diabetes mellitus without complication, with long-term current use of insulin (HCC) With complications of glaucoma, CHF, CAD Will reduce Ozempic to 1 mg weekly given his symptoms as discussed above. Continue Repatha, fenofibrate, and losartan on as needed basis to avoid hypotension  Pure hypercholesterolemia Hypertriglyceridemia Statin intolerance Currently on Repatha 140 mg subcu injection every 14 days. LDL and triglycerides are not well-controlled. Continue current medical therapy  OSA treated with BiPAP Reemphasized importance of compliance.  Orders Placed:  Orders Placed This Encounter  Procedures   EKG 12-Lead     Final Medication List:    Meds ordered this encounter  Medications   Semaglutide, 1 MG/DOSE, 4 MG/3ML SOPN    Sig: Inject 1 mg into the skin once a week.    Dispense:  3 mL    Refill:  0    Medications Discontinued During This Encounter  Medication Reason   Semaglutide, 2 MG/DOSE, (OZEMPIC, 2 MG/DOSE,) 8 MG/3ML SOPN Dose change     Current Outpatient Medications:    acetaminophen (TYLENOL) 650 MG CR tablet, Take 650 mg by mouth every 8 (eight) hours as needed for pain., Disp: , Rfl:    albuterol (VENTOLIN HFA) 108 (90 Base) MCG/ACT inhaler, Inhale 1-2 puffs  into the lungs every 4 (four) hours as needed for wheezing or shortness of breath., Disp: , Rfl:    amoxicillin-clavulanate (AUGMENTIN) 875-125 MG  tablet, Take 1 tablet by mouth 2 (two) times daily., Disp: 14 tablet, Rfl: 0   apixaban (ELIQUIS) 5 MG TABS tablet, Take 1 tablet (5 mg total) by mouth 2 (two) times daily., Disp: 60 tablet, Rfl: 5   Armodafinil 150 MG tablet, Take 150 mg by mouth daily., Disp: , Rfl:    BD PEN NEEDLE NANO 2ND GEN 32G X 4 MM MISC, , Disp: , Rfl:    clobetasol cream (TEMOVATE) 0.05 %, Apply 1 Application topically as needed (breakouts on left arm)., Disp: , Rfl:    clopidogrel (PLAVIX) 75 MG tablet, TAKE 1 TABLET BY MOUTH EVERY DAY, Disp: 90 tablet, Rfl: 3   Continuous Glucose Sensor (FREESTYLE LIBRE 2 SENSOR) MISC, Inject 1 Device into the skin every 14 (fourteen) days., Disp: , Rfl:    cyanocobalamin (VITAMIN B12) 500 MCG tablet, Take 500 mcg by mouth daily., Disp: , Rfl:    Evolocumab (REPATHA SURECLICK) 140 MG/ML SOAJ, INJECT 140 MG INTO THE SKIN EVERY 14 (FOURTEEN) DAYS., Disp: 6 mL, Rfl: 3   fenofibrate (TRICOR) 145 MG tablet, TAKE 1 TABLET BY MOUTH EVERY DAY, Disp: 90 tablet, Rfl: 2   fluticasone (FLONASE) 50 MCG/ACT nasal spray, Place 1 spray into both nostrils 2 (two) times daily., Disp: , Rfl:    folic acid (FOLVITE) 1 MG tablet, Take 1 mg by mouth daily., Disp: , Rfl:    HUMULIN R U-500 KWIKPEN 500 UNIT/ML kwikpen, Inject 125-150 Units into the skin 2 (two) times daily. , Disp: , Rfl:    latanoprost (XALATAN) 0.005 % ophthalmic solution, Place 1 drop into both eyes in the morning and at bedtime., Disp: , Rfl:    levothyroxine (SYNTHROID, LEVOTHROID) 100 MCG tablet, Take 100 mcg by mouth daily., Disp: , Rfl:    losartan (COZAAR) 25 MG tablet, TAKE 1 TABLET BY MOUTH EVERY EVENING, Disp: 90 tablet, Rfl: 1   methotrexate 2.5 MG tablet, Take 2.5 mg by mouth once a week., Disp: , Rfl:    Multiple Vitamins-Minerals (CENTRUM SILVER PO), Take 1 tablet by mouth daily., Disp: , Rfl:    pantoprazole (PROTONIX) 40 MG tablet, Take 40 mg by mouth daily., Disp: , Rfl:    Semaglutide, 1 MG/DOSE, 4 MG/3ML SOPN, Inject 1  mg into the skin once a week., Disp: 3 mL, Rfl: 0   sotalol (BETAPACE) 80 MG tablet, Take 0.5 tablets (40 mg total) by mouth 2 (two) times daily., Disp: , Rfl:    tamsulosin (FLOMAX) 0.4 MG CAPS capsule, Take 0.4 mg by mouth daily after supper., Disp: , Rfl:    Vitamin D, Ergocalciferol, (DRISDOL) 1.25 MG (50000 UNIT) CAPS capsule, Take 50,000 Units by mouth once a week., Disp: , Rfl:    nitroGLYCERIN (NITROSTAT) 0.4 MG SL tablet, Place 1 tablet (0.4 mg total) under the tongue every 5 (five) minutes as needed for chest pain. If you require more than two tablets five minutes apart go to the nearest ER via EMS., Disp: 30 tablet, Rfl: 0   pravastatin (PRAVACHOL) 40 MG tablet, TAKE 1 TABLET BY MOUTH DAILY AT 6 PM. (Patient not taking: Reported on 08/03/2023), Disp: 90 tablet, Rfl: 3  Consent:   NA  Disposition:   3 months sooner if needed  His questions and concerns were addressed to his satisfaction. He voices understanding of the recommendations provided during this encounter.  Signed, Tessa Lerner, DO, [facility]  [facility]  [address] [place], [place] [address] 08/03/2023 6:52 PM

## 2023-08-03 NOTE — Patient Instructions (Addendum)
 Medication Instructions:  Your physician has recommended you make the following change in your medication:   DECREASE Ozempic to 1 mg doses  HOLD Losartan if systolic blood pressure (top number) is less than 120.  Starting August 1, STOP Clopidogrel (Plavix) and start Aspirin 81 mg once daily   *If you need a refill on your cardiac medications before your next appointment, please call your pharmacy*  Lab Work: None ordered today. If you have labs (blood work) drawn today and your tests are completely normal, you will receive your results only by: MyChart Message (if you have MyChart) OR A paper copy in the mail If you have any lab test that is abnormal or we need to change your treatment, we will call you to review the results.  Testing/Procedures: None ordered today.  Follow-Up: At [facility], you and your health needs are our priority.  As part of our continuing mission to provide you with exceptional heart care, we have created designated Provider Care Teams.  These Care Teams include your primary Cardiologist (physician) and Advanced Practice Providers (APPs -  Physician Assistants and Nurse Practitioners) who all work together to provide you with the care you need, when you need it.  We recommend signing up for the patient portal called "MyChart".  Sign up information is provided on this After Visit Summary.  MyChart is used to connect with patients for Virtual Visits (Telemedicine).  Patients are able to view lab/test results, encounter notes, upcoming appointments, etc.  Non-urgent messages can be sent to your provider as well.   To learn more about what you can do with MyChart, go to [email or web address].    Your next appointment:   August 2025  The format for your next appointment:   In Person  Provider:   Tessa Lerner, DO {  Other Instructions Follow-up with endocrinology to discuss adrenal insufficiency and possible causes of hypotension.  Follow-up with  pulmonology for shortness of breath despite weight loss.

## 2023-08-07 ENCOUNTER — Telehealth: Payer: Self-pay

## 2023-08-07 NOTE — Telephone Encounter (Signed)
 made request to be tag in [place]

## 2023-08-09 DIAGNOSIS — J449 Chronic obstructive pulmonary disease, unspecified: Secondary | ICD-10-CM | POA: Diagnosis not present

## 2023-08-10 ENCOUNTER — Ambulatory Visit: Payer: PPO | Admitting: Pulmonary Disease

## 2023-08-10 ENCOUNTER — Encounter: Payer: Self-pay | Admitting: Pulmonary Disease

## 2023-08-10 VITALS — BP 122/70 | HR 104 | Ht 70.0 in | Wt 256.0 lb

## 2023-08-10 DIAGNOSIS — R0609 Other forms of dyspnea: Secondary | ICD-10-CM | POA: Diagnosis not present

## 2023-08-10 MED ORDER — TRELEGY ELLIPTA 200-62.5-25 MCG/ACT IN AEPB: INHALATION_SPRAY | RESPIRATORY_TRACT | Status: DC

## 2023-08-10 NOTE — Progress Notes (Signed)
 @Patient  ID: Joshua Lara, male    DOB: 1945-09-17, 78 y.o.   MRN: [id number]  Chief Complaint  Patient presents with   Follow-up    Pt states cardiologist unsure why still SOB     Referring provider: Lonie Peak, PA-C  HPI:   78 y.o. man whom are seen for evaluation of dyspnea on exertion.  Multiple notes in clinic messages from our office, pulmonary office reviewed in the interim.  Most recent cardiology note reviewed.  Overall doing well.  In the past he is advised to begin dyspnea exertion me.  He again denies it today.  Per the cardiology note the cardiologist is worried about dyspnea exertion.  They wanted pulmonary to readdress.  Again he denies significant dyspnea other than his baseline.  This seems unchanged for some years even preceded coronary event 11/2022.  Obvious and significant improvement after PCI to RCA in 11/2022.  He was prescribed Trelegy in the past but did not use because he did not feel short of breath.  We discussed try that again.  PFTs in the past demonstrate restriction versus air trapping, suspect restriction related to habitus.  He said he will try Trelegy at this time.  We have tried inhalers in the past and that did not help.  We can repeat PFTs as well.  We discussed at length the most likely etiology of his shortness of breath is severe grade 3 diastolic dysfunction as demonstrated on post PCI echocardiogram.  Notably no echocardiograms been repeated since then no other investigation for shortness of breath from a cardiac standpoint.  HPI at initial visit: Previously followed by a pulmonary doctor in [place].  Mainly for OSA.  On auto titrating BiPAP.  2 L bled in.  Had dyspnea for some time.  Worse on inclines or stairs.  Uses oxygen with exertion as prescribed in the past.  Prescribed Wixela.  Does not seem to help much.  Use albuterol at night as part of his nighttime routine.  Not sure it helped much during the day.  PFTs in the past have not been  consistent with COPD.  Some concern for asthma in the past.  He had progressive cough throughout the spring.  Worsened a bit through the early summer.  Eventually ended having severe chest pain and shortness of breath went to the ED.  Diagnosed with inferior STEMI status post PCI to the RCA.  Since then cough is essentially gone.  Much better.  Most recent chest x-ray 11/2022 reviewed interpreted as clear lungs with mild hyperinflation.  CT angio chest 12/2015 images reviewed interpret as clear lungs bilaterally.  Former smoker, 60-pack-year history.  Quit in the 90s.  Questionaires / Pulmonary Flowsheets:   ACT:      No data to display          MMRC:     No data to display          Epworth:      No data to display          Tests:   FENO:  No results found for: "NITRICOXIDE"  PFT:    Latest Ref Rng & Units 07/03/2014    9:54 AM  PFT Results  FVC-Pre L 2.86   FVC-Predicted Pre % 92   FVC-Post L 3.03   FVC-Predicted Post % 97   Pre FEV1/FVC % % 79   Post FEV1/FCV % % 77   FEV1-Pre L 2.26   FEV1-Predicted Pre % 99  FEV1-Post L 2.33   DLCO uncorrected ml/min/mmHg 20.93   DLCO UNC% % 103   DLVA Predicted % 115   TLC L 5.01   TLC % Predicted % 96   RV % Predicted % 113   2016 personally viewed and interpreted as normal spirometry, lung volumes within normal limits, DLCO within normal limits Spirometry 12/2022 no fixed obstruction, FVC reduced suggestive of moderate restriction versus air trapping  WALK:      No data to display          Imaging: Personally reviewed and as per EMR discussion this note No results found.   Lab Results: Personally reviewed CBC    Component Value Date/Time   WBC 8.3 12/07/2022 0450   RBC 4.53 12/07/2022 0450   HGB 14.2 12/07/2022 0450   HGB 12.2 (L) 11/04/2016 0334   HCT 41.3 12/07/2022 0450   HCT 37.0 (L) 06/11/2013 1836   PLT 216 12/07/2022 0450   PLT 217 06/11/2013 1836   MCV 91.2 12/07/2022 0450   MCV 92  06/11/2013 1836   MCH 31.3 12/07/2022 0450   MCHC 34.4 12/07/2022 0450   RDW 13.7 12/07/2022 0450   RDW 14.9 (H) 06/11/2013 1836   LYMPHSABS 1.0 12/06/2022 2334   MONOABS 1.1 (H) 12/06/2022 2334   EOSABS 0.5 12/06/2022 2334   BASOSABS 0.1 12/06/2022 2334    BMET    Component Value Date/Time   NA 139 12/12/2022 1358   NA 137 06/11/2013 1836   K 4.1 12/12/2022 1358   K 3.6 06/11/2013 1836   CL 102 12/12/2022 1358   CL 104 06/11/2013 1836   CO2 25 12/12/2022 1358   CO2 26 06/11/2013 1836   GLUCOSE 176 (H) 12/12/2022 1358   GLUCOSE 228 (H) 12/09/2022 0334   GLUCOSE 172 (H) 06/11/2013 1836   BUN 13 12/12/2022 1358   BUN 19 (H) 06/11/2013 1836   CREATININE 1.04 12/12/2022 1358   CREATININE 0.97 06/11/2013 1836   CALCIUM 9.7 12/12/2022 1358   CALCIUM 9.8 06/11/2013 1836   GFRNONAA 53 (L) 12/09/2022 0334   GFRNONAA >60 06/11/2013 1836   GFRAA 99 01/30/2020 1354   GFRAA >60 06/11/2013 1836    BNP    Component Value Date/Time   BNP 24.9 11/30/2022 1800    ProBNP    Component Value Date/Time   PROBNP 461 12/12/2022 1358   PROBNP 8.0 05/29/2014 1017    Specialty Problems       Pulmonary Problems   DOE (dyspnea on exertion)   Chest ct 04/2014:  No obvious PE, bibasilar atx, minimal LN Echo 05/2014:  Normal EF R and LHC 05/2014:  Mild cad, normal right heart pressures.  50+ weight gain in one year PFT's 06/2014: FEV1 2.26 (99%), ratio 79, no restriction, mild airtrapping, DLCO normal        PNA (pneumonia)   Acute respiratory failure (HCC)   Dyspnea   OSA on CPAP    Allergies  Allergen Reactions   Bactrim [Sulfamethoxazole-Trimethoprim] Swelling   Ranitidine Hcl Anaphylaxis and Other (See Comments)    Has anaphylactic shock--intubated and in ICU x 4 days   Adalimumab Other (See Comments)   Etanercept Other (See Comments) and Cough    (ENBREL); Patient thinks it contributed to heart issues   Etanercept     Other reaction(s): Cough (ALLERGY/intolerance),  Other (See Comments) (ENBREL); Patient thinks it contributed to heart issues   Other Other (See Comments)    Tested allergic   Sulfa Antibiotics Other (See  Comments)   Tape Itching    Can only tolerate bandages for less than 24-hr periods    [facility] Other (See Comments)    Tested allergic- Black walnuts    Immunization History  Administered Date(s) Administered   Influenza-Unspecified 03/12/2014   PFIZER(Purple Top)SARS-COV-2 Vaccination 06/25/2019, 07/19/2019   Pneumococcal Conjugate-13 03/12/2013    Past Medical History:  Diagnosis Date   Arthritis    "hands, back, ankles" (05/11/2014)   CHF (congestive heart failure) (HCC)    Chronic lower back pain    Compression fracture of lumbar vertebra (HCC)    Coronary artery disease    Daily headache    "recently" (05/11/2014)   Depression    "I might be slight" (05/11/2014)   GERD (gastroesophageal reflux disease)    High cholesterol    Hypertension    Hypothyroid    OSA treated with BiPAP    Pneumonia    "couple times; last time was 03/2011" (05/11/2014)   Type II diabetes mellitus (HCC)     Tobacco History: Social History   Tobacco Use  Smoking Status Former   Current packs/day: 0.00   Average packs/day: 2.0 packs/day for 30.0 years (60.0 ttl pk-yrs)   Types: Cigarettes   Start date: 05/12/1960   Quit date: 05/12/1990   Years since quitting: 33.2  Smokeless Tobacco Never   Counseling given: Not Answered   Continue to not smoke  Outpatient Encounter Medications as of 08/10/2023  Medication Sig   acetaminophen (TYLENOL) 650 MG CR tablet Take 650 mg by mouth every 8 (eight) hours as needed for pain.   albuterol (VENTOLIN HFA) 108 (90 Base) MCG/ACT inhaler Inhale 1-2 puffs into the lungs every 4 (four) hours as needed for wheezing or shortness of breath.   amoxicillin-clavulanate (AUGMENTIN) 875-125 MG tablet Take 1 tablet by mouth 2 (two) times daily.   apixaban (ELIQUIS) 5 MG TABS tablet Take 1 tablet (5 mg total)  by mouth 2 (two) times daily.   Armodafinil 150 MG tablet Take 150 mg by mouth daily.   BD PEN NEEDLE NANO 2ND GEN 32G X 4 MM MISC    clobetasol cream (TEMOVATE) 0.05 % Apply 1 Application topically as needed (breakouts on left arm).   clopidogrel (PLAVIX) 75 MG tablet TAKE 1 TABLET BY MOUTH EVERY DAY   Continuous Glucose Sensor (FREESTYLE LIBRE 2 SENSOR) MISC Inject 1 Device into the skin every 14 (fourteen) days.   cyanocobalamin (VITAMIN B12) 500 MCG tablet Take 500 mcg by mouth daily.   Evolocumab (REPATHA SURECLICK) 140 MG/ML SOAJ INJECT 140 MG INTO THE SKIN EVERY 14 (FOURTEEN) DAYS.   fenofibrate (TRICOR) 145 MG tablet TAKE 1 TABLET BY MOUTH EVERY DAY   fluticasone (FLONASE) 50 MCG/ACT nasal spray Place 1 spray into both nostrils 2 (two) times daily.   Fluticasone-Umeclidin-Vilant (TRELEGY ELLIPTA) 200-62.5-25 MCG/ACT AEPB 2 samples   folic acid (FOLVITE) 1 MG tablet Take 1 mg by mouth daily.   HUMULIN R U-500 KWIKPEN 500 UNIT/ML kwikpen Inject 125-150 Units into the skin 2 (two) times daily.    latanoprost (XALATAN) 0.005 % ophthalmic solution Place 1 drop into both eyes in the morning and at bedtime.   levothyroxine (SYNTHROID, LEVOTHROID) 100 MCG tablet Take 100 mcg by mouth daily.   losartan (COZAAR) 25 MG tablet TAKE 1 TABLET BY MOUTH EVERY EVENING   methotrexate 2.5 MG tablet Take 2.5 mg by mouth once a week.   Multiple Vitamins-Minerals (CENTRUM SILVER PO) Take 1 tablet by mouth daily.   pantoprazole (  PROTONIX) 40 MG tablet Take 40 mg by mouth daily.   pravastatin (PRAVACHOL) 40 MG tablet TAKE 1 TABLET BY MOUTH DAILY AT 6 PM.   Semaglutide, 1 MG/DOSE, 4 MG/3ML SOPN Inject 1 mg into the skin once a week.   sotalol (BETAPACE) 80 MG tablet Take 0.5 tablets (40 mg total) by mouth 2 (two) times daily.   tamsulosin (FLOMAX) 0.4 MG CAPS capsule Take 0.4 mg by mouth daily after supper.   Vitamin D, Ergocalciferol, (DRISDOL) 1.25 MG (50000 UNIT) CAPS capsule Take 50,000 Units by mouth once  a week.   nitroGLYCERIN (NITROSTAT) 0.4 MG SL tablet Place 1 tablet (0.4 mg total) under the tongue every 5 (five) minutes as needed for chest pain. If you require more than two tablets five minutes apart go to the nearest ER via EMS.   No facility-administered encounter medications on file as of 08/10/2023.     Review of Systems  Review of Systems  N/a Physical Exam  BP 122/70 (BP Location: Right Arm, Patient Position: Sitting, Cuff Size: Normal)   Pulse (!) 104   Ht 5\' 10"  (1.778 m)   Wt 256 lb (116.1 kg)   SpO2 97%   BMI 36.73 kg/m   Wt Readings from Last 5 Encounters:  08/10/23 256 lb (116.1 kg)  08/03/23 253 lb 12.8 oz (115.1 kg)  05/20/23 259 lb 6.4 oz (117.7 kg)  02/12/23 268 lb 12.8 oz (121.9 kg)  01/19/23 272 lb (123.4 kg)    BMI Readings from Last 5 Encounters:  08/10/23 36.73 kg/m  08/03/23 37.48 kg/m  05/20/23 37.76 kg/m  02/12/23 38.57 kg/m  01/19/23 40.17 kg/m     Physical Exam General: Sitting in chair, no acute distress Eyes: EOMI, no icterus Neck: Supple, no JVP Pulmonary: Distant, clear Cardiovascular: Warm, trace edema Abdomen: Nondistended, Sounds present MSK: No synovitis, no joint effusion Neuro: Normal gait, no weakness Psych: Normal mood, full affect   Assessment & Plan:   Dyspnea on exertion: Suspect multifactorial.  Investigated by pulmonary in the past without pulmonary culprit.  Prior spirometry with reduced FVC, suspect element of restriction possibly related to habitus.  Prior PFTs 2016 otherwise normal.  Also with recent STEMI, coronary disease.  Suspect cardiac drivers as well.  No real improvement with Advair.    Asthma felt quite possible, eosinophil mildly elevated in the past as well.   FVC reduction could be related to air trapping otherwise felt more likely related to extraparenchymal restriction, habitus.  Cross-sectional imaging without ILD.  Overall this is improved with time as we get more remote from cardiac event.  No  significant dyspnea now.  Breathing pattern heavy breathing need to rest somewhat for the last 10 to 15 years per family report.  Cardiology is concerned.  If anything feels like related to his severe grade 3 diastolic dysfunction.  Will repeat full PFTs.  Encouraged him to try Trelegy again, he was offered in the past and did not use due to his perceived lack of symptoms.  Has upcoming CT scan 10/2023, ordered by another provider, this can assess for ILD, again none seen in the past.  OSA on autotitrating BIPAP: nocturnal dyspnea improved in terms of frequency and severity after PCI for STEMI.  Adherence report reviewed today with excellent adherence.  AHI 5.2.  He is benefiting clinically from BiPAP, encouraged to continue.   Return in about 3 months (around 11/09/2023) for f/u Dr. Judeth Horn.   Karren Burly, MD 08/10/2023  I spent 41  minutes in the care of the patient today reviewing records, face-to-face visit, coordination of care.

## 2023-08-10 NOTE — Patient Instructions (Signed)
 Try Trelegy 1 puff once a day  See if this helps, I provided enough for 28 days, each inhaler will last 14 days  Rinse your mouth out after every use.  Please get your pulmonary function test next available  Return to clinic in 3 months or sooner as needed with Dr. Judeth Horn

## 2023-08-11 ENCOUNTER — Other Ambulatory Visit: Payer: Self-pay

## 2023-08-11 DIAGNOSIS — I480 Paroxysmal atrial fibrillation: Secondary | ICD-10-CM

## 2023-08-11 MED ORDER — SOTALOL HCL 80 MG PO TABS: 40.0000 mg | ORAL_TABLET | Freq: Two times a day (BID) | ORAL | 3 refills | Status: DC

## 2023-08-13 DIAGNOSIS — Z794 Long term (current) use of insulin: Secondary | ICD-10-CM | POA: Diagnosis not present

## 2023-08-13 DIAGNOSIS — H409 Unspecified glaucoma: Secondary | ICD-10-CM | POA: Diagnosis not present

## 2023-08-13 DIAGNOSIS — I959 Hypotension, unspecified: Secondary | ICD-10-CM | POA: Diagnosis not present

## 2023-08-13 DIAGNOSIS — E1136 Type 2 diabetes mellitus with diabetic cataract: Secondary | ICD-10-CM | POA: Diagnosis not present

## 2023-08-13 DIAGNOSIS — E039 Hypothyroidism, unspecified: Secondary | ICD-10-CM | POA: Diagnosis not present

## 2023-08-13 DIAGNOSIS — E1142 Type 2 diabetes mellitus with diabetic polyneuropathy: Secondary | ICD-10-CM | POA: Diagnosis not present

## 2023-08-18 DIAGNOSIS — I959 Hypotension, unspecified: Secondary | ICD-10-CM | POA: Diagnosis not present

## 2023-08-18 DIAGNOSIS — I10 Essential (primary) hypertension: Secondary | ICD-10-CM | POA: Diagnosis not present

## 2023-08-19 ENCOUNTER — Telehealth: Payer: Self-pay | Admitting: *Deleted

## 2023-08-19 NOTE — Telephone Encounter (Signed)
 Copied from CRM [id number]. Topic: Clinical - Medication Question >> Aug 18, 2023  4:50 PM Amalia Hailey F wrote: Reason for CRM: Patient received samples for Fluticasone-Umeclidin-Vilant (TRELEGY ELLIPTA) 200-62.5-25 MCG/ACT AEPB however there were no instructions on how often to use the sample or how much he is allowed to use of the sample. I attempted to look in the patient's chart, but on the order I didn't see any instructions either. Please clarify and call the patient to relay at [phone].  Instructions are on his AVS from ov 08/10/23:    I called the pt and there was no answer- will forward to [facility] for f/u

## 2023-08-21 ENCOUNTER — Other Ambulatory Visit: Payer: Self-pay

## 2023-08-21 DIAGNOSIS — I5032 Chronic diastolic (congestive) heart failure: Secondary | ICD-10-CM

## 2023-08-21 MED ORDER — TRELEGY ELLIPTA 200-62.5-25 MCG/ACT IN AEPB
1.0000 | INHALATION_SPRAY | Freq: Every day | RESPIRATORY_TRACT | 5 refills | Status: AC
Start: 2023-08-21 — End: ?

## 2023-08-21 MED ORDER — LOSARTAN POTASSIUM 25 MG PO TABS: 25.0000 mg | ORAL_TABLET | Freq: Every evening | ORAL | 3 refills | Status: AC

## 2023-08-21 NOTE — Telephone Encounter (Signed)
 Spoke to patient and advised him of trelegy instructions. He is requesting Rx, as he feels that this medication is effective.  Rx has been sent to preferred pharmacy.  He voiced his understanding.  Nothing further needed.

## 2023-08-26 DIAGNOSIS — F32A Depression, unspecified: Secondary | ICD-10-CM | POA: Diagnosis not present

## 2023-08-26 DIAGNOSIS — J9611 Chronic respiratory failure with hypoxia: Secondary | ICD-10-CM | POA: Diagnosis not present

## 2023-08-26 DIAGNOSIS — I2510 Atherosclerotic heart disease of native coronary artery without angina pectoris: Secondary | ICD-10-CM | POA: Diagnosis not present

## 2023-08-26 DIAGNOSIS — E1169 Type 2 diabetes mellitus with other specified complication: Secondary | ICD-10-CM | POA: Diagnosis not present

## 2023-08-26 DIAGNOSIS — I5032 Chronic diastolic (congestive) heart failure: Secondary | ICD-10-CM | POA: Diagnosis not present

## 2023-08-26 DIAGNOSIS — I10 Essential (primary) hypertension: Secondary | ICD-10-CM | POA: Diagnosis not present

## 2023-08-26 DIAGNOSIS — I480 Paroxysmal atrial fibrillation: Secondary | ICD-10-CM | POA: Diagnosis not present

## 2023-08-26 DIAGNOSIS — M542 Cervicalgia: Secondary | ICD-10-CM | POA: Diagnosis not present

## 2023-08-28 ENCOUNTER — Other Ambulatory Visit: Payer: Self-pay | Admitting: Cardiology

## 2023-08-28 DIAGNOSIS — E119 Type 2 diabetes mellitus without complications: Secondary | ICD-10-CM

## 2023-08-31 ENCOUNTER — Telehealth: Payer: Self-pay | Admitting: Cardiology

## 2023-08-31 DIAGNOSIS — E119 Type 2 diabetes mellitus without complications: Secondary | ICD-10-CM

## 2023-08-31 NOTE — Telephone Encounter (Signed)
*  STAT* If patient is at the pharmacy, call can be transferred to refill team.   1. Which medications need to be refilled? (please list name of each medication and dose if known)   Semaglutide , 1 MG/DOSE, 4 MG/3ML SOPN    2. Which pharmacy/location (including street and city if local pharmacy) is medication to be sent to?  [id number] - [place], [place] - [address] [facility]    3. Do they need a 30 day or 90 day supply? 90

## 2023-09-01 DIAGNOSIS — G4733 Obstructive sleep apnea (adult) (pediatric): Secondary | ICD-10-CM | POA: Diagnosis not present

## 2023-09-01 MED ORDER — SEMAGLUTIDE (1 MG/DOSE) 4 MG/3ML ~~LOC~~ SOPN: 1.0000 mg | PEN_INJECTOR | SUBCUTANEOUS | 11 refills | Status: AC

## 2023-09-04 DIAGNOSIS — G4733 Obstructive sleep apnea (adult) (pediatric): Secondary | ICD-10-CM | POA: Diagnosis not present

## 2023-09-09 DIAGNOSIS — J449 Chronic obstructive pulmonary disease, unspecified: Secondary | ICD-10-CM | POA: Diagnosis not present

## 2023-09-10 ENCOUNTER — Telehealth: Payer: Self-pay

## 2023-09-10 NOTE — Telephone Encounter (Signed)
 Copied from CRM [id number]. Topic: Clinical - Medical Advice >> Sep 08, 2023  4:45 PM Isabell A wrote: Reason for CRM: Patient states he got a phone call in regard to his CPAP supplies, states they told him he will need to speak to his provider about getting a new order stating if he needs it or not.  Spoke with patient regarding prior message. Advised patient He will need a sooner office visit for CPAP compliance . Made patient a f/u on 09/24/23 with Dr.Hunsucker . Patient's voice was understanding.Nothing else further needed.

## 2023-09-10 NOTE — Telephone Encounter (Signed)
 Copied from CRM [id number]. Topic: Clinical - Medical Advice >> Sep 08, 2023  4:45 PM Isabell A wrote: Reason for CRM: Patient states he got a phone call in regard to his CPAP supplies, states they told him he will need to speak to his provider about getting a new order stating if he needs it or not.

## 2023-09-22 DIAGNOSIS — I480 Paroxysmal atrial fibrillation: Secondary | ICD-10-CM | POA: Diagnosis not present

## 2023-09-22 DIAGNOSIS — F32A Depression, unspecified: Secondary | ICD-10-CM | POA: Diagnosis not present

## 2023-09-22 DIAGNOSIS — K115 Sialolithiasis: Secondary | ICD-10-CM | POA: Diagnosis not present

## 2023-09-22 DIAGNOSIS — I959 Hypotension, unspecified: Secondary | ICD-10-CM | POA: Diagnosis not present

## 2023-09-24 ENCOUNTER — Ambulatory Visit: Admitting: Pulmonary Disease

## 2023-09-24 ENCOUNTER — Encounter: Payer: Self-pay | Admitting: Pulmonary Disease

## 2023-09-24 VITALS — BP 98/63 | HR 88 | Ht 69.0 in | Wt 251.4 lb

## 2023-09-24 DIAGNOSIS — G4733 Obstructive sleep apnea (adult) (pediatric): Secondary | ICD-10-CM | POA: Diagnosis not present

## 2023-09-24 NOTE — Progress Notes (Signed)
 @Patient  ID: Joshua Lara, male    DOB: [birth year], 78 y.o.   MRN: [id number]  Chief Complaint  Patient presents with   Follow-up    F/u cpap    Referring provider: Aloha Arnold, PA-C  HPI:   78 y.o. man whom are seen for evaluation of dyspnea on exertion and OSA on BiPAP.   Overall doing well.  Seen 08/10/2023 for BiPAP adherence.  Adherence report and compliance report was reviewed at that time and documented well in my note.  For the reason is most noted and sent to the correct place or was not recognized.  He was rescheduled for BiPAP compliance visit.  He is using Trelegy sparingly.  Helps maybe a little bit.  HPI at initial visit: Previously followed by a pulmonary doctor in [place].  Mainly for OSA.  On auto titrating BiPAP.  2 L bled in.  Had dyspnea for some time.  Worse on inclines or stairs.  Uses oxygen  with exertion as prescribed in the past.  Prescribed Wixela.  Does not seem to help much.  Use albuterol  at night as part of his nighttime routine.  Not sure it helped much during the day.  PFTs in the past have not been consistent with COPD.  Some concern for asthma in the past.  He had progressive cough throughout the spring.  Worsened a bit through the early summer.  Eventually ended having severe chest pain and shortness of breath went to the ED.  Diagnosed with inferior STEMI status post PCI to the RCA.  Since then cough is essentially gone.  Much better.  Most recent chest x-ray 11/2022 reviewed interpreted as clear lungs with mild hyperinflation.  CT angio chest 12/2015 images reviewed interpret as clear lungs bilaterally.  Former smoker, 60-pack-year history.  Quit in the 90s.  Questionaires / Pulmonary Flowsheets:   ACT:      No data to display          MMRC:     No data to display          Epworth:      No data to display          Tests:   FENO:  No results found for: "NITRICOXIDE"  PFT:    Latest Ref Rng & Units 07/03/2014    9:54 AM   PFT Results  FVC-Pre L 2.86   FVC-Predicted Pre % 92   FVC-Post L 3.03   FVC-Predicted Post % 97   Pre FEV1/FVC % % 79   Post FEV1/FCV % % 77   FEV1-Pre L 2.26   FEV1-Predicted Pre % 99   FEV1-Post L 2.33   DLCO uncorrected ml/min/mmHg 20.93   DLCO UNC% % 103   DLVA Predicted % 115   TLC L 5.01   TLC % Predicted % 96   RV % Predicted % 113   2016 personally viewed and interpreted as normal spirometry, lung volumes within normal limits, DLCO within normal limits Spirometry 12/2022 no fixed obstruction, FVC reduced suggestive of moderate restriction versus air trapping  WALK:      No data to display          Imaging: Personally reviewed and as per EMR discussion this note No results found.   Lab Results: Personally reviewed CBC    Component Value Date/Time   WBC 8.3 12/07/2022 0450   RBC 4.53 12/07/2022 0450   HGB 14.2 12/07/2022 0450   HGB 12.2 (L) 11/04/2016 0334   HCT  41.3 12/07/2022 0450   HCT 37.0 (L) 06/11/2013 1836   PLT 216 12/07/2022 0450   PLT 217 06/11/2013 1836   MCV 91.2 12/07/2022 0450   MCV 92 06/11/2013 1836   MCH 31.3 12/07/2022 0450   MCHC 34.4 12/07/2022 0450   RDW 13.7 12/07/2022 0450   RDW 14.9 (H) 06/11/2013 1836   LYMPHSABS 1.0 12/06/2022 2334   MONOABS 1.1 (H) 12/06/2022 2334   EOSABS 0.5 12/06/2022 2334   BASOSABS 0.1 12/06/2022 2334    BMET    Component Value Date/Time   NA 139 12/12/2022 1358   NA 137 06/11/2013 1836   K 4.1 12/12/2022 1358   K 3.6 06/11/2013 1836   CL 102 12/12/2022 1358   CL 104 06/11/2013 1836   CO2 25 12/12/2022 1358   CO2 26 06/11/2013 1836   GLUCOSE 176 (H) 12/12/2022 1358   GLUCOSE 228 (H) 12/09/2022 0334   GLUCOSE 172 (H) 06/11/2013 1836   BUN 13 12/12/2022 1358   BUN 19 (H) 06/11/2013 1836   CREATININE 1.04 12/12/2022 1358   CREATININE 0.97 06/11/2013 1836   CALCIUM  9.7 12/12/2022 1358   CALCIUM  9.8 06/11/2013 1836   GFRNONAA 53 (L) 12/09/2022 0334   GFRNONAA >60 06/11/2013 1836   GFRAA  99 01/30/2020 1354   GFRAA >60 06/11/2013 1836    BNP    Component Value Date/Time   BNP 24.9 11/30/2022 1800    ProBNP    Component Value Date/Time   PROBNP 461 12/12/2022 1358   PROBNP 8.0 05/29/2014 1017    Specialty Problems       Pulmonary Problems   DOE (dyspnea on exertion)   Chest ct 04/2014:  No obvious PE, bibasilar atx, minimal LN Echo 05/2014:  Normal EF R and LHC 05/2014:  Mild cad, normal right heart pressures.  50+ weight gain in one year PFT's 06/2014: FEV1 2.26 (99%), ratio 79, no restriction, mild airtrapping, DLCO normal        PNA (pneumonia)   Acute respiratory failure (HCC)   Dyspnea   OSA on CPAP    Allergies  Allergen Reactions   Bactrim [Sulfamethoxazole-Trimethoprim] Swelling   Ranitidine Hcl Anaphylaxis and Other (See Comments)    Has anaphylactic shock--intubated and in ICU x 4 days   Adalimumab Other (See Comments)   Etanercept Other (See Comments) and Cough    (ENBREL); Patient thinks it contributed to heart issues   Etanercept     Other reaction(s): Cough (ALLERGY/intolerance), Other (See Comments) (ENBREL); Patient thinks it contributed to heart issues   Other Other (See Comments)    Tested allergic   Sulfa Antibiotics Other (See Comments)   Tape Itching    Can only tolerate bandages for less than 24-hr periods    [facility] Other (See Comments)    Tested allergic- Black walnuts    Immunization History  Administered Date(s) Administered   Influenza-Unspecified 03/12/2014   PFIZER(Purple Top)SARS-COV-2 Vaccination 06/25/2019, 07/19/2019   Pneumococcal Conjugate-13 03/12/2013    Past Medical History:  Diagnosis Date   Arthritis    "hands, back, ankles" (05/11/2014)   CHF (congestive heart failure) (HCC)    Chronic lower back pain    Compression fracture of lumbar vertebra (HCC)    Coronary artery disease    Daily headache    "recently" (05/11/2014)   Depression    "I might be slight" (05/11/2014)   GERD  (gastroesophageal reflux disease)    High cholesterol    Hypertension    Hypothyroid  OSA treated with BiPAP    Pneumonia    "couple times; last time was 03/2011" (05/11/2014)   Type II diabetes mellitus (HCC)     Tobacco History: Social History   Tobacco Use  Smoking Status Former   Current packs/day: 0.00   Average packs/day: 2.0 packs/day for 30.0 years (60.0 ttl pk-yrs)   Types: Cigarettes   Start date: 05/12/1960   Quit date: 05/12/1990   Years since quitting: 33.3  Smokeless Tobacco Never   Counseling given: Not Answered   Continue to not smoke  Outpatient Encounter Medications as of 09/24/2023  Medication Sig   acetaminophen  (TYLENOL ) 650 MG CR tablet Take 650 mg by mouth every 8 (eight) hours as needed for pain.   albuterol  (VENTOLIN  HFA) 108 (90 Base) MCG/ACT inhaler Inhale 1-2 puffs into the lungs every 4 (four) hours as needed for wheezing or shortness of breath.   amoxicillin -clavulanate (AUGMENTIN ) 875-125 MG tablet Take 1 tablet by mouth 2 (two) times daily.   apixaban  (ELIQUIS ) 5 MG TABS tablet Take 1 tablet (5 mg total) by mouth 2 (two) times daily.   Armodafinil 150 MG tablet Take 150 mg by mouth daily.   BD PEN NEEDLE NANO 2ND GEN 32G X 4 MM MISC    clobetasol cream (TEMOVATE) 0.05 % Apply 1 Application topically as needed (breakouts on left arm).   clopidogrel  (PLAVIX ) 75 MG tablet TAKE 1 TABLET BY MOUTH EVERY DAY   Continuous Glucose Sensor (FREESTYLE LIBRE 2 SENSOR) MISC Inject 1 Device into the skin every 14 (fourteen) days.   cyanocobalamin (VITAMIN B12) 500 MCG tablet Take 500 mcg by mouth daily.   Evolocumab  (REPATHA  SURECLICK) 140 MG/ML SOAJ INJECT 140 MG INTO THE SKIN EVERY 14 (FOURTEEN) DAYS.   fenofibrate  (TRICOR ) 145 MG tablet TAKE 1 TABLET BY MOUTH EVERY DAY   fluticasone  (FLONASE ) 50 MCG/ACT nasal spray Place 1 spray into both nostrils 2 (two) times daily.   Fluticasone -Umeclidin-Vilant (TRELEGY ELLIPTA ) 200-62.5-25 MCG/ACT AEPB Inhale 1 puff  into the lungs daily.   folic acid  (FOLVITE ) 1 MG tablet Take 1 mg by mouth daily.   HUMULIN R  U-500 KWIKPEN 500 UNIT/ML kwikpen Inject 125-150 Units into the skin 2 (two) times daily.    latanoprost  (XALATAN ) 0.005 % ophthalmic solution Place 1 drop into both eyes in the morning and at bedtime.   levothyroxine  (SYNTHROID , LEVOTHROID) 100 MCG tablet Take 100 mcg by mouth daily.   losartan  (COZAAR ) 25 MG tablet Take 1 tablet (25 mg total) by mouth every evening.   methotrexate 2.5 MG tablet Take 2.5 mg by mouth once a week.   Multiple Vitamins-Minerals (CENTRUM SILVER PO) Take 1 tablet by mouth daily.   pantoprazole  (PROTONIX ) 40 MG tablet Take 40 mg by mouth daily.   pravastatin  (PRAVACHOL ) 40 MG tablet TAKE 1 TABLET BY MOUTH DAILY AT 6 PM.   Semaglutide , 1 MG/DOSE, 4 MG/3ML SOPN Inject 1 mg into the skin once a week.   sotalol  (BETAPACE ) 80 MG tablet Take 0.5 tablets (40 mg total) by mouth 2 (two) times daily.   Vitamin D, Ergocalciferol, (DRISDOL) 1.25 MG (50000 UNIT) CAPS capsule Take 50,000 Units by mouth once a week.   nitroGLYCERIN  (NITROSTAT ) 0.4 MG SL tablet Place 1 tablet (0.4 mg total) under the tongue every 5 (five) minutes as needed for chest pain. If you require more than two tablets five minutes apart go to the nearest ER via EMS.   spironolactone  (ALDACTONE ) 25 MG tablet Take 25 mg by mouth every morning. (  Patient not taking: Reported on 09/24/2023)   tamsulosin  (FLOMAX ) 0.4 MG CAPS capsule Take 0.4 mg by mouth daily after supper. (Patient not taking: Reported on 09/24/2023)   No facility-administered encounter medications on file as of 09/24/2023.     Review of Systems  Review of Systems  N/a Physical Exam  BP 98/63 (BP Location: Left Arm, Patient Position: Sitting, Cuff Size: Normal)   Pulse 88   Ht 5\' 9"  (1.753 m)   Wt 251 lb 6.4 oz (114 kg)   SpO2 95%   BMI 37.13 kg/m   Wt Readings from Last 5 Encounters:  09/24/23 251 lb 6.4 oz (114 kg)  08/10/23 256 lb (116.1  kg)  08/03/23 253 lb 12.8 oz (115.1 kg)  05/20/23 259 lb 6.4 oz (117.7 kg)  02/12/23 268 lb 12.8 oz (121.9 kg)    BMI Readings from Last 5 Encounters:  09/24/23 37.13 kg/m  08/10/23 36.73 kg/m  08/03/23 37.48 kg/m  05/20/23 37.76 kg/m  02/12/23 38.57 kg/m     Physical Exam General: Sitting in chair, no acute distress Eyes: EOMI, no icterus Neck: Supple, no JVP Pulmonary: Distant, clear Cardiovascular: Warm, trace edema Abdomen: Nondistended, Sounds present MSK: No synovitis, no joint effusion Neuro: Normal gait, no weakness Psych: Normal mood, full affect   Assessment & Plan:   Dyspnea on exertion: Suspect multifactorial.  Investigated by pulmonary in the past without pulmonary culprit.  Prior spirometry with reduced FVC, suspect element of restriction possibly related to habitus.  Prior PFTs 2016 otherwise normal.  Also with recent STEMI, coronary disease.  Suspect cardiac drivers as well.  No real improvement with Advair.    Asthma felt quite possible, eosinophil mildly elevated in the past as well.   FVC reduction could be related to air trapping otherwise felt more likely related to extraparenchymal restriction, habitus.  Cross-sectional imaging without ILD.  Overall this is improved with time as we get more remote from cardiac event.  No significant dyspnea now.  Breathing pattern heavy breathing need to rest somewhat for the last 10 to 15 years per family report.  Cardiology is concerned.  If anything feels like related to his severe grade 3 diastolic dysfunction.  Will repeat full PFTs not yet obtained.  Using Trelegy essentially as needed, helps maybe a little bit..  Has upcoming CT scan 10/2023, ordered by another provider, this can assess for ILD, again none seen in the past.  OSA on autotitrating BIPAP: nocturnal dyspnea improved in terms of frequency and severity after PCI for STEMI.  Adherence report reviewed today with excellent adherence.  Using nightly, average use  over 9 hours a day.  AHI 3.0.  He is benefiting clinically from BiPAP, encouraged to continue.   Return in about 6 months (around 03/26/2024) for f/u Dr. Marygrace Snellen.   Guerry Leek, MD 09/24/2023

## 2023-09-30 DIAGNOSIS — F32A Depression, unspecified: Secondary | ICD-10-CM | POA: Diagnosis not present

## 2023-09-30 DIAGNOSIS — I5032 Chronic diastolic (congestive) heart failure: Secondary | ICD-10-CM | POA: Diagnosis not present

## 2023-09-30 DIAGNOSIS — I10 Essential (primary) hypertension: Secondary | ICD-10-CM | POA: Diagnosis not present

## 2023-10-01 DIAGNOSIS — H401112 Primary open-angle glaucoma, right eye, moderate stage: Secondary | ICD-10-CM | POA: Diagnosis not present

## 2023-10-01 DIAGNOSIS — H401123 Primary open-angle glaucoma, left eye, severe stage: Secondary | ICD-10-CM | POA: Diagnosis not present

## 2023-10-04 DIAGNOSIS — G4733 Obstructive sleep apnea (adult) (pediatric): Secondary | ICD-10-CM | POA: Diagnosis not present

## 2023-10-06 DIAGNOSIS — G4733 Obstructive sleep apnea (adult) (pediatric): Secondary | ICD-10-CM | POA: Diagnosis not present

## 2023-10-20 ENCOUNTER — Telehealth: Payer: Self-pay | Admitting: Pulmonary Disease

## 2023-10-20 NOTE — Telephone Encounter (Signed)
 CMN received for oxygen  therapy [facility]

## 2023-10-28 ENCOUNTER — Ambulatory Visit
Admission: RE | Admit: 2023-10-28 | Discharge: 2023-10-28 | Disposition: A | Discharge status: Home / Self Care | Source: Ambulatory Visit | Attending: Cardiology | Admitting: Cardiology

## 2023-10-28 DIAGNOSIS — R079 Chest pain, unspecified: Secondary | ICD-10-CM | POA: Diagnosis not present

## 2023-10-28 DIAGNOSIS — I700 Atherosclerosis of aorta: Secondary | ICD-10-CM | POA: Diagnosis not present

## 2023-10-28 DIAGNOSIS — J439 Emphysema, unspecified: Secondary | ICD-10-CM | POA: Diagnosis not present

## 2023-10-28 DIAGNOSIS — I7121 Aneurysm of the ascending aorta, without rupture: Secondary | ICD-10-CM | POA: Diagnosis not present

## 2023-10-29 ENCOUNTER — Ambulatory Visit

## 2023-10-29 ENCOUNTER — Telehealth: Payer: Self-pay | Admitting: Cardiology

## 2023-10-29 NOTE — Telephone Encounter (Signed)
 Pt c/o medication issue:  1. Name of Medication:  apixaban  (ELIQUIS ) 5 MG TABS tablet   clopidogrel  (PLAVIX ) 75 MG tablet   2. How are you currently taking this medication (dosage and times per day)?    3. Are you having a reaction (difficulty breathing--STAT)? no  4. What is your medication issue? His insurance pharmacist thinks that him taking both medication is dangerous for him  to do. Please advise

## 2023-10-30 NOTE — Telephone Encounter (Signed)
 Eliquis  due to hx of Afib and elevated CHA2DS2-VASc SCORE. On Plavix  given his recent STEMI (less than 1 year ago) He did not believe this to his Engineer, agricultural for clarification.  If still questions arise they can call us .   Joshua Melfi Osage, DO, [facility]

## 2023-11-02 NOTE — Telephone Encounter (Signed)
 Spoke with Joshua Lara and explained Dr. Tolia's response for being on both Eliquis  and Plavix  at the same time. Joshua Lara verbalized understanding and had no further questions at this time. Joshua Lara asked about scheduling his August appt. Appt was made while on the phone with Joshua Lara.

## 2023-11-04 DIAGNOSIS — G4733 Obstructive sleep apnea (adult) (pediatric): Secondary | ICD-10-CM | POA: Diagnosis not present

## 2023-11-11 ENCOUNTER — Telehealth: Payer: Self-pay

## 2023-11-11 NOTE — Telephone Encounter (Signed)
 CRM - received / signed / faxed

## 2023-11-12 ENCOUNTER — Ambulatory Visit: Admitting: Pulmonary Disease

## 2023-11-12 ENCOUNTER — Ambulatory Visit (HOSPITAL_BASED_OUTPATIENT_CLINIC_OR_DEPARTMENT_OTHER): Admitting: Pulmonary Disease

## 2023-11-12 DIAGNOSIS — R0609 Other forms of dyspnea: Secondary | ICD-10-CM | POA: Diagnosis not present

## 2023-11-12 LAB — PULMONARY FUNCTION TEST
DL/VA % pred: 108 %
DL/VA: 4.28 ml/min/mmHg/L
DLCO cor % pred: 80 %
DLCO cor: 19.31 ml/min/mmHg
DLCO unc % pred: 78 %
DLCO unc: 18.97 ml/min/mmHg
FEF 25-75 Post: 2.03 L/s
FEF 25-75 Pre: 1.3 L/s
FEF2575-%Change-Post: 55 %
FEF2575-%Pred-Post: 99 %
FEF2575-%Pred-Pre: 63 %
FEV1-%Change-Post: 9 %
FEV1-%Pred-Post: 75 %
FEV1-%Pred-Pre: 69 %
FEV1-Post: 2.16 L
FEV1-Pre: 1.98 L
FEV1FVC-%Change-Post: 5 %
FEV1FVC-%Pred-Pre: 100 %
FEV6-%Change-Post: 3 %
FEV6-%Pred-Post: 75 %
FEV6-%Pred-Pre: 73 %
FEV6-Post: 2.82 L
FEV6-Pre: 2.72 L
FEV6FVC-%Pred-Post: 106 %
FEV6FVC-%Pred-Pre: 106 %
FVC-%Change-Post: 3 %
FVC-%Pred-Post: 70 %
FVC-%Pred-Pre: 68 %
FVC-Post: 2.82 L
FVC-Pre: 2.72 L
Post FEV1/FVC ratio: 76 %
Post FEV6/FVC ratio: 100 %
Pre FEV1/FVC ratio: 73 %
Pre FEV6/FVC Ratio: 100 %
RV % pred: 94 %
RV: 2.39 L
TLC % pred: 76 %
TLC: 5.21 L

## 2023-11-12 NOTE — Patient Instructions (Signed)
Full PFT performed today, 

## 2023-11-12 NOTE — Progress Notes (Signed)
 Full PFT performed today.

## 2023-11-15 ENCOUNTER — Ambulatory Visit: Payer: Self-pay | Admitting: Cardiology

## 2023-11-15 DIAGNOSIS — I7121 Aneurysm of the ascending aorta, without rupture: Secondary | ICD-10-CM

## 2023-11-15 DIAGNOSIS — I77810 Thoracic aortic ectasia: Secondary | ICD-10-CM

## 2023-11-19 DIAGNOSIS — Z6836 Body mass index (BMI) 36.0-36.9, adult: Secondary | ICD-10-CM | POA: Diagnosis not present

## 2023-11-19 DIAGNOSIS — Z79899 Other long term (current) drug therapy: Secondary | ICD-10-CM | POA: Diagnosis not present

## 2023-11-19 DIAGNOSIS — R053 Chronic cough: Secondary | ICD-10-CM | POA: Diagnosis not present

## 2023-11-19 DIAGNOSIS — M1A09X0 Idiopathic chronic gout, multiple sites, without tophus (tophi): Secondary | ICD-10-CM | POA: Diagnosis not present

## 2023-11-19 DIAGNOSIS — E669 Obesity, unspecified: Secondary | ICD-10-CM | POA: Diagnosis not present

## 2023-11-19 DIAGNOSIS — M0609 Rheumatoid arthritis without rheumatoid factor, multiple sites: Secondary | ICD-10-CM | POA: Diagnosis not present

## 2023-11-22 ENCOUNTER — Other Ambulatory Visit: Payer: Self-pay | Admitting: Cardiology

## 2023-11-22 DIAGNOSIS — I5032 Chronic diastolic (congestive) heart failure: Secondary | ICD-10-CM

## 2023-11-25 ENCOUNTER — Other Ambulatory Visit: Payer: Self-pay | Admitting: Cardiology

## 2023-11-25 DIAGNOSIS — I5033 Acute on chronic diastolic (congestive) heart failure: Secondary | ICD-10-CM

## 2023-11-25 NOTE — Telephone Encounter (Signed)
 Prescription refill request for Eliquis  received. Indication: PAF Last office visit: 08/03/23  S Tolia DO Scr: 1.04 on 12/12/22  Epic Age: 78 Weight: 115.1kg  Based on above findings Eliquis  5mg  twice daily is the appropriate dose.  Refill approved.

## 2023-12-04 DIAGNOSIS — G4733 Obstructive sleep apnea (adult) (pediatric): Secondary | ICD-10-CM | POA: Diagnosis not present

## 2023-12-08 DIAGNOSIS — G4733 Obstructive sleep apnea (adult) (pediatric): Secondary | ICD-10-CM | POA: Diagnosis not present

## 2023-12-09 DIAGNOSIS — J449 Chronic obstructive pulmonary disease, unspecified: Secondary | ICD-10-CM | POA: Diagnosis not present

## 2023-12-24 ENCOUNTER — Ambulatory Visit: Admitting: Cardiology

## 2023-12-25 ENCOUNTER — Encounter: Payer: Self-pay | Admitting: Cardiology

## 2023-12-25 ENCOUNTER — Ambulatory Visit: Attending: Cardiology | Admitting: Cardiology

## 2023-12-25 VITALS — BP 108/58 | HR 117 | Resp 16 | Ht 69.0 in | Wt 246.0 lb

## 2023-12-25 DIAGNOSIS — Z79899 Other long term (current) drug therapy: Secondary | ICD-10-CM | POA: Diagnosis not present

## 2023-12-25 DIAGNOSIS — E781 Pure hyperglyceridemia: Secondary | ICD-10-CM

## 2023-12-25 DIAGNOSIS — I252 Old myocardial infarction: Secondary | ICD-10-CM

## 2023-12-25 DIAGNOSIS — Z7901 Long term (current) use of anticoagulants: Secondary | ICD-10-CM

## 2023-12-25 DIAGNOSIS — R9431 Abnormal electrocardiogram [ECG] [EKG]: Secondary | ICD-10-CM

## 2023-12-25 DIAGNOSIS — R0609 Other forms of dyspnea: Secondary | ICD-10-CM | POA: Diagnosis not present

## 2023-12-25 DIAGNOSIS — I4819 Other persistent atrial fibrillation: Secondary | ICD-10-CM | POA: Diagnosis not present

## 2023-12-25 DIAGNOSIS — I5032 Chronic diastolic (congestive) heart failure: Secondary | ICD-10-CM | POA: Diagnosis not present

## 2023-12-25 DIAGNOSIS — I7121 Aneurysm of the ascending aorta, without rupture: Secondary | ICD-10-CM

## 2023-12-25 DIAGNOSIS — E7800 Pure hypercholesterolemia, unspecified: Secondary | ICD-10-CM | POA: Diagnosis not present

## 2023-12-25 DIAGNOSIS — I2510 Atherosclerotic heart disease of native coronary artery without angina pectoris: Secondary | ICD-10-CM

## 2023-12-25 DIAGNOSIS — Z789 Other specified health status: Secondary | ICD-10-CM

## 2023-12-25 DIAGNOSIS — Z87891 Personal history of nicotine dependence: Secondary | ICD-10-CM

## 2023-12-25 DIAGNOSIS — Z955 Presence of coronary angioplasty implant and graft: Secondary | ICD-10-CM | POA: Diagnosis not present

## 2023-12-25 DIAGNOSIS — G4733 Obstructive sleep apnea (adult) (pediatric): Secondary | ICD-10-CM

## 2023-12-25 DIAGNOSIS — I480 Paroxysmal atrial fibrillation: Secondary | ICD-10-CM | POA: Diagnosis not present

## 2023-12-25 MED ORDER — ASPIRIN 81 MG PO TBEC: 81.0000 mg | DELAYED_RELEASE_TABLET | Freq: Every day | ORAL | Status: AC

## 2023-12-25 MED ORDER — METOPROLOL SUCCINATE ER 25 MG PO TB24: 25.0000 mg | ORAL_TABLET | Freq: Every day | ORAL | 3 refills | Status: AC

## 2023-12-25 NOTE — Patient Instructions (Signed)
 Medication Instructions:  STOP Sotalol   STOP Plavix   START Aspirin  81 mg once daily in the morning  START Metoprolol  Succinate (Toprol -XL) 25 mg once daily in the morning HOLD if systolic blood pressure (top number) is less than 100 and/ or heart rate is less than 55.  *If you need a refill on your cardiac medications before your next appointment, please call your pharmacy*  Lab Work: To be completed today: BMP and Magnesium   If you have labs (blood work) drawn today and your tests are completely normal, you will receive your results only by: MyChart Message (if you have MyChart) OR A paper copy in the mail If you have any lab test that is abnormal or we need to change your treatment, we will call you to review the results.  Testing/Procedures: Your provider has requested that you have an EKG next week.  Follow-Up: At [facility], you and your health needs are our priority.  As part of our continuing mission to provide you with exceptional heart care, our providers are all part of one team.  This team includes your primary Cardiologist (physician) and Advanced Practice Providers or APPs (Physician Assistants and Nurse Practitioners) who all work together to provide you with the care you need, when you need it.  Your next appointment:   3 month(s)  Provider:   Madonna Large, DO    We recommend signing up for the patient portal called MyChart.  Sign up information is provided on this After Visit Summary.  MyChart is used to connect with patients for Virtual Visits (Telemedicine).  Patients are able to view lab/test results, encounter notes, upcoming appointments, etc.  Non-urgent messages can be sent to your provider as well.   To learn more about what you can do with MyChart, go to [email or web address].   Other Instructions You have been referred to Electrophysiology with Dr. Nancey for afib management. Someone from our office will reach out to you in the near future  to schedule this appointment.

## 2023-12-25 NOTE — Progress Notes (Signed)
 Cardiology Office Note:  .   Date:  12/25/2023  ID:  Joshua Lara, DOB 02-16-1946, MRN [id number] PCP:  Montey Lot, PA-C  Former Cardiology Providers: Emmalene Lawrence, APRN, FNP-C  [facility] Providers Cardiologist:  Madonna Large, DO , [facility] (established care 10/28/2019) Electrophysiologist:  None  Click to update primary MD,subspecialty MD or APP then REFRESH:1}    Chief Complaint  Patient presents with   Atherosclerosis of native coronary artery of native heart w   Follow-up    History of Present Illness: .   Joshua Lara is a 78 y.o. Caucasian male whose past medical history and cardiovascular risk factors includes: Acute inferolateral STEMI status post thrombectomy and stenting (December 07, 2022), persistent atrial fibrillation (status post cardioversion July 2024), heart failure with improved EF, chronic HFpEF, ascending aortic aneurysm 42 mm (CT Chest 07/04/2022), hypertension, hyperlipidemia, OSA on BiPAP, diabetes mellitus, glaucoma, obesity due to excess calories, advanced age.   Patient is being followed by the practice for HFpEF, A-fib, CAD status post STEMI July 2024, hyperlipidemia with statin intolerance.  In July 2024 he presented to [facility] and noted to have inferolateral STEMI on presentation.  Taken to the Cath Lab emergently underwent aspiration thrombectomy followed by direct stenting.  Since then  we have been focusing on improving his modifiable cardiovascular risk factors.  Given his persistent atrial fibrillation he is on sotalol  for rate and rhythm control strategy as well as anticoagulation for thromboembolic prophylaxis  Patient is accompanied by his daughter at today's office visit who also provides collateral history.  Since last office visit he denies chest pain. Shortness of breath remains chronic and stable. Primary care team has weaned off multiple GDMT medications due to hypotension. Patient does not endorse evidence of  bleeding. No hospitalization for congestive heart failure since last office visit. With lifestyle changes and pharmacological therapy he has lost an additional 9 pound since last office visit.  Review of Systems: .   Review of Systems  Constitutional: Positive for malaise/fatigue and weight loss.  Cardiovascular:  Positive for dyspnea on exertion and palpitations. Negative for chest pain, claudication, irregular heartbeat, leg swelling, near-syncope, orthopnea, paroxysmal nocturnal dyspnea and syncope.  Respiratory:  Negative for shortness of breath.   Hematologic/Lymphatic: Negative for bleeding problem.    Studies Reviewed:   EKG: EKG Interpretation Date/Time:  Friday December 25 2023 15:08:17 EDT Ventricular Rate:  121 PR Interval:  240 QRS Duration:  92 QT Interval:  416 QTC Calculation: 590 R Axis:   120  Text Interpretation: Sinus tachycardia with 1st degree A-V block Left posterior fascicular block Long QT Cannot rule out Anterior infarct (cited on or before 07-Dec-2022) When compared with ECG of 03-Aug-2023 13:13, Since last tracing rate faster QT has lengthened Confirmed by Large Madonna [phone]) on 12/25/2023 3:30:15 PM --reached out to EP as part of today's office visit who reviewed the EKG independently and suspects he either has sinus rhythm with arrhythmia versus atrial fibrillation.  But agrees the QT is quite prolonged.  Echocardiogram: 01/14/2019: LVEF 45-50%, calculated LVEF 40%, grade 1 diastolic impairment, mild LAE, see report for details.   11/2022 1. Left ventricular ejection fraction, by estimation, is 55 to 60%. The left ventricle has normal function. The left ventricle has no regional wall motion abnormalities. There is mild left ventricular hypertrophy. Left ventricular diastolic parameters  are consistent with Grade III diastolic dysfunction (restrictive). Elevated left ventricular end-diastolic pressure.  2. Right ventricular systolic function is normal. The  right ventricular size is normal.  3. The mitral valve is normal in structure. No evidence of mitral valve regurgitation. No evidence of mitral stenosis.  4. The aortic valve is grossly normal. Aortic valve regurgitation is not visualized. Aortic valve sclerosis is present, with no evidence of aortic valve stenosis.  5. Aortic dilatation noted. There is dilatation of the ascending aorta, measuring 39 mm. There is dilatation of the aortic root, measuring 43 mm.  6. The inferior vena cava is normal in size with greater than 50% respiratory variability, suggesting right atrial pressure of 3 mmHg.  Left Heart Catheterization 12/07/22:  LV: 109/19, EDP 27 mmHg.  Ao: 1 6/61, mean 76 mmHg.  No pressure gradient across the aortic valve on 10 mcg/kg/min of dopamine . LM: Large-caliber vessel.  Smooth and normal. LAD: Large-caliber vessel, gives origin to large D1.  Smooth and normal. Ramus intermediate: Moderate caliber vessel.  Smooth and normal. CX: Moderate caliber vessel.  Smooth and normal. RCA: Large-caliber vessel, gives origin to a large PDA and 2 small PL branches originating close by to each other, there is an ulcerated 100% occlusion of the distal right coronary artery.    Intervention data: Successful thrombectomy with Pronto aspiration followed by direct stenting with 3.5 x 24 mm Synergy XD DES at 13 atmospheric pressure, stenosis reduced from 100% to 0% TIMI-0 flow improved to TIMI-3 flow.  RADIOLOGY: CT chest w/o contrast 11/08/2023 1. No CT evidence for acute intrathoracic abnormality. 2. Mild emphysema. 3. Mild aneurysmal dilatation of the ascending aorta measuring up to 4.1 cm, previously 4.2 cm. Recommend annual imaging followup by CTA or MRA. This recommendation follows 2010 ACCF/AHA/AATS/ACR/ASA/SCA/SCAI/SIR/STS/SVM Guidelines for the Diagnosis and Management of Patients with Thoracic Aortic Disease. Circulation. 2010; 121: Z733-z630. Aortic aneurysm NOS (ICD10-I71.9) 4.  Possible cirrhosis.   Aortic Atherosclerosis (ICD10-I70.0) and Emphysema (ICD10-J43.9).  Risk Assessment/Calculations:   Click Here to Calculate/Change CHADS2VASc Score The patient's CHADS2-VASc score is 6, indicating a 9.7% annual risk of stroke.   CHF History: Yes HTN History: Yes Diabetes History: Yes Stroke History: No Vascular Disease History: Yes  Labs:       Latest Ref Rng & Units 12/07/2022    4:50 AM 12/07/2022   12:24 AM 12/06/2022   11:34 PM  CBC  WBC 4.0 - 10.5 K/uL 8.3   8.5   Hemoglobin 13.0 - 17.0 g/dL 85.7  85.6  85.4   Hematocrit 39.0 - 52.0 % 41.3  42.0  42.2   Platelets 150 - 400 K/uL 216   251        Latest Ref Rng & Units 12/12/2022    1:58 PM 12/09/2022    3:34 AM 12/07/2022    4:50 AM  BMP  Glucose 70 - 99 mg/dL 823  771  766   BUN 8 - 27 mg/dL 13  24  14    Creatinine 0.76 - 1.27 mg/dL 8.95  8.62  8.55   BUN/Creat Ratio 10 - 24 13     Sodium 134 - 144 mmol/L 139  136  133   Potassium 3.5 - 5.2 mmol/L 4.1  4.0  3.9   Chloride 96 - 106 mmol/L 102  101  98   CO2 20 - 29 mmol/L 25  26  25    Calcium  8.6 - 10.2 mg/dL 9.7  9.2  9.1       Latest Ref Rng & Units 12/12/2022    1:58 PM 12/09/2022    3:34 AM 12/07/2022    4:50 AM  CMP  Glucose 70 - 99 mg/dL 823  771  766   BUN 8 - 27 mg/dL 13  24  14    Creatinine 0.76 - 1.27 mg/dL 8.95  8.62  8.55   Sodium 134 - 144 mmol/L 139  136  133   Potassium 3.5 - 5.2 mmol/L 4.1  4.0  3.9   Chloride 96 - 106 mmol/L 102  101  98   CO2 20 - 29 mmol/L 25  26  25    Calcium  8.6 - 10.2 mg/dL 9.7  9.2  9.1     Lab Results  Component Value Date   CHOL 93 12/08/2022   HDL 39 (L) 12/08/2022   LDLCALC 30 12/08/2022   LDLDIRECT 35 12/08/2022   TRIG 118 12/08/2022   CHOLHDL 2.4 12/08/2022   No results for input(s): LIPOA in the last 8760 hours.  No components found for: NTPROBNP No results for input(s): PROBNP in the last 8760 hours.  No results for input(s): TSH in the last 8760 hours.  External  Labs: Collected: 06/07/2021 provided by PCP. BUN 11, creatinine 0.9. eGFR 90. Sodium 141, potassium 4, chloride 103, bicarb 27 AST 23, ALT 27, alkaline phosphatase 113 Hemoglobin 16.9 g/dL, hematocrit 49.6% BNP 8.2 Total cholesterol 173, triglycerides 259, HDL 42, LDL 88   External Labs: Collected: 08/13/2022 provided by PCP BUN 16, creatinine 1.08. BUN to creatinine ratio 15. Sodium 135, potassium 4.4, chloride 99, bicarb 23. AST 26, ALT 24, alkaline phosphatase 64 Hemoglobin 15.1, hematocrit 43.9% BNP 4.4  Physical Exam:    Today's Vitals   12/25/23 1501  BP: (!) 108/58  Pulse: (!) 117  Resp: 16  SpO2: 95%  Weight: 246 lb (111.6 kg)  Height: 5' 9 (1.753 m)   Body mass index is 36.33 kg/m. Wt Readings from Last 3 Encounters:  12/25/23 246 lb (111.6 kg)  09/24/23 251 lb 6.4 oz (114 kg)  08/10/23 256 lb (116.1 kg)    Physical Exam  Constitutional: No distress. He appears chronically ill.  hemodynamically stable  Neck: No JVD present.  Cardiovascular: S1 normal and S2 normal. An irregularly irregular rhythm present. Tachycardia present. Exam reveals no gallop, no S3 and no S4.  No murmur heard. Pulmonary/Chest: Effort normal and breath sounds normal. No stridor. He has no wheezes. He has no rales.  Abdominal: Soft. He exhibits no distension. There is no abdominal tenderness.  Musculoskeletal:        General: No edema.     Cervical back: Neck supple.  Skin: Skin is warm.     Impression & Recommendation(s):  Impression:   ICD-10-CM   1. Atherosclerosis of native coronary artery of native heart without angina pectoris  I25.10 EKG 12-Lead    Magnesium     Basic metabolic panel with GFR    aspirin  EC 81 MG tablet    Basic metabolic panel with GFR    Magnesium     2. History of ST elevation myocardial infarction (STEMI)  I25.2     3. Coronary stent patent  Z95.5 aspirin  EC 81 MG tablet    4. Heart failure with improved ejection fraction (HFimpEF) (HCC)  I50.32      5. Dyspnea on exertion  R06.09     6. Aneurysm of ascending aorta without rupture (HCC)  I71.21     7. Persistent atrial fibrillation (HCC)  I48.19 Magnesium     Basic metabolic panel with GFR    Ambulatory referral to Cardiac Electrophysiology    metoprolol  succinate (TOPROL -XL) 25 MG  24 hr tablet    Basic metabolic panel with GFR    Magnesium     8. Long term (current) use of anticoagulants  Z79.01     9. Prolonged Q-T interval on ECG  R94.31     10. Long term current use of antiarrhythmic drug  Z79.899     11. Pure hypercholesterolemia  E78.00     12. Hypertriglyceridemia  E78.1     13. Statin intolerance  Z78.9     14. OSA treated with BiPAP  G47.33     15. Former smoker  Z87.891        Recommendation(s):  Atherosclerosis of native coronary artery of native heart without angina pectoris History of ST elevation myocardial infarction (STEMI) Coronary stent patent Denies anginal chest pain or heart failure symptoms. EKG is nonischemic. No use of sublingual nitroglycerin  tablets since the last office visit. Patient has completed 1 year of Plavix  75 mg p.o. daily.  To minimize the risk of bleeding we will discontinue Plavix  and start aspirin  81 mg p.o. daily. Continue Repatha , fenofibrate .  Heart failure with improved ejection fraction (HFimpEF) (HCC) Stage C, NYHA class II/III July 2021 LVEF 45-50% see report for additional details. July 2024: LVEF 55 to 60%, grade 3 diastolic dysfunction, see report for details Uptitration of GDMT has been difficult due to soft blood pressures. Of note Jardiance were discontinued in the past secondary to yeast infection. Continue losartan  25 mg p.o. every afternoon. Spironolactone  discontinued by PCP due to soft blood pressures Will discontinue sotalol  due to prolonged QT at today's visit. His dyspnea is chronic and multifactorial. He has lost close to 31 pounds since initiation of Ozempic .  Aneurysm of ascending aorta without  rupture [facility] at [facility] February [facility]24-per report ascending aorta 42 mm. CT chest without contrast 10/2023 -stable ascending aorta dilatation at 41 mm. Blood pressures are well-controlled/soft We will repeat CT chest in 1 year for reevaluation of disease progression Avoid medications such as Ciprofloxacillin, uncontrolled hypertension, avoid heavy lifting that may increase intra-abdominal pressures or require Valsalva maneuver.  Persistent atrial fibrillation (HCC) Long term (current) use of anticoagulants Long term current use of antiarrhythmic drug Prolonged QT History of cardioversion Will discontinue sotalol  today due to prolonged QT Remains on Eliquis  for thromboembolic prophylaxis EKG on initial review by myself felt the underlying rhythm was sinus tachycardia.  However I did review it with Dr. Nancey was in the office today for EP standpoint underlying rhythm could also be atrial fibrillation.  Nonetheless given the prolonged QT shared decision was to discontinue sotalol . Will check a BMP and magnesium  to make sure electrolytes are within normal limits. Will start low-dose Lopressor  25 mg p.o. every morning And I will refer him to EP for atrial fibrillation management-ablation candidacy versus different antiarrhythmic given his comorbidities, on methotrexate.  Patient will come in 1 week later for repeat EKG to check QT interval  Type 2 diabetes mellitus without complication, with long-term current use of insulin  (HCC) With complications of glaucoma, CHF, CAD Currently on Ozempic , Repatha , fenofibrate , and losartan    Pure hypercholesterolemia Hypertriglyceridemia Statin intolerance Currently on Repatha  140 mg subcu injection every 14 days. Continue current medical therapy  OSA treated with BiPAP Reemphasized importance of compliance.  Discussed management of at least 2 chronic comorbid conditions. Due to change in rhythm and prolonged QT reached out to Dr.  Nancey to review the case as well as EKG. Prescription drug management Reviewed the CT chest without contrast on 11/08/2023 Ordered  additional diagnostic testing, labs as noted above Consult to EP for atrial fibrillation management  Orders Placed:  Orders Placed This Encounter  Procedures   Magnesium     Standing Status:   Future    Number of Occurrences:   1    Expected Date:   12/25/2023    Expiration Date:   12/24/2024   Basic metabolic panel with GFR    Standing Status:   Future    Number of Occurrences:   1    Expected Date:   12/25/2023    Expiration Date:   12/24/2024   Ambulatory referral to Cardiac Electrophysiology    Referral Priority:   Routine    Referral Type:   Consultation    Referral Reason:   Specialty Services Required    Referred to Provider:   Mealor, Eulas BRAVO, MD    Requested Specialty:   Cardiology    Number of Visits Requested:   1   EKG 12-Lead     Final Medication List:    Meds ordered this encounter  Medications   metoprolol  succinate (TOPROL -XL) 25 MG 24 hr tablet    Sig: Take 1 tablet (25 mg total) by mouth daily. HOLD if systolic blood pressure (top number) is less than 100 and/ or heart rate is less than 55.    Dispense:  30 tablet    Refill:  3    Switching from Sotalol  to Metoprolol    aspirin  EC 81 MG tablet    Sig: Take 1 tablet (81 mg total) by mouth daily. Swallow whole.    Medications Discontinued During This Encounter  Medication Reason   spironolactone  (ALDACTONE ) 25 MG tablet Patient Preference   tamsulosin  (FLOMAX ) 0.4 MG CAPS capsule Patient Preference   sotalol  (BETAPACE ) 80 MG tablet Change in therapy   clopidogrel  (PLAVIX ) 75 MG tablet Discontinued by provider     Current Outpatient Medications:    acetaminophen  (TYLENOL ) 650 MG CR tablet, Take 650 mg by mouth every 8 (eight) hours as needed for pain., Disp: , Rfl:    albuterol  (VENTOLIN  HFA) 108 (90 Base) MCG/ACT inhaler, Inhale 1-2 puffs into the lungs every 4 (four) hours  as needed for wheezing or shortness of breath., Disp: , Rfl:    amoxicillin -clavulanate (AUGMENTIN ) 875-125 MG tablet, Take 1 tablet by mouth 2 (two) times daily., Disp: 14 tablet, Rfl: 0   Armodafinil 150 MG tablet, Take 150 mg by mouth daily., Disp: , Rfl:    aspirin  EC 81 MG tablet, Take 1 tablet (81 mg total) by mouth daily. Swallow whole., Disp: , Rfl:    BD PEN NEEDLE NANO 2ND GEN 32G X 4 MM MISC, , Disp: , Rfl:    clobetasol cream (TEMOVATE) 0.05 %, Apply 1 Application topically as needed (breakouts on left arm)., Disp: , Rfl:    Continuous Glucose Sensor (FREESTYLE LIBRE 2 SENSOR) MISC, Inject 1 Device into the skin every 14 (fourteen) days., Disp: , Rfl:    cyanocobalamin (VITAMIN B12) 500 MCG tablet, Take 500 mcg by mouth daily., Disp: , Rfl:    ELIQUIS  5 MG TABS tablet, TAKE 1 TABLET BY MOUTH TWICE A DAY, Disp: 60 tablet, Rfl: 5   Evolocumab  (REPATHA  SURECLICK) 140 MG/ML SOAJ, INJECT 140 MG INTO THE SKIN EVERY 14 (FOURTEEN) DAYS., Disp: 6 mL, Rfl: 3   fenofibrate  (TRICOR ) 145 MG tablet, TAKE 1 TABLET BY MOUTH EVERY DAY, Disp: 90 tablet, Rfl: 2   fluticasone  (FLONASE ) 50 MCG/ACT nasal spray, Place 1 spray into both nostrils  2 (two) times daily., Disp: , Rfl:    Fluticasone -Umeclidin-Vilant (TRELEGY ELLIPTA ) 200-62.5-25 MCG/ACT AEPB, Inhale 1 puff into the lungs daily., Disp: 60 each, Rfl: 5   folic acid  (FOLVITE ) 1 MG tablet, Take 1 mg by mouth daily., Disp: , Rfl:    HUMULIN R  U-500 KWIKPEN 500 UNIT/ML kwikpen, Inject 125-150 Units into the skin 2 (two) times daily. , Disp: , Rfl:    latanoprost  (XALATAN ) 0.005 % ophthalmic solution, Place 1 drop into both eyes in the morning and at bedtime., Disp: , Rfl:    levothyroxine  (SYNTHROID , LEVOTHROID) 100 MCG tablet, Take 100 mcg by mouth daily., Disp: , Rfl:    losartan  (COZAAR ) 25 MG tablet, Take 1 tablet (25 mg total) by mouth every evening., Disp: 90 tablet, Rfl: 3   methotrexate 2.5 MG tablet, Take 2.5 mg by mouth once a week., Disp: ,  Rfl:    metoprolol  succinate (TOPROL -XL) 25 MG 24 hr tablet, Take 1 tablet (25 mg total) by mouth daily. HOLD if systolic blood pressure (top number) is less than 100 and/ or heart rate is less than 55., Disp: 30 tablet, Rfl: 3   Multiple Vitamins-Minerals (CENTRUM SILVER PO), Take 1 tablet by mouth daily., Disp: , Rfl:    nitroGLYCERIN  (NITROSTAT ) 0.4 MG SL tablet, Place 1 tablet (0.4 mg total) under the tongue every 5 (five) minutes as needed for chest pain. If you require more than two tablets five minutes apart go to the nearest ER via EMS., Disp: 30 tablet, Rfl: 0   pantoprazole  (PROTONIX ) 40 MG tablet, Take 40 mg by mouth daily., Disp: , Rfl:    pravastatin  (PRAVACHOL ) 40 MG tablet, TAKE 1 TABLET BY MOUTH DAILY AT 6 PM., Disp: 90 tablet, Rfl: 3   Semaglutide , 1 MG/DOSE, 4 MG/3ML SOPN, Inject 1 mg into the skin once a week., Disp: 3 mL, Rfl: 11   Vitamin D, Ergocalciferol, (DRISDOL) 1.25 MG (50000 UNIT) CAPS capsule, Take 50,000 Units by mouth once a week., Disp: , Rfl:   Consent:   NA  Disposition:   3 months sooner if needed  His questions and concerns were addressed to his satisfaction. He voices understanding of the recommendations provided during this encounter.    Signed, Madonna Michele HAS, [facility]  A Division of [facility] [address], [place], [place] [address]  [place], [place] [address]  12/25/2023 7:12 PM

## 2023-12-26 ENCOUNTER — Ambulatory Visit: Payer: Self-pay | Admitting: Cardiology

## 2023-12-26 LAB — BASIC METABOLIC PANEL WITH GFR
BUN/Creatinine Ratio: 14 (ref 10–24)
BUN: 15 mg/dL (ref 8–27)
CO2: 24 mmol/L (ref 20–29)
Calcium: 10.2 mg/dL (ref 8.6–10.2)
Chloride: 102 mmol/L (ref 96–106)
Creatinine, Ser: 1.08 mg/dL (ref 0.76–1.27)
Glucose: 81 mg/dL (ref 70–99)
Potassium: 3.8 mmol/L (ref 3.5–5.2)
Sodium: 142 mmol/L (ref 134–144)
eGFR: 71 mL/min/1.73 (ref >59)

## 2023-12-26 LAB — MAGNESIUM: Magnesium: 1.9 mg/dL (ref 1.6–2.3)

## 2023-12-28 DIAGNOSIS — E039 Hypothyroidism, unspecified: Secondary | ICD-10-CM | POA: Diagnosis not present

## 2023-12-28 DIAGNOSIS — E1142 Type 2 diabetes mellitus with diabetic polyneuropathy: Secondary | ICD-10-CM | POA: Diagnosis not present

## 2023-12-28 DIAGNOSIS — E1136 Type 2 diabetes mellitus with diabetic cataract: Secondary | ICD-10-CM | POA: Diagnosis not present

## 2023-12-28 DIAGNOSIS — Z794 Long term (current) use of insulin: Secondary | ICD-10-CM | POA: Diagnosis not present

## 2023-12-28 DIAGNOSIS — H409 Unspecified glaucoma: Secondary | ICD-10-CM | POA: Diagnosis not present

## 2023-12-30 ENCOUNTER — Ambulatory Visit

## 2024-01-01 ENCOUNTER — Ambulatory Visit: Attending: Internal Medicine

## 2024-01-01 VITALS — BP 108/60 | HR 98 | Ht 70.0 in

## 2024-01-01 DIAGNOSIS — Z79899 Other long term (current) drug therapy: Secondary | ICD-10-CM

## 2024-01-01 DIAGNOSIS — I480 Paroxysmal atrial fibrillation: Secondary | ICD-10-CM

## 2024-01-01 NOTE — Patient Instructions (Signed)
 Medication Instructions:  Your physician recommends that you continue on your current medications as directed. Please refer to the Current Medication list given to you today.  *If you need a refill on your cardiac medications before your next appointment, please call your pharmacy*  Lab Work: N/A If you have labs (blood work) drawn today and your tests are completely normal, you will receive your results only by: MyChart Message (if you have MyChart) OR A paper copy in the mail If you have any lab test that is abnormal or we need to change your treatment, we will call you to review the results.  Testing/Procedures: N/A  Follow-Up: At [facility], you and your health needs are our priority.  As part of our continuing mission to provide you with exceptional heart care, our providers are all part of one team.  This team includes your primary Cardiologist (physician) and Advanced Practice Providers or APPs (Physician Assistants and Nurse Practitioners) who all work together to provide you with the care you need, when you need it.

## 2024-01-04 DIAGNOSIS — G4733 Obstructive sleep apnea (adult) (pediatric): Secondary | ICD-10-CM | POA: Diagnosis not present

## 2024-01-06 ENCOUNTER — Ambulatory Visit (INDEPENDENT_AMBULATORY_CARE_PROVIDER_SITE_OTHER): Payer: PPO | Admitting: Otolaryngology

## 2024-01-06 ENCOUNTER — Encounter (INDEPENDENT_AMBULATORY_CARE_PROVIDER_SITE_OTHER): Payer: Self-pay | Admitting: Otolaryngology

## 2024-01-06 VITALS — BP 136/82 | HR 90

## 2024-01-06 DIAGNOSIS — Z09 Encounter for follow-up examination after completed treatment for conditions other than malignant neoplasm: Secondary | ICD-10-CM | POA: Diagnosis not present

## 2024-01-06 DIAGNOSIS — Z8669 Personal history of other diseases of the nervous system and sense organs: Secondary | ICD-10-CM

## 2024-01-06 DIAGNOSIS — H9072 Mixed conductive and sensorineural hearing loss, unilateral, left ear, with unrestricted hearing on the contralateral side: Secondary | ICD-10-CM | POA: Diagnosis not present

## 2024-01-06 DIAGNOSIS — H9041 Sensorineural hearing loss, unilateral, right ear, with unrestricted hearing on the contralateral side: Secondary | ICD-10-CM | POA: Diagnosis not present

## 2024-01-06 DIAGNOSIS — Z9629 Presence of other otological and audiological implants: Secondary | ICD-10-CM | POA: Diagnosis not present

## 2024-01-06 DIAGNOSIS — H6123 Impacted cerumen, bilateral: Secondary | ICD-10-CM | POA: Diagnosis not present

## 2024-01-06 DIAGNOSIS — H6983 Other specified disorders of Eustachian tube, bilateral: Secondary | ICD-10-CM

## 2024-01-06 DIAGNOSIS — H7201 Central perforation of tympanic membrane, right ear: Secondary | ICD-10-CM

## 2024-01-06 DIAGNOSIS — H903 Sensorineural hearing loss, bilateral: Secondary | ICD-10-CM

## 2024-01-06 NOTE — Progress Notes (Unsigned)
 Patient ID: Joshua Lara, male   DOB: 1945/07/18, 78 y.o.   MRN: [id number]  Follow-up: Bilateral hearing loss, eustachian tube dysfunction  HPI: The patient is a 78 year old male who returns today for his follow-up evaluation.  He was last seen 1 year ago.  At that time, he was complaining of bilateral hearing loss.  He was noted to have bilateral cerumen impaction, bilateral sensorineural hearing loss, and additional severe left ear conductive hearing loss.  He has a history of severe eustachian tube dysfunction, and was previously treated with myringotomy and tube placement.   He was fitted with bilateral hearing aids.  Currently he denies any otalgia, otorrhea, recent change in his hearing, or vertigo.  Exam: General: Communicates without difficulty, well nourished, no acute distress. Head: Normocephalic, no evidence injury, no tenderness, facial buttresses intact without stepoff. Face/sinus: No tenderness to palpation and percussion. Facial movement is normal and symmetric. Eyes: PERRL, EOMI. No scleral icterus, conjunctivae clear. Neuro: CN II exam reveals vision grossly intact.  No nystagmus at any point of gaze. EAC: Bilateral cerumen impaction.  Under the operating microscope, the cerumen is carefully removed with a combination of cerumen currette, alligator forceps, and suction catheters.  After the cerumen is removed, the right ventilating tube is in place and patent.  The left tympanic membrane is severely retracted. Nose: External evaluation reveals normal support and skin without lesions.  Dorsum is intact.  Anterior rhinoscopy reveals pink mucosa over anterior aspect of inferior turbinates and intact septum.  No purulence noted. Oral:  Oral cavity and oropharynx are intact, symmetric, without erythema or edema.  Mucosa is moist without lesions. Neck: Full range of motion without pain.  There is no significant lymphadenopathy.  No masses palpable.  Thyroid  bed within normal limits to  palpation.  Parotid glands and submandibular glands equal bilaterally without mass.  Trachea is midline. Neuro:  CN 2-12 grossly intact. Gait normal.   Procedure: Bilateral cerumen disimpaction Anesthesia: None Description: Under the operating microscope, the cerumen is carefully removed with a combination of cerumen currette, alligator forceps, and suction catheters.  After the cerumen is removed, the right ventilating tube is in place and patent.  The left tympanic membrane is severely retracted.  No mass, erythema, or lesions. The patient tolerated the procedure well.   Assessment  1.  Bilateral cerumen impaction.   2.  After the disimpaction procedure, the right ventilating tube is noted to be in place and patent.  The left tympanic membrane is noted to be severely retracted.  No infection is noted today.  3.  Subjectively stable severe left ear mixed hearing loss.  The patient also has right ear sensorineural hearing loss.    Plan: 1.  Otomicroscopy with bilateral cerumen disimpaction.  2.  The physical exam findings are reviewed with the patient.  3.  Continue the use of his hearing aids. 4.  Dry ear precautions on the right side. 5.  The patient will return for re-evaluation in 6 months.

## 2024-01-07 DIAGNOSIS — G4733 Obstructive sleep apnea (adult) (pediatric): Secondary | ICD-10-CM | POA: Diagnosis not present

## 2024-01-07 DIAGNOSIS — H7201 Central perforation of tympanic membrane, right ear: Secondary | ICD-10-CM | POA: Insufficient documentation

## 2024-01-07 DIAGNOSIS — H903 Sensorineural hearing loss, bilateral: Secondary | ICD-10-CM | POA: Insufficient documentation

## 2024-01-07 DIAGNOSIS — H6123 Impacted cerumen, bilateral: Secondary | ICD-10-CM | POA: Insufficient documentation

## 2024-01-07 DIAGNOSIS — H6983 Other specified disorders of Eustachian tube, bilateral: Secondary | ICD-10-CM | POA: Insufficient documentation

## 2024-01-19 DIAGNOSIS — J9611 Chronic respiratory failure with hypoxia: Secondary | ICD-10-CM | POA: Diagnosis not present

## 2024-01-19 DIAGNOSIS — G4733 Obstructive sleep apnea (adult) (pediatric): Secondary | ICD-10-CM | POA: Diagnosis not present

## 2024-01-19 DIAGNOSIS — I5020 Unspecified systolic (congestive) heart failure: Secondary | ICD-10-CM | POA: Diagnosis not present

## 2024-01-19 DIAGNOSIS — E1169 Type 2 diabetes mellitus with other specified complication: Secondary | ICD-10-CM | POA: Diagnosis not present

## 2024-01-19 DIAGNOSIS — M069 Rheumatoid arthritis, unspecified: Secondary | ICD-10-CM | POA: Diagnosis not present

## 2024-01-19 DIAGNOSIS — I10 Essential (primary) hypertension: Secondary | ICD-10-CM | POA: Diagnosis not present

## 2024-01-19 DIAGNOSIS — Z79899 Other long term (current) drug therapy: Secondary | ICD-10-CM | POA: Diagnosis not present

## 2024-01-19 DIAGNOSIS — K7460 Unspecified cirrhosis of liver: Secondary | ICD-10-CM | POA: Diagnosis not present

## 2024-01-19 DIAGNOSIS — K219 Gastro-esophageal reflux disease without esophagitis: Secondary | ICD-10-CM | POA: Diagnosis not present

## 2024-01-19 DIAGNOSIS — N400 Enlarged prostate without lower urinary tract symptoms: Secondary | ICD-10-CM | POA: Diagnosis not present

## 2024-01-19 DIAGNOSIS — E039 Hypothyroidism, unspecified: Secondary | ICD-10-CM | POA: Diagnosis not present

## 2024-01-19 DIAGNOSIS — I2510 Atherosclerotic heart disease of native coronary artery without angina pectoris: Secondary | ICD-10-CM | POA: Diagnosis not present

## 2024-01-19 DIAGNOSIS — I480 Paroxysmal atrial fibrillation: Secondary | ICD-10-CM | POA: Diagnosis not present

## 2024-01-25 ENCOUNTER — Ambulatory Visit: Admitting: Pulmonary Disease

## 2024-01-29 ENCOUNTER — Encounter: Payer: Self-pay | Admitting: Cardiovascular Disease

## 2024-01-29 ENCOUNTER — Ambulatory Visit: Attending: Cardiovascular Disease | Admitting: Cardiovascular Disease

## 2024-01-29 VITALS — BP 112/75 | HR 94 | Ht 70.0 in | Wt 250.0 lb

## 2024-01-29 DIAGNOSIS — I4891 Unspecified atrial fibrillation: Secondary | ICD-10-CM | POA: Diagnosis not present

## 2024-01-29 DIAGNOSIS — I4819 Other persistent atrial fibrillation: Secondary | ICD-10-CM | POA: Diagnosis not present

## 2024-01-29 NOTE — Patient Instructions (Signed)
 Medication Instructions:  Your physician recommends that you continue on your current medications as directed. Please refer to the Current Medication list given to you today.  *If you need a refill on your cardiac medications before your next appointment, please call your pharmacy*  Lab Work: None ordered  If you have any lab test that is abnormal or we need to change your treatment, we will call you to review the results.  Testing/Procedures: Your physician recommends a loop recorder implant.  We will contact you to schedule this.   Follow-Up: At [facility], you and your health needs are our priority.  As part of our continuing mission to provide you with exceptional heart care, our providers are all part of one team.  This team includes your primary Cardiologist (physician) and Advanced Practice Providers or APPs (Physician Assistants and Nurse Practitioners) who all work together to provide you with the care you need, when you need it.  Your next appointment:   To be determined  Provider:   Eulas Furbish, MD     Thank you for choosing [facility]!!   [phone]     Implantable Loop Recorder Placement, Care After This sheet gives you information about how to care for yourself after your procedure. Your health care provider may also give you more specific instructions. If you have problems or questions, contact your health care provider. What can I expect after the procedure? After the procedure, it is common to have: Soreness or discomfort near the incision. Some swelling or bruising near the incision.  Follow these instructions at home: Incision care  Monitor your cardiac device site for redness, swelling, and drainage. Call the device clinic at [phone] if you experience these symptoms or fever/chills.  Keep the large square bandage on your site for 24 hours and then you may remove it yourself. Keep the steri-strips underneath in place.   You  may shower after 72 hours / 3 days from your procedure with the steri-strips in place. They will usually fall off on their own, or may be removed after 10 days. Pat dry.   Avoid lotions, ointments, or perfumes over your incision until it is well-healed.  Please do not submerge in water until your site is completely healed.   Your device is MRI compatible.   Remote monitoring is used to monitor your cardiac device from home. This monitoring is scheduled every month by our office. It allows us  to keep an eye on the function of your device to ensure it is working properly.  If your wound site starts to bleed apply pressure.    For help with the monitor please call [employer] Monitor Support Specialist directly at [phone].    If you have any questions/concerns please call the device clinic at [phone].  Activity  Return to your normal activities.  General instructions Follow instructions from your health care provider about how to manage your implantable loop recorder and transmit the information. Learn how to activate a recording if this is necessary for your type of device. You may go through a metal detection gate, and you may let someone hold a metal detector over your chest. Show your ID card if needed. Do not have an MRI unless you check with your health care provider first. Take over-the-counter and prescription medicines only as told by your health care provider. Keep all follow-up visits as told by your health care provider. This is important. Contact a health care provider if: You have redness, swelling,  or pain around your incision. You have a fever. You have pain that is not relieved by your pain medicine. You have triggered your device because of fainting (syncope) or because of a heartbeat that feels like it is racing, slow, fluttering, or skipping (palpitations). Get help right away if you have: Chest pain. Difficulty breathing. Summary After the procedure, it is  common to have soreness or discomfort near the incision. Change your dressing as told by your health care provider. Follow instructions from your health care provider about how to manage your implantable loop recorder and transmit the information. Keep all follow-up visits as told by your health care provider. This is important. This information is not intended to replace advice given to you by your health care provider. Make sure you discuss any questions you have with your health care provider. Document Released: 04/09/2015 Document Revised: 06/13/2017 Document Reviewed: 06/13/2017 Elsevier Patient Education  2020 [employer].

## 2024-01-29 NOTE — Progress Notes (Signed)
 Electrophysiology Office Note:    Date:  01/29/2024   ID:  Joshua Lara, DOB Oct 04, 1945, MRN [id number]  PCP:  Montey Lot, PA-C   [facility] Providers Cardiologist:  Madonna Large, DO     Referring MD: Large Madonna, DO   History of Present Illness:    Joshua Lara is a 78 y.o. male with a medical history significant for persistent atrial fibrillation,, referred for persistent atrial fibrillation, CHF with improved EF, coronary disease with inferolateral STEMI s/p thrombectomy and stenting on November 29, 2022, sleep apnea on BiPAP.     His history includes heart failure  Discussed the use of AI scribe software for clinical note transcription with the patient, who gave verbal consent to proceed.  History of Present Illness Joshua Lara is a 78 year old male with persistent atrial fibrillation who presents for evaluation and management of his condition. He was referred by Dr. Large for management of atrial fibrillation.  He has persistent atrial fibrillation, first identified in August 2020 (see EKGs August 7). He was managed with sotalol  since 2020, which initially controlled his rhythm, but discontinued it due to QTc prolongation.  He has little recollection of his initial diagnosis of atrial fibrillation and denies having any symptoms directly attributable to atrial fibrillation such as episodic fatigue or palpitations.   Recent EKGs have shown what appears to be sinus tachycardia with rates around 120 bpm.  In July 2024, he experienced an infralateral STEMI while in atrial fibrillation and underwent stenting for an RCA thrombus. He required direct current cardioversion due to shock.   He has sleep apnea managed with BiPAP therapy and experiences fatigue, using a wheelchair for mobility. His father had atrial fibrillation.         Today, he reports that he has fatigue, often short of breath.  EKGs/Labs/Other Studies Reviewed Today:      Echocardiogram:  TTE December 07, 2022 LVEF 55 to 60%.  Grade 3 diastolic dysfunction.  Mild LVH.   Cardiac catherization  December 07, 2022 Reviewed in epic  EKG:   EKG Interpretation Date/Time:  Friday January 29 2024 13:58:19 EDT Ventricular Rate:  93 PR Interval:  214 QRS Duration:  94 QT Interval:  378 QTC Calculation: 469 R Axis:   113  Text Interpretation: Sinus rhythm with 1st degree A-V block with occasional Premature ventricular complexes Left posterior fascicular block Cannot rule out Anterior infarct , age undetermined When compared with ECG of 01-Jan-2024 14:13, Premature ventricular complexes are now Present Confirmed by Nancey Scotts [phone]) on 01/29/2024 2:16:25 PM     Physical Exam:    VS:  BP 112/75 (BP Location: Left Arm, Patient Position: Sitting, Cuff Size: Large)   Pulse 94   Ht 5' 10 (1.778 m)   Wt 250 lb (113.4 kg)   SpO2 96%   BMI 35.87 kg/m     Wt Readings from Last 3 Encounters:  01/29/24 250 lb (113.4 kg)  12/25/23 246 lb (111.6 kg)  09/24/23 251 lb 6.4 oz (114 kg)     GEN: Well nourished, well developed in no acute distress CARDIAC: RRR, no murmurs, rubs, gallops RESPIRATORY:  Normal work of breathing MUSCULOSKELETAL: no edema    ASSESSMENT & PLAN:     Atrial fibrillation 2 reported episodes --1 in 2020, a second during his STEMI in 2024 He has maintained sinus rhythm on sotalol , discontinued due to prolonged Qtc At this point, I think it worthwhile to reevaluate his burden of A-fib; and  loop recorder would be reasonable to manage atrial fibrillation I anticipate he will need an ablation in the future.  Antiarrhythmic choices are limited. Given that he has a difficult airway, however, I would like to have a better idea of his A-fib burden and rate control prior to the scheduled procedure  I explained the loop order and placement procedure including risks of minor bleeding and infection.  I explained the monitoring process with  the associated fee.  He would like to proceed.  Coronary artery disease Status post STEMI, PCI  CHF preserved EF Grade 3 diastolic dysfunction  Obstructive sleep apnea Continue BiPAP  Obesity Weight loss encouraged    Signed, Eulas FORBES Furbish, MD  01/29/2024 2:35 PM    [facility]

## 2024-02-04 DIAGNOSIS — G4733 Obstructive sleep apnea (adult) (pediatric): Secondary | ICD-10-CM | POA: Diagnosis not present

## 2024-02-08 DIAGNOSIS — H401112 Primary open-angle glaucoma, right eye, moderate stage: Secondary | ICD-10-CM | POA: Diagnosis not present

## 2024-02-08 DIAGNOSIS — H401123 Primary open-angle glaucoma, left eye, severe stage: Secondary | ICD-10-CM | POA: Diagnosis not present

## 2024-02-09 ENCOUNTER — Other Ambulatory Visit: Payer: Self-pay | Admitting: Cardiology

## 2024-02-09 DIAGNOSIS — E781 Pure hyperglyceridemia: Secondary | ICD-10-CM

## 2024-02-09 MED ORDER — FENOFIBRATE 145 MG PO TABS: 145.0000 mg | ORAL_TABLET | Freq: Every day | ORAL | 3 refills | Status: AC

## 2024-02-12 DIAGNOSIS — J449 Chronic obstructive pulmonary disease, unspecified: Secondary | ICD-10-CM | POA: Diagnosis not present

## 2024-02-19 DIAGNOSIS — H26493 Other secondary cataract, bilateral: Secondary | ICD-10-CM | POA: Diagnosis not present

## 2024-03-05 DIAGNOSIS — G4733 Obstructive sleep apnea (adult) (pediatric): Secondary | ICD-10-CM | POA: Diagnosis not present

## 2024-03-09 DIAGNOSIS — E119 Type 2 diabetes mellitus without complications: Secondary | ICD-10-CM | POA: Diagnosis not present

## 2024-03-09 DIAGNOSIS — H52203 Unspecified astigmatism, bilateral: Secondary | ICD-10-CM | POA: Diagnosis not present

## 2024-03-09 DIAGNOSIS — H43813 Vitreous degeneration, bilateral: Secondary | ICD-10-CM | POA: Diagnosis not present

## 2024-03-09 DIAGNOSIS — H401123 Primary open-angle glaucoma, left eye, severe stage: Secondary | ICD-10-CM | POA: Diagnosis not present

## 2024-03-09 DIAGNOSIS — H26491 Other secondary cataract, right eye: Secondary | ICD-10-CM | POA: Diagnosis not present

## 2024-03-09 DIAGNOSIS — H524 Presbyopia: Secondary | ICD-10-CM | POA: Diagnosis not present

## 2024-03-15 DIAGNOSIS — H26491 Other secondary cataract, right eye: Secondary | ICD-10-CM | POA: Diagnosis not present

## 2024-03-25 ENCOUNTER — Ambulatory Visit: Admitting: Cardiovascular Disease

## 2024-03-25 ENCOUNTER — Ambulatory Visit: Admitting: Pulmonary Disease

## 2024-03-25 NOTE — Progress Notes (Deleted)
 Electrophysiology Office Note:    Date:  03/25/2024   ID:  Joshua Lara, DOB 10/13/1945, MRN [id number]  PCP:  Montey Lot, PA-C   [facility] Providers Cardiologist:  Madonna Large, DO     Referring MD: Montey Lot, PA-C   History of Present Illness:    Joshua Lara is a 78 y.o. male with a medical history significant for persistent atrial fibrillation,, referred for persistent atrial fibrillation, CHF with improved EF, coronary disease with inferolateral STEMI s/p thrombectomy and stenting on November 29, 2022, sleep apnea on BiPAP.     His history includes heart failure  Discussed the use of AI scribe software for clinical note transcription with the patient, who gave verbal consent to proceed.  History of Present Illness Joshua Lara is a 78 year old male with persistent atrial fibrillation who presents for evaluation and management of his condition. He was referred by Dr. Large for management of atrial fibrillation.  He has persistent atrial fibrillation, first identified in August 2020 (see EKGs August 7). He was managed with sotalol  since 2020, which initially controlled his rhythm, but discontinued it due to QTc prolongation.  He has little recollection of his initial diagnosis of atrial fibrillation and denies having any symptoms directly attributable to atrial fibrillation such as episodic fatigue or palpitations.   Recent EKGs have shown what appears to be sinus tachycardia with rates around 120 bpm.  In July 2024, he experienced an infralateral STEMI while in atrial fibrillation and underwent stenting for an RCA thrombus. He required direct current cardioversion due to shock.   He has sleep apnea managed with BiPAP therapy and experiences fatigue, using a wheelchair for mobility. His father had atrial fibrillation.         Today, he reports that he has fatigue, often short of breath.  EKGs/Labs/Other Studies Reviewed Today:      Echocardiogram:  TTE December 07, 2022 LVEF 55 to 60%.  Grade 3 diastolic dysfunction.  Mild LVH.   Cardiac catherization  December 07, 2022 Reviewed in epic  EKG:         Physical Exam:    VS:  There were no vitals taken for this visit.    Wt Readings from Last 3 Encounters:  01/29/24 250 lb (113.4 kg)  12/25/23 246 lb (111.6 kg)  09/24/23 251 lb 6.4 oz (114 kg)     GEN: Well nourished, well developed in no acute distress CARDIAC: RRR, no murmurs, rubs, gallops RESPIRATORY:  Normal work of breathing MUSCULOSKELETAL: no edema    ASSESSMENT & PLAN:     Atrial fibrillation 2 reported episodes --1 in 2020, a second during his STEMI in 2024 He has maintained sinus rhythm on sotalol , discontinued due to prolonged Qtc At this point, I think it worthwhile to reevaluate his burden of A-fib; and loop recorder would be reasonable to manage atrial fibrillation I anticipate he will need an ablation in the future.  Antiarrhythmic choices are limited. Given that he has a difficult airway, however, I would like to have a better idea of his A-fib burden and rate control prior to the scheduled procedure  I explained the loop order and placement procedure including risks of minor bleeding and infection.  I explained the monitoring process with the associated fee.  He would like to proceed.  Coronary artery disease Status post STEMI, PCI  CHF preserved EF Grade 3 diastolic dysfunction  Obstructive sleep apnea Continue BiPAP  Obesity Weight loss encouraged  Signed, Eulas FORBES Furbish, MD  03/25/2024 9:39 AM    [facility]

## 2024-03-30 ENCOUNTER — Ambulatory Visit: Attending: Cardiology | Admitting: Cardiology

## 2024-03-30 ENCOUNTER — Encounter: Payer: Self-pay | Admitting: Cardiology

## 2024-03-30 VITALS — BP 110/72 | HR 95 | Resp 16 | Ht 70.0 in | Wt 251.0 lb

## 2024-03-30 DIAGNOSIS — I7121 Aneurysm of the ascending aorta, without rupture: Secondary | ICD-10-CM

## 2024-03-30 DIAGNOSIS — I2510 Atherosclerotic heart disease of native coronary artery without angina pectoris: Secondary | ICD-10-CM

## 2024-03-30 DIAGNOSIS — Z794 Long term (current) use of insulin: Secondary | ICD-10-CM

## 2024-03-30 DIAGNOSIS — I4819 Other persistent atrial fibrillation: Secondary | ICD-10-CM

## 2024-03-30 DIAGNOSIS — E039 Hypothyroidism, unspecified: Secondary | ICD-10-CM | POA: Diagnosis not present

## 2024-03-30 DIAGNOSIS — E119 Type 2 diabetes mellitus without complications: Secondary | ICD-10-CM

## 2024-03-30 DIAGNOSIS — Z7901 Long term (current) use of anticoagulants: Secondary | ICD-10-CM | POA: Diagnosis not present

## 2024-03-30 DIAGNOSIS — I252 Old myocardial infarction: Secondary | ICD-10-CM | POA: Diagnosis not present

## 2024-03-30 DIAGNOSIS — E7800 Pure hypercholesterolemia, unspecified: Secondary | ICD-10-CM

## 2024-03-30 DIAGNOSIS — E1142 Type 2 diabetes mellitus with diabetic polyneuropathy: Secondary | ICD-10-CM | POA: Diagnosis not present

## 2024-03-30 DIAGNOSIS — E781 Pure hyperglyceridemia: Secondary | ICD-10-CM | POA: Diagnosis not present

## 2024-03-30 DIAGNOSIS — Z955 Presence of coronary angioplasty implant and graft: Secondary | ICD-10-CM

## 2024-03-30 DIAGNOSIS — E1136 Type 2 diabetes mellitus with diabetic cataract: Secondary | ICD-10-CM | POA: Diagnosis not present

## 2024-03-30 DIAGNOSIS — Z789 Other specified health status: Secondary | ICD-10-CM | POA: Diagnosis not present

## 2024-03-30 DIAGNOSIS — I5020 Unspecified systolic (congestive) heart failure: Secondary | ICD-10-CM | POA: Diagnosis not present

## 2024-03-30 DIAGNOSIS — H409 Unspecified glaucoma: Secondary | ICD-10-CM | POA: Diagnosis not present

## 2024-03-30 DIAGNOSIS — G4733 Obstructive sleep apnea (adult) (pediatric): Secondary | ICD-10-CM | POA: Diagnosis not present

## 2024-03-30 NOTE — Patient Instructions (Addendum)
 Medication Instructions:  Your physician recommends that you continue on your current medications as directed. Please refer to the Current Medication list given to you today.  *If you need a refill on your cardiac medications before your next appointment, please call your pharmacy*  Lab Work: None ordered If you have labs (blood work) drawn today and your tests are completely normal, you will receive your results only by: MyChart Message (if you have MyChart) OR A paper copy in the mail If you have any lab test that is abnormal or we need to change your treatment, we will call you to review the results.  Testing/Procedures: None ordered  Follow-Up: At [facility], you and your health needs are our priority.  As part of our continuing mission to provide you with exceptional heart care, our providers are all part of one team.  This team includes your primary Cardiologist (physician) and Advanced Practice Providers or APPs (Physician Assistants and Nurse Practitioners) who all work together to provide you with the care you need, when you need it.  Your next appointment:   9 month(s)  Provider:   Madonna Large, DO    We recommend signing up for the patient portal called MyChart.  Sign up information is provided on this After Visit Summary.  MyChart is used to connect with patients for Virtual Visits (Telemedicine).  Patients are able to view lab/test results, encounter notes, upcoming appointments, etc.  Non-urgent messages can be sent to your provider as well.   To learn more about what you can do with MyChart, go to [email or web address].

## 2024-03-30 NOTE — Progress Notes (Signed)
 Cardiology Office Note:  .   Date:  03/30/2024  ID:  Joshua Lara, DOB 21-Jul-1945, MRN [id number] PCP:  Montey Lot, PA-C  Former Cardiology Providers: Emmalene Lawrence, APRN, FNP-C  [facility] Providers Cardiologist:  Madonna Large, DO , [facility] (established care 10/28/2019) Electrophysiologist:  None  Click to update primary MD,subspecialty MD or APP then REFRESH:1}    Chief Complaint  Patient presents with   Atherosclerosis of native coronary artery of native heart w   Atrial Fibrillation   Follow-up    History of Present Illness: .   Joshua Lara is a 78 y.o. Caucasian male whose past medical history and cardiovascular risk factors includes: Acute inferolateral STEMI status post thrombectomy and stenting (December 07, 2022), persistent atrial fibrillation (status post cardioversion July 2024), heart failure with improved EF, chronic HFpEF, ascending aortic aneurysm 42 mm (CT Chest 07/04/2022), hypertension, hyperlipidemia, OSA on BiPAP, diabetes mellitus, glaucoma, obesity due to excess calories, advanced age.   Patient is being followed by the practice for HFpEF, A-fib, CAD status post STEMI July 2024, hyperlipidemia with statin intolerance.  In July 2024 he presented to [facility] and noted to have inferolateral STEMI on presentation.  Taken to the Cath Lab emergently underwent aspiration thrombectomy followed by direct stenting.  Since then we have been focusing on improving his modifiable cardiovascular risk factors.  Given his persistent atrial fibrillation he was on sotalol  for rate and rhythm control strategy as well as anticoagulation for thromboembolic prophylaxis.   In August 2025 at follow up we discontinued Sototoal due to concerns for prolong QT.  He was referred to EP for further evaluation and management.  Patient did see EP in September 2025 and a shared decision was to proceed forward with loop recorder implant to monitor for A-fib burden prior to  subjecting him for catheter directed ablation given his airway.  Patient is scheduled to have ILR placed next month.  Clinically denies anginal chest pain.  Shortness of breath is chronic and stable as it is multifactorial. Clinically he is less tired and fatigued. No use of sublingual nitroglycerin  tablets since the last office visit. Patient informs me that his job site is closing and likely will be retiring either this weekend or in the coming week.  Review of Systems: .   Review of Systems  Cardiovascular:  Positive for dyspnea on exertion (Chronic and stable). Negative for chest pain, claudication, irregular heartbeat, leg swelling, near-syncope, orthopnea, paroxysmal nocturnal dyspnea and syncope.  Hematologic/Lymphatic: Negative for bleeding problem.    Studies Reviewed:   EKG: EKG Interpretation Date/Time:  Wednesday March 30 2024 15:14:23 EST Ventricular Rate:  98 PR Interval:  208 QRS Duration:  94 QT Interval:  374 QTC Calculation: 477 R Axis:   117  Text Interpretation: Normal sinus rhythm Left posterior fascicular block Consider Anterior infarct (cited on or before 29-Jan-2024) Nonspecific ST abnormality When compared with ECG of 29-Jan-2024 13:58, Premature ventricular complexes are no longer Present Confirmed by Large Madonna [phone]) on 03/30/2024 3:18:42 PM   Echocardiogram: 01/14/2019: LVEF 45-50%, calculated LVEF 40%, grade 1 diastolic impairment, mild LAE, see report for details.   11/2022 1. Left ventricular ejection fraction, by estimation, is 55 to 60%. The left ventricle has normal function. The left ventricle has no regional wall motion abnormalities. There is mild left ventricular hypertrophy. Left ventricular diastolic parameters  are consistent with Grade III diastolic dysfunction (restrictive). Elevated left ventricular end-diastolic pressure.  2. Right ventricular systolic function is normal. The  right ventricular size is normal.  3. The mitral valve is  normal in structure. No evidence of mitral valve regurgitation. No evidence of mitral stenosis.  4. The aortic valve is grossly normal. Aortic valve regurgitation is not visualized. Aortic valve sclerosis is present, with no evidence of aortic valve stenosis.  5. Aortic dilatation noted. There is dilatation of the ascending aorta, measuring 39 mm. There is dilatation of the aortic root, measuring 43 mm.  6. The inferior vena cava is normal in size with greater than 50% respiratory variability, suggesting right atrial pressure of 3 mmHg.  Left Heart Catheterization 12/07/22:  LV: 109/19, EDP 27 mmHg.  Ao: 1 6/61, mean 76 mmHg.  No pressure gradient across the aortic valve on 10 mcg/kg/min of dopamine . LM: Large-caliber vessel.  Smooth and normal. LAD: Large-caliber vessel, gives origin to large D1.  Smooth and normal. Ramus intermediate: Moderate caliber vessel.  Smooth and normal. CX: Moderate caliber vessel.  Smooth and normal. RCA: Large-caliber vessel, gives origin to a large PDA and 2 small PL branches originating close by to each other, there is an ulcerated 100% occlusion of the distal right coronary artery.    Intervention data: Successful thrombectomy with Pronto aspiration followed by direct stenting with 3.5 x 24 mm Synergy XD DES at 13 atmospheric pressure, stenosis reduced from 100% to 0% TIMI-0 flow improved to TIMI-3 flow.  RADIOLOGY: CT chest w/o contrast 11/08/2023 1. No CT evidence for acute intrathoracic abnormality. 2. Mild emphysema. 3. Mild aneurysmal dilatation of the ascending aorta measuring up to 4.1 cm, previously 4.2 cm. Recommend annual imaging followup by CTA or MRA. This recommendation follows 2010 ACCF/AHA/AATS/ACR/ASA/SCA/SCAI/SIR/STS/SVM Guidelines for the Diagnosis and Management of Patients with Thoracic Aortic Disease. Circulation. 2010; 121: Z733-z630. Aortic aneurysm NOS (ICD10-I71.9) 4. Possible cirrhosis.   Aortic Atherosclerosis (ICD10-I70.0) and  Emphysema (ICD10-J43.9).  Risk Assessment/Calculations:   Click Here to Calculate/Change CHADS2VASc Score The patient's CHADS2-VASc score is 6, indicating a 9.7% annual risk of stroke.   CHF History: Yes HTN History: Yes Diabetes History: Yes Stroke History: No Vascular Disease History: Yes  Labs:       Latest Ref Rng & Units 12/07/2022    4:50 AM 12/07/2022   12:24 AM 12/06/2022   11:34 PM  CBC  WBC 4.0 - 10.5 K/uL 8.3   8.5   Hemoglobin 13.0 - 17.0 g/dL 85.7  85.6  85.4   Hematocrit 39.0 - 52.0 % 41.3  42.0  42.2   Platelets 150 - 400 K/uL 216   251        Latest Ref Rng & Units 12/25/2023    4:21 PM 12/12/2022    1:58 PM 12/09/2022    3:34 AM  BMP  Glucose 70 - 99 mg/dL 81  823  771   BUN 8 - 27 mg/dL 15  13  24    Creatinine 0.76 - 1.27 mg/dL 8.91  8.95  8.62   BUN/Creat Ratio 10 - 24 14  13     Sodium 134 - 144 mmol/L 142  139  136   Potassium 3.5 - 5.2 mmol/L 3.8  4.1  4.0   Chloride 96 - 106 mmol/L 102  102  101   CO2 20 - 29 mmol/L 24  25  26    Calcium  8.6 - 10.2 mg/dL 89.7  9.7  9.2       Latest Ref Rng & Units 12/25/2023    4:21 PM 12/12/2022    1:58 PM 12/09/2022    3:34  AM  CMP  Glucose 70 - 99 mg/dL 81  823  771   BUN 8 - 27 mg/dL 15  13  24    Creatinine 0.76 - 1.27 mg/dL 8.91  8.95  8.62   Sodium 134 - 144 mmol/L 142  139  136   Potassium 3.5 - 5.2 mmol/L 3.8  4.1  4.0   Chloride 96 - 106 mmol/L 102  102  101   CO2 20 - 29 mmol/L 24  25  26    Calcium  8.6 - 10.2 mg/dL 89.7  9.7  9.2     Lab Results  Component Value Date   CHOL 93 12/08/2022   HDL 39 (L) 12/08/2022   LDLCALC 30 12/08/2022   LDLDIRECT 35 12/08/2022   TRIG 118 12/08/2022   CHOLHDL 2.4 12/08/2022   No results for input(s): LIPOA in the last 8760 hours.  No components found for: NTPROBNP No results for input(s): PROBNP in the last 8760 hours.  No results for input(s): TSH in the last 8760 hours.  External Labs: Collected: 06/07/2021 provided by PCP. BUN 11, creatinine  0.9. eGFR 90. Sodium 141, potassium 4, chloride 103, bicarb 27 AST 23, ALT 27, alkaline phosphatase 113 Hemoglobin 16.9 g/dL, hematocrit 49.6% BNP 8.2 Total cholesterol 173, triglycerides 259, HDL 42, LDL 88   External Labs: Collected: 08/13/2022 provided by PCP BUN 16, creatinine 1.08. BUN to creatinine ratio 15. Sodium 135, potassium 4.4, chloride 99, bicarb 23. AST 26, ALT 24, alkaline phosphatase 64 Hemoglobin 15.1, hematocrit 43.9% BNP 4.4  Physical Exam:    Today's Vitals   03/30/24 1510  BP: 110/72  Pulse: 95  Resp: 16  SpO2: 98%  Weight: 251 lb (113.9 kg)  Height: 5' 10 (1.778 m)   Body mass index is 36.01 kg/m. Wt Readings from Last 3 Encounters:  03/30/24 251 lb (113.9 kg)  01/29/24 250 lb (113.4 kg)  12/25/23 246 lb (111.6 kg)    Physical Exam  Constitutional: No distress.  hemodynamically stable  Neck: No JVD present.  Cardiovascular: Normal rate, regular rhythm, S1 normal and S2 normal. Exam reveals no gallop, no S3 and no S4.  No murmur heard. Pulmonary/Chest: Effort normal and breath sounds normal. No stridor. He has no wheezes. He has no rales.  Abdominal: Soft. He exhibits no distension. There is no abdominal tenderness.  Musculoskeletal:        General: No edema.     Cervical back: Neck supple.  Skin: Skin is warm.     Impression & Recommendation(s):  Impression:   ICD-10-CM   1. Atherosclerosis of native coronary artery of native heart without angina pectoris  I25.10 EKG 12-Lead    2. History of ST elevation myocardial infarction (STEMI)  I25.2     3. Coronary stent patent  Z95.5     4. Heart failure with improved ejection fraction (HFimpEF) (HCC)  I50.20     5. Aneurysm of ascending aorta without rupture  I71.21     6. Persistent atrial fibrillation (HCC)  I48.19 EKG 12-Lead    7. Long term (current) use of anticoagulants  Z79.01     8. Type 2 diabetes mellitus without complication, with long-term current use of insulin  (HCC)   E11.9    Z79.4     9. Hypertriglyceridemia  E78.1     10. Pure hypercholesterolemia  E78.00     11. Statin intolerance  Z78.9     12. OSA treated with BiPAP  G47.33  Recommendation(s):  Atherosclerosis of native coronary artery of native heart without angina pectoris History of ST elevation myocardial infarction (STEMI) Coronary stent patent Denies anginal chest pain or heart failure symptoms. EKG is nonischemic. No use of sublingual nitroglycerin  tablets since the last office visit. Continue aspirin  81 mg p.o. daily Continue Repatha , fenofibrate .  Heart failure with improved ejection fraction (HFimpEF) (HCC) Stage C, NYHA class II/III July 2021 LVEF 45-50% see report for additional details. July 2024: LVEF 55 to 60%, grade 3 diastolic dysfunction, see report for details Uptitration of GDMT has been difficult due to soft blood pressures. Of note Jardiance were discontinued in the past secondary to yeast infection. Spironolactone  discontinued by PCP due to soft blood pressures Continue losartan  25 mg p.o. every afternoon. Continue metoprolol  succinate 25 mg p.o. daily  Aneurysm of ascending aorta without rupture [facility]) CT chest at [facility] February 2024-per report ascending aorta 42 mm. CT chest without contrast 10/2023 -stable ascending aorta dilatation at 41 mm. Blood pressures are well-controlled/soft Repeat CT chest tentatively scheduled for June 2026, orders have been placed.  Advised to call us  back by May 2026 if he has not had an appointment initiated.  I will see him back in the office after the CT is completed. Avoid medications such as Ciprofloxacillin, uncontrolled hypertension, avoid heavy lifting that may increase intra-abdominal pressures or require Valsalva maneuver.  Persistent atrial fibrillation (HCC) Long term (current) use of anticoagulants Long term current use of antiarrhythmic drug Prolonged QT History of cardioversion Rate control:  Toprol -XL. Rhythm control: N/A. Thromboembolic prophylaxis: Eliquis  Does not endorse evidence of bleeding. Antiarrhythmic history: Sotalol  discontinued due to prolonged QT Has been seen by EP with consideration of loop implant to monitor burden of A-fib prior to discussing/considering catheter directed ablation  Type 2 diabetes mellitus without complication, with long-term current use of insulin  (HCC) With complications of glaucoma, CHF, CAD Currently on Ozempic , Repatha , fenofibrate , and losartan    Pure hypercholesterolemia Hypertriglyceridemia Statin intolerance Currently on Repatha  140 mg subcu injection every 14 days. Continue current medical therapy  OSA treated with BiPAP Reemphasized importance of compliance.  Orders Placed:  Orders Placed This Encounter  Procedures   EKG 12-Lead     Final Medication List:    No orders of the defined types were placed in this encounter.   There are no discontinued medications.    Current Outpatient Medications:    acetaminophen  (TYLENOL ) 650 MG CR tablet, Take 650 mg by mouth every 8 (eight) hours as needed for pain., Disp: , Rfl:    albuterol  (VENTOLIN  HFA) 108 (90 Base) MCG/ACT inhaler, Inhale 1-2 puffs into the lungs every 4 (four) hours as needed for wheezing or shortness of breath., Disp: , Rfl:    amoxicillin -clavulanate (AUGMENTIN ) 875-125 MG tablet, Take 1 tablet by mouth 2 (two) times daily., Disp: 14 tablet, Rfl: 0   Armodafinil 150 MG tablet, Take 150 mg by mouth daily., Disp: , Rfl:    aspirin  EC 81 MG tablet, Take 1 tablet (81 mg total) by mouth daily. Swallow whole., Disp: , Rfl:    BD PEN NEEDLE NANO 2ND GEN 32G X 4 MM MISC, , Disp: , Rfl:    clobetasol cream (TEMOVATE) 0.05 %, Apply 1 Application topically as needed (breakouts on left arm)., Disp: , Rfl:    Continuous Glucose Sensor (FREESTYLE LIBRE 2 SENSOR) MISC, Inject 1 Device into the skin every 14 (fourteen) days., Disp: , Rfl:    cyanocobalamin (VITAMIN B12)  500 MCG tablet, Take 500 mcg  by mouth daily., Disp: , Rfl:    ELIQUIS  5 MG TABS tablet, TAKE 1 TABLET BY MOUTH TWICE A DAY, Disp: 60 tablet, Rfl: 5   Evolocumab  (REPATHA  SURECLICK) 140 MG/ML SOAJ, INJECT 140 MG INTO THE SKIN EVERY 14 (FOURTEEN) DAYS., Disp: 6 mL, Rfl: 3   fenofibrate  (TRICOR ) 145 MG tablet, Take 1 tablet (145 mg total) by mouth daily., Disp: 90 tablet, Rfl: 3   fluticasone  (FLONASE ) 50 MCG/ACT nasal spray, Place 1 spray into both nostrils 2 (two) times daily., Disp: , Rfl:    Fluticasone -Umeclidin-Vilant (TRELEGY ELLIPTA ) 200-62.5-25 MCG/ACT AEPB, Inhale 1 puff into the lungs daily., Disp: 60 each, Rfl: 5   folic acid  (FOLVITE ) 1 MG tablet, Take 1 mg by mouth daily., Disp: , Rfl:    HUMULIN R  U-500 KWIKPEN 500 UNIT/ML kwikpen, Inject 125-150 Units into the skin 2 (two) times daily. , Disp: , Rfl:    latanoprost  (XALATAN ) 0.005 % ophthalmic solution, Place 1 drop into both eyes in the morning and at bedtime., Disp: , Rfl:    levothyroxine  (SYNTHROID , LEVOTHROID) 100 MCG tablet, Take 100 mcg by mouth daily., Disp: , Rfl:    losartan  (COZAAR ) 25 MG tablet, Take 1 tablet (25 mg total) by mouth every evening., Disp: 90 tablet, Rfl: 3   methotrexate 2.5 MG tablet, Take 2.5 mg by mouth once a week., Disp: , Rfl:    metoprolol  succinate (TOPROL -XL) 25 MG 24 hr tablet, Take 1 tablet (25 mg total) by mouth daily. HOLD if systolic blood pressure (top number) is less than 100 and/ or heart rate is less than 55., Disp: 30 tablet, Rfl: 3   Multiple Vitamins-Minerals (CENTRUM SILVER PO), Take 1 tablet by mouth daily., Disp: , Rfl:    nitroGLYCERIN  (NITROSTAT ) 0.4 MG SL tablet, Place 1 tablet (0.4 mg total) under the tongue every 5 (five) minutes as needed for chest pain. If you require more than two tablets five minutes apart go to the nearest ER via EMS., Disp: 30 tablet, Rfl: 0   pantoprazole  (PROTONIX ) 40 MG tablet, Take 40 mg by mouth daily., Disp: , Rfl:    pravastatin  (PRAVACHOL ) 40 MG  tablet, TAKE 1 TABLET BY MOUTH DAILY AT 6 PM., Disp: 90 tablet, Rfl: 3   Semaglutide , 1 MG/DOSE, 4 MG/3ML SOPN, Inject 1 mg into the skin once a week., Disp: 3 mL, Rfl: 11   Vitamin D, Ergocalciferol, (DRISDOL) 1.25 MG (50000 UNIT) CAPS capsule, Take 50,000 Units by mouth once a week., Disp: , Rfl:   Consent:   NA  Disposition:   July 2026  His questions and concerns were addressed to his satisfaction. He voices understanding of the recommendations provided during this encounter.    Signed, Madonna Michele HAS, [facility]  A Division of [facility] [address], [place], [place] [address]  03/30/2024 6:52 PM

## 2024-05-03 ENCOUNTER — Encounter: Payer: Self-pay | Admitting: Cardiovascular Disease

## 2024-05-03 ENCOUNTER — Ambulatory Visit: Attending: Cardiovascular Disease | Admitting: Cardiovascular Disease

## 2024-05-03 VITALS — BP 134/84 | HR 105 | Ht 70.0 in | Wt 249.0 lb

## 2024-05-03 DIAGNOSIS — I4819 Other persistent atrial fibrillation: Secondary | ICD-10-CM | POA: Diagnosis not present

## 2024-05-03 DIAGNOSIS — R9431 Abnormal electrocardiogram [ECG] [EKG]: Secondary | ICD-10-CM

## 2024-05-03 NOTE — Patient Instructions (Signed)
Medication Instructions:  Your physician recommends that you continue on your current medications as directed. Please refer to the Current Medication list given to you today.  Labwork: None ordered.  Testing/Procedures: None ordered.  Follow-Up:  Your physician wants you to follow-up in: one year with EP APP.  You will receive a reminder letter in the mail two months in advance. If you don't receive a letter, please call our office to schedule the follow-up appointment.    Implantable Loop Recorder Placement, Care After This sheet gives you information about how to care for yourself after your procedure. Your health care provider may also give you more specific instructions. If you have problems or questions, contact your health care provider. What can I expect after the procedure? After the procedure, it is common to have: Soreness or discomfort near the incision. Some swelling or bruising near the incision.  Follow these instructions at home: Incision care  Monitor your cardiac device site for redness, swelling, and drainage. Call the device clinic at [phone] if you experience these symptoms or fever/chills.  Keep the large square bandage on your site for 24 hours and then you may remove it yourself. Keep the steri-strips underneath in place.   You may shower after 72 hours / 3 days from your procedure with the steri-strips in place. They will usually fall off on their own, or may be removed after 10 days. Pat dry.   Avoid lotions, ointments, or perfumes over your incision until it is well-healed.  Please do not submerge in water until your site is completely healed.   Your device is MRI compatible.   Remote monitoring is used to monitor your cardiac device from home. This monitoring is scheduled every month by our office. It allows us to keep an eye on the function of your device to ensure it is working properly.  If your wound site starts to bleed apply pressure.     For help with the monitor please call [employer] Monitor Support Specialist directly at [phone].    If you have any questions/concerns please call the device clinic at [phone].  Activity  Return to your normal activities.  General instructions Follow instructions from your health care provider about how to manage your implantable loop recorder and transmit the information. Learn how to activate a recording if this is necessary for your type of device. You may go through a metal detection gate, and you may let someone hold a metal detector over your chest. Show your ID card if needed. Do not have an MRI unless you check with your health care provider first. Take over-the-counter and prescription medicines only as told by your health care provider. Keep all follow-up visits as told by your health care provider. This is important. Contact a health care provider if: You have redness, swelling, or pain around your incision. You have a fever. You have pain that is not relieved by your pain medicine. You have triggered your device because of fainting (syncope) or because of a heartbeat that feels like it is racing, slow, fluttering, or skipping (palpitations). Get help right away if you have: Chest pain. Difficulty breathing. Summary After the procedure, it is common to have soreness or discomfort near the incision. Change your dressing as told by your health care provider. Follow instructions from your health care provider about how to manage your implantable loop recorder and transmit the information. Keep all follow-up visits as told by your health care provider. This is important. This information   is not intended to replace advice given to you by your health care provider. Make sure you discuss any questions you have with your health care provider. Document Released: 04/09/2015 Document Revised: 06/13/2017 Document Reviewed: 06/13/2017 Elsevier Patient Education  2020 [employer]  

## 2024-05-03 NOTE — Progress Notes (Signed)
 " Electrophysiology Office Note:    Date:  05/03/2024   ID:  ENRIQUE MANGANARO, DOB July 26, 1945, MRN [id number]  PCP:  Montey Lot, PA-C   [facility] Providers Cardiologist:  Madonna Large, DO Electrophysiologist:  Eulas FORBES Furbish, MD     Referring MD: Montey Lot, PA-C   History of Present Illness:    COURTNEY FENLON is a 78 y.o. male with a medical history significant for persistent atrial fibrillation,, referred for persistent atrial fibrillation, CHF with improved EF, coronary disease with inferolateral STEMI s/p thrombectomy and stenting on November 29, 2022, sleep apnea on BiPAP.     His history includes heart failure  Discussed the use of AI scribe software for clinical note transcription with the patient, who gave verbal consent to proceed.  History of Present Illness JOBANI SABADO is a 78 year old male with persistent atrial fibrillation who presents for evaluation and management of his condition. He was referred by Dr. Large for management of atrial fibrillation.  He has persistent atrial fibrillation, first identified in August 2020 (see EKGs August 7). He was managed with sotalol  since 2020, which initially controlled his rhythm, but discontinued it due to QTc prolongation.  He has little recollection of his initial diagnosis of atrial fibrillation and denies having any symptoms directly attributable to atrial fibrillation such as episodic fatigue or palpitations.   Recent EKGs have shown what appears to be sinus tachycardia with rates around 120 bpm.  In July 2024, he experienced an infralateral STEMI while in atrial fibrillation and underwent stenting for an RCA thrombus. He required direct current cardioversion due to shock.   He has sleep apnea managed with BiPAP therapy and experiences fatigue, using a wheelchair for mobility. His father had atrial fibrillation.         Today, he reports that he has fatigue, often short of breath.  EKGs/Labs/Other  Studies Reviewed Today:     Echocardiogram:  TTE December 07, 2022 LVEF 55 to 60%.  Grade 3 diastolic dysfunction.  Mild LVH.   Cardiac catherization  December 07, 2022 Reviewed in epic  EKG:         Physical Exam:    VS:  BP 134/84 (BP Location: Right Arm, Patient Position: Sitting, Cuff Size: Large)   Pulse (!) 105   Ht 5' 10 (1.778 m)   Wt 249 lb (112.9 kg)   SpO2 97%   BMI 35.73 kg/m     Wt Readings from Last 3 Encounters:  05/03/24 249 lb (112.9 kg)  03/30/24 251 lb (113.9 kg)  01/29/24 250 lb (113.4 kg)     GEN: Well nourished, well developed in no acute distress CARDIAC: RRR, no murmurs, rubs, gallops RESPIRATORY:  Normal work of breathing MUSCULOSKELETAL: no edema    ASSESSMENT & PLAN:     Atrial fibrillation 2 reported episodes --1 in 2020, a second during his STEMI in 2024 He has maintained sinus rhythm on sotalol , discontinued due to prolonged Qtc At this point, I think it worthwhile to reevaluate his burden of A-fib; and loop recorder would be reasonable to manage atrial fibrillation I anticipate he will need an ablation in the future.  Antiarrhythmic choices are limited. Given that he has a difficult airway, however, I would like to have a better idea of his A-fib burden and rate control prior to the scheduled procedure  I explained the loop order and placement procedure including risks of minor bleeding and infection.  I explained the monitoring process with  the associated fee.  He would like to proceed.  Coronary artery disease Status post STEMI, PCI  CHF preserved EF Grade 3 diastolic dysfunction  Obstructive sleep apnea Continue BiPAP  Obesity Weight loss encouraged  Signed, Eulas FORBES Furbish, MD  05/03/2024 2:06 PM    [facility]   SURGEON:  Eulas FORBES Furbish, MD     PREPROCEDURE DIAGNOSIS:  Atrial fibrillation    POSTPROCEDURE DIAGNOSIS: Atrial fibrillation     PROCEDURES:   1. Implantable loop recorder  implantation    INTRODUCTION:  ZYKEEM BAUSERMAN presents with a history of atrial fibrillation The costs of loop recorder monitoring have been discussed with the patient.    DESCRIPTION OF PROCEDURE:  Informed written consent was obtained.  A timeout was performed. The patient required no sedation for the procedure today. The patients left chest was prepped and draped in the usual sterile fashion. The skin overlying the left parasternal region was infiltrated with lidocaine  for local analgesia.  A 0.5-cm incision was made over the left parasternal region over the 3rd intercostal space.  A subcutaneous ILR pocket was fashioned using a combination of sharp and blunt dissection.  A Med[employer]onic Reveal LINQ 2 (SN# [id number] ) implantable loop recorder was then placed into the pocket  R waves were very prominent and measured >0.74mV.  Steri- Strips and a sterile dressing were then applied.  There were no early apparent complications.     CONCLUSIONS:   1. Successful implantation of a implantable loop recorder for a history of atrial fibrillation  2. No early apparent complications.   Eulas FORBES Furbish, MD  Cardiac Electrophysiology    "

## 2024-05-25 ENCOUNTER — Other Ambulatory Visit: Payer: Self-pay | Admitting: Cardiology

## 2024-05-25 DIAGNOSIS — I5033 Acute on chronic diastolic (congestive) heart failure: Secondary | ICD-10-CM

## 2024-05-25 MED ORDER — APIXABAN 5 MG PO TABS: 5.0000 mg | ORAL_TABLET | Freq: Two times a day (BID) | ORAL | 5 refills | Status: AC

## 2024-06-01 ENCOUNTER — Encounter: Payer: Self-pay | Admitting: Pulmonary Disease

## 2024-06-01 ENCOUNTER — Ambulatory Visit: Admitting: Pulmonary Disease

## 2024-06-01 VITALS — BP 121/83 | HR 87 | Temp 98.7°F | Ht 70.0 in | Wt 250.0 lb

## 2024-06-01 DIAGNOSIS — Z87891 Personal history of nicotine dependence: Secondary | ICD-10-CM | POA: Diagnosis not present

## 2024-06-01 DIAGNOSIS — R0609 Other forms of dyspnea: Secondary | ICD-10-CM

## 2024-06-01 DIAGNOSIS — G4733 Obstructive sleep apnea (adult) (pediatric): Secondary | ICD-10-CM | POA: Diagnosis not present

## 2024-06-01 NOTE — Progress Notes (Signed)
 "  @Patient  ID: Joshua Lara, male    DOB: 07-Mar-1946, 79 y.o.   MRN: [id number]  Chief Complaint  Patient presents with   Shortness of Breath    Pt states since LOV breathing has been good Dry cough but can be Prod cough ( phlegm clear)   Sleep Apnea    DME:  [facility] Pt currently using CPAP machine every night     Referring provider: Montey Lot, PA-C  HPI:   79 y.o. man whom are seen for evaluation of dyspnea on exertion and OSA on BiPAP.  Most recent cardiology note x 3 reviewed.  Overall doing well.  Recent viral infection.  A lot of coughing.  Dry.  Slowly getting better.  Not using Trelegy.  Does not like the taste etc.  Dyspnea not a big problem.  Good adherence to BiPAP, compliance report reviewed.  Doing well.  HPI at initial visit: Previously followed by a pulmonary doctor in [place].  Mainly for OSA.  On auto titrating BiPAP.  2 L bled in.  Had dyspnea for some time.  Worse on inclines or stairs.  Uses oxygen  with exertion as prescribed in the past.  Prescribed Wixela.  Does not seem to help much.  Use albuterol  at night as part of his nighttime routine.  Not sure it helped much during the day.  PFTs in the past have not been consistent with COPD.  Some concern for asthma in the past.  He had progressive cough throughout the spring.  Worsened a bit through the early summer.  Eventually ended having severe chest pain and shortness of breath went to the ED.  Diagnosed with inferior STEMI status post PCI to the RCA.  Since then cough is essentially gone.  Much better.  Most recent chest x-ray 11/2022 reviewed interpreted as clear lungs with mild hyperinflation.  CT angio chest 12/2015 images reviewed interpret as clear lungs bilaterally.  Former smoker, 60-pack-year history.  Quit in the 90s.  Questionaires / Pulmonary Flowsheets:   ACT:      No data to display          MMRC:     No data to display          Epworth:      No data to display           Tests:   FENO:  No results found for: NITRICOXIDE  PFT:    Latest Ref Rng & Units 11/12/2023    1:14 PM 07/03/2014    9:54 AM  PFT Results  FVC-Pre L 2.72  2.86   FVC-Predicted Pre % 68  92   FVC-Post L 2.82  3.03   FVC-Predicted Post % 70  97   Pre FEV1/FVC % % 73  79   Post FEV1/FCV % % 76  77   FEV1-Pre L 1.98  2.26   FEV1-Predicted Pre % 69  99   FEV1-Post L 2.16  2.33   DLCO uncorrected ml/min/mmHg 18.97  20.93   DLCO UNC% % 78  103   DLCO corrected ml/min/mmHg 19.31    DLCO COR %Predicted % 80    DLVA Predicted % 108  115   TLC L 5.21  5.01   TLC % Predicted % 76  96   RV % Predicted % 94  113   2016 personally viewed and interpreted as normal spirometry, lung volumes within normal limits, DLCO within normal limits Spirometry 12/2022 no fixed obstruction, FVC reduced suggestive  of moderate restriction versus air trapping  WALK:      No data to display          Imaging: Personally reviewed and as per EMR discussion this note No results found.   Lab Results: Personally reviewed CBC    Component Value Date/Time   WBC 8.3 12/07/2022 0450   RBC 4.53 12/07/2022 0450   HGB 14.2 12/07/2022 0450   HGB 12.2 (L) 11/04/2016 0334   HCT 41.3 12/07/2022 0450   HCT 37.0 (L) 06/11/2013 1836   PLT 216 12/07/2022 0450   PLT 217 06/11/2013 1836   MCV 91.2 12/07/2022 0450   MCV 92 06/11/2013 1836   MCH 31.3 12/07/2022 0450   MCHC 34.4 12/07/2022 0450   RDW 13.7 12/07/2022 0450   RDW 14.9 (H) 06/11/2013 1836   LYMPHSABS 1.0 12/06/2022 2334   MONOABS 1.1 (H) 12/06/2022 2334   EOSABS 0.5 12/06/2022 2334   BASOSABS 0.1 12/06/2022 2334    BMET    Component Value Date/Time   NA 142 12/25/2023 1621   NA 137 06/11/2013 1836   K 3.8 12/25/2023 1621   K 3.6 06/11/2013 1836   CL 102 12/25/2023 1621   CL 104 06/11/2013 1836   CO2 24 12/25/2023 1621   CO2 26 06/11/2013 1836   GLUCOSE 81 12/25/2023 1621   GLUCOSE 228 (H) 12/09/2022 0334   GLUCOSE 172 (H)  06/11/2013 1836   BUN 15 12/25/2023 1621   BUN 19 (H) 06/11/2013 1836   CREATININE 1.08 12/25/2023 1621   CREATININE 0.97 06/11/2013 1836   CALCIUM  10.2 12/25/2023 1621   CALCIUM  9.8 06/11/2013 1836   GFRNONAA 53 (L) 12/09/2022 0334   GFRNONAA >60 06/11/2013 1836   GFRAA 99 01/30/2020 1354   GFRAA >60 06/11/2013 1836    BNP    Component Value Date/Time   BNP 24.9 11/30/2022 1800    ProBNP    Component Value Date/Time   PROBNP 461 12/12/2022 1358   PROBNP 8.0 05/29/2014 1017    Specialty Problems       Pulmonary Problems   DOE (dyspnea on exertion)   Chest ct 04/2014:  No obvious PE, bibasilar atx, minimal LN Echo 05/2014:  Normal EF R and LHC 05/2014:  Mild cad, normal right heart pressures.  50+ weight gain in one year PFT's 06/2014: FEV1 2.26 (99%), ratio 79, no restriction, mild airtrapping, DLCO normal        PNA (pneumonia)   Acute respiratory failure (HCC)   Dyspnea   OSA on CPAP    Allergies  Allergen Reactions   Bactrim [Sulfamethoxazole-Trimethoprim] Swelling   Ranitidine Hcl Anaphylaxis and Other (See Comments)    Has anaphylactic shock--intubated and in ICU x 4 days   Adalimumab Other (See Comments)   Etanercept Other (See Comments) and Cough    (ENBREL); Patient thinks it contributed to heart issues   Etanercept     Other reaction(s): Cough (ALLERGY/intolerance), Other (See Comments) (ENBREL); Patient thinks it contributed to heart issues   Latex    Other Other (See Comments)    Tested allergic   Sulfa Antibiotics Other (See Comments)   Tape Itching    Can only tolerate bandages for less than 24-hr periods    Tizanidine    Walnut Other (See Comments)    Tested allergic- Black walnuts    Immunization History  Administered Date(s) Administered   Fluad Quad(high Dose 65+) 04/05/2024   Influenza-Unspecified 03/12/2014   PFIZER(Purple Top)SARS-COV-2 Vaccination 06/25/2019, 07/19/2019   Pneumococcal  Conjugate-13 03/12/2013    Past Medical  History:  Diagnosis Date   Arthritis    hands, back, ankles (05/11/2014)   CHF (congestive heart failure) (HCC)    Chronic lower back pain    Compression fracture of lumbar vertebra (HCC)    Coronary artery disease    Daily headache    recently (05/11/2014)   Depression    I might be slight (05/11/2014)   GERD (gastroesophageal reflux disease)    High cholesterol    Hypertension    Hypothyroid    OSA treated with BiPAP    Pneumonia    couple times; last time was 03/2011 (05/11/2014)   Type II diabetes mellitus (HCC)     Tobacco History: Social History   Tobacco Use  Smoking Status Former   Current packs/day: 0.00   Average packs/day: 2.0 packs/day for 30.0 years (60.0 ttl pk-yrs)   Types: Cigarettes   Start date: 05/12/1960   Quit date: 05/12/1990   Years since quitting: 34.0  Smokeless Tobacco Never   Counseling given: Not Answered   Continue to not smoke  Outpatient Encounter Medications as of 06/01/2024  Medication Sig   acetaminophen  (TYLENOL ) 650 MG CR tablet Take 650 mg by mouth every 8 (eight) hours as needed for pain.   apixaban  (ELIQUIS ) 5 MG TABS tablet Take 1 tablet (5 mg total) by mouth 2 (two) times daily.   aspirin  EC 81 MG tablet Take 1 tablet (81 mg total) by mouth daily. Swallow whole.   BD PEN NEEDLE NANO 2ND GEN 32G X 4 MM MISC    clobetasol cream (TEMOVATE) 0.05 % Apply 1 Application topically as needed (breakouts on left arm).   Continuous Glucose Sensor (FREESTYLE LIBRE 2 SENSOR) MISC Inject 1 Device into the skin every 14 (fourteen) days.   Evolocumab  (REPATHA  SURECLICK) 140 MG/ML SOAJ INJECT 140 MG INTO THE SKIN EVERY 14 (FOURTEEN) DAYS.   fenofibrate  (TRICOR ) 145 MG tablet Take 1 tablet (145 mg total) by mouth daily.   fluticasone  (FLONASE ) 50 MCG/ACT nasal spray Place 1 spray into both nostrils 2 (two) times daily.   folic acid  (FOLVITE ) 1 MG tablet Take 1 mg by mouth daily.   HUMULIN R  U-500 KWIKPEN 500 UNIT/ML kwikpen Inject 125-150  Units into the skin 2 (two) times daily.    latanoprost  (XALATAN ) 0.005 % ophthalmic solution Place 1 drop into both eyes in the morning and at bedtime.   levothyroxine  (SYNTHROID , LEVOTHROID) 100 MCG tablet Take 100 mcg by mouth daily.   losartan  (COZAAR ) 25 MG tablet Take 1 tablet (25 mg total) by mouth every evening. (Patient taking differently: Take 25 mg by mouth every evening. AS NEEDED)   nitroGLYCERIN  (NITROSTAT ) 0.4 MG SL tablet Place 1 tablet (0.4 mg total) under the tongue every 5 (five) minutes as needed for chest pain. If you require more than two tablets five minutes apart go to the nearest ER via EMS.   pantoprazole  (PROTONIX ) 40 MG tablet Take 40 mg by mouth daily.   pravastatin  (PRAVACHOL ) 40 MG tablet TAKE 1 TABLET BY MOUTH DAILY AT 6 PM.   Semaglutide , 1 MG/DOSE, 4 MG/3ML SOPN Inject 1 mg into the skin once a week.   albuterol  (VENTOLIN  HFA) 108 (90 Base) MCG/ACT inhaler Inhale 1-2 puffs into the lungs every 4 (four) hours as needed for wheezing or shortness of breath. (Patient not taking: Reported on 06/01/2024)   amoxicillin -clavulanate (AUGMENTIN ) 875-125 MG tablet Take 1 tablet by mouth 2 (two) times daily. (Patient not taking:  Reported on 06/01/2024)   Armodafinil 150 MG tablet Take 150 mg by mouth daily. (Patient not taking: Reported on 06/01/2024)   cyanocobalamin  (VITAMIN B12) 500 MCG tablet Take 500 mcg by mouth daily. (Patient not taking: Reported on 06/01/2024)   Fluticasone -Umeclidin-Vilant (TRELEGY ELLIPTA ) 200-62.5-25 MCG/ACT AEPB Inhale 1 puff into the lungs daily. (Patient not taking: Reported on 06/01/2024)   gabapentin (NEURONTIN) 100 MG capsule Take 100 mg by mouth as needed. (Patient not taking: Reported on 06/01/2024)   guaiFENesin -codeine 100-10 MG/5ML syrup Take 5 mLs by mouth every 6 (six) hours as needed for cough. (Patient not taking: Reported on 06/01/2024)   methotrexate 2.5 MG tablet Take 2.5 mg by mouth once a week. (Patient not taking: Reported on 06/01/2024)    metoprolol  succinate (TOPROL -XL) 25 MG 24 hr tablet Take 1 tablet (25 mg total) by mouth daily. HOLD if systolic blood pressure (top number) is less than 100 and/ or heart rate is less than 55. (Patient not taking: Reported on 06/01/2024)   Multiple Vitamins-Minerals (CENTRUM SILVER PO) Take 1 tablet by mouth daily. (Patient not taking: Reported on 06/01/2024)   Vitamin D, Ergocalciferol, (DRISDOL) 1.25 MG (50000 UNIT) CAPS capsule Take 50,000 Units by mouth once a week. (Patient not taking: Reported on 06/01/2024)   No facility-administered encounter medications on file as of 06/01/2024.     Review of Systems  Review of Systems  N/a Physical Exam  BP 121/83   Pulse 87   Temp 98.7 F (37.1 C) (Oral)   Ht 5' 10 (1.778 m) Comment: PER PATIENT  Wt 250 lb (113.4 kg)   SpO2 97% Comment: on RA  BMI 35.87 kg/m   Wt Readings from Last 5 Encounters:  06/01/24 250 lb (113.4 kg)  05/03/24 249 lb (112.9 kg)  03/30/24 251 lb (113.9 kg)  01/29/24 250 lb (113.4 kg)  12/25/23 246 lb (111.6 kg)    BMI Readings from Last 5 Encounters:  06/01/24 35.87 kg/m  05/03/24 35.73 kg/m  03/30/24 36.01 kg/m  01/29/24 35.87 kg/m  01/01/24 35.30 kg/m     Physical Exam General: In chair no distress Eyes: No icterus Neck: No JVP Pulmonary: Clear bilaterally, diminished throughout Cardiovascular: Regular rate and rhythm, no murmur   Assessment & Plan:   Dyspnea on exertion: Suspect multifactorial.  Investigated by pulmonary in the past without pulmonary culprit.  Prior spirometry with reduced FVC, suspect element of restriction possibly related to habitus.  Prior PFTs 2016 otherwise normal.  History of STEMI, coronary disease.  Suspect cardiac drivers as well.  No real improvement with Advair.    Asthma felt quite possible, eosinophil mildly elevated in the past as well.   FVC reduction could be related to air trapping otherwise felt more likely related to extraparenchymal restriction, habitus.   Cross-sectional imaging without ILD.  Overall dyspnea has improved with time as we get more remote from cardiac event.  No significant dyspnea now.  Breathing pattern heavy breathing need to rest somewhat for the last 10 to 15 years per family report.  Serial cross-sectional imaging is clear.  Repeat PFT 11/2023 stable.  OSA on autotitrating BIPAP: nocturnal dyspnea improved in terms of frequency and severity after PCI for STEMI.  Compliance report reviewed today with excellent adherence.  Using nightly, average use proximately 8 hours a day.  AHI 4.1.  He is benefiting clinically from BiPAP, encouraged to continue.   Return in about 6 months (around 11/29/2024) for f/u Dr. Annella.   Joshua JONELLE Annella, MD 06/01/2024  "

## 2024-06-02 ENCOUNTER — Other Ambulatory Visit (HOSPITAL_BASED_OUTPATIENT_CLINIC_OR_DEPARTMENT_OTHER): Payer: Self-pay | Admitting: Nurse Practitioner

## 2024-06-02 DIAGNOSIS — K7581 Nonalcoholic steatohepatitis (NASH): Secondary | ICD-10-CM

## 2024-06-02 DIAGNOSIS — K7469 Other cirrhosis of liver: Secondary | ICD-10-CM

## 2024-06-03 ENCOUNTER — Ambulatory Visit

## 2024-06-03 DIAGNOSIS — I4819 Other persistent atrial fibrillation: Secondary | ICD-10-CM | POA: Diagnosis not present

## 2024-06-04 ENCOUNTER — Other Ambulatory Visit: Payer: Self-pay | Admitting: Cardiology

## 2024-06-04 DIAGNOSIS — E119 Type 2 diabetes mellitus without complications: Secondary | ICD-10-CM

## 2024-06-04 DIAGNOSIS — I5022 Chronic systolic (congestive) heart failure: Secondary | ICD-10-CM

## 2024-06-04 DIAGNOSIS — Z789 Other specified health status: Secondary | ICD-10-CM

## 2024-06-04 DIAGNOSIS — E7800 Pure hypercholesterolemia, unspecified: Secondary | ICD-10-CM

## 2024-06-06 LAB — CUP PACEART REMOTE DEVICE CHECK
Date Time Interrogation Session: 20260124082110
Implantable Pulse Generator Implant Date: 20251223

## 2024-06-06 NOTE — Progress Notes (Signed)
 Remote Loop Recorder Transmission

## 2024-06-07 ENCOUNTER — Ambulatory Visit: Payer: Self-pay | Admitting: Cardiovascular Disease

## 2024-06-09 ENCOUNTER — Other Ambulatory Visit (HOSPITAL_BASED_OUTPATIENT_CLINIC_OR_DEPARTMENT_OTHER): Admitting: Radiology

## 2024-06-20 ENCOUNTER — Other Ambulatory Visit (HOSPITAL_BASED_OUTPATIENT_CLINIC_OR_DEPARTMENT_OTHER): Admitting: Radiology

## 2024-07-01 ENCOUNTER — Ambulatory Visit (INDEPENDENT_AMBULATORY_CARE_PROVIDER_SITE_OTHER): Admitting: Otolaryngology

## 2024-07-04 ENCOUNTER — Ambulatory Visit

## 2024-08-04 ENCOUNTER — Ambulatory Visit

## 2024-09-04 ENCOUNTER — Ambulatory Visit
